# Patient Record
Sex: Female | Born: 1940
Health system: Southern US, Community
[De-identification: ages and names within clinical notes are randomized; demographics above are authoritative.]

## PROBLEM LIST (undated history)

## (undated) DIAGNOSIS — E119 Type 2 diabetes mellitus without complications: Secondary | ICD-10-CM

## (undated) DIAGNOSIS — K219 Gastro-esophageal reflux disease without esophagitis: Secondary | ICD-10-CM

## (undated) DIAGNOSIS — I10 Essential (primary) hypertension: Secondary | ICD-10-CM

## (undated) HISTORY — PX: ABDOMINAL HYSTERECTOMY: SHX81

## (undated) HISTORY — PX: OTHER SURGICAL HISTORY: SHX169

## (undated) HISTORY — PX: CHOLECYSTECTOMY: SHX55

## (undated) HISTORY — DX: Type 2 diabetes mellitus without complications: E11.9

## (undated) HISTORY — DX: Gastro-esophageal reflux disease without esophagitis: K21.9

---

## 2005-01-28 ENCOUNTER — Ambulatory Visit: Payer: Self-pay | Admitting: Orthopedic Surgery

## 2005-07-14 ENCOUNTER — Ambulatory Visit: Payer: Self-pay | Admitting: Orthopedic Surgery

## 2007-10-30 ENCOUNTER — Ambulatory Visit: Payer: Self-pay | Admitting: Orthopedic Surgery

## 2007-10-30 DIAGNOSIS — M25519 Pain in unspecified shoulder: Secondary | ICD-10-CM

## 2008-01-01 ENCOUNTER — Ambulatory Visit: Payer: Self-pay | Admitting: Orthopedic Surgery

## 2012-07-12 ENCOUNTER — Ambulatory Visit (INDEPENDENT_AMBULATORY_CARE_PROVIDER_SITE_OTHER): Payer: Medicare Other | Admitting: Orthopedic Surgery

## 2012-07-12 ENCOUNTER — Encounter: Payer: Self-pay | Admitting: Orthopedic Surgery

## 2012-07-12 VITALS — BP 110/58 | Ht 65.0 in | Wt 174.2 lb

## 2012-07-12 DIAGNOSIS — Z8739 Personal history of other diseases of the musculoskeletal system and connective tissue: Secondary | ICD-10-CM

## 2012-07-12 DIAGNOSIS — M67919 Unspecified disorder of synovium and tendon, unspecified shoulder: Secondary | ICD-10-CM

## 2012-07-12 DIAGNOSIS — M7580 Other shoulder lesions, unspecified shoulder: Secondary | ICD-10-CM

## 2012-07-12 MED ORDER — HYDROCODONE-ACETAMINOPHEN 5-325 MG PO TABS: 1.0000 | ORAL_TABLET | ORAL | Status: DC | PRN

## 2012-07-12 NOTE — Patient Instructions (Addendum)
You have received a steroid shot. 15% of patients experience increased pain at the injection site with in the next 24 hours. This is best treated with ice and tylenol extra strength 2 tabs every 8 hours. If you are still having pain please call the office.   Start exercise in 1 week

## 2012-07-12 NOTE — Progress Notes (Signed)
  Subjective:    Kathryn [name] is a 72 y.o. female who presents with bilateral shoulder pain. The symptoms began several weeks ago. Aggravating factors: no known event. Pain is located diffusely throughout the shoulder. Discomfort is described as sharp/stabbing and throbbing. Symptoms are exacerbated by repetitive movements, overhead movements and lying on the shoulder. Evaluation to date: none. Therapy to date includes: nothing specific.  The following portions of the patient's history were reviewed and updated as appropriate: allergies, current medications, past family history, past medical history, past social history, past surgical history and problem list.  Review of Systems A comprehensive review of systems was negative except for: cough and heartburn    Objective:    BP 110/58  Ht 5\' 5"  (1.651 m)  Wt 174 lb 3.2 oz (79.017 kg)  BMI 28.99 kg/m2 Physical Exam(12) GENERAL: normal development   CDV: pulses are normal   Skin: normal  Lymph: nodes were not palpable/normal  Psychiatric: awake, alert and oriented  Neuro: normal sensation  RIGHT shoulder, a full passive and active range of motion with painful forward elevation. Rotator cuff intact. Shoulder stable. Skin intact.  LEFT shoulder decreased internal rotation, decreased external rotation, forward elevation, active only 100, painful impingement sign bilaterally, LEFT shoulder, stable cuff intact on the LEFT as well. Skin intact.  Normal ambulation without balance disturbance. No pathologic reflexes  Assessment:    Bilateral rotator cuff tendinitis    Plan:    Natural history and expected course discussed. Questions answered. Gentle ROM exercises. Shoulder injection. See procedure note.  Subacromial Shoulder Injection Procedure Note  Shoulder Injection Procedure Note   Pre-operative Diagnosis: right  RC Syndrome  Post-operative Diagnosis: same  Indications: pain   Anesthesia: ethyl chloride   Procedure  Details   Verbal consent was obtained for the procedure. The shoulder was prepped withalcohol and the skin was anesthetized. A 20 gauge needle was advanced into the subacromial space through posterior approach without difficulty  The space was then injected with 3 ml 1% lidocaine and 1 ml of depomedrol. The injection site was cleansed with isopropyl alcohol and a dressing was applied.  Complications:  None; patient tolerated the procedure well.  Subacromial Shoulder Injection Procedure Note  Pre-operative Diagnosis: left RC Syndrome  Post-operative Diagnosis: same  Indications: pain   Anesthesia: ethyl chloride   Procedure Details   Verbal consent was obtained for the procedure. The shoulder was prepped withalcohol and the skin was anesthetized. A 20 gauge needle was advanced into the subacromial space through posterior approach without difficulty  The space was then injected with 3 ml 1% lidocaine and 1 ml of depomedrol. The injection site was cleansed with isopropyl alcohol and a dressing was applied.  Complications:  None; patient tolerated the procedure well.

## 2014-12-04 DIAGNOSIS — H11823 Conjunctivochalasis, bilateral: Secondary | ICD-10-CM | POA: Insufficient documentation

## 2014-12-04 DIAGNOSIS — H2513 Age-related nuclear cataract, bilateral: Secondary | ICD-10-CM | POA: Insufficient documentation

## 2015-04-25 DIAGNOSIS — E8809 Other disorders of plasma-protein metabolism, not elsewhere classified: Secondary | ICD-10-CM | POA: Insufficient documentation

## 2015-04-27 DIAGNOSIS — Z808 Family history of malignant neoplasm of other organs or systems: Secondary | ICD-10-CM | POA: Insufficient documentation

## 2015-04-27 DIAGNOSIS — Z800 Family history of malignant neoplasm of digestive organs: Secondary | ICD-10-CM | POA: Insufficient documentation

## 2015-06-26 ENCOUNTER — Ambulatory Visit (INDEPENDENT_AMBULATORY_CARE_PROVIDER_SITE_OTHER): Payer: Medicare Other | Admitting: Orthopedic Surgery

## 2015-06-26 ENCOUNTER — Ambulatory Visit: Payer: Medicare Other

## 2015-06-26 ENCOUNTER — Ambulatory Visit (INDEPENDENT_AMBULATORY_CARE_PROVIDER_SITE_OTHER): Payer: Medicare Other

## 2015-06-26 VITALS — BP 135/87 | Ht 65.0 in | Wt 129.0 lb

## 2015-06-26 DIAGNOSIS — M25512 Pain in left shoulder: Secondary | ICD-10-CM

## 2015-06-26 DIAGNOSIS — M25511 Pain in right shoulder: Secondary | ICD-10-CM | POA: Diagnosis not present

## 2015-06-26 DIAGNOSIS — M7502 Adhesive capsulitis of left shoulder: Secondary | ICD-10-CM | POA: Diagnosis not present

## 2015-06-26 DIAGNOSIS — M7501 Adhesive capsulitis of right shoulder: Secondary | ICD-10-CM | POA: Diagnosis not present

## 2015-06-26 MED ORDER — TRAMADOL HCL 50 MG PO TABS: 50.0000 mg | ORAL_TABLET | Freq: Four times a day (QID) | ORAL | Status: DC | PRN

## 2015-06-26 NOTE — Patient Instructions (Addendum)
Call [facility] to schedule therapy visits  Joint Injection Care After Refer to this sheet in the next few days. These instructions provide you with information on caring for yourself after you have had a joint injection. Your caregiver also may give you more specific instructions. Your treatment has been planned according to current medical practices, but problems sometimes occur. Call your caregiver if you have any problems or questions after your procedure. After any type of joint injection, it is not uncommon to experience:  Soreness, swelling, or bruising around the injection site.  Mild numbness, tingling, or weakness around the injection site caused by the numbing medicine used before or with the injection. It also is possible to experience the following effects associated with the specific agent after injection:  Iodine-based contrast agents:  Allergic reaction (itching, hives, widespread redness, and swelling beyond the injection site).  Corticosteroids (These effects are rare.):  Allergic reaction.  Increased blood sugar levels (If you have diabetes and you notice that your blood sugar levels have increased, notify your caregiver).  Increased blood pressure levels.  Mood swings.  Hyaluronic acid in the use of viscosupplementation.  Temporary heat or redness.  Temporary rash and itching.  Increased fluid accumulation in the injected joint. These effects all should resolve within a day after your procedure.  HOME CARE INSTRUCTIONS  Limit yourself to light activity the day of your procedure. Avoid lifting heavy objects, bending, stooping, or twisting.  Take prescription or over-the-counter pain medication as directed by your caregiver.  You may apply ice to your injection site to reduce pain and swelling the day of your procedure. Ice may be applied 03-04 times:  Put ice in a plastic bag.  Place a towel between your skin and the bag.  Leave the ice on for no  longer than 15-20 minutes each time. SEEK IMMEDIATE MEDICAL CARE IF:   Pain and swelling get worse rather than better or extend beyond the injection site.  Numbness does not go away.  Blood or fluid continues to leak from the injection site.  You have chest pain.  You have swelling of your face or tongue.  You have trouble breathing or you become dizzy.  You develop a fever, chills, or severe tenderness at the injection site that last longer than 1 day. MAKE SURE YOU:  Understand these instructions.  Watch your condition.  Get help right away if you are not doing well or if you get worse. Document Released: 02/25/2011 Document Revised: 09/06/2011 Document Reviewed: 02/25/2011 [facility] Patient Information 2015 [place], [place]. This information is not intended to replace advice given to you by your health care provider. Make sure you discuss any questions you have with your health care provider.

## 2015-06-26 NOTE — Progress Notes (Signed)
Chief Complaint  Patient presents with  . Shoulder Pain    bilateral shoulder pain    74 years old female bilateral shoulder pain I saw her back in January we injected one of her shoulder she presents back with stiffness pains loss of motion no trauma with increased pain with overhead activity  Review of systems negative neuro symptoms negative fever negative chills negative other musculoskeletal complaints  BP 135/87 mmHg  Ht 5\' 5"  (1.651 m)  Wt 129 lb (58.514 kg)  BMI 21.47 kg/m2 She is awake alert and oriented 3 today her mood and affect are normal she is well-groomed. She walks and normally. Bilateral shoulders are examined  She has limited flexion of 110 active and passive she has limited external rotation 30 active and passive both shoulders remain stable-no weakness in the rotator cuff she does have crepitance and tenderness around the left shoulder as well as tenderness around the right shoulder  Both arms and the skin is normal with good pulses and normal sensation  X-rays of both shoulders show no fracture dislocation or bony abnormality other than some spurring around the acromion and perhaps a mild before meals joint arthritis  Impression adhesive capsulitis with possible impingement syndrome  Injectables shoulders and the therapy follow-up 7 weeks  Procedure note the subacromial injection shoulder left   Verbal consent was obtained to inject the  Left   Shoulder  Timeout was completed to confirm the injection site is a subacromial space of the  left  shoulder  Medication used Depo-Medrol 40 mg and lidocaine 1% 3 cc  Anesthesia was provided by ethyl chloride  The injection was performed in the left  posterior subacromial space. After pinning the skin with alcohol and anesthetized the skin with ethyl chloride the subacromial space was injected using a 20-gauge needle. There were no complications  Sterile dressing was applied.   Procedure note the subacromial  injection shoulder RIGHT  Verbal consent was obtained to inject the  RIGHT   Shoulder  Timeout was completed to confirm the injection site is a subacromial space of the  RIGHT  shoulder   Medication used Depo-Medrol 40 mg and lidocaine 1% 3 cc  Anesthesia was provided by ethyl chloride  The injection was performed in the RIGHT  posterior subacromial space. After pinning the skin with alcohol and anesthetized the skin with ethyl chloride the subacromial space was injected using a 20-gauge needle. There were no complications  Sterile dressing was applied.

## 2015-08-14 ENCOUNTER — Ambulatory Visit: Payer: Medicare Other | Admitting: Orthopedic Surgery

## 2015-08-18 ENCOUNTER — Ambulatory Visit (INDEPENDENT_AMBULATORY_CARE_PROVIDER_SITE_OTHER): Payer: Medicare Other | Admitting: Orthopedic Surgery

## 2015-08-18 ENCOUNTER — Encounter: Payer: Self-pay | Admitting: Orthopedic Surgery

## 2015-08-18 VITALS — BP 152/78 | Ht 65.0 in | Wt 133.0 lb

## 2015-08-18 DIAGNOSIS — M7501 Adhesive capsulitis of right shoulder: Secondary | ICD-10-CM

## 2015-08-18 DIAGNOSIS — M25511 Pain in right shoulder: Secondary | ICD-10-CM

## 2015-08-18 DIAGNOSIS — M25512 Pain in left shoulder: Secondary | ICD-10-CM | POA: Diagnosis not present

## 2015-08-18 DIAGNOSIS — M75101 Unspecified rotator cuff tear or rupture of right shoulder, not specified as traumatic: Secondary | ICD-10-CM | POA: Diagnosis not present

## 2015-08-18 MED ORDER — DIAZEPAM 10 MG PO TABS: ORAL_TABLET | ORAL | Status: DC

## 2015-08-18 NOTE — Patient Instructions (Signed)
We will schedule MRI for you and call you with appointment 

## 2015-08-18 NOTE — Addendum Note (Signed)
Addended by: Diamantina Monks on: 08/18/2015 02:44 PM   Modules accepted: Orders

## 2015-08-18 NOTE — Progress Notes (Signed)
Patient ID: Kathryn [name], female   DOB: 26-Mar-1941, 75 y.o.   MRN: [id number]  Chief Complaint  Patient presents with  . Follow-up    follow up bilateral shoulders s/p therapy    BP 152/78 mmHg  Ht  (1.651 m)  Wt 133 lb (60.328 kg)  BMI 22.13 kg/m2  Bilateral shoulder pain with impingement and possible adhesive capsulitis status post physical therapy both shoulders. Left shoulder responded very well right shoulder still stiff and painful.  Review of Systems  Constitutional: Negative for fever and chills.  Neurological: Negative for tingling.    Reexamination of the left shoulder shows 120 of elevation still has only 35 external rotation but on the right were limited to 0 external rotation and she has to assist the right arm to get it above her head    ASSESSMENT AND PLAN   Probable rotator cuff tear recommend MRI right shoulder with some Valium prior to MRI to control her mild claustrophobia

## 2015-08-19 ENCOUNTER — Telehealth: Payer: Self-pay | Admitting: *Deleted

## 2015-08-19 NOTE — Telephone Encounter (Signed)
SPOKE WITH [employer]- NO AUTH REQUIRED PER DEBRA O. CASE # [id number]  MRI 08/29/15  5:45P @ [facility]  FOLLOW UP IN OFFICE 09/05/15 8:50A  PATIENT AWARE OF DATES

## 2015-08-29 ENCOUNTER — Ambulatory Visit (HOSPITAL_COMMUNITY)
Admission: RE | Admit: 2015-08-29 | Discharge: 2015-08-29 | Disposition: A | Discharge status: Home / Self Care | Payer: Medicare Other | Source: Ambulatory Visit | Attending: Orthopedic Surgery | Admitting: Orthopedic Surgery

## 2015-08-29 DIAGNOSIS — M62511 Muscle wasting and atrophy, not elsewhere classified, right shoulder: Secondary | ICD-10-CM | POA: Insufficient documentation

## 2015-08-29 DIAGNOSIS — M75101 Unspecified rotator cuff tear or rupture of right shoulder, not specified as traumatic: Secondary | ICD-10-CM | POA: Insufficient documentation

## 2015-09-05 ENCOUNTER — Ambulatory Visit (INDEPENDENT_AMBULATORY_CARE_PROVIDER_SITE_OTHER): Payer: Medicare Other | Admitting: Orthopedic Surgery

## 2015-09-05 VITALS — BP 139/81

## 2015-09-05 DIAGNOSIS — M75101 Unspecified rotator cuff tear or rupture of right shoulder, not specified as traumatic: Secondary | ICD-10-CM | POA: Diagnosis not present

## 2015-09-05 MED ORDER — TRAMADOL-ACETAMINOPHEN 37.5-325 MG PO TABS: 1.0000 | ORAL_TABLET | ORAL | Status: DC | PRN

## 2015-09-05 NOTE — Progress Notes (Signed)
Status post MRI right shoulder  MRI has been reviewed and is report reviewed together  Patient has weakness and pain is due to paralysis of the right shoulder but has shoulder flexion 120  Review of systems no neurologic symptoms or neck pain  Currently in therapy at their a sports with some improvement on Ultram  BP 139/81 mmHg 5 foot 5 inches 133 pounds  Awake alert and oriented 3. Mood affect normal. Gait station normal.  No swelling or tenderness around the shoulder flexion 120 stable shoulder strength weakness in the rotator cuff tendon super spinatus 4-5 skin intact neurovascular exam normal.  MRI shows a repairable rotator cuff tear  IMPRESSION: 1. Large full-thickness retracted rotator cuff tear. 2. Diffuse and fairly marked fatty atrophy of the supraspinatus and infraspinatus muscles. 3. Intact Lenoir head biceps tendon and grossly normal glenoid labrum. 4. Moderate to advanced AC joint degenerative changes with mild lateral downsloping and undersurface spurring change from the acromion. 5. Moderate glenohumeral joint degenerative changes. 6. Diffuse edema like signal changes in the shoulder musculature may represent some type of diffuse myositis such as polymyositis or medication induced myositis.     Electronically Signed   By: Rudie MeyerP.  Gallerani M.D.   On: 08/29/2015 18:12   Plan is for continued physical therapy 1 month  Come back for reevaluation  Switch from all tramadol Ultracet

## 2015-09-05 NOTE — Patient Instructions (Signed)
[facility]- [phone]

## 2015-10-13 ENCOUNTER — Ambulatory Visit (INDEPENDENT_AMBULATORY_CARE_PROVIDER_SITE_OTHER): Payer: Medicare Other | Admitting: Orthopedic Surgery

## 2015-10-13 VITALS — BP 162/77 | HR 84 | Ht 64.0 in | Wt 133.0 lb

## 2015-10-13 DIAGNOSIS — M75101 Unspecified rotator cuff tear or rupture of right shoulder, not specified as traumatic: Secondary | ICD-10-CM | POA: Diagnosis not present

## 2015-10-13 NOTE — Progress Notes (Signed)
Patient ID: Kathryn PlantsShirley M Wesche, female   DOB: 04-19-1941, 75 y.o.   MRN: [id number]  Chief Complaint  Patient presents with  . Follow-up    Rght shoulder s/p therapy    HPI-Status post MRI right shoulder  MRI has been reviewed and is report reviewed together  Patient has weakness and pain is due to paralysis of the right shoulder but has shoulder flexion 120  The patient is not having any significant pain still has weakness but can still elevate her arm to get her hand to the top of her head and she thinks that the therapy is making things a lot better  ROS Review of systems no neurologic symptoms or neck pain  BP 162/77 mmHg  Pulse 84  Ht 5\' 4"  (1.626 m)  Wt 133 lb (60.328 kg)  BMI 22.82 kg/m2  Physical Exam  Constitutional: She is oriented to person, place, and time. She appears well-developed and well-nourished. No distress.  Cardiovascular: Normal rate and intact distal pulses.   Neurological: She is alert and oriented to person, place, and time. She has normal reflexes. She exhibits normal muscle tone. Coordination normal.  Skin: Skin is warm and dry. No rash noted. She is not diaphoretic. No erythema. No pallor.  Psychiatric: She has a normal mood and affect. Her behavior is normal. Judgment and thought content normal.    Ortho Exam  Weakness of supraspinatus tendon in abduction as well as flexion but flexion actively is 120. No instability is noted.  ASSESSMENT AND PLAN   The patient does not request any other intervention she's happy with her shoulder the weight is at the moment and she will follow-up with us as needed after completing her physical therapy

## 2016-03-02 DIAGNOSIS — H903 Sensorineural hearing loss, bilateral: Secondary | ICD-10-CM | POA: Insufficient documentation

## 2016-12-08 ENCOUNTER — Encounter (INDEPENDENT_AMBULATORY_CARE_PROVIDER_SITE_OTHER): Payer: Self-pay | Admitting: Orthopaedic Surgery

## 2016-12-08 ENCOUNTER — Ambulatory Visit (INDEPENDENT_AMBULATORY_CARE_PROVIDER_SITE_OTHER): Payer: Medicare Other | Admitting: Orthopaedic Surgery

## 2016-12-08 VITALS — BP 150/96 | HR 86 | Ht 63.0 in | Wt 170.0 lb

## 2016-12-08 DIAGNOSIS — M65332 Trigger finger, left middle finger: Secondary | ICD-10-CM | POA: Diagnosis not present

## 2016-12-08 MED ORDER — METHYLPREDNISOLONE ACETATE 40 MG/ML IJ SUSP
20.0000 mg | INTRAMUSCULAR | Status: AC | PRN
  Administered 2016-12-08: 20 mg

## 2016-12-08 MED ORDER — LIDOCAINE HCL (PF) 1 % IJ SOLN
0.5000 mL | INTRAMUSCULAR | Status: AC | PRN
  Administered 2016-12-08: .5 mL

## 2016-12-08 NOTE — Progress Notes (Signed)
Office Visit Note   Patient: Kathryn [name]           Date of Birth: 1941-02-14           MRN: [id number] Visit Date: 12/08/2016              Requested by: Kathryn [name], Kathryn B., MD [address] [place], [place] [id number] PCP: Kathryn [name], Kathryn B., MD   Assessment & Plan: Visit Diagnoses:  1. Trigger finger, left middle finger     Plan: Discussed treatment options including cortisone injection and surgery. Kathryn [name] would like to try the injection first with splinting. Return to office next 4-6 weeks if no improvement or with recurrence  Follow-Up Instructions: Return if symptoms worsen or fail to improve.   Orders:  No orders of the defined types were placed in this encounter.  No orders of the defined types were placed in this encounter.     Procedures: Hand/UE Inj Date/Time: 12/08/2016 3:06 PM Performed by: Kathryn [name], Kathryn [name] Authorized by: Kathryn [name], Kathryn [name]   Consent Given by:  Patient Indications:  Pain Condition: trigger finger   Location:  Kathryn [name] finger Site:  L Kathryn [name] Needle Size:  27 G Approach:  Volar Ultrasound Guidance: No   Medications:  0.5 mL lidocaine (PF) 1 %; 20 mg methylPREDNISolone acetate 40 MG/ML     Clinical Data: No additional findings.   Subjective: No chief complaint on file. Kathryn [name] has been experiencing triggering of both right and left Godwin fingers over period of several months. She is presently not having much trouble on the right but continues to have an issue on the left. The finger will catch when she wakes up in the morning with a painful area on the palmar aspect of her hand near the metacarpal phalangeal joint. She denies numbness or tingling or injury or trauma  HPI  Review of Systems   Objective: Vital Signs: BP (!) 150/96   Pulse 86   Ht 5\' 3"  (1.6 m)   Wt 170 lb (77.1 kg)   BMI 30.11 kg/m   Physical Exam  Ortho Exam left hand without any swelling. Skin intact. Neurovascular exam intact. No carpal tunnel symptoms.  Kathryn [name] nodule on the palmar aspect of the hand over the metacarpal phalangeal joint of the Kathryn [name] finger. Patient is able to actively The finger consistent with trigger finger.  Specialty Comments:  No specialty comments available.  Imaging: No results found.   PMFS History: Patient Active Problem List   Diagnosis Date Noted  . H/O rotator cuff syndrome 07/12/2012  . Rotator cuff tendinitis 07/12/2012  . SHOULDER PAIN 10/30/2007   Past Medical History:  Diagnosis Date  . Acid reflux   . Diabetes (HCC)     Family History  Problem Relation Age of Onset  . Cancer Unknown   . Diabetes Unknown   . Arthritis Unknown     Past Surgical History:  Procedure Laterality Date  . CHOLECYSTECTOMY    . Histosalpingogram     Social History   Occupational History  . Not on file.   Social History Main Topics  . Smoking status: Never Smoker  . Smokeless tobacco: Never Used  . Alcohol use No  . Drug use: No  . Sexual activity: No     Kathryn BatmanPeter [name] Gracey Tolle, MD   Note - This record has been created using [employer] software.  Chart creation errors have been sought, but may not always  have been located. Such creation  errors do not reflect on  the standard of medical care.

## 2017-08-03 ENCOUNTER — Ambulatory Visit (INDEPENDENT_AMBULATORY_CARE_PROVIDER_SITE_OTHER): Payer: Medicare Other | Admitting: Orthopaedic Surgery

## 2017-08-03 ENCOUNTER — Encounter (INDEPENDENT_AMBULATORY_CARE_PROVIDER_SITE_OTHER): Payer: Self-pay | Admitting: Orthopaedic Surgery

## 2017-08-03 VITALS — BP 161/88 | HR 85 | Resp 16 | Ht 64.0 in | Wt 170.0 lb

## 2017-08-03 DIAGNOSIS — M79642 Pain in left hand: Secondary | ICD-10-CM

## 2017-08-03 MED ORDER — METHYLPREDNISOLONE ACETATE 40 MG/ML IJ SUSP
20.0000 mg | INTRAMUSCULAR | Status: AC | PRN
  Administered 2017-08-03: 20 mg

## 2017-08-03 MED ORDER — LIDOCAINE HCL 1 % IJ SOLN
0.5000 mL | INTRAMUSCULAR | Status: AC | PRN
  Administered 2017-08-03: .5 mL

## 2017-08-03 NOTE — Progress Notes (Signed)
Office Visit Note   Patient: Kathryn [name]           Date of Birth: [birth year]           MRN: [id number] Visit Date: 08/03/2017              Requested by: Louie Bostonapper, David B., MD [address] [place], [place] [id number] PCP: Louie Bostonapper, David B., MD   Assessment & Plan: Visit Diagnoses:  1. Pain of left hand     Plan: Recurrent triggering left Morado finger. We will reinject. Kathryn [name] is not interested in surgery  Follow-Up Instructions: Return if symptoms worsen or fail to improve.   Orders:  Orders Placed This Encounter  Procedures  . Hand/UE Inj: L Olivar A1   No orders of the defined types were placed in this encounter.     Procedures: Hand/UE Inj: L Cassaro A1 for trigger finger on 08/03/2017 10:05 AM Details: 27 G needle, volar approach Medications: 0.5 mL lidocaine 1 %; 20 mg methylPREDNISolone acetate 40 MG/ML      Clinical Data: No additional findings.   Subjective: Chief Complaint  Patient presents with  . Left Hand - Pain    Kathryn [name] has been experiencing triggering of L Dudziak finger X 1 month. Injection in 11/2016 In the am wakes with a painful area on the palmar aspect of her hand near the metacarpal phalangeal joint  Trigger finger injection same finger in July "made a big difference". Has had recurrent triggering  HPI  Review of Systems  Constitutional: Negative for chills, fatigue and fever.  Eyes: Negative for itching.  Respiratory: Negative for chest tightness and shortness of breath.   Cardiovascular: Negative for chest pain, palpitations and leg swelling.  Gastrointestinal: Negative for blood in stool, constipation and diarrhea.  Endocrine: Negative for polyuria.  Genitourinary: Negative for dysuria.  Musculoskeletal: Positive for joint swelling. Negative for back pain, neck pain and neck stiffness.  Allergic/Immunologic: Negative for immunocompromised state.  Neurological: Negative for dizziness and numbness.  Hematological: Does not bruise/bleed  easily.  Psychiatric/Behavioral: The patient is not nervous/anxious.      Objective: Vital Signs: BP (!) 161/88   Pulse 85   Resp 16   Ht 5\' 4"  (1.626 m)   Wt 170 lb (77.1 kg)   BMI 29.18 kg/m   Physical Exam  Ortho Exam awake alert and oriented 3. Comfortable sitting. Exam of the left Dack finger demonstrates a painful nodule on the palmar aspect near the metacarpal phalangeal joint. She could not actively trigger the finger. No swelling of the digit. Neurovascular exam intact. Full extension across the IP joint and MP joint.  Specialty Comments:  No specialty comments available.  Imaging: No results found.   PMFS History: Patient Active Problem List   Diagnosis Date Noted  . H/O rotator cuff syndrome 07/12/2012  . Rotator cuff tendinitis 07/12/2012  . SHOULDER PAIN 10/30/2007   Past Medical History:  Diagnosis Date  . Acid reflux   . Diabetes (HCC)     Family History  Problem Relation Age of Onset  . Cancer Unknown   . Diabetes Unknown   . Arthritis Unknown     Past Surgical History:  Procedure Laterality Date  . CHOLECYSTECTOMY    . Histosalpingogram     Social History   Occupational History  . Not on file  Tobacco Use  . Smoking status: Never Smoker  . Smokeless tobacco: Never Used  Substance and Sexual Activity  .  Alcohol use: No  . Drug use: No  . Sexual activity: No

## 2018-03-11 DIAGNOSIS — B370 Candidal stomatitis: Secondary | ICD-10-CM | POA: Diagnosis not present

## 2018-03-11 DIAGNOSIS — Z79899 Other long term (current) drug therapy: Secondary | ICD-10-CM | POA: Diagnosis not present

## 2018-03-11 DIAGNOSIS — N390 Urinary tract infection, site not specified: Secondary | ICD-10-CM | POA: Diagnosis not present

## 2018-03-11 DIAGNOSIS — N19 Unspecified kidney failure: Secondary | ICD-10-CM | POA: Diagnosis not present

## 2018-03-11 DIAGNOSIS — E878 Other disorders of electrolyte and fluid balance, not elsewhere classified: Secondary | ICD-10-CM | POA: Diagnosis not present

## 2018-03-11 DIAGNOSIS — K565 Intestinal adhesions [bands], unspecified as to partial versus complete obstruction: Secondary | ICD-10-CM | POA: Diagnosis not present

## 2018-03-11 DIAGNOSIS — Z7982 Long term (current) use of aspirin: Secondary | ICD-10-CM | POA: Diagnosis not present

## 2018-03-11 DIAGNOSIS — N179 Acute kidney failure, unspecified: Secondary | ICD-10-CM | POA: Diagnosis not present

## 2018-03-11 DIAGNOSIS — Z7984 Long term (current) use of oral hypoglycemic drugs: Secondary | ICD-10-CM | POA: Diagnosis not present

## 2018-03-11 DIAGNOSIS — E1129 Type 2 diabetes mellitus with other diabetic kidney complication: Secondary | ICD-10-CM | POA: Diagnosis not present

## 2018-03-11 DIAGNOSIS — E119 Type 2 diabetes mellitus without complications: Secondary | ICD-10-CM | POA: Diagnosis not present

## 2018-03-11 DIAGNOSIS — K56609 Unspecified intestinal obstruction, unspecified as to partial versus complete obstruction: Secondary | ICD-10-CM | POA: Diagnosis not present

## 2018-03-11 DIAGNOSIS — E039 Hypothyroidism, unspecified: Secondary | ICD-10-CM | POA: Diagnosis not present

## 2018-03-11 DIAGNOSIS — J208 Acute bronchitis due to other specified organisms: Secondary | ICD-10-CM | POA: Diagnosis not present

## 2018-03-11 DIAGNOSIS — J9811 Atelectasis: Secondary | ICD-10-CM | POA: Diagnosis not present

## 2018-03-11 DIAGNOSIS — E860 Dehydration: Secondary | ICD-10-CM | POA: Diagnosis not present

## 2018-03-11 DIAGNOSIS — E871 Hypo-osmolality and hyponatremia: Secondary | ICD-10-CM | POA: Diagnosis not present

## 2018-03-11 DIAGNOSIS — B961 Klebsiella pneumoniae [K. pneumoniae] as the cause of diseases classified elsewhere: Secondary | ICD-10-CM | POA: Diagnosis not present

## 2018-03-11 DIAGNOSIS — R109 Unspecified abdominal pain: Secondary | ICD-10-CM | POA: Diagnosis not present

## 2018-03-11 DIAGNOSIS — K5651 Intestinal adhesions [bands], with partial obstruction: Secondary | ICD-10-CM | POA: Diagnosis not present

## 2018-03-11 DIAGNOSIS — I10 Essential (primary) hypertension: Secondary | ICD-10-CM | POA: Diagnosis not present

## 2018-03-11 DIAGNOSIS — K219 Gastro-esophageal reflux disease without esophagitis: Secondary | ICD-10-CM | POA: Diagnosis not present

## 2018-03-11 DIAGNOSIS — Z4682 Encounter for fitting and adjustment of non-vascular catheter: Secondary | ICD-10-CM | POA: Diagnosis not present

## 2018-03-24 DIAGNOSIS — I10 Essential (primary) hypertension: Secondary | ICD-10-CM | POA: Diagnosis not present

## 2018-03-24 DIAGNOSIS — E119 Type 2 diabetes mellitus without complications: Secondary | ICD-10-CM | POA: Diagnosis not present

## 2018-03-24 DIAGNOSIS — B370 Candidal stomatitis: Secondary | ICD-10-CM | POA: Diagnosis not present

## 2018-03-24 DIAGNOSIS — K56609 Unspecified intestinal obstruction, unspecified as to partial versus complete obstruction: Secondary | ICD-10-CM | POA: Diagnosis not present

## 2018-03-24 DIAGNOSIS — J209 Acute bronchitis, unspecified: Secondary | ICD-10-CM | POA: Diagnosis not present

## 2018-03-24 DIAGNOSIS — Z7982 Long term (current) use of aspirin: Secondary | ICD-10-CM | POA: Diagnosis not present

## 2018-03-24 DIAGNOSIS — Z48815 Encounter for surgical aftercare following surgery on the digestive system: Secondary | ICD-10-CM | POA: Diagnosis not present

## 2018-03-27 DIAGNOSIS — K56609 Unspecified intestinal obstruction, unspecified as to partial versus complete obstruction: Secondary | ICD-10-CM | POA: Diagnosis not present

## 2018-03-27 DIAGNOSIS — I10 Essential (primary) hypertension: Secondary | ICD-10-CM | POA: Diagnosis not present

## 2018-03-27 DIAGNOSIS — Z48815 Encounter for surgical aftercare following surgery on the digestive system: Secondary | ICD-10-CM | POA: Diagnosis not present

## 2018-03-27 DIAGNOSIS — J209 Acute bronchitis, unspecified: Secondary | ICD-10-CM | POA: Diagnosis not present

## 2018-03-27 DIAGNOSIS — Z7982 Long term (current) use of aspirin: Secondary | ICD-10-CM | POA: Diagnosis not present

## 2018-03-27 DIAGNOSIS — B370 Candidal stomatitis: Secondary | ICD-10-CM | POA: Diagnosis not present

## 2018-03-27 DIAGNOSIS — E119 Type 2 diabetes mellitus without complications: Secondary | ICD-10-CM | POA: Diagnosis not present

## 2018-03-30 DIAGNOSIS — B370 Candidal stomatitis: Secondary | ICD-10-CM | POA: Diagnosis not present

## 2018-03-30 DIAGNOSIS — Z5181 Encounter for therapeutic drug level monitoring: Secondary | ICD-10-CM | POA: Diagnosis not present

## 2018-03-30 DIAGNOSIS — Z8719 Personal history of other diseases of the digestive system: Secondary | ICD-10-CM | POA: Diagnosis not present

## 2018-03-30 DIAGNOSIS — R110 Nausea: Secondary | ICD-10-CM | POA: Diagnosis not present

## 2018-03-30 DIAGNOSIS — Z09 Encounter for follow-up examination after completed treatment for conditions other than malignant neoplasm: Secondary | ICD-10-CM | POA: Diagnosis not present

## 2018-03-31 DIAGNOSIS — K56609 Unspecified intestinal obstruction, unspecified as to partial versus complete obstruction: Secondary | ICD-10-CM | POA: Diagnosis not present

## 2018-03-31 DIAGNOSIS — Z48815 Encounter for surgical aftercare following surgery on the digestive system: Secondary | ICD-10-CM | POA: Diagnosis not present

## 2018-03-31 DIAGNOSIS — J209 Acute bronchitis, unspecified: Secondary | ICD-10-CM | POA: Diagnosis not present

## 2018-03-31 DIAGNOSIS — I10 Essential (primary) hypertension: Secondary | ICD-10-CM | POA: Diagnosis not present

## 2018-03-31 DIAGNOSIS — B370 Candidal stomatitis: Secondary | ICD-10-CM | POA: Diagnosis not present

## 2018-03-31 DIAGNOSIS — E119 Type 2 diabetes mellitus without complications: Secondary | ICD-10-CM | POA: Diagnosis not present

## 2018-03-31 DIAGNOSIS — Z7982 Long term (current) use of aspirin: Secondary | ICD-10-CM | POA: Diagnosis not present

## 2018-04-03 DIAGNOSIS — Z7982 Long term (current) use of aspirin: Secondary | ICD-10-CM | POA: Diagnosis not present

## 2018-04-03 DIAGNOSIS — E119 Type 2 diabetes mellitus without complications: Secondary | ICD-10-CM | POA: Diagnosis not present

## 2018-04-03 DIAGNOSIS — J209 Acute bronchitis, unspecified: Secondary | ICD-10-CM | POA: Diagnosis not present

## 2018-04-03 DIAGNOSIS — I10 Essential (primary) hypertension: Secondary | ICD-10-CM | POA: Diagnosis not present

## 2018-04-03 DIAGNOSIS — K56609 Unspecified intestinal obstruction, unspecified as to partial versus complete obstruction: Secondary | ICD-10-CM | POA: Diagnosis not present

## 2018-04-03 DIAGNOSIS — B370 Candidal stomatitis: Secondary | ICD-10-CM | POA: Diagnosis not present

## 2018-04-03 DIAGNOSIS — Z48815 Encounter for surgical aftercare following surgery on the digestive system: Secondary | ICD-10-CM | POA: Diagnosis not present

## 2018-04-12 DIAGNOSIS — Z23 Encounter for immunization: Secondary | ICD-10-CM | POA: Diagnosis not present

## 2018-04-12 DIAGNOSIS — E119 Type 2 diabetes mellitus without complications: Secondary | ICD-10-CM | POA: Diagnosis not present

## 2018-04-12 DIAGNOSIS — I10 Essential (primary) hypertension: Secondary | ICD-10-CM | POA: Diagnosis not present

## 2018-04-13 DIAGNOSIS — I10 Essential (primary) hypertension: Secondary | ICD-10-CM | POA: Diagnosis not present

## 2018-04-13 DIAGNOSIS — Z48815 Encounter for surgical aftercare following surgery on the digestive system: Secondary | ICD-10-CM | POA: Diagnosis not present

## 2018-04-13 DIAGNOSIS — K56609 Unspecified intestinal obstruction, unspecified as to partial versus complete obstruction: Secondary | ICD-10-CM | POA: Diagnosis not present

## 2018-04-13 DIAGNOSIS — E119 Type 2 diabetes mellitus without complications: Secondary | ICD-10-CM | POA: Diagnosis not present

## 2018-04-13 DIAGNOSIS — B370 Candidal stomatitis: Secondary | ICD-10-CM | POA: Diagnosis not present

## 2018-04-13 DIAGNOSIS — J209 Acute bronchitis, unspecified: Secondary | ICD-10-CM | POA: Diagnosis not present

## 2018-04-13 DIAGNOSIS — Z7982 Long term (current) use of aspirin: Secondary | ICD-10-CM | POA: Diagnosis not present

## 2018-04-13 DIAGNOSIS — R5383 Other fatigue: Secondary | ICD-10-CM | POA: Diagnosis not present

## 2018-05-15 DIAGNOSIS — E119 Type 2 diabetes mellitus without complications: Secondary | ICD-10-CM | POA: Diagnosis not present

## 2018-05-16 DIAGNOSIS — I10 Essential (primary) hypertension: Secondary | ICD-10-CM | POA: Diagnosis not present

## 2018-05-16 DIAGNOSIS — N179 Acute kidney failure, unspecified: Secondary | ICD-10-CM | POA: Diagnosis not present

## 2018-05-16 DIAGNOSIS — Z8719 Personal history of other diseases of the digestive system: Secondary | ICD-10-CM | POA: Diagnosis not present

## 2018-07-31 DIAGNOSIS — L309 Dermatitis, unspecified: Secondary | ICD-10-CM | POA: Diagnosis not present

## 2018-07-31 DIAGNOSIS — E119 Type 2 diabetes mellitus without complications: Secondary | ICD-10-CM | POA: Diagnosis not present

## 2018-07-31 DIAGNOSIS — I10 Essential (primary) hypertension: Secondary | ICD-10-CM | POA: Diagnosis not present

## 2018-09-08 DIAGNOSIS — H40013 Open angle with borderline findings, low risk, bilateral: Secondary | ICD-10-CM | POA: Diagnosis not present

## 2020-08-18 ENCOUNTER — Other Ambulatory Visit: Payer: Self-pay

## 2020-08-18 ENCOUNTER — Ambulatory Visit: Payer: Medicare Other | Attending: Family Medicine | Admitting: Audiologist

## 2020-08-18 DIAGNOSIS — H90A21 Sensorineural hearing loss, unilateral, right ear, with restricted hearing on the contralateral side: Secondary | ICD-10-CM | POA: Diagnosis present

## 2020-08-18 DIAGNOSIS — H9313 Tinnitus, bilateral: Secondary | ICD-10-CM | POA: Insufficient documentation

## 2020-08-18 DIAGNOSIS — H90A32 Mixed conductive and sensorineural hearing loss, unilateral, left ear with restricted hearing on the contralateral side: Secondary | ICD-10-CM | POA: Diagnosis present
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
## 2020-10-23 ENCOUNTER — Other Ambulatory Visit: Payer: Self-pay

## 2020-10-23 ENCOUNTER — Inpatient Hospital Stay (HOSPITAL_COMMUNITY)
Admission: EM | Admit: 2020-10-23 | Discharge: 2020-10-25 | DRG: 690 | Disposition: A | Discharge status: Home / Self Care | Payer: Medicare Other | Attending: Internal Medicine | Admitting: Internal Medicine

## 2020-10-23 ENCOUNTER — Emergency Department (HOSPITAL_COMMUNITY): Payer: Medicare Other

## 2020-10-23 ENCOUNTER — Encounter (HOSPITAL_COMMUNITY): Payer: Self-pay | Admitting: *Deleted

## 2020-10-23 DIAGNOSIS — Z800 Family history of malignant neoplasm of digestive organs: Secondary | ICD-10-CM

## 2020-10-23 DIAGNOSIS — E538 Deficiency of other specified B group vitamins: Secondary | ICD-10-CM | POA: Diagnosis present

## 2020-10-23 DIAGNOSIS — E1159 Type 2 diabetes mellitus with other circulatory complications: Secondary | ICD-10-CM | POA: Diagnosis present

## 2020-10-23 DIAGNOSIS — R531 Weakness: Secondary | ICD-10-CM

## 2020-10-23 DIAGNOSIS — R933 Abnormal findings on diagnostic imaging of other parts of digestive tract: Secondary | ICD-10-CM | POA: Diagnosis not present

## 2020-10-23 DIAGNOSIS — E441 Mild protein-calorie malnutrition: Secondary | ICD-10-CM | POA: Diagnosis present

## 2020-10-23 DIAGNOSIS — E119 Type 2 diabetes mellitus without complications: Secondary | ICD-10-CM | POA: Diagnosis present

## 2020-10-23 DIAGNOSIS — I10 Essential (primary) hypertension: Secondary | ICD-10-CM | POA: Diagnosis present

## 2020-10-23 DIAGNOSIS — Z888 Allergy status to other drugs, medicaments and biological substances status: Secondary | ICD-10-CM

## 2020-10-23 DIAGNOSIS — R1310 Dysphagia, unspecified: Secondary | ICD-10-CM | POA: Diagnosis present

## 2020-10-23 DIAGNOSIS — R6881 Early satiety: Secondary | ICD-10-CM | POA: Diagnosis present

## 2020-10-23 DIAGNOSIS — I152 Hypertension secondary to endocrine disorders: Secondary | ICD-10-CM | POA: Diagnosis present

## 2020-10-23 DIAGNOSIS — M6282 Rhabdomyolysis: Secondary | ICD-10-CM

## 2020-10-23 DIAGNOSIS — R000 Tachycardia, unspecified: Secondary | ICD-10-CM

## 2020-10-23 DIAGNOSIS — K449 Diaphragmatic hernia without obstruction or gangrene: Secondary | ICD-10-CM | POA: Diagnosis present

## 2020-10-23 DIAGNOSIS — D638 Anemia in other chronic diseases classified elsewhere: Secondary | ICD-10-CM | POA: Diagnosis present

## 2020-10-23 DIAGNOSIS — R634 Abnormal weight loss: Secondary | ICD-10-CM

## 2020-10-23 DIAGNOSIS — I2510 Atherosclerotic heart disease of native coronary artery without angina pectoris: Secondary | ICD-10-CM | POA: Diagnosis present

## 2020-10-23 DIAGNOSIS — Z79899 Other long term (current) drug therapy: Secondary | ICD-10-CM

## 2020-10-23 DIAGNOSIS — Z20822 Contact with and (suspected) exposure to covid-19: Secondary | ICD-10-CM | POA: Diagnosis present

## 2020-10-23 DIAGNOSIS — N390 Urinary tract infection, site not specified: Secondary | ICD-10-CM | POA: Diagnosis not present

## 2020-10-23 DIAGNOSIS — R7989 Other specified abnormal findings of blood chemistry: Secondary | ICD-10-CM | POA: Diagnosis not present

## 2020-10-23 DIAGNOSIS — Z6823 Body mass index (BMI) 23.0-23.9, adult: Secondary | ICD-10-CM

## 2020-10-23 DIAGNOSIS — R5383 Other fatigue: Secondary | ICD-10-CM

## 2020-10-23 DIAGNOSIS — Z7982 Long term (current) use of aspirin: Secondary | ICD-10-CM

## 2020-10-23 DIAGNOSIS — K219 Gastro-esophageal reflux disease without esophagitis: Secondary | ICD-10-CM | POA: Diagnosis present

## 2020-10-23 DIAGNOSIS — K220 Achalasia of cardia: Secondary | ICD-10-CM | POA: Diagnosis present

## 2020-10-23 HISTORY — DX: Essential (primary) hypertension: I10

## 2020-10-23 LAB — COMPREHENSIVE METABOLIC PANEL
ALT: 37 U/L (ref 0–44)
AST: 76 U/L — ABNORMAL HIGH (ref 15–41)
Albumin: 3.1 g/dL — ABNORMAL LOW (ref 3.5–5.0)
Alkaline Phosphatase: 83 U/L (ref 38–126)
Anion gap: 8 (ref 5–15)
BUN: 13 mg/dL (ref 8–23)
CO2: 24 mmol/L (ref 22–32)
Calcium: 9.2 mg/dL (ref 8.9–10.3)
Chloride: 101 mmol/L (ref 98–111)
Creatinine, Ser: 0.8 mg/dL (ref 0.44–1.00)
GFR, Estimated: >60 mL/min (ref >60)
Glucose, Bld: 111 mg/dL — ABNORMAL HIGH (ref 70–99)
Potassium: 4.2 mmol/L (ref 3.5–5.1)
Sodium: 133 mmol/L — ABNORMAL LOW (ref 135–145)
Total Bilirubin: 0.5 mg/dL (ref 0.3–1.2)
Total Protein: 8.1 g/dL (ref 6.5–8.1)

## 2020-10-23 LAB — SEDIMENTATION RATE: Sed Rate: 44 mm/hr — ABNORMAL HIGH (ref 0–22)

## 2020-10-23 LAB — URINALYSIS, ROUTINE W REFLEX MICROSCOPIC
Bilirubin Urine: NEGATIVE
Glucose, UA: NEGATIVE mg/dL
Hgb urine dipstick: NEGATIVE
Ketones, ur: 5 mg/dL — AB
Nitrite: NEGATIVE
Protein, ur: 30 mg/dL — AB
Specific Gravity, Urine: 1.015 (ref 1.005–1.030)
WBC, UA: >50 WBC/hpf — ABNORMAL HIGH (ref 0–5)
pH: 7 (ref 5.0–8.0)

## 2020-10-23 LAB — CBC WITH DIFFERENTIAL/PLATELET
Abs Immature Granulocytes: 0.02 10*3/uL (ref 0.00–0.07)
Basophils Absolute: 0 10*3/uL (ref 0.0–0.1)
Basophils Relative: 0 %
Eosinophils Absolute: 0.1 10*3/uL (ref 0.0–0.5)
Eosinophils Relative: 1 %
HCT: 34.4 % — ABNORMAL LOW (ref 36.0–46.0)
Hemoglobin: 11.4 g/dL — ABNORMAL LOW (ref 12.0–15.0)
Immature Granulocytes: 0 %
Lymphocytes Relative: 12 %
Lymphs Abs: 1 10*3/uL (ref 0.7–4.0)
MCH: 31.8 pg (ref 26.0–34.0)
MCHC: 33.1 g/dL (ref 30.0–36.0)
MCV: 96.1 fL (ref 80.0–100.0)
Monocytes Absolute: 0.3 10*3/uL (ref 0.1–1.0)
Monocytes Relative: 4 %
Neutro Abs: 6.5 10*3/uL (ref 1.7–7.7)
Neutrophils Relative %: 83 %
Platelets: 278 10*3/uL (ref 150–400)
RBC: 3.58 MIL/uL — ABNORMAL LOW (ref 3.87–5.11)
RDW: 13.7 % (ref 11.5–15.5)
WBC: 7.9 10*3/uL (ref 4.0–10.5)
nRBC: 0 % (ref 0.0–0.2)

## 2020-10-23 LAB — LACTIC ACID, PLASMA
Lactic Acid, Venous: 1.4 mmol/L (ref 0.5–1.9)
Lactic Acid, Venous: 1.1 mmol/L (ref 0.5–1.9)

## 2020-10-23 LAB — VITAMIN B12: Vitamin B-12: 590 pg/mL (ref 180–914)

## 2020-10-23 LAB — TROPONIN I (HIGH SENSITIVITY)
Troponin I (High Sensitivity): 5 ng/L (ref <18)
Troponin I (High Sensitivity): 6 ng/L (ref <18)

## 2020-10-23 LAB — D-DIMER, QUANTITATIVE: D-Dimer, Quant: 2.6 ug/mL-FEU — ABNORMAL HIGH (ref 0.00–0.50)

## 2020-10-23 LAB — RESP PANEL BY RT-PCR (FLU A&B, COVID) ARPGX2
Influenza A by PCR: NEGATIVE
Influenza B by PCR: NEGATIVE
SARS Coronavirus 2 by RT PCR: NEGATIVE

## 2020-10-23 LAB — CK: Total CK: 1206 U/L — ABNORMAL HIGH (ref 38–234)

## 2020-10-23 LAB — C-REACTIVE PROTEIN: CRP: 0.7 mg/dL (ref <1.0)

## 2020-10-23 LAB — TSH: TSH: 0.885 u[IU]/mL (ref 0.350–4.500)

## 2020-10-23 MED ORDER — ONDANSETRON HCL 4 MG PO TABS: 4.0000 mg | ORAL_TABLET | Freq: Four times a day (QID) | ORAL | Status: DC | PRN

## 2020-10-23 MED ORDER — OXYCODONE HCL 5 MG PO TABS: 5.0000 mg | ORAL_TABLET | ORAL | Status: DC | PRN

## 2020-10-23 MED ORDER — ACETAMINOPHEN 650 MG RE SUPP: 650.0000 mg | Freq: Four times a day (QID) | RECTAL | Status: DC | PRN

## 2020-10-23 MED ORDER — SODIUM CHLORIDE 0.9 % IV SOLN
1.0000 g | INTRAVENOUS | Status: DC
  Administered 2020-10-23 – 2020-10-24 (×2): 1 g via INTRAVENOUS
  Filled 2020-10-23 (×2): qty 10

## 2020-10-23 MED ORDER — ONDANSETRON HCL 4 MG/2ML IJ SOLN
4.0000 mg | Freq: Four times a day (QID) | INTRAMUSCULAR | Status: DC | PRN
Start: 2020-10-23 — End: 2020-10-25

## 2020-10-23 MED ORDER — ASPIRIN EC 81 MG PO TBEC
81.0000 mg | DELAYED_RELEASE_TABLET | Freq: Every day | ORAL | Status: DC
  Administered 2020-10-25: 81 mg via ORAL
  Filled 2020-10-23 (×2): qty 1

## 2020-10-23 MED ORDER — SODIUM CHLORIDE 0.9 % IV BOLUS
500.0000 mL | Freq: Once | INTRAVENOUS | Status: AC
  Administered 2020-10-23: 500 mL via INTRAVENOUS

## 2020-10-23 MED ORDER — LOSARTAN POTASSIUM 50 MG PO TABS
50.0000 mg | ORAL_TABLET | Freq: Every day | ORAL | Status: DC
  Administered 2020-10-24 – 2020-10-25 (×2): 50 mg via ORAL
  Filled 2020-10-23 (×3): qty 1

## 2020-10-23 MED ORDER — ACETAMINOPHEN 325 MG PO TABS: 650.0000 mg | ORAL_TABLET | Freq: Four times a day (QID) | ORAL | Status: DC | PRN

## 2020-10-23 MED ORDER — METOPROLOL TARTRATE 5 MG/5ML IV SOLN
5.0000 mg | Freq: Once | INTRAVENOUS | Status: AC
  Administered 2020-10-23: 5 mg via INTRAVENOUS
  Filled 2020-10-23: qty 5

## 2020-10-23 MED ORDER — HEPARIN SODIUM (PORCINE) 5000 UNIT/ML IJ SOLN
5000.0000 [IU] | Freq: Three times a day (TID) | INTRAMUSCULAR | Status: DC
  Administered 2020-10-23 – 2020-10-24 (×2): 5000 [IU] via SUBCUTANEOUS
  Filled 2020-10-23 (×2): qty 1

## 2020-10-23 MED ORDER — LACTATED RINGERS IV SOLN: INTRAVENOUS | Status: DC

## 2020-10-23 MED ORDER — PANTOPRAZOLE SODIUM 40 MG IV SOLR
40.0000 mg | Freq: Two times a day (BID) | INTRAVENOUS | Status: DC
  Administered 2020-10-23 – 2020-10-24 (×2): 40 mg via INTRAVENOUS
  Filled 2020-10-23 (×2): qty 40

## 2020-10-23 MED ORDER — IOHEXOL 350 MG/ML SOLN
75.0000 mL | Freq: Once | INTRAVENOUS | Status: AC | PRN
  Administered 2020-10-23: 75 mL via INTRAVENOUS

## 2020-10-23 NOTE — H&P (Signed)
TRH H&P    Patient Demographics:    Kathryn [name], is a 80 y.o. female  MRN: [id number]  DOB - [birth year]  Admit Date - 10/23/2020  Referring MD/NP/PA: Benjamine Mola  Outpatient Primary MD for the patient is Leeanne Rio, MD  Patient coming from: Home  Chief complaint- Generalized weakness   HPI:    Kathryn [name]  is a 79 y.o. female, with history of acid reflux, hypertension, and more presents the ED with a chief complaint of generalized weakness.  Patient reports that weakness and fatigue started about 2 weeks ago.  It was gradual in onset and constant until 4 days ago and started getting progressively worse.  Initially she reports no pain, but then reports that she has symmetric shoulder and hip pain that is worse with position changes.  She reports she has never had arthritic pain but she does have a torn rotator cuff on the right.  She thought maybe she was also developing a rotator cuff injury on the left.  Patient denies any fevers, chills, sweats.  She denies any dysuria or hematuria, but admits to urinary frequency and urgency.  Patient has had no falls at home and denies being down for any significant amount of time.  Patient reports that she is having no trouble swallowing, but does not have any appetite.  She has not had an appetite for 2 to 3 months per her report, and she believes that it is getting worse.  All she had today was a piece of bacon and a glass of milk.  Family had told the ER provider that she had a history of esophageal stricture, but patient reports that she does not remember if she did or not.  As per patient's weakness, she reports she is still been able to attend to her ADLs, but has been overly concerned that she might have a fall when she is trying to ambulate through the home secondary to the weakness.  She had no near syncope events.  Patient reports that she does not smoke, does not  drink, does not use illicit drugs.  She is vaccinated for COVID.  Would like to be limited code, DNI.  In the ED Temp 98.6, heart rate 10 6-1 18, respiratory rate 16-24, blood pressure 154/76, satting at 98% UA is indicative of UTI Blood culture ordered Urine culture ordered at admission D-dimer elevated, CTA shows no PE, evidence of pulmonary hypertension, possible esophagitis versus stricture? Chest x-ray shows no acute findings 500 mL bolus of NaCl, LR started 150 mL/h No leukocytosis, hemoglobin 11.4, chemistry panel shows a very slight hyponatremia 133 otherwise unremarkable Albumin mildly decreased at 3.1 CPK 1206 Vitamin B12 590 Troponin flat at 5 and 6 EKG shows a heart rate of 108, sinus tach, QTC 455 Admission requested for further work-up of this generalized weakness and rhabdo    Review of systems:    In addition to the HPI above,  No Fever-chills, No Headache, No changes with Vision or hearing, No problems swallowing food or Liquids, No Chest pain, Cough  or Shortness of Breath, No Abdominal pain, No Nausea or Vomiting, No Blood in stool or Urine, No dysuria, No new skin rashes or bruises, Admits to symmetric shoulder pain No new asymmetric weakness, tingling, numbness in any extremity, No recent weight gain or loss, No polyuria, polydypsia or polyphagia, No significant Mental Stressors.  All other systems reviewed and are negative.    Past History of the following :    Past Medical History:  Diagnosis Date  . Acid reflux   . Diabetes (HCC)       Past Surgical History:  Procedure Laterality Date  . CHOLECYSTECTOMY    . Histosalpingogram        Social History:      Social History   Tobacco Use  . Smoking status: Never Smoker  . Smokeless tobacco: Never Used  Substance Use Topics  . Alcohol use: No       Family History :     Family History  Problem Relation Age of Onset  . Cancer Other   . Diabetes Other   . Arthritis Other        Home Medications:   Prior to Admission medications   Medication Sig Start Date End Date Taking? Authorizing Provider  aspirin 81 MG tablet Take 81 mg by mouth daily.   Yes [provider]  losartan (COZAAR) 50 MG tablet Take 50 mg by mouth daily. 09/26/20  Yes [provider]  pantoprazole (PROTONIX) 40 MG tablet Take 40 mg by mouth daily.   Yes [provider]  triamcinolone ointment (KENALOG) 0.1 % Apply topically. 09/26/20  Yes [provider]     Allergies:     Allergies  Allergen Reactions  . Cyclobenzaprine Hcl Swelling     Physical Exam:   Vitals  Blood pressure (!) 154/76, pulse (!) 106, temperature 98.8 F (37.1 C), temperature source Oral, resp. rate 16, height 5' 3" (1.6 m), weight 59.9 kg, SpO2 98 %.  1.  General: Patient lying supine in bed in no acute distress  2. Psychiatric: Mood and behavior normal for situation, alert oriented to self, place, contacts, pleasant and cooperative with exam  3. Neurologic: Face is symmetric, speech and language are normal, moves all 4 extremities voluntarily, equal strength in upper and lower extremities bilaterally, no acute deficit on limited exam  4. HEENMT:  Head is atraumatic, no cephalic, pupils are active to light, neck is supple, trachea is midline, mucous membranes are mildly dry  5. Respiratory : Lungs are clear to auscultation bilaterally without wheezes, rhonchi, rales, no increased work of breathing, no cyanosis  6. Cardiovascular : Heart rate is tachycardic, rhythm is regular, no murmurs rubs or gallops, no peripheral edema  7. Gastrointestinal:  Abdomen is soft, nondistended, nontender to palpation, no palpable masses, bowel sounds active  8. Skin:  Skin is warm dry and intact without acute lesion on limited exam  9.Musculoskeletal:  No acute deformity, mild tenderness in bilateral shoulders with palpation, no tenderness to the hips with palpation, no calf  tenderness, peripheral pulses palpated    Data Review:    CBC Recent Labs  Lab 10/23/20 1448  WBC 7.9  HGB 11.4*  HCT 34.4*  PLT 278  MCV 96.1  MCH 31.8  MCHC 33.1  RDW 13.7  LYMPHSABS 1.0  MONOABS 0.3  EOSABS 0.1  BASOSABS 0.0   ------------------------------------------------------------------------------------------------------------------  Results for orders placed or performed during the hospital encounter of 10/23/20 (from the past 48 hour(s))  Comprehensive metabolic panel       Status: Abnormal   Collection Time: 10/23/20  2:48 PM  Result Value Ref Range   Sodium 133 (L) 135 - 145 mmol/L   Potassium 4.2 3.5 - 5.1 mmol/L   Chloride 101 98 - 111 mmol/L   CO2 24 22 - 32 mmol/L   Glucose, Bld 111 (H) 70 - 99 mg/dL    Comment: Glucose reference range applies only to samples taken after fasting for at least 8 hours.   BUN 13 8 - 23 mg/dL   Creatinine, Ser 0.80 0.44 - 1.00 mg/dL   Calcium 9.2 8.9 - 10.3 mg/dL   Total Protein 8.1 6.5 - 8.1 g/dL   Albumin 3.1 (L) 3.5 - 5.0 g/dL   AST 76 (H) 15 - 41 U/L   ALT 37 0 - 44 U/L   Alkaline Phosphatase 83 38 - 126 U/L   Total Bilirubin 0.5 0.3 - 1.2 mg/dL   GFR, Estimated >60 >60 mL/min    Comment: (NOTE) Calculated using the CKD-EPI Creatinine Equation (2021)    Anion gap 8 5 - 15    Comment: Performed at [facility], [address], [place], [place] [address]  CBC with Differential     Status: Abnormal   Collection Time: 10/23/20  2:48 PM  Result Value Ref Range   WBC 7.9 4.0 - 10.5 K/uL   RBC 3.58 (L) 3.87 - 5.11 MIL/uL   Hemoglobin 11.4 (L) 12.0 - 15.0 g/dL   HCT 34.4 (L) 36.0 - 46.0 %   MCV 96.1 80.0 - 100.0 fL   MCH 31.8 26.0 - 34.0 pg   MCHC 33.1 30.0 - 36.0 g/dL   RDW 13.7 11.5 - 15.5 %   Platelets 278 150 - 400 K/uL   nRBC 0.0 0.0 - 0.2 %   Neutrophils Relative % 83 %   Neutro Abs 6.5 1.7 - 7.7 K/uL   Lymphocytes Relative 12 %   Lymphs Abs 1.0 0.7 - 4.0 K/uL   Monocytes Relative 4 %   Monocytes  Absolute 0.3 0.1 - 1.0 K/uL   Eosinophils Relative 1 %   Eosinophils Absolute 0.1 0.0 - 0.5 K/uL   Basophils Relative 0 %   Basophils Absolute 0.0 0.0 - 0.1 K/uL   Immature Granulocytes 0 %   Abs Immature Granulocytes 0.02 0.00 - 0.07 K/uL    Comment: Performed at [facility], [address], [place], [place] [address]  TSH     Status: None   Collection Time: 10/23/20  2:48 PM  Result Value Ref Range   TSH 0.885 0.350 - 4.500 uIU/mL    Comment: Performed by a 3rd Generation assay with a functional sensitivity of <=0.01 uIU/mL. Performed at [facility], [address] [place], [place] [address]   Troponin I (High Sensitivity)     Status: None   Collection Time: 10/23/20  2:48 PM  Result Value Ref Range   Troponin I (High Sensitivity) 5 <18 ng/L    Comment: (NOTE) Elevated high sensitivity troponin I (hsTnI) values and significant  changes across serial measurements may suggest ACS but many other  chronic and acute conditions are known to elevate hsTnI results.  Refer to the "Links" section for chest pain algorithms and additional  guidance. Performed at [facility], [address], [place], [place] [address]   Vitamin B12     Status: None   Collection Time: 10/23/20  2:48 PM  Result Value Ref Range   Vitamin B-12 590 180 - 914 pg/mL    Comment: (  NOTE) This assay is not validated for testing neonatal or myeloproliferative syndrome specimens for Vitamin B12 levels. Performed at [facility], [address] [place], [place] [address]   CK     Status: Abnormal   Collection Time: 10/23/20  2:48 PM  Result Value Ref Range   Total CK 1,206 (H) 38 - 234 U/L    Comment: Performed at [facility], [address] [place], [place] [address]  D-dimer, quantitative     Status: Abnormal   Collection Time: 10/23/20  2:48 PM  Result Value Ref Range   D-Dimer, Quant 2.60 (H) 0.00 - 0.50 ug/mL-FEU    Comment: (NOTE) At the manufacturer cut-off value of 0.5 g/mL FEU, this assay has  a negative predictive value of 95-100%.This assay is intended for use in conjunction with a clinical pretest probability (PTP) assessment model to exclude pulmonary embolism (PE) and deep venous thrombosis (DVT) in outpatients suspected of PE or DVT. Results should be correlated with clinical presentation. Performed at [facility], [address] [place], [place] [address]   Urinalysis, Routine w reflex microscopic Urine, Clean Catch     Status: Abnormal   Collection Time: 10/23/20  4:30 PM  Result Value Ref Range   Color, Urine YELLOW YELLOW   APPearance CLOUDY (A) CLEAR   Specific Gravity, Urine 1.015 1.005 - 1.030   pH 7.0 5.0 - 8.0   Glucose, UA NEGATIVE NEGATIVE mg/dL   Hgb urine dipstick NEGATIVE NEGATIVE   Bilirubin Urine NEGATIVE NEGATIVE   Ketones, ur 5 (A) NEGATIVE mg/dL   Protein, ur 30 (A) NEGATIVE mg/dL   Nitrite NEGATIVE NEGATIVE   Leukocytes,Ua LARGE (A) NEGATIVE   RBC / HPF 6-10 0 - 5 RBC/hpf   WBC, UA >50 (H) 0 - 5 WBC/hpf   Bacteria, UA FEW (A) NONE SEEN   Squamous Epithelial / LPF 6-10 0 - 5   WBC Clumps PRESENT    Mucus PRESENT    Non Squamous Epithelial 0-5 (A) NONE SEEN    Comment: Performed at [facility], [address] [place], [place] [address]  Troponin I (High Sensitivity)     Status: None   Collection Time: 10/23/20  5:34 PM  Result Value Ref Range   Troponin I (High Sensitivity) 6 <18 ng/L    Comment: (NOTE) Elevated high sensitivity troponin I (hsTnI) values and significant  changes across serial measurements may suggest ACS but many other  chronic and acute conditions are known to elevate hsTnI results.  Refer to the "Links" section for chest pain algorithms and additional  guidance. Performed at  Hospital, [address] , [place] [address]     Chemistries  Recent Labs  Lab 10/23/20 1448  NA 133*  K 4.2  CL 101  CO2 24  GLUCOSE 111*  BUN 13  CREATININE 0.80  CALCIUM 9.2  AST 76*  ALT 37  ALKPHOS 83  BILITOT 0.5    ------------------------------------------------------------------------------------------------------------------  ------------------------------------------------------------------------------------------------------------------ GFR: Estimated Creatinine Clearance: 45.6 mL/min (by C-G formula based on SCr of 0.8 mg/dL). Liver Function Tests: Recent Labs  Lab 10/23/20 1448  AST 76*  ALT 37  ALKPHOS 83  BILITOT 0.5  PROT 8.1  ALBUMIN 3.1*   No results for input(s): LIPASE, AMYLASE in the last 168 hours. No results for input(s): AMMONIA in the last 168 hours. Coagulation Profile: No results for input(s): INR, PROTIME in the last 168 hours. Cardiac Enzymes: Recent Labs  Lab 10/23/20 1448  CKTOTAL 1,206*   BNP (last 3   results) No results for input(s): PROBNP in the last 8760 hours. HbA1C: No results for input(s): HGBA1C in the last 72 hours. CBG: No results for input(s): GLUCAP in the last 168 hours. Lipid Profile: No results for input(s): CHOL, HDL, LDLCALC, TRIG, CHOLHDL, LDLDIRECT in the last 72 hours. Thyroid Function Tests: Recent Labs    10/23/20 1448  TSH 0.885   Anemia Panel: Recent Labs    10/23/20 1448  VITAMINB12 590    --------------------------------------------------------------------------------------------------------------- Urine analysis:    Component Value Date/Time   COLORURINE YELLOW 10/23/2020 1630   APPEARANCEUR CLOUDY (A) 10/23/2020 1630   LABSPEC 1.015 10/23/2020 1630   PHURINE 7.0 10/23/2020 1630   GLUCOSEU NEGATIVE 10/23/2020 1630   HGBUR NEGATIVE 10/23/2020 1630   BILIRUBINUR NEGATIVE 10/23/2020 1630   KETONESUR 5 (A) 10/23/2020 1630   PROTEINUR 30 (A) 10/23/2020 1630   NITRITE NEGATIVE 10/23/2020 1630   LEUKOCYTESUR LARGE (A) 10/23/2020 1630      Imaging Results:    DG Chest 2 View  Result Date: 10/23/2020 CLINICAL DATA:  Tachycardia. EXAM: CHEST - 2 VIEW COMPARISON:  None. FINDINGS: The heart size and mediastinal  contours are within normal limits. Both lungs are clear. The visualized skeletal structures are unremarkable. IMPRESSION: No active cardiopulmonary disease. Electronically Signed   By: Marijo Conception M.D.   On: 10/23/2020 16:48   CT Angio Chest PE W and/or Wo Contrast  Result Date: 10/23/2020 CLINICAL DATA:  PE suspected, low/intermediate prob, positive D-dimer Tachycardia, fatigue, positive D-dimer Patient reports weakness and lack of energy for 2 weeks. EXAM: CT ANGIOGRAPHY CHEST WITH CONTRAST TECHNIQUE: Multidetector CT imaging of the chest was performed using the standard protocol during bolus administration of intravenous contrast. Multiplanar CT image reconstructions and MIPs were obtained to evaluate the vascular anatomy. CONTRAST:  15m OMNIPAQUE IOHEXOL 350 MG/ML SOLN COMPARISON:  Radiograph earlier today. FINDINGS: Cardiovascular: There are no filling defects within the pulmonary arteries to suggest pulmonary embolus. Dilated main pulmonary artery at 3.4 cm. Mild aortic atherosclerosis without dissection or acute aortic findings. Mild aortic tortuosity without aneurysm. Normal heart size. Pericardial effusion. Occasional coronary artery calcifications. Mediastinum/Nodes: No enlarged mediastinal or hilar lymph nodes. The upper esophagus is patulous and contains bubbly debris/ingested contents. There is wall thickening of the midesophagus just distal to the esophageal dilatation. No [place] off a gel adenopathy. No thyroid nodule. Lungs/Pleura: Minor subsegmental atelectasis in the lower lobes. No confluent consolidation. No pleural fluid. No pulmonary edema. Trachea and central bronchi are patent. No nodule or pulmonary mass. Upper Abdomen: No acute or unexpected findings. Musculoskeletal: Diffuse osteopenia/osteoporosis. Endplate irregularity to L1-L2 is only partially included in the field of view, presumably degenerative. No focal bone lesion. Review of the MIP images confirms the above findings.  IMPRESSION: 1. No pulmonary embolus. 2. Dilated main pulmonary artery suggesting pulmonary arterial hypertension. 3. Patulous upper esophagus containing bubbly debris/ingested contents. Mild mid esophageal wall thickening just distal to the esophageal dilatation. This may represent esophagitis, however the possibility of stricture or mass is also considered. Recommend further evaluation with endoscopy. Aortic Atherosclerosis (ICD10-I70.0). Electronically Signed   By: MKeith RakeM.D.   On: 10/23/2020 18:35    My personal review of EKG: Rhythm sinus tachycardia, Rate 108/min, QTc455 ,no Acute ST changes   Assessment & Plan:    Active Problems:   UTI (urinary tract infection)   Elevated d-dimer   Tachycardia   Abnormal computed tomography of esophagus   Mild protein-calorie malnutrition (HCC)   Rhabdomyolysis   HTN (  hypertension)   1. UTI 1. UA indicative of UTI, patient reporting frequency and urgency 2. Urine culture pending 3. Blood culture pending 4. Rocephin started-continue 2. Generalized weakness 1. Possibly secondary to infection 2. Likely also related to #6 3. PT consult and treat 3. Elevated D-dimer and tachycardia 1. CTA shows no pulmonary embolus 4. Abnormal CT esophagus 1. Possible esophagitis 2. Family reported history of stricture 3. Consult GI 4. N.p.o. after midnight 5. Protonix twice daily 6. Continue to monitor 5. Mild protein calorie malnutrition 1. Albumin 3.1 2. Encourage nutrient dense food choices 3. Patient has had poor p.o. intake secondary to decreased appetite 4. May consider adding protein shake 6. Rhabdomyolysis 1. CPK 1206 2. Unclear etiology 3. Troponin 5 and 6 4. Renal function normal 5. Continue aggressive hydration 6. Patient does complain of symmetric shoulder pain, hip pain putting polymyositis on the differential.  ESR and CRP pending for further work-up-holding on prednisone until more data is available 7. TSH pending 8. Trend  in the a.m. 7. Hypertension 1. Continue losartan   DVT Prophylaxis-   Heparin - SCDs   AM Labs Ordered, also please review Full Orders  Family Communication: No family at bedside  Code Status:  Limited - DNI  Admission status: Observation Time spent in minutes :[place]  DO

## 2020-10-23 NOTE — ED Triage Notes (Signed)
C/o weakness and lack of energy for 2 weeks

## 2020-10-23 NOTE — ED Notes (Signed)
Pt ambulatory to and from the bathroom, family at bedside.

## 2020-10-23 NOTE — Progress Notes (Signed)
Pt to unit at this time via stretcher, pt ambulated with CGA to bathroom, voided at this time. Pt is Ax)x4. Pt states she has felt weak over the last couple of days and came to ED for evaluation. Pt VSS, BP elevated, MD Zierle-Ghosh made aware, metoprolol ordered. Pt has a pair of shoes, pants, and underwear at bedside. Admission completed by this Clinical research associate with report from [place], [place]. Tele monitor connected to patient at this time. Pt educated how to call for help , purewick placed on patient. Callbell within reach, bed alarm on.

## 2020-10-23 NOTE — ED Provider Notes (Signed)
[facility] EMERGENCY DEPARTMENT Provider Note   CSN: [id number] Arrival date & time: 10/23/20  1354     History Chief Complaint  Patient presents with  . Fatigue    Kathryn [name] is a 80 y.o. female with a past medical history of B12 deficiency, GERD, hypertension, DM 2, who presents today for evaluation of approximately 2 weeks of fatigue.  She states that she has had fatigue worse for 2 weeks.  She, according to PCP notes, has a B12 deficiency and was started on injections in November however still was noted to feel tired. Patient reports poor appetite and overall poor p.o. intake.  She states that when she gets up to walk around she feels weak.  No pain unless she is moving.  She denies any CP,chough, shob.  She denies urinary symptoms, no abdominal pain, no N/V.   On chart review of available records it appears that at least since 2016 she had been complaining of fatigue and it appears in 2020 she was diagnosed with chronic fatigue on review of medical records.  Additional history is provided by patient's sister-in-law, an ER RN, with patient's consent. She reports that at baseline patient is very energetic, cooks a lot and she has been unable to do these things over the past 2 to 3 weeks and that she feels like the symptoms are worsening.  She provides the example that patient had a cake recipe that she wanted to cook however patient told her that she was too weak to get out of bed to do this.  Patient does not take any statins or cholesterol medications. HPI     Past Medical History:  Diagnosis Date  . Acid reflux   . Diabetes [facility])     Patient Active Problem List   Diagnosis Date Noted  . H/O rotator cuff syndrome 07/12/2012  . Rotator cuff tendinitis 07/12/2012  . SHOULDER PAIN 10/30/2007    Past Surgical History:  Procedure Laterality Date  . CHOLECYSTECTOMY    . Histosalpingogram       OB History   No obstetric history on file.     Family History   Problem Relation Age of Onset  . Cancer Other   . Diabetes Other   . Arthritis Other     Social History   Tobacco Use  . Smoking status: Never Smoker  . Smokeless tobacco: Never Used  Vaping Use  . Vaping Use: Never used  Substance Use Topics  . Alcohol use: No  . Drug use: No    Home Medications Prior to Admission medications   Medication Sig Start Date End Date Taking? Authorizing Provider  aspirin 81 MG tablet Take 81 mg by mouth daily.   Yes [provider]  losartan (COZAAR) 50 MG tablet Take 50 mg by mouth daily. 09/26/20  Yes [provider]  pantoprazole (PROTONIX) 40 MG tablet Take 40 mg by mouth daily.   Yes [provider]  triamcinolone ointment (KENALOG) 0.1 % Apply topically. 09/26/20  Yes [provider]    Allergies    Cyclobenzaprine hcl  Review of Systems   Review of Systems  Constitutional: Positive for activity change, appetite change and fatigue. Negative for fever and unexpected weight change.  HENT: Negative for congestion.   Respiratory: Negative for chest tightness and shortness of breath.   Cardiovascular: Negative for chest pain.  Gastrointestinal: Negative for abdominal pain, diarrhea, nausea and vomiting.  Genitourinary: Negative for dysuria, frequency and urgency.  Musculoskeletal: Positive  for myalgias. Negative for back pain and neck pain.  Skin: Negative for color change, rash and wound.  Neurological: Positive for weakness (Diffuse, global). Negative for light-headedness and headaches.  Psychiatric/Behavioral: Negative for confusion.    Physical Exam Updated Vital Signs BP (!) 169/78 (BP Location: Right Arm)   Pulse (!) 108   Temp 98.7 F (37.1 C) (Oral)   Resp (!) 21   Ht 5\' 3"  (1.6 m)   Wt 59.9 kg   SpO2 100%   BMI 23.38 kg/m   Physical Exam Vitals and nursing note reviewed.  Constitutional:      Comments: Patient is awake and alert.    HENT:     Head: Normocephalic and atraumatic.   Eyes:     General: No scleral icterus.       Right eye: No discharge.        Left eye: No discharge.     Conjunctiva/sclera: Conjunctivae normal.  Cardiovascular:     Rate and Rhythm: Regular rhythm. Tachycardia present.     Pulses: Normal pulses.     Heart sounds: Normal heart sounds.  Pulmonary:     Effort: Pulmonary effort is normal. No respiratory distress.     Breath sounds: No stridor.  Abdominal:     General: Bowel sounds are normal. There is no distension.     Palpations: Abdomen is soft.     Tenderness: There is no abdominal tenderness. There is no guarding.  Musculoskeletal:        General: No deformity.     Cervical back: Normal range of motion and neck supple.     Right lower leg: No edema.     Left lower leg: No edema.  Skin:    General: Skin is warm and dry.  Neurological:     Mental Status: She is alert.     Motor: No abnormal muscle tone.     Comments: Patient is awake and alert, answers questions appropriately.  Speech is not slurred.  Able to hold all 4 extremities off the bed against gravity.  Facial movements are grossly symmetric.  Psychiatric:        Behavior: Behavior normal.     ED Results / Procedures / Treatments   Labs (all labs ordered are listed, but only abnormal results are displayed) Labs Reviewed  COMPREHENSIVE METABOLIC PANEL - Abnormal; Notable for the following components:      Result Value   Sodium 133 (*)    Glucose, Bld 111 (*)    Albumin 3.1 (*)    AST 76 (*)    All other components within normal limits  CBC WITH DIFFERENTIAL/PLATELET - Abnormal; Notable for the following components:   RBC 3.58 (*)    Hemoglobin 11.4 (*)    HCT 34.4 (*)    All other components within normal limits  URINALYSIS, ROUTINE W REFLEX MICROSCOPIC - Abnormal; Notable for the following components:   APPearance CLOUDY (*)    Ketones, ur 5 (*)    Protein, ur 30 (*)    Leukocytes,Ua LARGE (*)    WBC, UA >50 (*)    Bacteria, UA FEW (*)    Non Squamous  Epithelial 0-5 (*)    All other components within normal limits  CK - Abnormal; Notable for the following components:   Total CK 1,206 (*)    All other components within normal limits  D-DIMER, QUANTITATIVE - Abnormal; Notable for the following components:   D-Dimer, Quant 2.60 (*)    All  other components within normal limits  URINE CULTURE  RESP PANEL BY RT-PCR (FLU A&B, COVID) ARPGX2  CULTURE, BLOOD (ROUTINE X 2)  CULTURE, BLOOD (ROUTINE X 2)  TSH  VITAMIN B12  LACTIC ACID, PLASMA  LACTIC ACID, PLASMA  TROPONIN I (HIGH SENSITIVITY)  TROPONIN I (HIGH SENSITIVITY)    EKG None  Radiology DG Chest 2 View  Result Date: 10/23/2020 CLINICAL DATA:  Tachycardia. EXAM: CHEST - 2 VIEW COMPARISON:  None. FINDINGS: The heart size and mediastinal contours are within normal limits. Both lungs are clear. The visualized skeletal structures are unremarkable. IMPRESSION: No active cardiopulmonary disease. Electronically Signed   By: Lupita RaiderJames  Green Jr M.D.   On: 10/23/2020 16:48   CT Angio Chest PE W and/or Wo Contrast  Result Date: 10/23/2020 CLINICAL DATA:  PE suspected, low/intermediate prob, positive D-dimer Tachycardia, fatigue, positive D-dimer Patient reports weakness and lack of energy for 2 weeks. EXAM: CT ANGIOGRAPHY CHEST WITH CONTRAST TECHNIQUE: Multidetector CT imaging of the chest was performed using the standard protocol during bolus administration of intravenous contrast. Multiplanar CT image reconstructions and MIPs were obtained to evaluate the vascular anatomy. CONTRAST:  75mL OMNIPAQUE IOHEXOL 350 MG/ML SOLN COMPARISON:  Radiograph earlier today. FINDINGS: Cardiovascular: There are no filling defects within the pulmonary arteries to suggest pulmonary embolus. Dilated main pulmonary artery at 3.4 cm. Mild aortic atherosclerosis without dissection or acute aortic findings. Mild aortic tortuosity without aneurysm. Normal heart size. Pericardial effusion. Occasional coronary artery  calcifications. Mediastinum/Nodes: No enlarged mediastinal or hilar lymph nodes. The upper esophagus is patulous and contains bubbly debris/ingested contents. There is wall thickening of the midesophagus just distal to the esophageal dilatation. No Paris off a gel adenopathy. No thyroid nodule. Lungs/Pleura: Minor subsegmental atelectasis in the lower lobes. No confluent consolidation. No pleural fluid. No pulmonary edema. Trachea and central bronchi are patent. No nodule or pulmonary mass. Upper Abdomen: No acute or unexpected findings. Musculoskeletal: Diffuse osteopenia/osteoporosis. Endplate irregularity to L1-L2 is only partially included in the field of view, presumably degenerative. No focal bone lesion. Review of the MIP images confirms the above findings. IMPRESSION: 1. No pulmonary embolus. 2. Dilated main pulmonary artery suggesting pulmonary arterial hypertension. 3. Patulous upper esophagus containing bubbly debris/ingested contents. Mild mid esophageal wall thickening just distal to the esophageal dilatation. This may represent esophagitis, however the possibility of stricture or mass is also considered. Recommend further evaluation with endoscopy. Aortic Atherosclerosis (ICD10-I70.0). Electronically Signed   By: Narda RutherfordMelanie  Sanford M.D.   On: 10/23/2020 18:35    Procedures Procedures   Medications Ordered in ED Medications  lactated ringers infusion (has no administration in time range)  cefTRIAXone (ROCEPHIN) 1 g in sodium chloride 0.9 % 100 mL IVPB (has no administration in time range)  sodium chloride 0.9 % bolus 500 mL (0 mLs Intravenous Stopped 10/23/20 1842)  iohexol (OMNIPAQUE) 350 MG/ML injection 75 mL (75 mLs Intravenous Contrast Given 10/23/20 1801)    ED Course  I have reviewed the triage vital signs and the nursing notes.  Pertinent labs & imaging results that were available during my care of the patient were reviewed by me and considered in my medical decision making (see  chart for details).    MDM Rules/Calculators/A&P                         Patient is a 80 year old woman who presents today for evaluation of 2 to 3 weeks of worsening fatigue, global weakness and poor appetite. On  exam she is noted to be tachycardic.  Her heart rate will jump up into the 120s and she is also tachypneic.  This does appear to meet SIRS criteria however there is no clear source of infection. Labs are obtained and reviewed, white count is not elevated, she is afebrile.  Mild anemia with a hemoglobin of 11.4.  CMP shows AST is slightly elevated at 76 however otherwise CMP is mostly unremarkable.  Her CK is elevated at 1200, TSH and B12 are both normal.  Troponin x2 is negative. Given her persistent tachycardia D-dimer was ordered which was elevated at 2.60.  CTA PE study was obtained without evidence of PE, does show abnormalities in her esophagus. Patient's urine then returned concerning for a possible UTI with over 50 white cells, few bacteria.  Given patient's overall worsening we will treat with IV antibiotics.  At this point with a infectious source code sepsis orders are placed including IV antibiotics, IV fluids and plan for admit. While in my care patient did not require 30/kg fluid bolus as she was not hypotensive and lactic acid was not over 4.  Blood and urine cultures are ordered  Patient will require admission.   Note: Portions of this report may have been transcribed using voice recognition software. Every effort was made to ensure accuracy; however, inadvertent computerized transcription errors may be present  Final Clinical Impression(s) / ED Diagnoses Final diagnoses:  Abnormal CT scan, esophagus  Fatigue, unspecified type  Weakness  Lower urinary tract infectious disease  Tachycardia    Rx / DC Orders ED Discharge Orders    None       Norman Clay 10/23/20 Vickki Muff, MD 10/25/20 1435

## 2020-10-24 ENCOUNTER — Encounter (HOSPITAL_COMMUNITY): Payer: Self-pay | Admitting: Family Medicine

## 2020-10-24 ENCOUNTER — Encounter (HOSPITAL_COMMUNITY)
Admission: EM | Disposition: A | Discharge status: Home / Self Care | Payer: Self-pay | Source: Home / Self Care | Attending: Internal Medicine

## 2020-10-24 DIAGNOSIS — K219 Gastro-esophageal reflux disease without esophagitis: Secondary | ICD-10-CM | POA: Diagnosis present

## 2020-10-24 DIAGNOSIS — N390 Urinary tract infection, site not specified: Principal | ICD-10-CM

## 2020-10-24 DIAGNOSIS — Z7982 Long term (current) use of aspirin: Secondary | ICD-10-CM | POA: Diagnosis not present

## 2020-10-24 DIAGNOSIS — R7989 Other specified abnormal findings of blood chemistry: Secondary | ICD-10-CM

## 2020-10-24 DIAGNOSIS — Z79899 Other long term (current) drug therapy: Secondary | ICD-10-CM | POA: Diagnosis not present

## 2020-10-24 DIAGNOSIS — Z6823 Body mass index (BMI) 23.0-23.9, adult: Secondary | ICD-10-CM | POA: Diagnosis not present

## 2020-10-24 DIAGNOSIS — I2510 Atherosclerotic heart disease of native coronary artery without angina pectoris: Secondary | ICD-10-CM | POA: Diagnosis present

## 2020-10-24 DIAGNOSIS — E538 Deficiency of other specified B group vitamins: Secondary | ICD-10-CM | POA: Diagnosis present

## 2020-10-24 DIAGNOSIS — R1310 Dysphagia, unspecified: Secondary | ICD-10-CM

## 2020-10-24 DIAGNOSIS — E119 Type 2 diabetes mellitus without complications: Secondary | ICD-10-CM | POA: Diagnosis present

## 2020-10-24 DIAGNOSIS — M6282 Rhabdomyolysis: Secondary | ICD-10-CM | POA: Diagnosis present

## 2020-10-24 DIAGNOSIS — Z888 Allergy status to other drugs, medicaments and biological substances status: Secondary | ICD-10-CM | POA: Diagnosis not present

## 2020-10-24 DIAGNOSIS — R5383 Other fatigue: Secondary | ICD-10-CM

## 2020-10-24 DIAGNOSIS — Z20822 Contact with and (suspected) exposure to covid-19: Secondary | ICD-10-CM | POA: Diagnosis present

## 2020-10-24 DIAGNOSIS — K449 Diaphragmatic hernia without obstruction or gangrene: Secondary | ICD-10-CM | POA: Diagnosis present

## 2020-10-24 DIAGNOSIS — R634 Abnormal weight loss: Secondary | ICD-10-CM

## 2020-10-24 DIAGNOSIS — I10 Essential (primary) hypertension: Secondary | ICD-10-CM | POA: Diagnosis present

## 2020-10-24 DIAGNOSIS — R933 Abnormal findings on diagnostic imaging of other parts of digestive tract: Secondary | ICD-10-CM | POA: Diagnosis not present

## 2020-10-24 DIAGNOSIS — D638 Anemia in other chronic diseases classified elsewhere: Secondary | ICD-10-CM | POA: Diagnosis present

## 2020-10-24 DIAGNOSIS — K220 Achalasia of cardia: Secondary | ICD-10-CM | POA: Diagnosis present

## 2020-10-24 DIAGNOSIS — R6881 Early satiety: Secondary | ICD-10-CM

## 2020-10-24 DIAGNOSIS — E441 Mild protein-calorie malnutrition: Secondary | ICD-10-CM | POA: Diagnosis present

## 2020-10-24 DIAGNOSIS — Z800 Family history of malignant neoplasm of digestive organs: Secondary | ICD-10-CM | POA: Diagnosis not present

## 2020-10-24 HISTORY — PX: ESOPHAGEAL DILATION: SHX303

## 2020-10-24 HISTORY — PX: ESOPHAGOGASTRODUODENOSCOPY: SHX5428

## 2020-10-24 LAB — CBC WITH DIFFERENTIAL/PLATELET
Abs Immature Granulocytes: 0.02 10*3/uL (ref 0.00–0.07)
Basophils Absolute: 0 10*3/uL (ref 0.0–0.1)
Basophils Relative: 0 %
Eosinophils Absolute: 0.1 10*3/uL (ref 0.0–0.5)
Eosinophils Relative: 2 %
HCT: 32.2 % — ABNORMAL LOW (ref 36.0–46.0)
Hemoglobin: 10.5 g/dL — ABNORMAL LOW (ref 12.0–15.0)
Immature Granulocytes: 0 %
Lymphocytes Relative: 14 %
Lymphs Abs: 0.9 10*3/uL (ref 0.7–4.0)
MCH: 31.2 pg (ref 26.0–34.0)
MCHC: 32.6 g/dL (ref 30.0–36.0)
MCV: 95.5 fL (ref 80.0–100.0)
Monocytes Absolute: 0.3 10*3/uL (ref 0.1–1.0)
Monocytes Relative: 5 %
Neutro Abs: 5.3 10*3/uL (ref 1.7–7.7)
Neutrophils Relative %: 79 %
Platelets: 254 10*3/uL (ref 150–400)
RBC: 3.37 MIL/uL — ABNORMAL LOW (ref 3.87–5.11)
RDW: 13.4 % (ref 11.5–15.5)
WBC: 6.7 10*3/uL (ref 4.0–10.5)
nRBC: 0 % (ref 0.0–0.2)

## 2020-10-24 LAB — COMPREHENSIVE METABOLIC PANEL
ALT: 29 U/L (ref 0–44)
AST: 60 U/L — ABNORMAL HIGH (ref 15–41)
Albumin: 2.5 g/dL — ABNORMAL LOW (ref 3.5–5.0)
Alkaline Phosphatase: 69 U/L (ref 38–126)
Anion gap: 7 (ref 5–15)
BUN: 9 mg/dL (ref 8–23)
CO2: 23 mmol/L (ref 22–32)
Calcium: 8.7 mg/dL — ABNORMAL LOW (ref 8.9–10.3)
Chloride: 103 mmol/L (ref 98–111)
Creatinine, Ser: 0.6 mg/dL (ref 0.44–1.00)
GFR, Estimated: >60 mL/min (ref >60)
Glucose, Bld: 76 mg/dL (ref 70–99)
Potassium: 3.9 mmol/L (ref 3.5–5.1)
Sodium: 133 mmol/L — ABNORMAL LOW (ref 135–145)
Total Bilirubin: 0.8 mg/dL (ref 0.3–1.2)
Total Protein: 6.7 g/dL (ref 6.5–8.1)

## 2020-10-24 LAB — IRON AND TIBC
Iron: 44 ug/dL (ref 28–170)
Saturation Ratios: 30 % (ref 10.4–31.8)
TIBC: 149 ug/dL — ABNORMAL LOW (ref 250–450)
UIBC: 105 ug/dL

## 2020-10-24 LAB — SURGICAL PCR SCREEN
MRSA, PCR: NEGATIVE
Staphylococcus aureus: NEGATIVE

## 2020-10-24 LAB — FERRITIN: Ferritin: 752 ng/mL — ABNORMAL HIGH (ref 11–307)

## 2020-10-24 LAB — TSH: TSH: 0.962 u[IU]/mL (ref 0.350–4.500)

## 2020-10-24 LAB — CK: Total CK: 1042 U/L — ABNORMAL HIGH (ref 38–234)

## 2020-10-24 LAB — GLUCOSE, CAPILLARY
Glucose-Capillary: 61 mg/dL — ABNORMAL LOW (ref 70–99)
Glucose-Capillary: 81 mg/dL (ref 70–99)

## 2020-10-24 LAB — MAGNESIUM: Magnesium: 1.8 mg/dL (ref 1.7–2.4)

## 2020-10-24 SURGERY — EGD (ESOPHAGOGASTRODUODENOSCOPY)
Anesthesia: Moderate Sedation

## 2020-10-24 MED ORDER — PANTOPRAZOLE SODIUM 40 MG PO TBEC
40.0000 mg | DELAYED_RELEASE_TABLET | Freq: Every day | ORAL | Status: DC
  Administered 2020-10-24 – 2020-10-25 (×2): 40 mg via ORAL
  Filled 2020-10-24 (×2): qty 1

## 2020-10-24 MED ORDER — ONDANSETRON HCL 4 MG/2ML IJ SOLN
INTRAMUSCULAR | Status: AC
  Filled 2020-10-24: qty 2

## 2020-10-24 MED ORDER — STERILE WATER FOR IRRIGATION IR SOLN
Status: DC | PRN
  Administered 2020-10-24: 1.5 mL

## 2020-10-24 MED ORDER — MIDAZOLAM HCL 5 MG/5ML IJ SOLN
INTRAMUSCULAR | Status: AC
  Filled 2020-10-24: qty 10

## 2020-10-24 MED ORDER — PREDNISONE 5 MG PO TABS
15.0000 mg | ORAL_TABLET | Freq: Every day | ORAL | Status: DC
  Administered 2020-10-24 – 2020-10-25 (×2): 15 mg via ORAL
  Filled 2020-10-24 (×2): qty 1

## 2020-10-24 MED ORDER — DEXTROSE 50 % IV SOLN
INTRAVENOUS | Status: AC
  Filled 2020-10-24: qty 50

## 2020-10-24 MED ORDER — METOPROLOL TARTRATE 25 MG PO TABS
25.0000 mg | ORAL_TABLET | Freq: Two times a day (BID) | ORAL | Status: DC
  Administered 2020-10-24 – 2020-10-25 (×3): 25 mg via ORAL
  Filled 2020-10-24 (×3): qty 1

## 2020-10-24 MED ORDER — MEPERIDINE HCL 100 MG/ML IJ SOLN
INTRAMUSCULAR | Status: DC | PRN
  Administered 2020-10-24: 25 mg via INTRAVENOUS
  Administered 2020-10-24: 15 mg via INTRAVENOUS
  Administered 2020-10-24: 10 mg via INTRAVENOUS

## 2020-10-24 MED ORDER — LIDOCAINE VISCOUS HCL 2 % MT SOLN
OROMUCOSAL | Status: DC | PRN
  Administered 2020-10-24: 5 mL via OROMUCOSAL

## 2020-10-24 MED ORDER — DEXTROSE 50 % IV SOLN
12.5000 g | Freq: Once | INTRAVENOUS | Status: AC
  Administered 2020-10-24: 12.5 g via INTRAVENOUS

## 2020-10-24 MED ORDER — MIDAZOLAM HCL 5 MG/5ML IJ SOLN
INTRAMUSCULAR | Status: DC | PRN
  Administered 2020-10-24 (×4): 1 mg via INTRAVENOUS

## 2020-10-24 MED ORDER — LIDOCAINE VISCOUS HCL 2 % MT SOLN
OROMUCOSAL | Status: AC
  Filled 2020-10-24: qty 15

## 2020-10-24 MED ORDER — MEPERIDINE HCL 50 MG/ML IJ SOLN
INTRAMUSCULAR | Status: AC
  Filled 2020-10-24: qty 1

## 2020-10-24 MED ORDER — SODIUM CHLORIDE 0.9 % IV SOLN
INTRAVENOUS | Status: DC
  Administered 2020-10-24: 1000 mL via INTRAVENOUS

## 2020-10-24 NOTE — Op Note (Signed)
[facility] Patient Name: Kathryn [name] Procedure Date: 10/24/2020 1:27 PM MRN: [id number] Date of Birth: [birth year] Attending MD: Gennette Pac , MD CSN: [id number] Age: 80 Admit Type: Inpatient Procedure:                Upper GI endoscopy Indications:              Dysphagia, Abnormal CT of the GI tract Providers:                Gennette Pac, MD, Angelica Ran, Burke Keels, Technician Referring MD:              Medicines:                Midazolam 4 mg IV, Meperidine 50 mg IV Complications:            No immediate complications. Estimated Blood Loss:     Estimated blood loss was minimal. Procedure:                Pre-Anesthesia Assessment:                           - Prior to the procedure, a History and Physical                            was performed, and patient medications and                            allergies were reviewed. The patient's tolerance of                            previous anesthesia was also reviewed. The risks                            and benefits of the procedure and the sedation                            options and risks were discussed with the patient.                            All questions were answered, and informed consent                            was obtained. Prior Anticoagulants: The patient has                            taken no previous anticoagulant or antiplatelet                            agents. ASA Grade Assessment: III - A patient with                            severe systemic disease. After reviewing the risks  and benefits, the patient was deemed in                            satisfactory condition to undergo the procedure.                           After obtaining informed consent, the endoscope was                            passed under direct vision. Throughout the                            procedure, the patient's blood pressure, pulse, and                             oxygen saturations were monitored continuously. The                            [id number]) was introduced through the mouth,                            and advanced to the second part of duodenum. The                            upper GI endoscopy was accomplished without                            difficulty. The patient tolerated the procedure                            well. Scope In: 2:02:29 PM Scope Out: 2:12:46 PM Total Procedure Duration: 0 hours 10 minutes 17 seconds  Findings:      The examined esophagus was normal.      A small hiatal hernia was present. Otherwise otherwise, normal-appearing       gastric mucosa. Patent pylorus. Patent pylorus.      The duodenal bulb and second portion of the duodenum were normal. The       scope was withdrawn. Dilation was performed with a Maloney dilator with       mild resistance at 56 Fr. The dilation site was examined following       endoscope reinsertion and showed no change. Estimated blood loss was       minimal. Impression:               - Normal esophagus. Dilated.                           - Small hiatal hernia.                           - The examination was otherwise normal.                           - Normal duodenal bulb and second portion of the  duodenum.                           - No specimens collected.                           -History of achalasia diagnosed by manometry                            previously. Certainly, today the esophagus did not                            appear typical of someone with advanced achalasia. Moderate Sedation:      Moderate (conscious) sedation was administered by the endoscopy nurse       and supervised by the endoscopist. The following parameters were       monitored: oxygen saturation, heart rate, blood pressure, respiratory       rate, EKG, adequacy of pulmonary ventilation, and response to care.       Total physician intraservice time was  25 minutes. Recommendation:           - Return patient to hospital ward for observation.                           -Dysphagia 2 diet. Transition PPI to Protonix 40 mg                            orally once daily. Patient urged to use her upper                            and lower dentures when eating. Patient urged to                            follow-up with her primary gastroenterologist. I                            have discussed my findings and recommendations with                            Kathryn [name], daughter, at [phone] Procedure Code(s):        --- Professional ---                           [phone], Esophagogastroduodenoscopy, flexible,                            transoral; diagnostic, including collection of                            specimen(s) by brushing or washing, when performed                            (separate procedure)                           43450, Dilation of esophagus, by unguided sound or  bougie, single or multiple passes                           99153, Moderate sedation; each additional 15                            minutes intraservice time                           G0500, Moderate sedation services provided by the                            same physician or other qualified health care                            professional performing a gastrointestinal                            endoscopic service that sedation supports,                            requiring the presence of an independent trained                            observer to assist in the monitoring of the                            patient's level of consciousness and physiological                            status; initial 15 minutes of intra-service time;                            patient age 62 years or older (additional time may                            be reported with 11572, as appropriate) Diagnosis Code(s):        --- Professional ---                            K44.9, Diaphragmatic hernia without obstruction or                            gangrene                           R13.10, Dysphagia, unspecified                           R93.3, Abnormal findings on diagnostic imaging of                            other parts of digestive tract CPT copyright 2019 [employer]. All rights reserved. The codes documented in this report are preliminary and upon coder review may  be revised to meet current compliance requirements. Molly Maduro  Sonnie Alamo, MD Gennette Pac, MD 10/24/2020 2:32:30 PM This report has been signed electronically. Number of Addenda: 0

## 2020-10-24 NOTE — Evaluation (Signed)
Physical Therapy Evaluation Patient Details Name: Kathryn [name] MRN: [id number] DOB: 1940-08-30 Today's Date: 10/24/2020   History of Present Illness  Kathryn [name]  is a 80 y.o. female, with history of acid reflux, hypertension, and more presents the ED with a chief complaint of generalized weakness.  Patient reports that weakness and fatigue started about 2 weeks ago.  It was gradual in onset and constant until 4 days ago and started getting progressively worse.  Initially she reports no pain, but then reports that she has symmetric shoulder and hip pain that is worse with position changes.  She reports she has never had arthritic pain but she does have a torn rotator cuff on the right.  She thought maybe she was also developing a rotator cuff injury on the left.  Patient denies any fevers, chills, sweats.  She denies any dysuria or hematuria, but admits to urinary frequency and urgency.  Patient has had no falls at home and denies being down for any significant amount of time.  Patient reports that she is having no trouble swallowing, but does not have any appetite.  She has not had an appetite for 2 to 3 months per her report, and she believes that it is getting worse.  All she had today was a piece of bacon and a glass of milk.  Family had told the ER provider that she had a history of esophageal stricture, but patient reports that she does not remember if she did or not.  As per patient's weakness, she reports she is still been able to attend to her ADLs, but has been overly concerned that she might have a fall when she is trying to ambulate through the home secondary to the weakness.  She had no near syncope events.    Clinical Impression  Patient limited for functional mobility as stated below secondary to BLE weakness, fatigue and impaired balance. Patient requires minimal assist to pull to seated EOB. She demonstrates good sitting tolerance and sitting balance and is able to transfer to standing  without AD. She is slightly unsteady upon initial standing and ambulates with unsteadiness but no loss of balance without AD. She is limited by fatigue. Patient ends session seated in chair at bedside. Discussed following up with  PT for improving LE strength, balance, and endurance and patient was eager to begin OPPT to improve deficits. Patient will benefit from continued physical therapy in hospital and recommended venue below to increase strength, balance, endurance for safe ADLs and gait.     Follow Up Recommendations Outpatient PT    Equipment Recommendations  None recommended by PT    Recommendations for Other Services       Precautions / Restrictions Precautions Precautions: Fall Restrictions Weight Bearing Restrictions: No      Mobility  Bed Mobility Overal bed mobility: Needs Assistance Bed Mobility: Supine to Sit     Supine to sit: Min guard;Min assist     General bed mobility comments: assist to pull to seated EOB    Transfers Overall transfer level: Needs assistance Equipment used: None Transfers: Sit to/from Stand Sit to Stand: Min guard         General transfer comment: transfer to standing without AD, slightly unsteady upon initial standing  Ambulation/Gait Ambulation/Gait assistance: Min guard;Supervision Gait Distance (Feet): 120 Feet Assistive device: None Gait Pattern/deviations: Step-through pattern;Decreased step length - right;Decreased step length - left;Decreased stride length Gait velocity: decreased   General Gait Details: slightly unsteady cadence without AD,  no loss of balance  Stairs            Wheelchair Mobility    Modified Rankin (Stroke Patients Only)       Balance Overall balance assessment: Needs assistance Sitting-balance support: Feet supported Sitting balance-Leahy Scale: Normal Sitting balance - Comments: seated EOB   Standing balance support: No upper extremity supported Standing balance-Leahy Scale:  Fair Standing balance comment: fair/good without AD                             Pertinent Vitals/Pain Pain Assessment: No/denies pain    Home Living Family/patient expects to be discharged to:: Private residence Living Arrangements: Children Available Help at Discharge: Family;Available 24 hours/day Type of Home: House Home Access: Level entry     Home Layout: One level Home Equipment: Emergency planning/management officer - 4 wheels      Prior Function Level of Independence: Independent         Comments: Patietn states household ambulation without AD, independent with basic ADL     Hand Dominance        Extremity/Trunk Assessment   Upper Extremity Assessment Upper Extremity Assessment: Overall WFL for tasks assessed    Lower Extremity Assessment Lower Extremity Assessment: Generalized weakness    Cervical / Trunk Assessment Cervical / Trunk Assessment: Normal  Communication   Communication: No difficulties  Cognition Arousal/Alertness: Awake/alert Behavior During Therapy: WFL for tasks assessed/performed Overall Cognitive Status: Within Functional Limits for tasks assessed                                        General Comments      Exercises     Assessment/Plan    PT Assessment Patient needs continued PT services  PT Problem List Decreased strength;Decreased mobility;Decreased activity tolerance;Decreased balance;Decreased knowledge of use of DME       PT Treatment Interventions DME instruction;Therapeutic exercise;Gait training;Balance training;Stair training;Neuromuscular re-education;Functional mobility training;Therapeutic activities;Patient/family education    PT Goals (Current goals can be found in the Care Plan section)  Acute Rehab PT Goals Patient Stated Goal: Get stronger PT Goal Formulation: With patient Time For Goal Achievement: 11/07/20 Potential to Achieve Goals: Good    Frequency Min 3X/week   Barriers to  discharge        Co-evaluation               AM-PAC PT "6 Clicks" Mobility  Outcome Measure Help needed turning from your back to your side while in a flat bed without using bedrails?: None Help needed moving from lying on your back to sitting on the side of a flat bed without using bedrails?: A Little Help needed moving to and from a bed to a chair (including a wheelchair)?: A Little Help needed standing up from a chair using your arms (e.g., wheelchair or bedside chair)?: A Little Help needed to walk in hospital room?: A Little Help needed climbing 3-5 steps with a railing? : A Lot 6 Click Score: 18    End of Session Equipment Utilized During Treatment: Gait belt Activity Tolerance: Patient tolerated treatment well;Patient limited by fatigue Patient left: in chair;with call bell/phone within reach Nurse Communication: Mobility status PT Visit Diagnosis: Unsteadiness on feet (R26.81);Other abnormalities of gait and mobility (R26.89);Muscle weakness (generalized) (M62.81)    Time: 6387-5643 PT Time Calculation (min) (ACUTE ONLY): 26 min  Charges:   PT Evaluation $PT Eval Moderate Complexity: 1 Mod PT Treatments $Therapeutic Activity: 8-22 mins        8:44 AM, 10/24/20 Wyman Songster PT, DPT Physical Therapist at [facility]

## 2020-10-24 NOTE — TOC Progression Note (Signed)
Transition of Care [facility]) - Progression Note    Patient Details  Name: Kathryn [name] MRN: [id number] Date of Birth: [birth year]  Transition of Care [facility]) CM/SW Contact  Karn Cassis, [place] Phone Number: 10/24/2020, 10:42 AM  Clinical Narrative:  PT recommending outpatient PT. LCSW discussed with pt who requests [facility]. Referral faxed to [facility]. No other needs reported at this time.           Expected Discharge Plan and Services                                                 Social Determinants of Health (SDOH) Interventions    Readmission Risk Interventions No flowsheet data found.

## 2020-10-24 NOTE — Plan of Care (Signed)
  Problem: Acute Rehab PT Goals(only PT should resolve) Goal: Patient Will Transfer Sit To/From Stand Outcome: Progressing Flowsheets (Taken 10/24/2020 0845) Patient will transfer sit to/from stand: with modified independence Goal: Pt Will Transfer Bed To Chair/Chair To Bed Outcome: Progressing Flowsheets (Taken 10/24/2020 0845) Pt will Transfer Bed to Chair/Chair to Bed: with modified independence Goal: Pt Will Ambulate Outcome: Progressing Flowsheets (Taken 10/24/2020 0845) Pt will Ambulate:  > 125 feet  with modified independence Goal: Pt/caregiver will Perform Home Exercise Program Outcome: Progressing Flowsheets (Taken 10/24/2020 0845) Pt/caregiver will Perform Home Exercise Program:  For increased strengthening  For improved balance  Independently  8:46 AM, 10/24/20 Wyman Songster PT, DPT Physical Therapist at [facility]

## 2020-10-24 NOTE — Consult Note (Signed)
@LOGO @   Referring Provider: [facility]  Primary Care Physician:  , MD Primary Gastroenterologist:  Dr. Suzan Slick  Date of Admission: 10/23/2020 Date of Consultation: 10/24/2020  Reason for Consultation: Abnormal esophagus on CT  HPI:  Kathryn [name] is a 80 y.o. year old female with history of acid reflux, HTN, achalasia who presented to the ED with chief complaint of generalized weakness x2 weeks.  Also reporting symmetric shoulder and hip pain, with torn rotator cuff on the right.  ED course: Tachycardic with heart rate 106-118, respiratory rate 16-24, BP 154/76, satting 98%, temp 98.6. No leukocytosis, hemoglobin 11.4, slight hyponatremia at 133, AST elevated at 76, albumin mildly decreased at 3.1. TSH within normal limits. CK 1206 Sed rate elevated at 44. CRP within normal limits. Lactic acid within normal limits. Vitamin B12 590 Troponin flat at 5 and 6 EKG shows a heart rate of 108, sinus tach, QTC 455 UA is indicative of UTI.   Blood cultures ordered. D-dimer was elevated. CTA with no evidence of PE, evidence of pulmonary hypertension and possible esophagitis versus esophageal stricture or mass. Chest x-ray with no acute findings.  She was admitted with generalized weakness and rhabdomyolysis.  She is started on IV fluids, Rocephin for UTI.  GI consulted due to esophageal abnormality on CT.  Today: Came to the ED due to 2 weeks of weakness, fatigue, and soreness/stiffness in her joints.    Intermittent dysphagia x1 week. Occurred with chicken. No trouble with soft foods. No trouble with liquids. No food regurgitation. Feels she gets full quickly. Hasn't had an appetite for about 3-4 months. Has acid reflux that is well controlled on Protonix 40 mg daily. No abdominal pain. No nausea or vomiting. Bowels move well with OTC colace daily (200 mg). No blood in the stool. Had 1 dark stool last week after she took 94. States the stool wasn't completely  black. States years ago, she had low hemoglobin. States her iron was low. Took iron for a while, but not on iron currently. No pepto bismol.  EGD years ago. Can't remember the findings.  Colonoscopy many years ago. [facility]. No history of polyps.   Denies any medication management or intervention for achalasia.   Takes BC powders for headache. Less than weekly. Uses tylenol for pain as needed.  No alcohol.   Manometry in September 2014 revealing elevated lower esophageal sphincter pressure at 75.9 and elevated lower esophageal sphincter relaxation pressure at 25.4.  No esophageal peristalsis.  Past Medical History:  Diagnosis Date  . Acid reflux   . Diabetes (HCC)    controlled with diet.   October 2014 HTN (hypertension)     Past Surgical History:  Procedure Laterality Date  . ABDOMINAL HYSTERECTOMY    . CHOLECYSTECTOMY    . Histosalpingogram      Prior to Admission medications   Medication Sig Start Date End Date Taking? Authorizing Provider  aspirin 81 MG tablet Take 81 mg by mouth daily.   Yes [provider]  losartan (COZAAR) 50 MG tablet Take 50 mg by mouth daily. 09/26/20  Yes [provider]  pantoprazole (PROTONIX) 40 MG tablet Take 40 mg by mouth daily.   Yes [provider]  triamcinolone ointment (KENALOG) 0.1 % Apply topically. 09/26/20  Yes [provider]    Current Facility-Administered Medications  Medication Dose Route Frequency Provider Last Rate Last Admin  . acetaminophen (TYLENOL) tablet 650 mg  650 mg Oral Q6H PRN Zierle-Ghosh, Asia B, DO  Or  . acetaminophen (TYLENOL) suppository 650 mg  650 mg Rectal Q6H PRN Zierle-Ghosh, Asia B, DO      . aspirin EC tablet 81 mg  81 mg Oral Daily Zierle-Ghosh, Asia B, DO      . cefTRIAXone (ROCEPHIN) 1 g in sodium chloride 0.9 % 100 mL IVPB  1 g Intravenous Q24H Zierle-Ghosh, Asia B, DO   Stopped at 10/23/20 2011  . heparin injection 5,000 Units  5,000 Units Subcutaneous Q8H  Zierle-Ghosh, Asia B, DO   5,000 Units at 10/24/20 0449  . lactated ringers infusion   Intravenous Continuous Zierle-Ghosh, Asia B, DO 150 mL/hr at 10/24/20 0416 New Bag at 10/24/20 0416  . losartan (COZAAR) tablet 50 mg  50 mg Oral Daily Zierle-Ghosh, Asia B, DO   50 mg at 10/24/20 0913  . ondansetron (ZOFRAN) tablet 4 mg  4 mg Oral Q6H PRN Zierle-Ghosh, Asia B, DO       Or  . ondansetron (ZOFRAN) injection 4 mg  4 mg Intravenous Q6H PRN Zierle-Ghosh, Asia B, DO      . oxyCODONE (Oxy IR/ROXICODONE) immediate release tablet 5 mg  5 mg Oral Q4H PRN Zierle-Ghosh, Asia B, DO      . pantoprazole (PROTONIX) injection 40 mg  40 mg Intravenous Q12H Zierle-Ghosh, Asia B, DO   40 mg at 10/24/20 0912    Allergies as of 10/23/2020 - Review Complete 10/23/2020  Allergen Reaction Noted  . Cyclobenzaprine hcl Swelling     Family History  Problem Relation Age of Onset  . Cancer Other   . Diabetes Other   . Arthritis Other   . Colon cancer Father        diagnosed in his late 35s.  . Esophageal cancer Neg Hx   . Stomach cancer Neg Hx     Social History   Socioeconomic History  . Marital status: Widowed    Spouse name: Not on file  . Number of children: Not on file  . Years of education: Not on file  . Highest education level: Not on file  Occupational History  . Not on file  Tobacco Use  . Smoking status: Never Smoker  . Smokeless tobacco: Never Used  Vaping Use  . Vaping Use: Never used  Substance and Sexual Activity  . Alcohol use: No  . Drug use: No  . Sexual activity: Never  Other Topics Concern  . Not on file  Social History Narrative  . Not on file   Social Determinants of Health   Financial Resource Strain: Not on file  Food Insecurity: Not on file  Transportation Needs: Not on file  Physical Activity: Not on file  Stress: Not on file  Social Connections: Not on file  Intimate Partner Violence: Not on file    Review of Systems: Gen: Denies fever, chills, cold or  flulike symptoms, presyncope, syncope. CV: Denies chest pain or palpitations. Resp: Denies shortness of breath or cough. GI: See HPI MS: See HPI Psych: Denies depression or anxiety Heme: See HPI  Physical Exam: Vital signs in last 24 hours: Temp:  [97.8 F (36.6 C)-99.1 F (37.3 C)] 98 F (36.7 C) (04/29 0624) Pulse Rate:  [83-118] 92 (04/29 0624) Resp:  [15-24] 15 (04/29 0624) BP: (147-188)/(69-98) 155/77 (04/29 0624) SpO2:  [98 %-100 %] 99 % (04/29 0624) Weight:  [59.9 kg] 59.9 kg (04/28 1422) Last BM Date: 10/22/20 General:   Alert,  Well-developed, well-nourished, pleasant and cooperative in NAD Head:  Normocephalic and atraumatic.  Eyes:  Sclera clear, no icterus.   Conjunctiva pink. Ears:  Normal auditory acuity. Lungs:  Clear throughout to auscultation.   No wheezes, crackles, or rhonchi. No acute distress. Heart:  Regular rate and rhythm; no murmurs, clicks, rubs,  or gallops. Abdomen:  Soft, nontender and nondistended. No masses, hepatosplenomegaly or hernias noted. Normal bowel sounds, without guarding, and without rebound.   Rectal:  Deferred  Msk:  Symmetrical without gross deformities. Normal posture. Extremities:  Without edema. Neurologic:  Alert and  oriented x4;  grossly normal neurologically. Skin:  Intact without significant lesions or rashes. Psych: Normal mood and affect.  Intake/Output from previous day: 04/28 0701 - 04/29 0700 In: 1700.6 [I.V.:1101.3; IV Piggyback:599.4] Out: 0  Intake/Output this shift: No intake/output data recorded.  Lab Results: Recent Labs    10/23/20 1448 10/24/20 0441  WBC 7.9 6.7  HGB 11.4* 10.5*  HCT 34.4* 32.2*  PLT 278 254   BMET Recent Labs    10/23/20 1448 10/24/20 0441  NA 133* 133*  K 4.2 3.9  CL 101 103  CO2 24 23  GLUCOSE 111* 76  BUN 13 9  CREATININE 0.80 0.60  CALCIUM 9.2 8.7*   LFT Recent Labs    10/23/20 1448 10/24/20 0441  PROT 8.1 6.7  ALBUMIN 3.1* 2.5*  AST 76* 60*  ALT 37 29   ALKPHOS 83 69  BILITOT 0.5 0.8   Studies/Results: DG Chest 2 View  Result Date: 10/23/2020 CLINICAL DATA:  Tachycardia. EXAM: CHEST - 2 VIEW COMPARISON:  None. FINDINGS: The heart size and mediastinal contours are within normal limits. Both lungs are clear. The visualized skeletal structures are unremarkable. IMPRESSION: No active cardiopulmonary disease. Electronically Signed   By: Lupita RaiderJames  Green Jr M.D.   On: 10/23/2020 16:48   CT Angio Chest PE W and/or Wo Contrast  Result Date: 10/23/2020 CLINICAL DATA:  PE suspected, low/intermediate prob, positive D-dimer Tachycardia, fatigue, positive D-dimer Patient reports weakness and lack of energy for 2 weeks. EXAM: CT ANGIOGRAPHY CHEST WITH CONTRAST TECHNIQUE: Multidetector CT imaging of the chest was performed using the standard protocol during bolus administration of intravenous contrast. Multiplanar CT image reconstructions and MIPs were obtained to evaluate the vascular anatomy. CONTRAST:  75mL OMNIPAQUE IOHEXOL 350 MG/ML SOLN COMPARISON:  Radiograph earlier today. FINDINGS: Cardiovascular: There are no filling defects within the pulmonary arteries to suggest pulmonary embolus. Dilated main pulmonary artery at 3.4 cm. Mild aortic atherosclerosis without dissection or acute aortic findings. Mild aortic tortuosity without aneurysm. Normal heart size. Pericardial effusion. Occasional coronary artery calcifications. Mediastinum/Nodes: No enlarged mediastinal or hilar lymph nodes. The upper esophagus is patulous and contains bubbly debris/ingested contents. There is wall thickening of the midesophagus just distal to the esophageal dilatation. No Paris off a gel adenopathy. No thyroid nodule. Lungs/Pleura: Minor subsegmental atelectasis in the lower lobes. No confluent consolidation. No pleural fluid. No pulmonary edema. Trachea and central bronchi are patent. No nodule or pulmonary mass. Upper Abdomen: No acute or unexpected findings. Musculoskeletal: Diffuse  osteopenia/osteoporosis. Endplate irregularity to L1-L2 is only partially included in the field of view, presumably degenerative. No focal bone lesion. Review of the MIP images confirms the above findings. IMPRESSION: 1. No pulmonary embolus. 2. Dilated main pulmonary artery suggesting pulmonary arterial hypertension. 3. Patulous upper esophagus containing bubbly debris/ingested contents. Mild mid esophageal wall thickening just distal to the esophageal dilatation. This may represent esophagitis, however the possibility of stricture or mass is also considered. Recommend further evaluation with endoscopy. Aortic Atherosclerosis (ICD10-I70.0). Electronically  Signed   By: Narda Rutherford M.D.   On: 10/23/2020 18:35    Impression: 80 year old female with history of acid reflux, HTN, achalasia, diabetes controlled with diet who presented to the ED with chief complaint of generalized weakness x2 weeks and was found to have elevated CK of 1206, elevated sed rate 44, elevated D-dimer, slight anemia with hemoglobin 11.4.  CTA with no evidence of PE.  She did have some suggestion of pulmonary artery hypertension and patulous upper esophagus containing bubbly debris/ingested contents, mild mid esophageal wall thickening just distal to the esophageal dilation which could represent esophagitis versus stricture versus mass.  Abnormal esophagus on CT: Patient reports intermittent solid food dysphagia x1 week.  No trouble with soft foods or liquids.  Also with decreased appetite and early satiety for the last 3 to 4 months with associated weight loss. Chronic history of GERD that is well controlled on Protonix 40 mg daily.  Denies nausea, vomiting, or abdominal pain.  Admits to 1 dark, but not black stool last week.  Occasional BC powder for headache, less than once weekly. Notably, she does have history of achalasia with manometry in 2014 revealing elevated lower esophageal sphincter pressure at 75.9 and elevated lower  esophageal sphincter relaxation pressure at 25.4.  No esophageal peristalsis.  Patient denies having any sort of surgery, Botox therapy, or taking any medications for achalasia.  She will need EGD for further evaluation of esophageal abnormalities noted on CT and dysphagia.  This will also help to evaluate her early satiety. We will plan for this today. She had a few sips of water this morning, otherwise, no PO intake.   Anemia: Hemoglobin 11.4 on admission with normocytic indices.  Last hemoglobin for comparison was 12.8 in 2019.  Hemoglobin Down to 10.5 today, likely secondary to hemodilution.  No overt GI bleeding at this time.  She does report 1 dark, but not black stool last week after taking Geritol.  Occasional BC powder, less than once weekly.  No prior EGD.  Last colonoscopy years ago.  Denies history of polyps. We are pursuing EGD to further evaluate abnormal esophagus on CT and dysphagia as per above. Will also check iron studies. Continue to monitor hemoglobin and for overt GI bleeding.   Unintentional weight loss: Documented 14 pound weight loss over the last 15 months.  7 pound weight loss over the last 5 months.  GI symptoms include early satiety for several months and intermittent dysphagia x1 month.  CT angio chest with abnormal esophagus as described above for which we will be pursuing EGD for further evaluation.  Ultimately, she may need additional evaluation of her weight loss including CT abdomen and pelvis and possibly a colonoscopy.  She reports last colonoscopy was years ago in [place].  Denies history of polyps.   Plan: 1.  EGD +/- with Dr. Jena Gauss today. The risks, benefits, and alternatives have been discussed with the patient in detail. The patient states understanding and desires to proceed.  * Patient did receive heparin at 4:49 this morning. Heparin has been discontinued in preparation for EGD this afternoon.   2.  Check ferritin, iron, TIBC.  3.  Monitor H&H.  4.   Monitor for overt GI bleeding.  5.  Continue PPI daily  6.  Pending EGD findings, she may warrant further evaluation of her unintentional weight loss and early satiety.     LOS: 0 days    10/24/2020, 9:53 AM   Ermalinda Memos, PA-C [facility]

## 2020-10-24 NOTE — Progress Notes (Signed)
PROGRESS NOTE    Kathryn [name]  UKG:[id number] DOB: Nov 28, 1940 DOA: 10/23/2020 PCP: Leeanne Rio, MD    Brief Narrative:  80 year old female with a history of hypertension, who is brought to the hospital with generalized weakness.  She reports 2-week history of fatigue, discomfort and weakness in her shoulders and hips bilaterally.  She has had decreased appetite, fatigue, weight loss.  CT scan showed abnormal esophagus.  Urine culture showed possible UTI.  She was admitted for GI work-up, antibiotics for possible UTI.  She was also noted to have elevated CK.   Assessment & Plan:   Active Problems:   UTI (urinary tract infection)   Elevated d-dimer   Tachycardia   Abnormal computed tomography of esophagus   Mild protein-calorie malnutrition (HCC)   Rhabdomyolysis   HTN (hypertension)   Loss of weight   Dysphagia   Early satiety   Possible UTI -Urine culture pending -Continue on Rocephin  Proximal muscle weakness -Describes pain in shoulders bilaterally and hips bilaterally -Symptoms have been present for about 2 weeks -ESR elevated at 44 -Creatine kinase also elevated at 1206 -She also has nonspecific symptoms of fatigue, anorexia, weight loss -Clinically, her symptoms may be related to polymyalgia rheumatica versus polymyositis -ANA panel and rheumatoid factor has been sent -We will give a trial of low-dose prednisone -Denies any history of headache, jaw pain, changes in vision  Mild rhabdomyolysis -Continue IV fluids -TSH normal  Hypertension -Continue losartan  Abnormal CT esophagus -Seen by GI and underwent EGD which did not appear to have any significant findings -Continue on PPI  Anemia, normocytic -Iron studies have been ordered   DVT prophylaxis: SCDs Start: 10/23/20 2127  Code Status: Partial code, no intubation Family Communication: Discussed with patient Disposition Plan: Status is: Observation  The patient will require care spanning > 2  midnights and should be moved to inpatient because: Ongoing diagnostic testing needed not appropriate for outpatient work up  Dispo: The patient is from: Home              Anticipated d/c is to: Home              Patient currently is not medically stable to d/c.   Difficult to place patient No         Consultants:   GI  Procedures:  EGD:  - Normal esophagus. Dilated.                           - Small hiatal hernia.                           - The examination was otherwise normal.                           - Normal duodenal bulb and second portion of the                            duodenum.                           - No specimens collected.                           -History of achalasia diagnosed by manometry  previously. Certainly, today the esophagus did not                             appear typical of someone with advanced achalasia.  Antimicrobials:   Ceftriaxone 4/28 >   Subjective: Complains of continued pain/soreness in her shoulders bilaterally as well as her hips bilaterally.  Also feels weakness, proximally in these joints  Objective: Vitals:   10/24/20 1405 10/24/20 1410 10/24/20 1413 10/24/20 1512  BP: (!) 220/112 (!) 201/85 (!) 198/70 (!) 156/83  Pulse: (!) 123 (!) 112 (!) 106 96  Resp: (!) 21 (!) 26 (!) 22 18  Temp:    98.2 F (36.8 C)  TempSrc:    Oral  SpO2: 100% 99% 100% 100%  Weight:      Height:        Intake/Output Summary (Last 24 hours) at 10/24/2020 1804 Last data filed at 10/24/2020 0313 Gross per 24 hour  Intake 1700.64 ml  Output 0 ml  Net 1700.64 ml   Filed Weights   10/23/20 1422 10/24/20 1249  Weight: 59.9 kg 56.7 kg    Examination:  General exam: Appears calm and comfortable  Respiratory system: Clear to auscultation. Respiratory effort normal. Cardiovascular system: S1 & S2 heard, RRR. No JVD, murmurs, rubs, gallops or clicks. No pedal edema. Gastrointestinal system: Abdomen is nondistended,  soft and nontender. No organomegaly or masses felt. Normal bowel sounds heard. Central nervous system: Alert and oriented. No focal neurological deficits. No tenderness to palpation over temples b/l Extremities: Symmetric 5 x 5 power. Skin: No rashes, lesions or ulcers Psychiatry: Judgement and insight appear normal. Mood & affect appropriate.     Data Reviewed: I have personally reviewed following labs and imaging studies  CBC: Recent Labs  Lab 10/23/20 1448 10/24/20 0441  WBC 7.9 6.7  NEUTROABS 6.5 5.3  HGB 11.4* 10.5*  HCT 34.4* 32.2*  MCV 96.1 95.5  PLT 278 740   Basic Metabolic Panel: Recent Labs  Lab 10/23/20 1448 10/24/20 0441  NA 133* 133*  K 4.2 3.9  CL 101 103  CO2 24 23  GLUCOSE 111* 76  BUN 13 9  CREATININE 0.80 0.60  CALCIUM 9.2 8.7*  MG  --  1.8   GFR: Estimated Creatinine Clearance: 45.6 mL/min (by C-G formula based on SCr of 0.6 mg/dL). Liver Function Tests: Recent Labs  Lab 10/23/20 1448 10/24/20 0441  AST 76* 60*  ALT 37 29  ALKPHOS 83 69  BILITOT 0.5 0.8  PROT 8.1 6.7  ALBUMIN 3.1* 2.5*   No results for input(s): LIPASE, AMYLASE in the last 168 hours. No results for input(s): AMMONIA in the last 168 hours. Coagulation Profile: No results for input(s): INR, PROTIME in the last 168 hours. Cardiac Enzymes: Recent Labs  Lab 10/23/20 1448 10/24/20 0441  CKTOTAL 1,206* 1,042*   BNP (last 3 results) No results for input(s): PROBNP in the last 8760 hours. HbA1C: No results for input(s): HGBA1C in the last 72 hours. CBG: Recent Labs  Lab 10/24/20 1309 10/24/20 1341  GLUCAP 61* 81   Lipid Profile: No results for input(s): CHOL, HDL, LDLCALC, TRIG, CHOLHDL, LDLDIRECT in the last 72 hours. Thyroid Function Tests: Recent Labs    10/24/20 0441  TSH 0.962   Anemia Panel: Recent Labs    10/23/20 1448 10/24/20 0441  VITAMINB12 590  --   FERRITIN  --  752*  TIBC  --  149*  IRON  --  44   Sepsis Labs: Recent Labs  Lab  10/23/20 1915 10/23/20 2102  LATICACIDVEN 1.4 1.1    Recent Results (from the past 240 hour(s))  Blood culture (routine x 2)     Status: None (Preliminary result)   Collection Time: 10/23/20  7:15 PM   Specimen: Right Antecubital; Blood  Result Value Ref Range Status   Specimen Description RIGHT ANTECUBITAL  Final   Special Requests   Final    BOTTLES DRAWN AEROBIC AND ANAEROBIC Blood Culture adequate volume   Culture   Final    NO GROWTH < 12 HOURS Performed at [facility], [address] [place], [place] [address]    Report Status PENDING  Incomplete  Blood culture (routine x 2)     Status: None (Preliminary result)   Collection Time: 10/23/20  7:28 PM   Specimen: BLOOD RIGHT FOREARM  Result Value Ref Range Status   Specimen Description BLOOD RIGHT FOREARM  Final   Special Requests   Final    BOTTLES DRAWN AEROBIC AND ANAEROBIC Blood Culture adequate volume   Culture   Final    NO GROWTH < 12 HOURS Performed at [facility], [address] [place], [place] [address]    Report Status PENDING  Incomplete  Resp Panel by RT-PCR (Flu A&B, Covid) Nasopharyngeal Swab     Status: None   Collection Time: 10/23/20  7:41 PM   Specimen: Nasopharyngeal Swab; Nasopharyngeal(NP) swabs in vial transport medium  Result Value Ref Range Status   SARS Coronavirus 2 by RT PCR NEGATIVE NEGATIVE Final    Comment: (NOTE) SARS-CoV-2 target nucleic acids are NOT DETECTED.  The SARS-CoV-2 RNA is generally detectable in upper respiratory specimens during the acute phase of infection. The lowest concentration of SARS-CoV-2 viral copies this assay can detect is 138 copies/mL. A negative result does not preclude SARS-Cov-2 infection and should not be used as the sole basis for treatment or other patient management decisions. A negative result may occur with  improper specimen collection/handling, submission of specimen other than nasopharyngeal swab, presence of viral mutation(s) within the areas  targeted by this assay, and inadequate number of viral copies(<138 copies/mL). A negative result must be combined with clinical observations, patient history, and epidemiological information. The expected result is Negative.  Fact Sheet for Patients:  EntrepreneurPulse.com.au  Fact Sheet for Healthcare Providers:  IncredibleEmployment.be  This test is no t yet approved or cleared by the [place] FDA and  has been authorized for detection and/or diagnosis of SARS-CoV-2 by FDA under an Emergency Use Authorization (EUA). This EUA will remain  in effect (meaning this test can be used) for the duration of the COVID-19 declaration under Section 564(b)(1) of the Act, 21 U.S.C.section 360bbb-3(b)(1), unless the authorization is terminated  or revoked sooner.       Influenza A by PCR NEGATIVE NEGATIVE Final   Influenza B by PCR NEGATIVE NEGATIVE Final    Comment: (NOTE) The Xpert Xpress SARS-CoV-2/FLU/RSV plus assay is intended as an aid in the diagnosis of influenza from Nasopharyngeal swab specimens and should not be used as a sole basis for treatment. Nasal washings and aspirates are unacceptable for Xpert Xpress SARS-CoV-2/FLU/RSV testing.  Fact Sheet for Patients: EntrepreneurPulse.com.au  Fact Sheet for Healthcare Providers: IncredibleEmployment.be  This test is not yet approved or cleared by the [place] FDA and has been authorized for detection and/or diagnosis of SARS-CoV-2 by FDA under an Emergency Use Authorization (EUA). This EUA will remain in effect (meaning this  test can be used) for the duration of the COVID-19 declaration under Section 564(b)(1) of the Act, 21 U.S.C. section 360bbb-3(b)(1), unless the authorization is terminated or revoked.  Performed at [facility], [address], [place], [place] [address]   Surgical pcr screen     Status: None   Collection Time: 10/24/20 11:38 AM    Specimen: Nasal Mucosa; Nasal Swab  Result Value Ref Range Status   MRSA, PCR NEGATIVE NEGATIVE Final   Staphylococcus aureus NEGATIVE NEGATIVE Final    Comment: (NOTE) The Xpert SA Assay (FDA approved for NASAL specimens in patients 46 years of age and older), is one component of a comprehensive surveillance program. It is not intended to diagnose infection nor to guide or monitor treatment. Performed at [facility], [address] [place], [place] [address]          Radiology Studies: DG Chest 2 View  Result Date: 10/23/2020 CLINICAL DATA:  Tachycardia. EXAM: CHEST - 2 VIEW COMPARISON:  None. FINDINGS: The heart size and mediastinal contours are within normal limits. Both lungs are clear. The visualized skeletal structures are unremarkable. IMPRESSION: No active cardiopulmonary disease. Electronically Signed   By: Marijo Conception M.D.   On: 10/23/2020 16:48   CT Angio Chest PE W and/or Wo Contrast  Result Date: 10/23/2020 CLINICAL DATA:  PE suspected, low/intermediate prob, positive D-dimer Tachycardia, fatigue, positive D-dimer Patient reports weakness and lack of energy for 2 weeks. EXAM: CT ANGIOGRAPHY CHEST WITH CONTRAST TECHNIQUE: Multidetector CT imaging of the chest was performed using the standard protocol during bolus administration of intravenous contrast. Multiplanar CT image reconstructions and MIPs were obtained to evaluate the vascular anatomy. CONTRAST:  73m OMNIPAQUE IOHEXOL 350 MG/ML SOLN COMPARISON:  Radiograph earlier today. FINDINGS: Cardiovascular: There are no filling defects within the pulmonary arteries to suggest pulmonary embolus. Dilated main pulmonary artery at 3.4 cm. Mild aortic atherosclerosis without dissection or acute aortic findings. Mild aortic tortuosity without aneurysm. Normal heart size. Pericardial effusion. Occasional coronary artery calcifications. Mediastinum/Nodes: No enlarged mediastinal or hilar lymph nodes. The upper esophagus is patulous  and contains bubbly debris/ingested contents. There is wall thickening of the midesophagus just distal to the esophageal dilatation. No [place] off a gel adenopathy. No thyroid nodule. Lungs/Pleura: Minor subsegmental atelectasis in the lower lobes. No confluent consolidation. No pleural fluid. No pulmonary edema. Trachea and central bronchi are patent. No nodule or pulmonary mass. Upper Abdomen: No acute or unexpected findings. Musculoskeletal: Diffuse osteopenia/osteoporosis. Endplate irregularity to L1-L2 is only partially included in the field of view, presumably degenerative. No focal bone lesion. Review of the MIP images confirms the above findings. IMPRESSION: 1. No pulmonary embolus. 2. Dilated main pulmonary artery suggesting pulmonary arterial hypertension. 3. Patulous upper esophagus containing bubbly debris/ingested contents. Mild mid esophageal wall thickening just distal to the esophageal dilatation. This may represent esophagitis, however the possibility of stricture or mass is also considered. Recommend further evaluation with endoscopy. Aortic Atherosclerosis (ICD10-I70.0). Electronically Signed   By: MKeith RakeM.D.   On: 10/23/2020 18:35        Scheduled Meds: . aspirin EC  81 mg Oral Daily  . dextrose      . lidocaine      . losartan  50 mg Oral Daily  . meperidine      . metoprolol tartrate  25 mg Oral BID  . midazolam      . ondansetron      . pantoprazole  40 mg Oral Daily  Continuous Infusions: . cefTRIAXone (ROCEPHIN)  IV Stopped (10/23/20 2011)     LOS: 0 days    Time spent: 67mns    JKathie Dike MD [facility]   If 7PM-7AM, please contact night-coverage [email or web address]  10/24/2020, 6:04 PM

## 2020-10-25 DIAGNOSIS — I10 Essential (primary) hypertension: Secondary | ICD-10-CM

## 2020-10-25 LAB — ENA+DNA/DS+ANTICH+CENTRO+JO...
Anti JO-1: <0.2 AI (ref 0.0–0.9)
Centromere Ab Screen: <0.2 AI (ref 0.0–0.9)
Chromatin Ab SerPl-aCnc: 0.2 AI (ref 0.0–0.9)
ENA SM Ab Ser-aCnc: <0.2 AI (ref 0.0–0.9)
Ribonucleic Protein: 0.2 AI (ref 0.0–0.9)
SSA (Ro) (ENA) Antibody, IgG: >8 AI — ABNORMAL HIGH (ref 0.0–0.9)
SSB (La) (ENA) Antibody, IgG: <0.2 AI (ref 0.0–0.9)
Scleroderma (Scl-70) (ENA) Antibody, IgG: <0.2 AI (ref 0.0–0.9)
ds DNA Ab: 1 IU/mL (ref 0–9)

## 2020-10-25 LAB — COMPREHENSIVE METABOLIC PANEL
ALT: 32 U/L (ref 0–44)
AST: 63 U/L — ABNORMAL HIGH (ref 15–41)
Albumin: 2.6 g/dL — ABNORMAL LOW (ref 3.5–5.0)
Alkaline Phosphatase: 67 U/L (ref 38–126)
Anion gap: 6 (ref 5–15)
BUN: 8 mg/dL (ref 8–23)
CO2: 23 mmol/L (ref 22–32)
Calcium: 8.7 mg/dL — ABNORMAL LOW (ref 8.9–10.3)
Chloride: 104 mmol/L (ref 98–111)
Creatinine, Ser: 0.63 mg/dL (ref 0.44–1.00)
GFR, Estimated: >60 mL/min (ref >60)
Glucose, Bld: 118 mg/dL — ABNORMAL HIGH (ref 70–99)
Potassium: 4.3 mmol/L (ref 3.5–5.1)
Sodium: 133 mmol/L — ABNORMAL LOW (ref 135–145)
Total Bilirubin: 0.5 mg/dL (ref 0.3–1.2)
Total Protein: 7.1 g/dL (ref 6.5–8.1)

## 2020-10-25 LAB — CBC
HCT: 31.9 % — ABNORMAL LOW (ref 36.0–46.0)
Hemoglobin: 10.3 g/dL — ABNORMAL LOW (ref 12.0–15.0)
MCH: 30.9 pg (ref 26.0–34.0)
MCHC: 32.3 g/dL (ref 30.0–36.0)
MCV: 95.8 fL (ref 80.0–100.0)
Platelets: 255 10*3/uL (ref 150–400)
RBC: 3.33 MIL/uL — ABNORMAL LOW (ref 3.87–5.11)
RDW: 13.4 % (ref 11.5–15.5)
WBC: 7.7 10*3/uL (ref 4.0–10.5)
nRBC: 0 % (ref 0.0–0.2)

## 2020-10-25 LAB — ANA W/REFLEX IF POSITIVE: Anti Nuclear Antibody (ANA): POSITIVE — AB

## 2020-10-25 LAB — RHEUMATOID FACTOR: Rheumatoid fact SerPl-aCnc: <10 IU/mL (ref <14.0)

## 2020-10-25 LAB — URINE CULTURE

## 2020-10-25 MED ORDER — METOPROLOL TARTRATE 25 MG PO TABS: 25.0000 mg | ORAL_TABLET | Freq: Two times a day (BID) | ORAL | 0 refills | Status: DC

## 2020-10-25 MED ORDER — PREDNISONE 5 MG PO TABS: 15.0000 mg | ORAL_TABLET | Freq: Every day | ORAL | 0 refills | Status: DC

## 2020-10-25 NOTE — Progress Notes (Deleted)
Physician Discharge Summary  Kathryn [name] VEH:[id number] DOB: 15-Feb-1941 DOA: 10/23/2020  PCP: Leeanne Rio, MD  Admit date: 10/23/2020 Discharge date: 10/25/2020  Admitted From: Home Disposition: Home  Recommendations for Outpatient Follow-up:  1. Follow up with PCP in 1-2 weeks 2. Please obtain BMP/CBC in one week 3. Referral has been made to rheumatology for positive anti-Ro ab  Home Health: Outpatient PT Equipment/Devices:  Discharge Condition: Improved CODE STATUS: Full code Diet recommendation: Heart healthy  Brief/Interim Summary: 80 year old female with a history of hypertension, who is brought to the hospital with generalized weakness.  She reports 2-week history of fatigue, discomfort and weakness in her shoulders and hips bilaterally.  She has had decreased appetite, fatigue, weight loss.  CT scan showed abnormal esophagus.  Urine culture showed possible UTI.  She was admitted for GI work-up, antibiotics for possible UTI.  She was also noted to have elevated CK.  Discharge Diagnoses:  Active Problems:   UTI (urinary tract infection)   Elevated d-dimer   Tachycardia   Abnormal computed tomography of esophagus   Mild protein-calorie malnutrition (HCC)   Rhabdomyolysis   HTN (hypertension)   Loss of weight   Dysphagia   Early satiety  Possible UTI -Urine culture did not show any specific growth -Antibiotics discontinued  Proximal muscle weakness/pain -Describes pain in shoulders bilaterally and hips bilaterally -Symptoms have been present for about 2 weeks -ESR elevated at 44 -Creatine kinase also elevated at 1206 -She also has nonspecific symptoms of fatigue, anorexia, weight loss -Clinically, her symptoms may be related to polymyalgia rheumatica  -Patient was started on low-dose prednisone with improvement of her joint pains, appetite -We will continue this for 2 weeks -Denies any history of headache, jaw pain, changes in vision -ANA panel was sent  which was found to be positive -Further reflex antibody panel showed that she was positive for anti-Ro antibody -Unclear significance of this finding -Referral has been made to rheumatology for further follow-up  Mild rhabdomyolysis -Treated with IV fluids -TSH normal  Hypertension -Continue losartan -Started on metoprolol  Abnormal CT esophagus -Seen by GI and underwent EGD which did not appear to have any significant findings -She underwent esophageal dilatation which has improved her swallowing -Continue on PPI  Anemia, normocytic -Iron studies indicated anemia of chronic disease -Hemoglobin has been stable  Discharge Instructions  Discharge Instructions    Ambulatory referral to Physical Therapy   Complete by: As directed    Out patient PT /[place]   Ambulatory referral to Rheumatology   Complete by: As directed    Suspected polymyalgia rheumatica   Diet - low sodium heart healthy   Complete by: As directed    Increase activity slowly   Complete by: As directed      Allergies as of 10/25/2020      Reactions   Cyclobenzaprine Hcl Swelling      Medication List    TAKE these medications   aspirin 81 MG tablet Take 81 mg by mouth daily.   losartan 50 MG tablet Commonly known as: COZAAR Take 50 mg by mouth daily.   metoprolol tartrate 25 MG tablet Commonly known as: LOPRESSOR Take 1 tablet (25 mg total) by mouth 2 (two) times daily.   pantoprazole 40 MG tablet Commonly known as: PROTONIX Take 40 mg by mouth daily.   predniSONE 5 MG tablet Commonly known as: DELTASONE Take 3 tablets (15 mg total) by mouth daily with breakfast. Start taking on: Oct 26, 2020   triamcinolone  ointment 0.1 % Commonly known as: KENALOG Apply topically.       Follow-up Information    Schedule an appointment as soon as possible for a visit  with Your GI doctor.        Go to  [facility]   Why: As needed, If symptoms worsen Contact information: [address] [phone]       Leeanne Rio, MD. Schedule an appointment as soon as possible for a visit in 2 week(s).   Specialty: Family Medicine Contact information: [place] [phone]        [facility]. Schedule an appointment as soon as possible for a visit.   Why: Order sent for outpatient rehab.  Contact information: [phone] [address], [place], [place] [address]             Allergies  Allergen Reactions  . Cyclobenzaprine Hcl Swelling    Consultations:  GI   Procedures/Studies: DG Chest 2 View  Result Date: 10/23/2020 CLINICAL DATA:  Tachycardia. EXAM: CHEST - 2 VIEW COMPARISON:  None. FINDINGS: The heart size and mediastinal contours are within normal limits. Both lungs are clear. The visualized skeletal structures are unremarkable. IMPRESSION: No active cardiopulmonary disease. Electronically Signed   By: Marijo Conception M.D.   On: 10/23/2020 16:48   CT Angio Chest PE W and/or Wo Contrast  Result Date: 10/23/2020 CLINICAL DATA:  PE suspected, low/intermediate prob, positive D-dimer Tachycardia, fatigue, positive D-dimer Patient reports weakness and lack of energy for 2 weeks. EXAM: CT ANGIOGRAPHY CHEST WITH CONTRAST TECHNIQUE: Multidetector CT imaging of the chest was performed using the standard protocol during bolus administration of intravenous contrast. Multiplanar CT image reconstructions and MIPs were obtained to evaluate the vascular anatomy. CONTRAST:  40m OMNIPAQUE IOHEXOL 350 MG/ML SOLN COMPARISON:  Radiograph earlier today. FINDINGS: Cardiovascular: There are no filling defects within the pulmonary arteries to suggest pulmonary embolus. Dilated main pulmonary artery at 3.4 cm. Mild aortic atherosclerosis without dissection or acute aortic findings. Mild aortic tortuosity without aneurysm. Normal heart size. Pericardial effusion. Occasional coronary artery  calcifications. Mediastinum/Nodes: No enlarged mediastinal or hilar lymph nodes. The upper esophagus is patulous and contains bubbly debris/ingested contents. There is wall thickening of the midesophagus just distal to the esophageal dilatation. No [place] off a gel adenopathy. No thyroid nodule. Lungs/Pleura: Minor subsegmental atelectasis in the lower lobes. No confluent consolidation. No pleural fluid. No pulmonary edema. Trachea and central bronchi are patent. No nodule or pulmonary mass. Upper Abdomen: No acute or unexpected findings. Musculoskeletal: Diffuse osteopenia/osteoporosis. Endplate irregularity to L1-L2 is only partially included in the field of view, presumably degenerative. No focal bone lesion. Review of the MIP images confirms the above findings. IMPRESSION: 1. No pulmonary embolus. 2. Dilated main pulmonary artery suggesting pulmonary arterial hypertension. 3. Patulous upper esophagus containing bubbly debris/ingested contents. Mild mid esophageal wall thickening just distal to the esophageal dilatation. This may represent esophagitis, however the possibility of stricture or mass is also considered. Recommend further evaluation with endoscopy. Aortic Atherosclerosis (ICD10-I70.0). Electronically Signed   By: MKeith RakeM.D.   On: 10/23/2020 18:35       Subjective:  Feeling better.  Joint pains have improved.  Appetite is improved.  Discharge Exam: Vitals:   10/24/20 1832 10/24/20 2116 10/25/20 0452 10/25/20 1447  BP: (!) 150/76 124/71 (!) 142/72 (!) 151/76  Pulse: 98 77 81 78  Resp:  _0 Temp: 98 F (36.7 C) 98.2 F (36.8 C) 98.5 F (36.9 C) 98 F (36.7 C)  TempSrc:   Oral Oral  SpO2: 99% 96% 100% 100%  Weight:      Height:        General: Pt is alert, awake, not in acute distress Cardiovascular: RRR, S1/S2 +, no rubs, no gallops Respiratory: CTA bilaterally, no wheezing, no rhonchi Abdominal: Soft, NT, ND, bowel sounds + Extremities: no edema, no  cyanosis    The results of significant diagnostics from this hospitalization (including imaging, microbiology, ancillary and laboratory) are listed below for reference.     Microbiology: Recent Results (from the past 240 hour(s))  Urine culture     Status: Abnormal   Collection Time: 10/23/20  6:09 PM   Specimen: Urine, Clean Catch  Result Value Ref Range Status   Specimen Description   Final    URINE, CLEAN CATCH Performed at [facility], [address] [place], [place] [address]    Special Requests   Final    NONE Performed at [facility], [address], [place], [place] [address]    Culture MULTIPLE SPECIES PRESENT, SUGGEST RECOLLECTION (A)  Final   Report Status 10/25/2020 FINAL  Final  Blood culture (routine x 2)     Status: None (Preliminary result)   Collection Time: 10/23/20  7:15 PM   Specimen: Right Antecubital; Blood  Result Value Ref Range Status   Specimen Description RIGHT ANTECUBITAL  Final   Special Requests   Final    BOTTLES DRAWN AEROBIC AND ANAEROBIC Blood Culture adequate volume   Culture   Final    NO GROWTH 2 DAYS Performed at [facility], [address] [place], [place] [address]    Report Status PENDING  Incomplete  Blood culture (routine x 2)     Status: None (Preliminary result)   Collection Time: 10/23/20  7:28 PM   Specimen: BLOOD RIGHT FOREARM  Result Value Ref Range Status   Specimen Description BLOOD RIGHT FOREARM  Final   Special Requests   Final    BOTTLES DRAWN AEROBIC AND ANAEROBIC Blood Culture adequate volume   Culture   Final    NO GROWTH 2 DAYS Performed at [facility], [address], [place], [place] [address]    Report Status PENDING  Incomplete  Resp Panel by RT-PCR (Flu A&B, Covid) Nasopharyngeal Swab     Status: None   Collection Time: 10/23/20  7:41 PM   Specimen: Nasopharyngeal Swab; Nasopharyngeal(NP) swabs in vial transport medium  Result Value Ref Range Status   SARS Coronavirus 2 by RT PCR NEGATIVE NEGATIVE  Final    Comment: (NOTE) SARS-CoV-2 target nucleic acids are NOT DETECTED.  The SARS-CoV-2 RNA is generally detectable in upper respiratory specimens during the acute phase of infection. The lowest concentration of SARS-CoV-2 viral copies this assay can detect is 138 copies/mL. A negative result does not preclude SARS-Cov-2 infection and should not be used as the sole basis for treatment or other patient management decisions. A negative result may occur with  improper specimen collection/handling, submission of specimen other than nasopharyngeal swab, presence of viral mutation(s) within the areas targeted by this assay, and inadequate number of viral copies(<138 copies/mL). A negative result must be combined with clinical observations, patient history, and epidemiological information. The expected result is Negative.  Fact Sheet for Patients:  EntrepreneurPulse.com.au  Fact Sheet for Healthcare Providers:  IncredibleEmployment.be  This test is no t yet approved or  cleared by the [place] and  has been authorized for detection and/or diagnosis of SARS-CoV-2 by FDA under an Emergency Use Authorization (EUA). This EUA will remain  in effect (meaning this test can be used) for the duration of the COVID-19 declaration under Section 564(b)(1) of the Act, 21 U.S.C.section 360bbb-3(b)(1), unless the authorization is terminated  or revoked sooner.       Influenza A by PCR NEGATIVE NEGATIVE Final   Influenza B by PCR NEGATIVE NEGATIVE Final    Comment: (NOTE) The Xpert Xpress SARS-CoV-2/FLU/RSV plus assay is intended as an aid in the diagnosis of influenza from Nasopharyngeal swab specimens and should not be used as a sole basis for treatment. Nasal washings and aspirates are unacceptable for Xpert Xpress SARS-CoV-2/FLU/RSV testing.  Fact Sheet for Patients: EntrepreneurPulse.com.au  Fact Sheet for Healthcare  Providers: IncredibleEmployment.be  This test is not yet approved or cleared by the [place] FDA and has been authorized for detection and/or diagnosis of SARS-CoV-2 by FDA under an Emergency Use Authorization (EUA). This EUA will remain in effect (meaning this test can be used) for the duration of the COVID-19 declaration under Section 564(b)(1) of the Act, 21 U.S.C. section 360bbb-3(b)(1), unless the authorization is terminated or revoked.  Performed at [facility], [address] [place], [place] [address]   Culture, Urine     Status: Abnormal   Collection Time: 10/23/20 10:00 PM   Specimen: Urine, Clean Catch  Result Value Ref Range Status   Specimen Description   Final    URINE, CLEAN CATCH Performed at [facility], [address] [place], [place] [address]    Special Requests   Final    NONE Performed at [facility], [address], [place], [place] [address]    Culture MULTIPLE SPECIES PRESENT, SUGGEST RECOLLECTION (A)  Final   Report Status 10/25/2020 FINAL  Final  Surgical pcr screen     Status: None   Collection Time: 10/24/20 11:38 AM   Specimen: Nasal Mucosa; Nasal Swab  Result Value Ref Range Status   MRSA, PCR NEGATIVE NEGATIVE Final   Staphylococcus aureus NEGATIVE NEGATIVE Final    Comment: (NOTE) The Xpert SA Assay (FDA approved for NASAL specimens in patients 28 years of age and older), is one component of a comprehensive surveillance program. It is not intended to diagnose infection nor to guide or monitor treatment. Performed at [facility], [address], [place], [place] [address]      Labs: BNP (last 3 results) No results for input(s): BNP in the last 8760 hours. Basic Metabolic Panel: Recent Labs  Lab 10/23/20 1448 10/24/20 0441 10/25/20 0427  NA 133* 133* 133*  K 4.2 3.9 4.3  CL 101 103 104  CO2 _0 GLUCOSE 111* 76 118*  BUN _1 CREATININE 0.80 0.60 0.63  CALCIUM 9.2 8.7* 8.7*  MG  --  1.8  --     Liver Function Tests: Recent Labs  Lab 10/23/20 1448 10/24/20 0441 10/25/20 0427  AST 76* 60* 63*  ALT 37 29 32  ALKPHOS 83 69 67  BILITOT 0.5 0.8 0.5  PROT 8.1 6.7 7.1  ALBUMIN 3.1* 2.5* 2.6*   No results for input(s): LIPASE, AMYLASE in the last 168 hours. No results for input(s): AMMONIA in the last 168 hours. CBC: Recent Labs  Lab 10/23/20 1448 10/24/20 0441 10/25/20 0427  WBC 7.9 6.7 7.7  NEUTROABS 6.5 5.3  --   HGB 11.4* 10.5* 10.3*  HCT 34.4* 32.2* 31.9*  MCV 96.1 95.5 95.8  PLT 278 254 255   Cardiac Enzymes: Recent Labs  Lab 10/23/20 1448 10/24/20 0441  CKTOTAL 1,206* 1,042*   BNP: Invalid input(s): POCBNP CBG: Recent Labs  Lab 10/24/20 1309 10/24/20 1341  GLUCAP 61* 81   D-Dimer Recent Labs    10/23/20 1448  DDIMER 2.60*   Hgb A1c No results for input(s): HGBA1C in the last 72 hours. Lipid Profile No results for input(s): CHOL, HDL, LDLCALC, TRIG, CHOLHDL, LDLDIRECT in the last 72 hours. Thyroid function studies Recent Labs    10/24/20 0441  TSH 0.962   Anemia work up Recent Labs    10/23/20 1448 10/24/20 0441  VITAMINB12 590  --   FERRITIN  --  752*  TIBC  --  149*  IRON  --  44   Urinalysis    Component Value Date/Time   COLORURINE YELLOW 10/23/2020 1630   APPEARANCEUR CLOUDY (A) 10/23/2020 1630   LABSPEC 1.015 10/23/2020 1630   PHURINE 7.0 10/23/2020 1630   GLUCOSEU NEGATIVE 10/23/2020 1630   HGBUR NEGATIVE 10/23/2020 1630   BILIRUBINUR NEGATIVE 10/23/2020 1630   KETONESUR 5 (A) 10/23/2020 1630   PROTEINUR 30 (A) 10/23/2020 1630   NITRITE NEGATIVE 10/23/2020 1630   LEUKOCYTESUR LARGE (A) 10/23/2020 1630   Sepsis Labs Invalid input(s): PROCALCITONIN,  WBC,  LACTICIDVEN Microbiology Recent Results (from the past 240 hour(s))  Urine culture     Status: Abnormal   Collection Time: 10/23/20  6:09 PM   Specimen: Urine, Clean Catch  Result Value Ref Range Status   Specimen Description   Final    URINE, CLEAN  CATCH Performed at [facility], [address], [place], [place] [address]    Special Requests   Final    NONE Performed at [facility], [address] [place], [place] [address]    [place], SUGGEST RECOLLECTION (A)  Final   Report Status 10/25/2020 FINAL  Final  Blood culture (routine x 2)     Status: None (Preliminary result)   Collection Time: 10/23/20  7:15 PM   Specimen: Right Antecubital; Blood  Result Value Ref Range Status   Specimen Description RIGHT ANTECUBITAL  Final   Special Requests   Final    BOTTLES DRAWN AEROBIC AND ANAEROBIC Blood Culture adequate volume   Culture   Final    NO GROWTH 2 DAYS Performed at [facility], [address], [place], [place] [address]    Report Status PENDING  Incomplete  Blood culture (routine x 2)     Status: None (Preliminary result)   Collection Time: 10/23/20  7:28 PM   Specimen: BLOOD RIGHT FOREARM  Result Value Ref Range Status   Specimen Description BLOOD RIGHT FOREARM  Final   Special Requests   Final    BOTTLES DRAWN AEROBIC AND ANAEROBIC Blood Culture adequate volume   Culture   Final    NO GROWTH 2 DAYS Performed at [facility], [address], [place], [place] [address]    Report Status PENDING  Incomplete  Resp Panel by RT-PCR (Flu A&B, Covid) Nasopharyngeal Swab     Status: None   Collection Time: 10/23/20  7:41 PM   Specimen: Nasopharyngeal Swab; Nasopharyngeal(NP) swabs in vial transport medium  Result Value Ref Range Status   SARS Coronavirus 2 by RT PCR NEGATIVE NEGATIVE Final    Comment: (NOTE) SARS-CoV-2 target nucleic acids are NOT DETECTED.  The SARS-CoV-2 RNA is generally detectable in upper respiratory  specimens during the acute phase of infection. The lowest concentration of SARS-CoV-2 viral copies this assay can detect is 138 copies/mL. A negative result does not preclude SARS-Cov-2 infection and should not be used as the sole basis for treatment or other patient management  decisions. A negative result may occur with  improper specimen collection/handling, submission of specimen other than nasopharyngeal swab, presence of viral mutation(s) within the areas targeted by this assay, and inadequate number of viral copies(<138 copies/mL). A negative result must be combined with clinical observations, patient history, and epidemiological information. The expected result is Negative.  Fact Sheet for Patients:  EntrepreneurPulse.com.au  Fact Sheet for Healthcare Providers:  IncredibleEmployment.be  This test is no t yet approved or cleared by the [place] FDA and  has been authorized for detection and/or diagnosis of SARS-CoV-2 by FDA under an Emergency Use Authorization (EUA). This EUA will remain  in effect (meaning this test can be used) for the duration of the COVID-19 declaration under Section 564(b)(1) of the Act, 21 U.S.C.section 360bbb-3(b)(1), unless the authorization is terminated  or revoked sooner.       Influenza A by PCR NEGATIVE NEGATIVE Final   Influenza B by PCR NEGATIVE NEGATIVE Final    Comment: (NOTE) The Xpert Xpress SARS-CoV-2/FLU/RSV plus assay is intended as an aid in the diagnosis of influenza from Nasopharyngeal swab specimens and should not be used as a sole basis for treatment. Nasal washings and aspirates are unacceptable for Xpert Xpress SARS-CoV-2/FLU/RSV testing.  Fact Sheet for Patients: EntrepreneurPulse.com.au  Fact Sheet for Healthcare Providers: IncredibleEmployment.be  This test is not yet approved or cleared by the [place] FDA and has been authorized for detection and/or diagnosis of SARS-CoV-2 by FDA under an Emergency Use Authorization (EUA). This EUA will remain in effect (meaning this test can be used) for the duration of the COVID-19 declaration under Section 564(b)(1) of the Act, 21 U.S.C. section 360bbb-3(b)(1), unless the  authorization is terminated or revoked.  Performed at [facility], [address], [place], [place] [address]   Culture, Urine     Status: Abnormal   Collection Time: 10/23/20 10:00 PM   Specimen: Urine, Clean Catch  Result Value Ref Range Status   Specimen Description   Final    URINE, CLEAN CATCH Performed at [facility], [address] [place], [place] [address]    Special Requests   Final    NONE Performed at [facility], [address] [place], [place] [address]    Culture MULTIPLE SPECIES PRESENT, SUGGEST RECOLLECTION (A)  Final   Report Status 10/25/2020 FINAL  Final  Surgical pcr screen     Status: None   Collection Time: 10/24/20 11:38 AM   Specimen: Nasal Mucosa; Nasal Swab  Result Value Ref Range Status   MRSA, PCR NEGATIVE NEGATIVE Final   Staphylococcus aureus NEGATIVE NEGATIVE Final    Comment: (NOTE) The Xpert SA Assay (FDA approved for NASAL specimens in patients 68 years of age and older), is one component of a comprehensive surveillance program. It is not intended to diagnose infection nor to guide or monitor treatment. Performed at [facility], [address], [place], [place] [address]      Time coordinating discharge: 34mns  SIGNED:   JKathie Dike MD  [facility] 10/25/2020, 7:48 PM   If 7PM-7AM, please contact night-coverage [email or web address]

## 2020-10-25 NOTE — Discharge Summary (Signed)
Physician Discharge Summary  Kathryn [name] VEH:[id number] DOB: 15-Feb-1941 DOA: 10/23/2020  PCP: Leeanne Rio, MD  Admit date: 10/23/2020 Discharge date: 10/25/2020  Admitted From: Home Disposition: Home  Recommendations for Outpatient Follow-up:  1. Follow up with PCP in 1-2 weeks 2. Please obtain BMP/CBC in one week 3. Referral has been made to rheumatology for positive anti-Ro ab  Home Health: Outpatient PT Equipment/Devices:  Discharge Condition: Improved CODE STATUS: Full code Diet recommendation: Heart healthy  Brief/Interim Summary: 80 year old female with a history of hypertension, who is brought to the hospital with generalized weakness.  She reports 2-week history of fatigue, discomfort and weakness in her shoulders and hips bilaterally.  She has had decreased appetite, fatigue, weight loss.  CT scan showed abnormal esophagus.  Urine culture showed possible UTI.  She was admitted for GI work-up, antibiotics for possible UTI.  She was also noted to have elevated CK.  Discharge Diagnoses:  Active Problems:   UTI (urinary tract infection)   Elevated d-dimer   Tachycardia   Abnormal computed tomography of esophagus   Mild protein-calorie malnutrition (HCC)   Rhabdomyolysis   HTN (hypertension)   Loss of weight   Dysphagia   Early satiety  Possible UTI -Urine culture did not show any specific growth -Antibiotics discontinued  Proximal muscle weakness/pain -Describes pain in shoulders bilaterally and hips bilaterally -Symptoms have been present for about 2 weeks -ESR elevated at 44 -Creatine kinase also elevated at 1206 -She also has nonspecific symptoms of fatigue, anorexia, weight loss -Clinically, her symptoms may be related to polymyalgia rheumatica  -Patient was started on low-dose prednisone with improvement of her joint pains, appetite -We will continue this for 2 weeks -Denies any history of headache, jaw pain, changes in vision -ANA panel was sent  which was found to be positive -Further reflex antibody panel showed that she was positive for anti-Ro antibody -Unclear significance of this finding -Referral has been made to rheumatology for further follow-up  Mild rhabdomyolysis -Treated with IV fluids -TSH normal  Hypertension -Continue losartan -Started on metoprolol  Abnormal CT esophagus -Seen by GI and underwent EGD which did not appear to have any significant findings -She underwent esophageal dilatation which has improved her swallowing -Continue on PPI  Anemia, normocytic -Iron studies indicated anemia of chronic disease -Hemoglobin has been stable  Discharge Instructions  Discharge Instructions    Ambulatory referral to Physical Therapy   Complete by: As directed    Out patient PT /[place]   Ambulatory referral to Rheumatology   Complete by: As directed    Suspected polymyalgia rheumatica   Diet - low sodium heart healthy   Complete by: As directed    Increase activity slowly   Complete by: As directed      Allergies as of 10/25/2020      Reactions   Cyclobenzaprine Hcl Swelling      Medication List    TAKE these medications   aspirin 81 MG tablet Take 81 mg by mouth daily.   losartan 50 MG tablet Commonly known as: COZAAR Take 50 mg by mouth daily.   metoprolol tartrate 25 MG tablet Commonly known as: LOPRESSOR Take 1 tablet (25 mg total) by mouth 2 (two) times daily.   pantoprazole 40 MG tablet Commonly known as: PROTONIX Take 40 mg by mouth daily.   predniSONE 5 MG tablet Commonly known as: DELTASONE Take 3 tablets (15 mg total) by mouth daily with breakfast. Start taking on: Oct 26, 2020   triamcinolone  ointment 0.1 % Commonly known as: KENALOG Apply topically.       Follow-up Information    Schedule an appointment as soon as possible for a visit  with Your GI doctor.        Go to  [facility].   Why: As needed, If symptoms worsen Contact information: [address] [phone]       Leeanne Rio, MD. Schedule an appointment as soon as possible for a visit in 2 week(s).   Specialty: Family Medicine Contact information: [place] [address] [phone]        [facility]. Schedule an appointment as soon as possible for a visit.   Why: Order sent for outpatient rehab.  Contact information: [phone] [address], [place], [place] [address]             Allergies  Allergen Reactions  . Cyclobenzaprine Hcl Swelling    Consultations:  GI   Procedures/Studies: DG Chest 2 View  Result Date: 10/23/2020 CLINICAL DATA:  Tachycardia. EXAM: CHEST - 2 VIEW COMPARISON:  None. FINDINGS: The heart size and mediastinal contours are within normal limits. Both lungs are clear. The visualized skeletal structures are unremarkable. IMPRESSION: No active cardiopulmonary disease. Electronically Signed   By: Marijo Conception M.D.   On: 10/23/2020 16:48   CT Angio Chest PE W and/or Wo Contrast  Result Date: 10/23/2020 CLINICAL DATA:  PE suspected, low/intermediate prob, positive D-dimer Tachycardia, fatigue, positive D-dimer Patient reports weakness and lack of energy for 2 weeks. EXAM: CT ANGIOGRAPHY CHEST WITH CONTRAST TECHNIQUE: Multidetector CT imaging of the chest was performed using the standard protocol during bolus administration of intravenous contrast. Multiplanar CT image reconstructions and MIPs were obtained to evaluate the vascular anatomy. CONTRAST:  69mL OMNIPAQUE IOHEXOL 350 MG/ML SOLN COMPARISON:  Radiograph earlier today. FINDINGS: Cardiovascular: There are no filling defects within the pulmonary arteries to suggest pulmonary embolus. Dilated main pulmonary artery at 3.4 cm. Mild aortic atherosclerosis without dissection or acute aortic findings. Mild aortic tortuosity without aneurysm. Normal heart size. Pericardial effusion. Occasional coronary artery  calcifications. Mediastinum/Nodes: No enlarged mediastinal or hilar lymph nodes. The upper esophagus is patulous and contains bubbly debris/ingested contents. There is wall thickening of the midesophagus just distal to the esophageal dilatation. No [place] off a gel adenopathy. No thyroid nodule. Lungs/Pleura: Minor subsegmental atelectasis in the lower lobes. No confluent consolidation. No pleural fluid. No pulmonary edema. Trachea and central bronchi are patent. No nodule or pulmonary mass. Upper Abdomen: No acute or unexpected findings. Musculoskeletal: Diffuse osteopenia/osteoporosis. Endplate irregularity to L1-L2 is only partially included in the field of view, presumably degenerative. No focal bone lesion. Review of the MIP images confirms the above findings. IMPRESSION: 1. No pulmonary embolus. 2. Dilated main pulmonary artery suggesting pulmonary arterial hypertension. 3. Patulous upper esophagus containing bubbly debris/ingested contents. Mild mid esophageal wall thickening just distal to the esophageal dilatation. This may represent esophagitis, however the possibility of stricture or mass is also considered. Recommend further evaluation with endoscopy. Aortic Atherosclerosis (ICD10-I70.0). Electronically Signed   By: Keith Rake M.D.   On: 10/23/2020 18:35      Subjective:  Feeling better.  Joint pains have improved.  Appetite is improved.  Discharge Exam: Vitals:   10/24/20 1832 10/24/20 2116 10/25/20 0452 10/25/20 1447  BP: (!) 150/76 124/71 (!) 142/72 (!) 151/76  Pulse: 98 77 81 78  Resp:  _0 Temp: 98 F (36.7 C) 98.2 F (36.8 C) 98.5 F (36.9 C) 98 F (36.7 C)  TempSrc:   Oral Oral  SpO2: 99% 96% 100% 100%  Weight:      Height:        General: Pt is alert, awake, not in acute distress Cardiovascular: RRR, S1/S2 +, no rubs, no gallops Respiratory: CTA bilaterally, no wheezing, no rhonchi Abdominal: Soft, NT, ND, bowel sounds + Extremities: no edema, no  cyanosis    The results of significant diagnostics from this hospitalization (including imaging, microbiology, ancillary and laboratory) are listed below for reference.     Microbiology: Recent Results (from the past 240 hour(s))  Urine culture     Status: Abnormal   Collection Time: 10/23/20  6:09 PM   Specimen: Urine, Clean Catch  Result Value Ref Range Status   Specimen Description   Final    URINE, CLEAN CATCH Performed at [facility], [address] [place], [place] [address]    Special Requests   Final    NONE Performed at [facility], [address], [place], [place] [address]    Culture MULTIPLE SPECIES PRESENT, SUGGEST RECOLLECTION (A)  Final   Report Status 10/25/2020 FINAL  Final  Blood culture (routine x 2)     Status: None (Preliminary result)   Collection Time: 10/23/20  7:15 PM   Specimen: Right Antecubital; Blood  Result Value Ref Range Status   Specimen Description RIGHT ANTECUBITAL  Final   Special Requests   Final    BOTTLES DRAWN AEROBIC AND ANAEROBIC Blood Culture adequate volume   Culture   Final    NO GROWTH 2 DAYS Performed at [facility], [address] [place], [place] [address]    Report Status PENDING  Incomplete  Blood culture (routine x 2)     Status: None (Preliminary result)   Collection Time: 10/23/20  7:28 PM   Specimen: BLOOD RIGHT FOREARM  Result Value Ref Range Status   Specimen Description BLOOD RIGHT FOREARM  Final   Special Requests   Final    BOTTLES DRAWN AEROBIC AND ANAEROBIC Blood Culture adequate volume   Culture   Final    NO GROWTH 2 DAYS Performed at [facility], [address], [place], [place] [address]    Report Status PENDING  Incomplete  Resp Panel by RT-PCR (Flu A&B, Covid) Nasopharyngeal Swab     Status: None   Collection Time: 10/23/20  7:41 PM   Specimen: Nasopharyngeal Swab; Nasopharyngeal(NP) swabs in vial transport medium  Result Value Ref Range Status   SARS Coronavirus 2 by RT PCR NEGATIVE NEGATIVE  Final    Comment: (NOTE) SARS-CoV-2 target nucleic acids are NOT DETECTED.  The SARS-CoV-2 RNA is generally detectable in upper respiratory specimens during the acute phase of infection. The lowest concentration of SARS-CoV-2 viral copies this assay can detect is 138 copies/mL. A negative result does not preclude SARS-Cov-2 infection and should not be used as the sole basis for treatment or other patient management decisions. A negative result may occur with  improper specimen collection/handling, submission of specimen other than nasopharyngeal swab, presence of viral mutation(s) within the areas targeted by this assay, and inadequate number of viral copies(<138 copies/mL). A negative result must be combined with clinical observations, patient history, and epidemiological information. The expected result is Negative.  Fact Sheet for Patients:  EntrepreneurPulse.com.au  Fact Sheet for Healthcare Providers:  IncredibleEmployment.be  This test is no t yet approved or  cleared by the [place] and  has been authorized for detection and/or diagnosis of SARS-CoV-2 by FDA under an Emergency Use Authorization (EUA). This EUA will remain  in effect (meaning this test can be used) for the duration of the COVID-19 declaration under Section 564(b)(1) of the Act, 21 U.S.C.section 360bbb-3(b)(1), unless the authorization is terminated  or revoked sooner.       Influenza A by PCR NEGATIVE NEGATIVE Final   Influenza B by PCR NEGATIVE NEGATIVE Final    Comment: (NOTE) The Xpert Xpress SARS-CoV-2/FLU/RSV plus assay is intended as an aid in the diagnosis of influenza from Nasopharyngeal swab specimens and should not be used as a sole basis for treatment. Nasal washings and aspirates are unacceptable for Xpert Xpress SARS-CoV-2/FLU/RSV testing.  Fact Sheet for Patients: EntrepreneurPulse.com.au  Fact Sheet for Healthcare  Providers: IncredibleEmployment.be  This test is not yet approved or cleared by the [place] FDA and has been authorized for detection and/or diagnosis of SARS-CoV-2 by FDA under an Emergency Use Authorization (EUA). This EUA will remain in effect (meaning this test can be used) for the duration of the COVID-19 declaration under Section 564(b)(1) of the Act, 21 U.S.C. section 360bbb-3(b)(1), unless the authorization is terminated or revoked.  Performed at [facility], [address] [place], [place] [address]   Culture, Urine     Status: Abnormal   Collection Time: 10/23/20 10:00 PM   Specimen: Urine, Clean Catch  Result Value Ref Range Status   Specimen Description   Final    URINE, CLEAN CATCH Performed at [facility], [address] [place], [place] [address]    Special Requests   Final    NONE Performed at [facility], [address], [place], [place] [address]    Culture MULTIPLE SPECIES PRESENT, SUGGEST RECOLLECTION (A)  Final   Report Status 10/25/2020 FINAL  Final  Surgical pcr screen     Status: None   Collection Time: 10/24/20 11:38 AM   Specimen: Nasal Mucosa; Nasal Swab  Result Value Ref Range Status   MRSA, PCR NEGATIVE NEGATIVE Final   Staphylococcus aureus NEGATIVE NEGATIVE Final    Comment: (NOTE) The Xpert SA Assay (FDA approved for NASAL specimens in patients 28 years of age and older), is one component of a comprehensive surveillance program. It is not intended to diagnose infection nor to guide or monitor treatment. Performed at [facility], [address], [place], [place] [address]      Labs: BNP (last 3 results) No results for input(s): BNP in the last 8760 hours. Basic Metabolic Panel: Recent Labs  Lab 10/23/20 1448 10/24/20 0441 10/25/20 0427  NA 133* 133* 133*  K 4.2 3.9 4.3  CL 101 103 104  CO2 _0 GLUCOSE 111* 76 118*  BUN _1 CREATININE 0.80 0.60 0.63  CALCIUM 9.2 8.7* 8.7*  MG  --  1.8  --     Liver Function Tests: Recent Labs  Lab 10/23/20 1448 10/24/20 0441 10/25/20 0427  AST 76* 60* 63*  ALT 37 29 32  ALKPHOS 83 69 67  BILITOT 0.5 0.8 0.5  PROT 8.1 6.7 7.1  ALBUMIN 3.1* 2.5* 2.6*   No results for input(s): LIPASE, AMYLASE in the last 168 hours. No results for input(s): AMMONIA in the last 168 hours. CBC: Recent Labs  Lab 10/23/20 1448 10/24/20 0441 10/25/20 0427  WBC 7.9 6.7 7.7  NEUTROABS 6.5 5.3  --   HGB 11.4* 10.5* 10.3*  HCT 34.4* 32.2* 31.9*  MCV 96.1 95.5 95.8  PLT 278 254 255   Cardiac Enzymes: Recent Labs  Lab 10/23/20 1448 10/24/20 0441  CKTOTAL 1,206* 1,042*   BNP: Invalid input(s): POCBNP CBG: Recent Labs  Lab 10/24/20 1309 10/24/20 1341  GLUCAP 61* 81   D-Dimer Recent Labs    10/23/20 1448  DDIMER 2.60*   Hgb A1c No results for input(s): HGBA1C in the last 72 hours. Lipid Profile No results for input(s): CHOL, HDL, LDLCALC, TRIG, CHOLHDL, LDLDIRECT in the last 72 hours. Thyroid function studies Recent Labs    10/24/20 0441  TSH 0.962   Anemia work up Recent Labs    10/23/20 1448 10/24/20 0441  VITAMINB12 590  --   FERRITIN  --  752*  TIBC  --  149*  IRON  --  44   Urinalysis    Component Value Date/Time   COLORURINE YELLOW 10/23/2020 1630   APPEARANCEUR CLOUDY (A) 10/23/2020 1630   LABSPEC 1.015 10/23/2020 1630   PHURINE 7.0 10/23/2020 1630   GLUCOSEU NEGATIVE 10/23/2020 1630   HGBUR NEGATIVE 10/23/2020 1630   BILIRUBINUR NEGATIVE 10/23/2020 1630   KETONESUR 5 (A) 10/23/2020 1630   PROTEINUR 30 (A) 10/23/2020 1630   NITRITE NEGATIVE 10/23/2020 1630   LEUKOCYTESUR LARGE (A) 10/23/2020 1630   Sepsis Labs Invalid input(s): PROCALCITONIN,  WBC,  LACTICIDVEN Microbiology Recent Results (from the past 240 hour(s))  Urine culture     Status: Abnormal   Collection Time: 10/23/20  6:09 PM   Specimen: Urine, Clean Catch  Result Value Ref Range Status   Specimen Description   Final    URINE, CLEAN  CATCH Performed at [facility], [address], [place], [place] [address]    Special Requests   Final    NONE Performed at [facility], [address] [place], [place] [address]    [place], SUGGEST RECOLLECTION (A)  Final   Report Status 10/25/2020 FINAL  Final  Blood culture (routine x 2)     Status: None (Preliminary result)   Collection Time: 10/23/20  7:15 PM   Specimen: Right Antecubital; Blood  Result Value Ref Range Status   Specimen Description RIGHT ANTECUBITAL  Final   Special Requests   Final    BOTTLES DRAWN AEROBIC AND ANAEROBIC Blood Culture adequate volume   Culture   Final    NO GROWTH 2 DAYS Performed at [facility], [address], [place], [place] [address]    Report Status PENDING  Incomplete  Blood culture (routine x 2)     Status: None (Preliminary result)   Collection Time: 10/23/20  7:28 PM   Specimen: BLOOD RIGHT FOREARM  Result Value Ref Range Status   Specimen Description BLOOD RIGHT FOREARM  Final   Special Requests   Final    BOTTLES DRAWN AEROBIC AND ANAEROBIC Blood Culture adequate volume   Culture   Final    NO GROWTH 2 DAYS Performed at [facility], [address], [place], [place] [address]    Report Status PENDING  Incomplete  Resp Panel by RT-PCR (Flu A&B, Covid) Nasopharyngeal Swab     Status: None   Collection Time: 10/23/20  7:41 PM   Specimen: Nasopharyngeal Swab; Nasopharyngeal(NP) swabs in vial transport medium  Result Value Ref Range Status   SARS Coronavirus 2 by RT PCR NEGATIVE NEGATIVE Final    Comment: (NOTE) SARS-CoV-2 target nucleic acids are NOT DETECTED.  The SARS-CoV-2 RNA is generally detectable in upper respiratory  specimens during the acute phase of infection. The lowest concentration of SARS-CoV-2 viral copies this assay can detect is 138 copies/mL. A negative result does not preclude SARS-Cov-2 infection and should not be used as the sole basis for treatment or other patient management  decisions. A negative result may occur with  improper specimen collection/handling, submission of specimen other than nasopharyngeal swab, presence of viral mutation(s) within the areas targeted by this assay, and inadequate number of viral copies(<138 copies/mL). A negative result must be combined with clinical observations, patient history, and epidemiological information. The expected result is Negative.  Fact Sheet for Patients:  EntrepreneurPulse.com.au  Fact Sheet for Healthcare Providers:  IncredibleEmployment.be  This test is no t yet approved or cleared by the [place] FDA and  has been authorized for detection and/or diagnosis of SARS-CoV-2 by FDA under an Emergency Use Authorization (EUA). This EUA will remain  in effect (meaning this test can be used) for the duration of the COVID-19 declaration under Section 564(b)(1) of the Act, 21 U.S.C.section 360bbb-3(b)(1), unless the authorization is terminated  or revoked sooner.       Influenza A by PCR NEGATIVE NEGATIVE Final   Influenza B by PCR NEGATIVE NEGATIVE Final    Comment: (NOTE) The Xpert Xpress SARS-CoV-2/FLU/RSV plus assay is intended as an aid in the diagnosis of influenza from Nasopharyngeal swab specimens and should not be used as a sole basis for treatment. Nasal washings and aspirates are unacceptable for Xpert Xpress SARS-CoV-2/FLU/RSV testing.  Fact Sheet for Patients: EntrepreneurPulse.com.au  Fact Sheet for Healthcare Providers: IncredibleEmployment.be  This test is not yet approved or cleared by the [place] FDA and has been authorized for detection and/or diagnosis of SARS-CoV-2 by FDA under an Emergency Use Authorization (EUA). This EUA will remain in effect (meaning this test can be used) for the duration of the COVID-19 declaration under Section 564(b)(1) of the Act, 21 U.S.C. section 360bbb-3(b)(1), unless the  authorization is terminated or revoked.  Performed at [facility], [address] [place], [place] [address]   Culture, Urine     Status: Abnormal   Collection Time: 10/23/20 10:00 PM   Specimen: Urine, Clean Catch  Result Value Ref Range Status   Specimen Description   Final    URINE, CLEAN CATCH Performed at [facility], [address] [place] [address], [place], [place] [address]    Special Requests   Final    NONE Performed at [facility], [address], [place], [place] [address]    Culture MULTIPLE SPECIES PRESENT, SUGGEST RECOLLECTION (A)  Final   Report Status 10/25/2020 FINAL  Final  Surgical pcr screen     Status: None   Collection Time: 10/24/20 11:38 AM   Specimen: Nasal Mucosa; Nasal Swab  Result Value Ref Range Status   MRSA, PCR NEGATIVE NEGATIVE Final   Staphylococcus aureus NEGATIVE NEGATIVE Final    Comment: (NOTE) The Xpert SA Assay (FDA approved for NASAL specimens in patients 64 years of age and older), is one component of a comprehensive surveillance program. It is not intended to diagnose infection nor to guide or monitor treatment. Performed at [facility], [address] [place], [place] [address]      Time coordinating discharge: 52mins  SIGNED:   [name], MD  [facility] 10/25/2020, 7:55 PM   If 7PM-7AM, please contact night-coverage [email or web address]

## 2020-10-25 NOTE — Progress Notes (Signed)
Patient reports swallowing well since having her esophagus dilated yesterday. No abdominal pain.  Vital signs in last 24 hours: Temp:  [98 F (36.7 C)-98.6 F (37 C)] 98.5 F (36.9 C) (04/30 0452) Pulse Rate:  [77-123] 81 (04/30 0452) Resp:  [15-26] 19 (04/30 0452) BP: (124-220)/(70-115) 142/72 (04/30 0452) SpO2:  [96 %-100 %] 100 % (04/30 0452) Weight:  [56.7 kg] 56.7 kg (04/29 1249) Last BM Date: 10/24/20 General:   Alert,   pleasant and cooperative in NAD; accompanied by her daughter. Abdomen:  Soft, nontender and nondistended.  Normal bowel sounds, without guarding, and without rebound.  No mass or organomegaly  Intake/Output from previous day: No intake/output data recorded. Intake/Output this shift: No intake/output data recorded.  Lab Results: Recent Labs    10/23/20 1448 10/24/20 0441 10/25/20 0427  WBC 7.9 6.7 7.7  HGB 11.4* 10.5* 10.3*  HCT 34.4* 32.2* 31.9*  PLT 278 254 255   BMET Recent Labs    10/23/20 1448 10/24/20 0441 10/25/20 0427  NA 133* 133* 133*  K 4.2 3.9 4.3  CL 101 103 104  CO2 24 23 23   GLUCOSE 111* 76 118*  BUN 13 9 8   CREATININE 0.80 0.60 0.63  CALCIUM 9.2 8.7* 8.7*   LFT Recent Labs    10/25/20 0427  PROT 7.1  ALBUMIN 2.6*  AST 63*  ALT 32  ALKPHOS 67  BILITOT 0.5   PT/INR No results for input(s): LABPROT, INR in the last 72 hours. Hepatitis Panel No results for input(s): HEPBSAG, HCVAB, HEPAIGM, HEPBIGM in the last 72 hours. C-Diff No results for input(s): CDIFFTOX in the last 72 hours.  Studies/Results: DG Chest 2 View  Result Date: 10/23/2020 CLINICAL DATA:  Tachycardia. EXAM: CHEST - 2 VIEW COMPARISON:  None. FINDINGS: The heart size and mediastinal contours are within normal limits. Both lungs are clear. The visualized skeletal structures are unremarkable. IMPRESSION: No active cardiopulmonary disease. Electronically Signed   By: 10/27/20 M.D.   On: 10/23/2020 16:48   CT Angio Chest PE W and/or Wo  Contrast  Result Date: 10/23/2020 CLINICAL DATA:  PE suspected, low/intermediate prob, positive D-dimer Tachycardia, fatigue, positive D-dimer Patient reports weakness and lack of energy for 2 weeks. EXAM: CT ANGIOGRAPHY CHEST WITH CONTRAST TECHNIQUE: Multidetector CT imaging of the chest was performed using the standard protocol during bolus administration of intravenous contrast. Multiplanar CT image reconstructions and MIPs were obtained to evaluate the vascular anatomy. CONTRAST:  37mL OMNIPAQUE IOHEXOL 350 MG/ML SOLN COMPARISON:  Radiograph earlier today. FINDINGS: Cardiovascular: There are no filling defects within the pulmonary arteries to suggest pulmonary embolus. Dilated main pulmonary artery at 3.4 cm. Mild aortic atherosclerosis without dissection or acute aortic findings. Mild aortic tortuosity without aneurysm. Normal heart size. Pericardial effusion. Occasional coronary artery calcifications. Mediastinum/Nodes: No enlarged mediastinal or hilar lymph nodes. The upper esophagus is patulous and contains bubbly debris/ingested contents. There is wall thickening of the midesophagus just distal to the esophageal dilatation. No Paris off a gel adenopathy. No thyroid nodule. Lungs/Pleura: Minor subsegmental atelectasis in the lower lobes. No confluent consolidation. No pleural fluid. No pulmonary edema. Trachea and central bronchi are patent. No nodule or pulmonary mass. Upper Abdomen: No acute or unexpected findings. Musculoskeletal: Diffuse osteopenia/osteoporosis. Endplate irregularity to L1-L2 is only partially included in the field of view, presumably degenerative. No focal bone lesion. Review of the MIP images confirms the above findings. IMPRESSION: 1. No pulmonary embolus. 2. Dilated main pulmonary artery suggesting pulmonary arterial hypertension. 3. Patulous  upper esophagus containing bubbly debris/ingested contents. Mild mid esophageal wall thickening just distal to the esophageal dilatation.  This may represent esophagitis, however the possibility of stricture or mass is also considered. Recommend further evaluation with endoscopy. Aortic Atherosclerosis (ICD10-I70.0). Electronically Signed   By: Narda Rutherford M.D.   On: 10/23/2020 18:35    Impression: Pleasant 80 year old lady with a reported history of achalasia diagnosed by esophageal manometry elsewhere several years ago admitted to the hospital with weakness, UTI and rhabdomyolysis. Abnormal esophagus implied on CT .  EGD demonstrated a relatively normal-appearing esophagus.  Large bore esophageal dilation performed empirically.  She seems to have already derived improvement following dilation.  Recommendations: Continue swallowing precautions.  Continue Protonix 40 mg daily.  Outpatient GI follow-up with either our group locally or the gastroenterologist she saw in [place] previously.  Hopefully, from a GI standpoint, she can be discharged soon.

## 2020-10-25 NOTE — Discharge Instructions (Signed)
Polymyalgia Rheumatica Polymyalgia rheumatica (PMR) is an inflammatory disorder that causes the muscles and joints to ache and become stiff. Sometimes, PMR leads to a more dangerous condition that can cause vision loss (temporal arteritis or giant cell arteritis). What are the causes? The exact cause of PMR is not known. What increases the risk? You are more likely to develop this condition if you are:  Female.  82 years of age or older.  Caucasian. What are the signs or symptoms? Pain and stiffness are the main symptoms of PMR. Symptoms may:  Be worse after inactivity and in the morning.  Affect your: ? Hips, buttocks, and thighs. ? Neck, arms, and shoulders. This can make it hard to raise your arms above your head. ? Hands and wrists. Other symptoms include:  Fever.  Tiredness.  Weakness.  Depression.  Decreased appetite. This may lead to weight loss. Symptoms may start slowly or suddenly. How is this diagnosed? This condition is diagnosed with your medical history and a physical exam. You may need to see a health care provider who specializes in diseases of the joints, muscles, and bones (rheumatologist). You may also have tests, including:  Blood tests.  X-rays.  Ultrasound.   How is this treated? PMR usually goes away without treatment, but it may take years. Your health care provider may recommend low-dose steroids and other medicines to help manage your symptoms of pain and stiffness. Regular exercise and rest will also help your symptoms. Follow these instructions at home:  Take over-the-counter and prescription medicines only as told by your health care provider.  Make sure to get enough rest and sleep.  Eat a healthy and nutritious diet.  Try to exercise most days of the week. Ask your health care provider what type of exercise is best for you.  Keep all follow-up visits as told by your health care provider. This is important.   Contact a health care  provider if:  Your symptoms do not improve with medicine.  You have side effects from steroids. These may include: ? Weight gain. ? Swelling. ? Insomnia. ? Mood changes. ? Bruising. ? High blood sugar readings, if you have diabetes. ? Higher than normal blood pressure readings, if you monitor your blood pressure. Get help right away if:  You develop symptoms of temporal arteritis, such as: ? A change in vision. ? Severe headache. ? Scalp pain. ? Jaw pain. Summary  Polymyalgia rheumatica is an inflammatory disorder that causes aching and stiffness in your muscles and joints.  The exact cause of this condition is not known.  This condition usually goes away without treatment. Your health care provider may give you low-dose steroids to help manage your pain and stiffness.  Rest and regular exercise will help the symptoms. This information is not intended to replace advice given to you by your health care provider. Make sure you discuss any questions you have with your health care provider. Document Revised: 04/20/2018 Document Reviewed: 04/20/2018 Elsevier Patient Education  2021 [employer].  

## 2020-10-27 ENCOUNTER — Encounter (HOSPITAL_COMMUNITY): Payer: Self-pay | Admitting: Internal Medicine

## 2020-10-28 LAB — CULTURE, BLOOD (ROUTINE X 2)
Culture: NO GROWTH
Culture: NO GROWTH
Special Requests: ADEQUATE
Special Requests: ADEQUATE

## 2020-10-30 ENCOUNTER — Encounter: Payer: Self-pay | Admitting: Orthopedic Surgery

## 2020-10-30 ENCOUNTER — Other Ambulatory Visit: Payer: Self-pay

## 2020-10-30 ENCOUNTER — Ambulatory Visit: Payer: Medicare Other

## 2020-10-30 ENCOUNTER — Ambulatory Visit: Payer: Medicare Other | Admitting: Orthopedic Surgery

## 2020-10-30 VITALS — BP 185/100 | HR 90 | Ht 63.0 in | Wt 125.4 lb

## 2020-10-30 DIAGNOSIS — M25511 Pain in right shoulder: Secondary | ICD-10-CM | POA: Diagnosis not present

## 2020-10-30 DIAGNOSIS — G8929 Other chronic pain: Secondary | ICD-10-CM

## 2020-10-30 NOTE — Patient Instructions (Signed)

## 2020-10-30 NOTE — Progress Notes (Addendum)
Kathryn [name]//Kathryn [name]  Summary assessment and plan:   80 year old female with a chronic rotator cuff tear complains of acute right shoulder pain  Injected right shoulder   Procedure note the subacromial injection shoulder RIGHT    Verbal consent was obtained to inject the  RIGHT   Shoulder  Timeout was completed to confirm the injection site is a subacromial space of the  RIGHT  shoulder   Medication used celestone mg and lidocaine 1% 3 cc  Anesthesia was provided by ethyl chloride  The injection was performed in the RIGHT  posterior subacromial space. After pinning the skin with alcohol and anesthetized the skin with ethyl chloride the subacromial space was injected using a 20-gauge needle. There were no complications  Sterile dressing was applied.    Follow-up as needed  Chief Complaint  Kathryn [name] presents with  . Shoulder Pain    Kathryn [name] reports right shoulder down to back.     80 year old female history of right rotator cuff tear, chronic with rotator cuff arthropathy  2 weeks ago she started having pain and increased she started to lose motion in the shoulder denies any trauma   Review of Systems  Constitutional: Negative for fever.  Skin: Negative for rash.  Neurological: Negative for tingling.     Past Medical History:  Diagnosis Date  . Acid reflux   . Diabetes (HCC)    controlled with diet.   Marland Kitchen HTN (hypertension)     Past Surgical History:  Procedure Laterality Date  . ABDOMINAL HYSTERECTOMY    . CHOLECYSTECTOMY    . ESOPHAGEAL DILATION N/A 10/24/2020   Procedure: ESOPHAGEAL DILATION;  Surgeon: Corbin Ade, MD;  Location: [facility];  Service: Endoscopy;  Laterality: N/A;  . ESOPHAGOGASTRODUODENOSCOPY N/A 10/24/2020   Procedure: ESOPHAGOGASTRODUODENOSCOPY (EGD);  Surgeon: Corbin Ade, MD;  Location: [facility];  Service: Endoscopy;  Laterality: N/A;  . Histosalpingogram      Family History  [name] Relation Age of Onset  .  Cancer Other   . Diabetes Other   . Arthritis Other   . Colon cancer Father        diagnosed in his late 12s.  . Esophageal cancer Neg Hx   . Stomach cancer Neg Hx    Social History   Tobacco Use  . Smoking status: Never Smoker  . Smokeless tobacco: Never Used  Vaping Use  . Vaping Use: Never used  Substance Use Topics  . Alcohol use: No  . Drug use: No    Allergies  Allergen Reactions  . Cyclobenzaprine Hcl Swelling    Current Meds  Medication Sig  . aspirin 81 MG tablet Take 81 mg by mouth daily.  Marland Kitchen losartan (COZAAR) 50 MG tablet Take 50 mg by mouth daily.  . metoprolol tartrate (LOPRESSOR) 25 MG tablet Take 1 tablet (25 mg total) by mouth 2 (two) times daily.  . pantoprazole (PROTONIX) 40 MG tablet Take 40 mg by mouth daily.  . predniSONE (DELTASONE) 5 MG tablet Take 3 tablets (15 mg total) by mouth daily with breakfast.  . triamcinolone ointment (KENALOG) 0.1 % Apply topically.    BP (!) 185/100   Pulse 90   Ht 5\' 3"  (1.6 m)   Wt 125 lb 6.4 oz (56.9 kg)   BMI 22.21 kg/m   Physical Exam  General appearance: Well-developed well-nourished no gross deformities  Cardiovascular normal pulse and perfusion normal color without edema  Neurologically o sensation loss or deficits or pathologic reflexes  Psychological:  Awake alert and oriented x3 mood and affect normal  Skin no lacerations or ulcerations no nodularity no palpable masses, no erythema or nodularity  Musculoskeletal:   Right and left shoulder both only demonstrate 80 degrees abduction and 90 degrees of flexion passively I can raise the right arm up to 140 degrees in the scapular plane with terminal pain noted crepitance noted weakness in the rotator cuff     MEDICAL DECISION MAKING  A.  Encounter Diagnoses  Name Primary?  . Acute pain of right shoulder Yes  . Chronic right shoulder pain     B. DATA ANALYSED:   IMAGING: Interpretation of images: Internal images show proximal migration of  the humerus no real severe arthritis mild joint space narrowing in the glenohumeral joint  Orders: None  Outside records reviewed: No   C. MANAGEMENT   Nonoperative  No orders of the defined types were placed in this encounter.     Fuller Canada, MD  10/30/2020 4:18 PM

## 2020-12-02 ENCOUNTER — Ambulatory Visit: Payer: Medicare Other | Admitting: Internal Medicine

## 2020-12-04 ENCOUNTER — Encounter (HOSPITAL_COMMUNITY): Payer: Self-pay | Admitting: Emergency Medicine

## 2020-12-04 ENCOUNTER — Emergency Department (HOSPITAL_COMMUNITY): Payer: Medicare Other

## 2020-12-04 ENCOUNTER — Emergency Department (HOSPITAL_COMMUNITY)
Admission: EM | Admit: 2020-12-04 | Discharge: 2020-12-04 | Disposition: A | Discharge status: Home / Self Care | Payer: Medicare Other | Attending: Emergency Medicine | Admitting: Emergency Medicine

## 2020-12-04 ENCOUNTER — Other Ambulatory Visit: Payer: Self-pay

## 2020-12-04 DIAGNOSIS — R630 Anorexia: Secondary | ICD-10-CM | POA: Diagnosis not present

## 2020-12-04 DIAGNOSIS — I10 Essential (primary) hypertension: Secondary | ICD-10-CM | POA: Insufficient documentation

## 2020-12-04 DIAGNOSIS — R531 Weakness: Secondary | ICD-10-CM | POA: Insufficient documentation

## 2020-12-04 DIAGNOSIS — R109 Unspecified abdominal pain: Secondary | ICD-10-CM | POA: Insufficient documentation

## 2020-12-04 DIAGNOSIS — Z79899 Other long term (current) drug therapy: Secondary | ICD-10-CM | POA: Insufficient documentation

## 2020-12-04 DIAGNOSIS — Z7982 Long term (current) use of aspirin: Secondary | ICD-10-CM | POA: Insufficient documentation

## 2020-12-04 LAB — CBC WITH DIFFERENTIAL/PLATELET
Abs Immature Granulocytes: 0.03 10*3/uL (ref 0.00–0.07)
Basophils Absolute: 0 10*3/uL (ref 0.0–0.1)
Basophils Relative: 0 %
Eosinophils Absolute: 0 10*3/uL (ref 0.0–0.5)
Eosinophils Relative: 0 %
HCT: 38.3 % (ref 36.0–46.0)
Hemoglobin: 12.5 g/dL (ref 12.0–15.0)
Immature Granulocytes: 0 %
Lymphocytes Relative: 9 %
Lymphs Abs: 0.7 10*3/uL (ref 0.7–4.0)
MCH: 31.8 pg (ref 26.0–34.0)
MCHC: 32.6 g/dL (ref 30.0–36.0)
MCV: 97.5 fL (ref 80.0–100.0)
Monocytes Absolute: 0.5 10*3/uL (ref 0.1–1.0)
Monocytes Relative: 5 %
Neutro Abs: 7.2 10*3/uL (ref 1.7–7.7)
Neutrophils Relative %: 86 %
Platelets: 282 10*3/uL (ref 150–400)
RBC: 3.93 MIL/uL (ref 3.87–5.11)
RDW: 14.5 % (ref 11.5–15.5)
WBC: 8.4 10*3/uL (ref 4.0–10.5)
nRBC: 0 % (ref 0.0–0.2)

## 2020-12-04 LAB — URINALYSIS, ROUTINE W REFLEX MICROSCOPIC
Bacteria, UA: NONE SEEN
Bilirubin Urine: NEGATIVE
Glucose, UA: NEGATIVE mg/dL
Hgb urine dipstick: NEGATIVE
Ketones, ur: 20 mg/dL — AB
Leukocytes,Ua: NEGATIVE
Nitrite: NEGATIVE
Protein, ur: 30 mg/dL — AB
Specific Gravity, Urine: 1.023 (ref 1.005–1.030)
pH: 6 (ref 5.0–8.0)

## 2020-12-04 LAB — COMPREHENSIVE METABOLIC PANEL
ALT: 27 U/L (ref 0–44)
AST: 67 U/L — ABNORMAL HIGH (ref 15–41)
Albumin: 3 g/dL — ABNORMAL LOW (ref 3.5–5.0)
Alkaline Phosphatase: 69 U/L (ref 38–126)
Anion gap: 8 (ref 5–15)
BUN: 15 mg/dL (ref 8–23)
CO2: 23 mmol/L (ref 22–32)
Calcium: 9.1 mg/dL (ref 8.9–10.3)
Chloride: 99 mmol/L (ref 98–111)
Creatinine, Ser: 0.76 mg/dL (ref 0.44–1.00)
GFR, Estimated: >60 mL/min (ref >60)
Glucose, Bld: 128 mg/dL — ABNORMAL HIGH (ref 70–99)
Potassium: 4.5 mmol/L (ref 3.5–5.1)
Sodium: 130 mmol/L — ABNORMAL LOW (ref 135–145)
Total Bilirubin: 0.6 mg/dL (ref 0.3–1.2)
Total Protein: 7.3 g/dL (ref 6.5–8.1)

## 2020-12-04 MED ORDER — SODIUM CHLORIDE 0.9 % IV BOLUS
1000.0000 mL | Freq: Once | INTRAVENOUS | Status: AC
  Administered 2020-12-04: 1000 mL via INTRAVENOUS

## 2020-12-04 MED ORDER — IOHEXOL 9 MG/ML PO SOLN
ORAL | Status: AC
  Filled 2020-12-04: qty 500

## 2020-12-04 NOTE — ED Notes (Signed)
Awaiting ride home to discharge

## 2020-12-04 NOTE — ED Provider Notes (Signed)
[facility] [place] EMERGENCY DEPARTMENT Provider Note   CSN: [id number] Arrival date & time: 12/04/20  9242     History Chief Complaint  Patient presents with   Weakness    Kathryn [name] is a 80 y.o. female.  Family reports pt has increased weakness, decreased appetite and decreased activity  The history is provided by the patient. No language interpreter was used.  Weakness Severity:  Moderate Onset quality:  Gradual Duration:  2 weeks Timing:  Constant Progression:  Worsening Chronicity:  New Context: not recent infection and not urinary tract infection   Relieved by:  Nothing Worsened by:  Nothing Ineffective treatments:  None tried Associated symptoms: no abdominal pain, no chest pain, no cough, no dysuria, no fever, no seizures, no shortness of breath and no vomiting   Risk factors: no anemia       Past Medical History:  Diagnosis Date   Acid reflux    Diabetes (HCC)    controlled with diet.    HTN (hypertension)     Patient Active Problem List   Diagnosis Date Noted   Loss of weight    Dysphagia    Early satiety    UTI (urinary tract infection) 10/23/2020   Elevated d-dimer 10/23/2020   Tachycardia 10/23/2020   Abnormal computed tomography of esophagus 10/23/2020   Mild protein-calorie malnutrition (HCC) 10/23/2020   Rhabdomyolysis 10/23/2020   HTN (hypertension) 10/23/2020   H/O rotator cuff syndrome 07/12/2012   Rotator cuff tendinitis 07/12/2012   SHOULDER PAIN 10/30/2007    Past Surgical History:  Procedure Laterality Date   ABDOMINAL HYSTERECTOMY     CHOLECYSTECTOMY     ESOPHAGEAL DILATION N/A 10/24/2020   Procedure: ESOPHAGEAL DILATION;  Surgeon: Corbin Ade, MD;  Location: [facility];  Service: Endoscopy;  Laterality: N/A;   ESOPHAGOGASTRODUODENOSCOPY N/A 10/24/2020   Procedure: ESOPHAGOGASTRODUODENOSCOPY (EGD);  Surgeon: Corbin Ade, MD;  Location: [facility];  Service: Endoscopy;  Laterality: N/A;   Histosalpingogram       OB  History   No obstetric history on file.     Family History  Problem Relation Age of Onset   Cancer Other    Diabetes Other    Arthritis Other    Colon cancer Father        diagnosed in his late 43s.   Esophageal cancer Neg Hx    Stomach cancer Neg Hx     Social History   Tobacco Use   Smoking status: Never   Smokeless tobacco: Never  Vaping Use   Vaping Use: Never used  Substance Use Topics   Alcohol use: No   Drug use: No    Home Medications Prior to Admission medications   Medication Sig Start Date End Date Taking? Authorizing Provider  aspirin 81 MG tablet Take 81 mg by mouth daily.    [provider]  losartan (COZAAR) 50 MG tablet Take 50 mg by mouth daily. 09/26/20   [provider]  metoprolol tartrate (LOPRESSOR) 25 MG tablet Take 1 tablet (25 mg total) by mouth 2 (two) times daily. 10/25/20   Erick Blinks, MD  pantoprazole (PROTONIX) 40 MG tablet Take 40 mg by mouth daily.    [provider]  predniSONE (DELTASONE) 5 MG tablet Take 3 tablets (15 mg total) by mouth daily with breakfast. 10/26/20   Erick Blinks, MD  triamcinolone ointment (KENALOG) 0.1 % Apply topically. 09/26/20   [provider]    Allergies    Cyclobenzaprine hcl  Review of Systems   Review of Systems  Constitutional:  Negative for chills and fever.  HENT:  Negative for ear pain and sore throat.   Eyes:  Negative for pain and visual disturbance.  Respiratory:  Negative for cough and shortness of breath.   Cardiovascular:  Negative for chest pain.  Gastrointestinal:  Negative for abdominal pain and vomiting.  Genitourinary:  Negative for dysuria and hematuria.  Skin:  Negative for color change and rash.  Neurological:  Positive for weakness. Negative for seizures and syncope.  All other systems reviewed and are negative.  Physical Exam Updated Vital Signs BP (!) 110/58   Pulse 94   Temp 98.2 F (36.8 C) (Oral)   Resp 15   Ht 5\' 3"  (1.6 m)   Wt  56.7 kg   SpO2 99%   BMI 22.14 kg/m   Physical Exam Vitals and nursing note reviewed.  Constitutional:      Appearance: She is well-developed.  HENT:     Head: Normocephalic.     Mouth/Throat:     Mouth: Mucous membranes are moist.  Eyes:     Pupils: Pupils are equal, round, and reactive to light.  Cardiovascular:     Rate and Rhythm: Normal rate and regular rhythm.  Pulmonary:     Effort: Pulmonary effort is normal.  Abdominal:     General: There is no distension.  Musculoskeletal:        General: Normal range of motion.     Cervical back: Normal range of motion.  Skin:    General: Skin is warm.  Neurological:     General: No focal deficit present.     Mental Status: She is alert and oriented to person, place, and time.  Psychiatric:        Mood and Affect: Mood normal.    ED Results / Procedures / Treatments   Labs (all labs ordered are listed, but only abnormal results are displayed) Labs Reviewed  COMPREHENSIVE METABOLIC PANEL - Abnormal; Notable for the following components:      Result Value   Sodium 130 (*)    Glucose, Bld 128 (*)    Albumin 3.0 (*)    AST 67 (*)    All other components within normal limits  URINALYSIS, ROUTINE W REFLEX MICROSCOPIC - Abnormal; Notable for the following components:   APPearance HAZY (*)    Ketones, ur 20 (*)    Protein, ur 30 (*)    All other components within normal limits  CBC WITH DIFFERENTIAL/PLATELET    EKG None  Radiology CT ABDOMEN PELVIS WO CONTRAST  Result Date: 12/04/2020 CLINICAL DATA:  Acute abdominal pain, nonlocalized.  Nausea. EXAM: CT ABDOMEN AND PELVIS WITHOUT CONTRAST TECHNIQUE: Multidetector CT imaging of the abdomen and pelvis was performed following the standard protocol without IV contrast. COMPARISON:  None available FINDINGS: Lower chest: Enteric contrast is seen within the nondilated lower esophagus. Coronary calcification. Hepatobiliary: No focal liver abnormality.Cholecystectomy. Unremarkable  biliary tree. Pancreas: Unremarkable. Spleen: Unremarkable. Adrenals/Urinary Tract: Negative adrenals. No hydronephrosis or stone. Unremarkable bladder. Stomach/Bowel:  No obstruction. No appendicitis. Vascular/Lymphatic: No acute vascular abnormality. Atheromatous calcifications. No mass or adenopathy. Reproductive:Ovoid fluid-filled structure in the expected location of the uterus with small calcified densities peripherally, suspect endometrial cavity thickening from fluid or soft tissue, surrounded by atrophic myometrium, although there is chart history of hysterectomy. Other: No ascites or pneumoperitoneum. Musculoskeletal: No acute abnormalities. Heterogeneous bones diffusely which is likely from osteopenia. Diffuse spinal degeneration with scoliosis and  L4-5 anterolisthesis. Marked endplate degeneration at L1-2. IMPRESSION: 1. Negative for bowel obstruction or visible inflammation. 2. Elongated centrally centrally low-density structure in the pelvis, first thought to be atrophic uterus with abnormal endometrial thickening, but there is history of hysterectomy and this may reflect a fluid collection instead. No associated inflammatory changes to correlate with the history. Please correlate with operative history. Electronically Signed   By: Marnee Spring M.D.   On: 12/04/2020 12:15   DG Chest Port 1 View  Result Date: 12/04/2020 CLINICAL DATA:  Cough EXAM: PORTABLE CHEST 1 VIEW COMPARISON:  10/23/2020 FINDINGS: The heart size and mediastinal contours are within normal limits. No focal airspace disease. No pleural effusion or pneumothorax. No acute osseous abnormality. IMPRESSION: No evidence of acute cardiopulmonary disease. Electronically Signed   By: Caprice Renshaw   On: 12/04/2020 10:07    Procedures Procedures   Medications Ordered in ED Medications  iohexol (OMNIPAQUE) 9 MG/ML oral solution (has no administration in time range)  sodium chloride 0.9 % bolus 1,000 mL (0 mLs Intravenous Stopped  12/04/20 1141)    ED Course  I have reviewed the triage vital signs and the nursing notes.  Pertinent labs & imaging results that were available during my care of the patient were reviewed by me and considered in my medical decision making (see chart for details).    MDM Rules/Calculators/A&P MDM:  face to face done.  Pt should benefit from Pt and aide in home Pt encouraged to increase calorie intake Pt advised to follow up with her primary care for evaluation   Final Clinical Impression(s) / ED Diagnoses Final diagnoses:  Weakness    Rx / DC Orders ED Discharge Orders     None     An After Visit Summary was printed and given to the patient.    Elson Areas, PA-C 12/04/20 1759    Rozelle Logan, DO 12/06/20 [phone]

## 2020-12-04 NOTE — Discharge Instructions (Addendum)
Increase calorie intake 

## 2020-12-04 NOTE — ED Triage Notes (Signed)
Pt c/o weakness for the last month and has not been eating as much as normal.

## 2020-12-25 ENCOUNTER — Encounter (HOSPITAL_COMMUNITY): Payer: Self-pay | Admitting: *Deleted

## 2020-12-25 ENCOUNTER — Inpatient Hospital Stay (HOSPITAL_COMMUNITY)
Admission: EM | Admit: 2020-12-25 | Discharge: 2020-12-28 | DRG: 391 | Disposition: A | Discharge status: Home / Self Care | Payer: Medicare Other | Attending: Internal Medicine | Admitting: Internal Medicine

## 2020-12-25 ENCOUNTER — Emergency Department (HOSPITAL_COMMUNITY): Payer: Medicare Other

## 2020-12-25 DIAGNOSIS — R768 Other specified abnormal immunological findings in serum: Secondary | ICD-10-CM | POA: Diagnosis present

## 2020-12-25 DIAGNOSIS — E860 Dehydration: Secondary | ICD-10-CM | POA: Diagnosis present

## 2020-12-25 DIAGNOSIS — R1319 Other dysphagia: Secondary | ICD-10-CM | POA: Diagnosis not present

## 2020-12-25 DIAGNOSIS — Z7982 Long term (current) use of aspirin: Secondary | ICD-10-CM | POA: Diagnosis not present

## 2020-12-25 DIAGNOSIS — U071 COVID-19: Secondary | ICD-10-CM | POA: Diagnosis present

## 2020-12-25 DIAGNOSIS — K529 Noninfective gastroenteritis and colitis, unspecified: Secondary | ICD-10-CM | POA: Diagnosis present

## 2020-12-25 DIAGNOSIS — Z833 Family history of diabetes mellitus: Secondary | ICD-10-CM | POA: Diagnosis not present

## 2020-12-25 DIAGNOSIS — E119 Type 2 diabetes mellitus without complications: Secondary | ICD-10-CM | POA: Diagnosis present

## 2020-12-25 DIAGNOSIS — R1314 Dysphagia, pharyngoesophageal phase: Principal | ICD-10-CM | POA: Diagnosis present

## 2020-12-25 DIAGNOSIS — Z888 Allergy status to other drugs, medicaments and biological substances status: Secondary | ICD-10-CM

## 2020-12-25 DIAGNOSIS — K220 Achalasia of cardia: Secondary | ICD-10-CM | POA: Diagnosis present

## 2020-12-25 DIAGNOSIS — E1159 Type 2 diabetes mellitus with other circulatory complications: Secondary | ICD-10-CM | POA: Diagnosis present

## 2020-12-25 DIAGNOSIS — Z79899 Other long term (current) drug therapy: Secondary | ICD-10-CM

## 2020-12-25 DIAGNOSIS — K449 Diaphragmatic hernia without obstruction or gangrene: Secondary | ICD-10-CM | POA: Diagnosis present

## 2020-12-25 DIAGNOSIS — R1310 Dysphagia, unspecified: Secondary | ICD-10-CM | POA: Diagnosis not present

## 2020-12-25 DIAGNOSIS — I10 Essential (primary) hypertension: Secondary | ICD-10-CM | POA: Diagnosis present

## 2020-12-25 DIAGNOSIS — K219 Gastro-esophageal reflux disease without esophagitis: Secondary | ICD-10-CM | POA: Diagnosis present

## 2020-12-25 DIAGNOSIS — E876 Hypokalemia: Secondary | ICD-10-CM | POA: Diagnosis present

## 2020-12-25 DIAGNOSIS — I152 Hypertension secondary to endocrine disorders: Secondary | ICD-10-CM | POA: Diagnosis present

## 2020-12-25 LAB — CBC WITH DIFFERENTIAL/PLATELET
Abs Immature Granulocytes: 0.05 10*3/uL (ref 0.00–0.07)
Basophils Absolute: 0 10*3/uL (ref 0.0–0.1)
Basophils Relative: 0 %
Eosinophils Absolute: 0 10*3/uL (ref 0.0–0.5)
Eosinophils Relative: 0 %
HCT: 35.9 % — ABNORMAL LOW (ref 36.0–46.0)
Hemoglobin: 11.7 g/dL — ABNORMAL LOW (ref 12.0–15.0)
Immature Granulocytes: 1 %
Lymphocytes Relative: 6 %
Lymphs Abs: 0.6 10*3/uL — ABNORMAL LOW (ref 0.7–4.0)
MCH: 31.9 pg (ref 26.0–34.0)
MCHC: 32.6 g/dL (ref 30.0–36.0)
MCV: 97.8 fL (ref 80.0–100.0)
Monocytes Absolute: 0.5 10*3/uL (ref 0.1–1.0)
Monocytes Relative: 5 %
Neutro Abs: 9.3 10*3/uL — ABNORMAL HIGH (ref 1.7–7.7)
Neutrophils Relative %: 88 %
Platelets: 181 10*3/uL (ref 150–400)
RBC: 3.67 MIL/uL — ABNORMAL LOW (ref 3.87–5.11)
RDW: 14.4 % (ref 11.5–15.5)
WBC: 10.5 10*3/uL (ref 4.0–10.5)
nRBC: 0 % (ref 0.0–0.2)

## 2020-12-25 LAB — COMPREHENSIVE METABOLIC PANEL
ALT: 18 U/L (ref 0–44)
AST: 40 U/L (ref 15–41)
Albumin: 3.2 g/dL — ABNORMAL LOW (ref 3.5–5.0)
Alkaline Phosphatase: 62 U/L (ref 38–126)
Anion gap: 11 (ref 5–15)
BUN: 15 mg/dL (ref 8–23)
CO2: 26 mmol/L (ref 22–32)
Calcium: 9 mg/dL (ref 8.9–10.3)
Chloride: 97 mmol/L — ABNORMAL LOW (ref 98–111)
Creatinine, Ser: 0.58 mg/dL (ref 0.44–1.00)
GFR, Estimated: >60 mL/min (ref >60)
Glucose, Bld: 103 mg/dL — ABNORMAL HIGH (ref 70–99)
Potassium: 3.6 mmol/L (ref 3.5–5.1)
Sodium: 134 mmol/L — ABNORMAL LOW (ref 135–145)
Total Bilirubin: 1.1 mg/dL (ref 0.3–1.2)
Total Protein: 7.4 g/dL (ref 6.5–8.1)

## 2020-12-25 LAB — CBG MONITORING, ED: Glucose-Capillary: 133 mg/dL — ABNORMAL HIGH (ref 70–99)

## 2020-12-25 LAB — LIPASE, BLOOD: Lipase: 27 U/L (ref 11–51)

## 2020-12-25 LAB — RESP PANEL BY RT-PCR (FLU A&B, COVID) ARPGX2
Influenza A by PCR: NEGATIVE
Influenza B by PCR: NEGATIVE
SARS Coronavirus 2 by RT PCR: POSITIVE — AB

## 2020-12-25 LAB — LACTATE DEHYDROGENASE: LDH: 228 U/L — ABNORMAL HIGH (ref 98–192)

## 2020-12-25 MED ORDER — NIRMATRELVIR/RITONAVIR (PAXLOVID) TABLET (RENAL DOSING)
2.0000 | ORAL_TABLET | Freq: Two times a day (BID) | ORAL | Status: DC
  Administered 2020-12-26 – 2020-12-28 (×4): 2 via ORAL
  Filled 2020-12-25: qty 20

## 2020-12-25 MED ORDER — KCL IN DEXTROSE-NACL 20-5-0.9 MEQ/L-%-% IV SOLN
INTRAVENOUS | Status: AC
  Filled 2020-12-25 (×2): qty 1000

## 2020-12-25 MED ORDER — GLUCAGON HCL RDNA (DIAGNOSTIC) 1 MG IJ SOLR
1.0000 mg | Freq: Once | INTRAMUSCULAR | Status: AC
  Administered 2020-12-25: 1 mg via INTRAVENOUS
  Filled 2020-12-25: qty 1

## 2020-12-25 MED ORDER — SODIUM CHLORIDE 0.9 % IV BOLUS
1000.0000 mL | Freq: Once | INTRAVENOUS | Status: AC
  Administered 2020-12-25: 1000 mL via INTRAVENOUS

## 2020-12-25 MED ORDER — SODIUM CHLORIDE 0.9 % IV SOLN
2.0000 g | INTRAVENOUS | Status: DC
  Administered 2020-12-25 – 2020-12-26 (×2): 2 g via INTRAVENOUS
  Filled 2020-12-25 (×2): qty 20

## 2020-12-25 MED ORDER — IOHEXOL 300 MG/ML  SOLN
100.0000 mL | Freq: Once | INTRAMUSCULAR | Status: AC | PRN
  Administered 2020-12-25: 100 mL via INTRAVENOUS

## 2020-12-25 MED ORDER — PANTOPRAZOLE SODIUM 40 MG IV SOLR
40.0000 mg | Freq: Once | INTRAVENOUS | Status: AC
  Administered 2020-12-25: 40 mg via INTRAVENOUS
  Filled 2020-12-25: qty 40

## 2020-12-25 MED ORDER — MORPHINE SULFATE (PF) 2 MG/ML IV SOLN
2.0000 mg | INTRAVENOUS | Status: DC | PRN
Start: 2020-12-25 — End: 2020-12-28

## 2020-12-25 MED ORDER — METRONIDAZOLE 500 MG/100ML IV SOLN
500.0000 mg | Freq: Three times a day (TID) | INTRAVENOUS | Status: DC
  Administered 2020-12-25 – 2020-12-27 (×5): 500 mg via INTRAVENOUS
  Filled 2020-12-25 (×5): qty 100

## 2020-12-25 NOTE — ED Notes (Signed)
Date and time results received: 12/25/20 2211  Test: COVID Critical Value: positive  Name of Provider Notified: Mariea Clonts, MD  Orders Received? Or Actions Taken?: acknowledged

## 2020-12-25 NOTE — ED Triage Notes (Signed)
Weakness and unable to eat

## 2020-12-25 NOTE — ED Notes (Signed)
AC called for IV fluids.

## 2020-12-25 NOTE — H&P (Signed)
History and Physical    Kathryn [name] JKD:[id number] DOB: 01/18/1941 DOA: 12/25/2020  PCP: Leeanne Rio, MD   Patient coming from: Home  I have personally briefly reviewed patient's old medical records in [place]  Chief Complaint: difficulty swallowing, abdominal pain  HPI: Kathryn [name] is a 80 y.o. female with medical history significant for diabetes mellitus, hypertension. Patient presented to ED with complaints of difficulty swallowing ongoing for the past month.  She has having difficulty with solids and liquids.  No difficulty breathing, she reports occasional cough. Abdominal pain of 1 week duration-mid upper abdomen.  She reports vomiting, but she thinks this is from her difficulty swallowing.  No loose stools.  No fevers no chills.  Recent hospitalization 4/28-4/30-patient presented with proximal muscle weakness/pain, concern for polymyalgia rheumatica, ANA panel was positive, and positive anti-Ro antibody.  ESR was elevated at 44 and CK was 1206.  Patient was to follow-up with rheumatology as outpatient.  Improvement in patient's symptoms with steroids.  ED Course: Temperature 98.8.  Heart rate 90s to 123.  Blood pressure systolic 099I to 338S.  WBC 10.5.  Lipase 27.  Abdominal CT suggest infectious or inflammatory colitis.  IV Protonix given.  1 G glucagon given. EDP talked to Dr. Gala Romney, patient to be seen in the morning.  Review of Systems: As per HPI all other systems reviewed and negative.  Past Medical History:  Diagnosis Date   Acid reflux    Diabetes ([place])    controlled with diet.    HTN (hypertension)     Past Surgical History:  Procedure Laterality Date   ABDOMINAL HYSTERECTOMY     CHOLECYSTECTOMY     ESOPHAGEAL DILATION N/A 10/24/2020   Procedure: ESOPHAGEAL DILATION;  Surgeon: Daneil Dolin, MD;  [facility];  Service: Endoscopy;  Laterality: N/A;   ESOPHAGOGASTRODUODENOSCOPY N/A 10/24/2020   Procedure: ESOPHAGOGASTRODUODENOSCOPY  (EGD);  Surgeon: Daneil Dolin, MD;  Location: [facility];  Service: Endoscopy;  Laterality: N/A;   Histosalpingogram       reports that she has never smoked. She has never used smokeless tobacco. She reports that she does not drink alcohol and does not use drugs.  Allergies  Allergen Reactions   Cyclobenzaprine Hcl Swelling    Family History  Problem Relation Age of Onset   Cancer Other    Diabetes Other    Arthritis Other    Colon cancer Father        diagnosed in his late 21s.   Esophageal cancer Neg Hx    Stomach cancer Neg Hx     Prior to Admission medications   Medication Sig Start Date End Date Taking? Authorizing Provider  aspirin 81 MG tablet Take 81 mg by mouth daily.   Yes [provider]  losartan (COZAAR) 50 MG tablet Take 50 mg by mouth daily. 09/26/20  Yes [provider]  pantoprazole (PROTONIX) 40 MG tablet Take 40 mg by mouth daily.   Yes [provider]  triamcinolone ointment (KENALOG) 0.1 % Apply topically. 09/26/20  Yes [provider]  metoprolol tartrate (LOPRESSOR) 25 MG tablet Take 1 tablet (25 mg total) by mouth 2 (two) times daily. Patient not taking: No sig reported 10/25/20   [name], MD  predniSONE (DELTASONE) 5 MG tablet Take 3 tablets (15 mg total) by mouth daily with breakfast. Patient not taking: No sig reported 10/26/20   [name], MD    Physical Exam: Vitals:   12/25/20 2100  12/25/20 2115 12/25/20 2130 12/25/20 2200  BP: 135/67  134/68 134/69  Pulse: 95 93 96 92  Resp: (!) _0 Temp:      TempSrc:      SpO2: 99% 99% 99% 98%    Constitutional: NAD, calm, comfortable Vitals:   12/25/20 2100 12/25/20 2115 12/25/20 2130 12/25/20 2200  BP: 135/67  134/68 134/69  Pulse: 95 93 96 92  Resp: (!) _1 Temp:      TempSrc:      SpO2: 99% 99% 99% 98%   Eyes: PERRL, lids and conjunctivae normal ENMT: Mucous membranes are moist. .  White coating on tongue, but patient reports  this goes away with brushing Neck: normal, supple, no masses, no thyromegaly Respiratory: clear to auscultation bilaterally, no wheezing, no crackles. Normal respiratory effort. No accessory muscle use.  Cardiovascular: Regular rate and rhythm, no murmurs / rubs / gallops. No extremity edema. 2+ pedal pulses.  Abdomen: no tenderness, no masses palpated. No hepatosplenomegaly. Bowel sounds positive.  Musculoskeletal: no clubbing / cyanosis. No joint deformity upper and lower extremities. Good ROM, no contractures. Normal muscle tone.  Skin: no rashes, lesions, ulcers. No induration Neurologic: No apparent cranial nerve abnormality, speech clear and fluent.  Right lower extremity weakness -barely able to raise extremity against gravity, since recent hospitalization 2 months ago, unchanged. 5/5 strenght all other extremities Psychiatric: Normal judgment and insight. Alert and oriented x 3. Normal mood.   Labs on Admission: I have personally reviewed following labs and imaging studies  CBC: Recent Labs  Lab 12/25/20 1521  WBC 10.5  NEUTROABS 9.3*  HGB 11.7*  HCT 35.9*  MCV 97.8  PLT 588   Basic Metabolic Panel: Recent Labs  Lab 12/25/20 1521  NA 134*  K 3.6  CL 97*  CO2 26  GLUCOSE 103*  BUN 15  CREATININE 0.58  CALCIUM 9.0   Liver Function Tests: Recent Labs  Lab 12/25/20 1521  AST 40  ALT 18  ALKPHOS 62  BILITOT 1.1  PROT 7.4  ALBUMIN 3.2*   Recent Labs  Lab 12/25/20 1521  LIPASE 27   CBG: Recent Labs  Lab 12/25/20 1358  GLUCAP 133*    Radiological Exams on Admission: CT ABDOMEN PELVIS W CONTRAST  Result Date: 12/25/2020 CLINICAL DATA:  Weakness, possible infection. EXAM: CT ABDOMEN AND PELVIS WITH CONTRAST TECHNIQUE: Multidetector CT imaging of the abdomen and pelvis was performed using the standard protocol following bolus administration of intravenous contrast. CONTRAST:  140m OMNIPAQUE IOHEXOL 300 MG/ML  SOLN COMPARISON:  December 11, 2020. FINDINGS: Lower  chest: No acute abnormality. Hepatobiliary: No focal liver abnormality is seen. Status post cholecystectomy. No biliary dilatation. Pancreas: Unremarkable. No pancreatic ductal dilatation or surrounding inflammatory changes. Spleen: Normal in size without focal abnormality. Adrenals/Urinary Tract: Adrenal glands appear normal. Left renal cyst is noted. No hydronephrosis or renal obstruction is noted. Urinary bladder is unremarkable. Stomach/Bowel: The stomach and appendix appear normal. There is no evidence of bowel obstruction. Mild diffuse wall thickening is seen involving the cecum concerning for infectious or inflammatory colitis. Vascular/Lymphatic: Aortic atherosclerosis. No enlarged abdominal or pelvic lymph nodes. Reproductive: Status post hysterectomy. No adnexal masses. Other: No abdominal wall hernia or abnormality. No abdominopelvic ascites. Musculoskeletal: No acute or significant osseous findings. IMPRESSION: Mild diffuse wall thickening is seen involving the cecum concerning for infectious or inflammatory colitis. Aortic Atherosclerosis (ICD10-I70.0). Electronically Signed   By: JMarijo ConceptionM.D.   On: 12/25/2020 17:38  DG Chest Port 1 View  Result Date: 12/25/2020 CLINICAL DATA:  Shortness of breath and cough. EXAM: PORTABLE CHEST 1 VIEW COMPARISON:  December 04, 2020 FINDINGS: The lungs are hyperinflated. There is no evidence of acute infiltrate, pleural effusion or pneumothorax. The heart size and mediastinal contours are within normal limits. There is marked severity calcification of the aortic arch. Multilevel degenerative changes seen throughout the thoracic spine. IMPRESSION: No active cardiopulmonary disease. Electronically Signed   By: Virgina Norfolk M.D.   On: 12/25/2020 16:25    EKG:  None.   Assessment/Plan Principal Problem:   Colitis Active Problems:   HTN (hypertension)   Dysphagia   COVID-19 virus infection   Colitis- Abd pain, vomiting . CT suggest infectious or  inflammatory colitis.  R/o for sepsis.  -  1 L bolus given, cont D5 N/s 75cc/hr x 1 day -IV ceftriaxone and metronidazole - N.p.o. -IV morphine as needed -IV Protonix 40 daily  Dysphagia-to solids and liquids.  Recent endoscopy 09/2020 - for same showed normal esophagus that was dilated.  Patient reports her symptoms this time are worse than prior.  White coating on tongue, but patient reports this goes away with brushing. ? ? Functional dysphagia considering unremarkable recent EGD, versus rheumatologic disorder considering recent hospitalization symptoms/ positive ANA and anti-Ro- ?sjogrens or other autoimm disorders. - N.p.o. -IV hydration - EDP talked to Dr. Gala Romney, patient will be seen in the morning.  Incidental COVID-positive- GI symptoms have been ongoing for about a week.  She reports a cough, but thought to be related to her dysphagia.  No dyspnea.  Chest x-ray clear.  She is on room air. Vaccinated for COVID X 3. -Pharmacy consult if patient would be candidate for Paxlovid.  Controlled Diabetes mellitus -random glucose 103. Diet Controlled. -Daily CBGs -Dextrose containing fluids while n.p.o.  HTN- Stable - Hold losartan while n.p.o.  Recent hospitalization for proximal muscle weakness-improved with course of steroids. -Patient was to follow-up with rheumatology.  DVT prophylaxis: SCDS Code Status: Full code Family Communication: Nohe at bedside Disposition Plan: ~  2 days Consults called: GI Admission status: Inpt, tele I certify that at the point of admission it is my clinical judgment that the patient will require inpatient hospital care spanning beyond 2 midnights from the point of admission due to high intensity of service, high risk for further deterioration and high frequency of surveillance required.    Bethena Roys MD [facility]  12/25/2020, 10:58 PM

## 2020-12-25 NOTE — ED Provider Notes (Signed)
[facility] EMERGENCY DEPARTMENT Provider Note   CSN: [id number] Arrival date & time: 12/25/20  1349     History Chief Complaint  Patient presents with   Weakness    Kathryn [name] is a 80 y.o. female.  Patient complains of weakness.  Patient has not been eating or drinking for couple weeks.  She says its difficult for her to swallow.  She has had her esophagus dilated once before.  She is able to swallow liquids but not solids.  Patient also complains of abdominal pain  The history is provided by the patient. No language interpreter was used.  Weakness Severity:  Moderate Onset quality:  Sudden Duration: 3 days. Timing:  Constant Progression:  Worsening Chronicity:  Recurrent Context: not alcohol use   Relieved by:  Nothing Worsened by:  Nothing Associated symptoms: no abdominal pain, no chest pain, no cough, no diarrhea, no frequency, no headaches and no seizures       Past Medical History:  Diagnosis Date   Acid reflux    Diabetes ([facility])    controlled with diet.    HTN (hypertension)     Patient Active Problem List   Diagnosis Date Noted   Colitis 12/25/2020   Loss of weight    Dysphagia    Early satiety    UTI (urinary tract infection) 10/23/2020   Elevated d-dimer 10/23/2020   Tachycardia 10/23/2020   Abnormal computed tomography of esophagus 10/23/2020   Mild protein-calorie malnutrition ([facility]) 10/23/2020   Rhabdomyolysis 10/23/2020   HTN (hypertension) 10/23/2020   H/O rotator cuff syndrome 07/12/2012   Rotator cuff tendinitis 07/12/2012   SHOULDER PAIN 10/30/2007    Past Surgical History:  Procedure Laterality Date   ABDOMINAL HYSTERECTOMY     CHOLECYSTECTOMY     ESOPHAGEAL DILATION N/A 10/24/2020   Procedure: ESOPHAGEAL DILATION;  Surgeon: Corbin Ade, MD;  [facility];  Service: Endoscopy;  Laterality: N/A;   ESOPHAGOGASTRODUODENOSCOPY N/A 10/24/2020   Procedure: ESOPHAGOGASTRODUODENOSCOPY (EGD);  Surgeon: Corbin Ade, MD;   [facility];  Service: Endoscopy;  Laterality: N/A;   Histosalpingogram       OB History   No obstetric history on file.     Family History  Problem Relation Age of Onset   Cancer Other    Diabetes Other    Arthritis Other    Colon cancer Father        diagnosed in his late 45s.   Esophageal cancer Neg Hx    Stomach cancer Neg Hx     Social History   Tobacco Use   Smoking status: Never   Smokeless tobacco: Never  Vaping Use   Vaping Use: Never used  Substance Use Topics   Alcohol use: No   Drug use: No    Home Medications Prior to Admission medications   Medication Sig Start Date End Date Taking? Authorizing Provider  aspirin 81 MG tablet Take 81 mg by mouth daily.   Yes [provider]  losartan (COZAAR) 50 MG tablet Take 50 mg by mouth daily. 09/26/20  Yes [provider]  pantoprazole (PROTONIX) 40 MG tablet Take 40 mg by mouth daily.   Yes [provider]  triamcinolone ointment (KENALOG) 0.1 % Apply topically. 09/26/20  Yes [provider]  metoprolol tartrate (LOPRESSOR) 25 MG tablet Take 1 tablet (25 mg total) by mouth 2 (two) times daily. Patient not taking: No sig reported 10/25/20   Erick Blinks, MD  predniSONE (DELTASONE) 5 MG  tablet Take 3 tablets (15 mg total) by mouth daily with breakfast. Patient not taking: No sig reported 10/26/20   Erick Blinks, MD    Allergies    Cyclobenzaprine hcl  Review of Systems   Review of Systems  Constitutional:  Negative for appetite change and fatigue.  HENT:  Negative for congestion, ear discharge and sinus pressure.   Eyes:  Negative for discharge.  Respiratory:  Negative for cough.   Cardiovascular:  Negative for chest pain.  Gastrointestinal:  Negative for abdominal pain and diarrhea.       Difficulty swallowing  Genitourinary:  Negative for frequency and hematuria.  Musculoskeletal:  Negative for back pain.  Skin:  Negative for rash.  Neurological:  Positive  for weakness. Negative for seizures and headaches.  Psychiatric/Behavioral:  Negative for hallucinations.    Physical Exam Updated Vital Signs BP 130/75   Pulse (!) 123   Temp 98.8 F (37.1 C) (Oral)   Resp 18   SpO2 99%   Physical Exam Vitals and nursing note reviewed.  Constitutional:      Appearance: She is well-developed.  HENT:     Head: Normocephalic.     Mouth/Throat:     Comments: Dry mucous membrane Eyes:     General: No scleral icterus.    Conjunctiva/sclera: Conjunctivae normal.  Neck:     Thyroid: No thyromegaly.  Cardiovascular:     Rate and Rhythm: Normal rate and regular rhythm.     Heart sounds: No murmur heard.   No friction rub. No gallop.  Pulmonary:     Breath sounds: No stridor. No wheezing or rales.  Chest:     Chest wall: No tenderness.  Abdominal:     General: There is no distension.     Tenderness: There is no abdominal tenderness. There is no rebound.  Musculoskeletal:        General: Normal range of motion.     Cervical back: Neck supple.  Lymphadenopathy:     Cervical: No cervical adenopathy.  Skin:    Findings: No erythema or rash.  Neurological:     Mental Status: She is alert and oriented to person, place, and time.     Motor: No abnormal muscle tone.     Coordination: Coordination normal.  Psychiatric:        Behavior: Behavior normal.    ED Results / Procedures / Treatments   Labs (all labs ordered are listed, but only abnormal results are displayed) Labs Reviewed  CBC WITH DIFFERENTIAL/PLATELET - Abnormal; Notable for the following components:      Result Value   RBC 3.67 (*)    Hemoglobin 11.7 (*)    HCT 35.9 (*)    Neutro Abs 9.3 (*)    Lymphs Abs 0.6 (*)    All other components within normal limits  COMPREHENSIVE METABOLIC PANEL - Abnormal; Notable for the following components:   Sodium 134 (*)    Chloride 97 (*)    Glucose, Bld 103 (*)    Albumin 3.2 (*)    All other components within normal limits  CBG  MONITORING, ED - Abnormal; Notable for the following components:   Glucose-Capillary 133 (*)    All other components within normal limits  LIPASE, BLOOD    EKG None  Radiology CT ABDOMEN PELVIS W CONTRAST  Result Date: 12/25/2020 CLINICAL DATA:  Weakness, possible infection. EXAM: CT ABDOMEN AND PELVIS WITH CONTRAST TECHNIQUE: Multidetector CT imaging of the abdomen and pelvis was performed using the  standard protocol following bolus administration of intravenous contrast. CONTRAST:  OMNIPAQUE IOHEXOL 300 MG/ML  SOLN COMPARISON:  December 11, 2020. FINDINGS: Lower chest: No acute abnormality. Hepatobiliary: No focal liver abnormality is seen. Status post cholecystectomy. No biliary dilatation. Pancreas: Unremarkable. No pancreatic ductal dilatation or surrounding inflammatory changes. Spleen: Normal in size without focal abnormality. Adrenals/Urinary Tract: Adrenal glands appear normal. Left renal cyst is noted. No hydronephrosis or renal obstruction is noted. Urinary bladder is unremarkable. Stomach/Bowel: The stomach and appendix appear normal. There is no evidence of bowel obstruction. Mild diffuse wall thickening is seen involving the cecum concerning for infectious or inflammatory colitis. Vascular/Lymphatic: Aortic atherosclerosis. No enlarged abdominal or pelvic lymph nodes. Reproductive: Status post hysterectomy. No adnexal masses. Other: No abdominal wall hernia or abnormality. No abdominopelvic ascites. Musculoskeletal: No acute or significant osseous findings. IMPRESSION: Mild diffuse wall thickening is seen involving the cecum concerning for infectious or inflammatory colitis. Aortic Atherosclerosis (ICD10-I70.0). Electronically Signed   By: Lupita Raider M.D.   On: 12/25/2020 17:38   DG Chest Port 1 View  Result Date: 12/25/2020 CLINICAL DATA:  Shortness of breath and cough. EXAM: PORTABLE CHEST 1 VIEW COMPARISON:  December 04, 2020 FINDINGS: The lungs are hyperinflated. There is no  evidence of acute infiltrate, pleural effusion or pneumothorax. The heart size and mediastinal contours are within normal limits. There is marked severity calcification of the aortic arch. Multilevel degenerative changes seen throughout the thoracic spine. IMPRESSION: No active cardiopulmonary disease. Electronically Signed   By: Aram Candela M.D.   On: 12/25/2020 16:25    Procedures Procedures   Medications Ordered in ED Medications  sodium chloride 0.9 % bolus 1,000 mL (0 mLs Intravenous Stopped 12/25/20 1823)  pantoprazole (PROTONIX) injection 40 mg (40 mg Intravenous Given 12/25/20 1544)  iohexol (OMNIPAQUE) 300 MG/ML solution 100 mL (100 mLs Intravenous Contrast Given 12/25/20 1650)  glucagon (human recombinant) (GLUCAGEN) injection 1 mg (1 mg Intravenous Given 12/25/20 1821)    ED Course  I have reviewed the triage vital signs and the nursing notes.  Pertinent labs & imaging results that were available during my care of the patient were reviewed by me and considered in my medical decision making (see chart for details). CT scan shows colitis.  Patient most likely has a partial obstruction of her esophagus.  I spoke with Dr. Jena Gauss and he states that we should admit the patient to the hospitalist tonight and GI will consult tomorrow possibly endoscopy.  Continue hydrating her for the dehydration   MDM Rules/Calculators/A&P                          Dehydration abdominal pain and possible obstruction of esophagus.  Patient will be admitted to medicine with GI consult tomorrow Final Clinical Impression(s) / ED Diagnoses Final diagnoses:  Dehydration    Rx / DC Orders ED Discharge Orders     None        Bethann Berkshire, MD 12/26/20 1114

## 2020-12-26 ENCOUNTER — Other Ambulatory Visit: Payer: Self-pay

## 2020-12-26 DIAGNOSIS — R1319 Other dysphagia: Secondary | ICD-10-CM

## 2020-12-26 DIAGNOSIS — R1310 Dysphagia, unspecified: Secondary | ICD-10-CM

## 2020-12-26 DIAGNOSIS — K529 Noninfective gastroenteritis and colitis, unspecified: Secondary | ICD-10-CM

## 2020-12-26 DIAGNOSIS — U071 COVID-19: Secondary | ICD-10-CM

## 2020-12-26 DIAGNOSIS — I10 Essential (primary) hypertension: Secondary | ICD-10-CM

## 2020-12-26 LAB — MAGNESIUM: Magnesium: 1.8 mg/dL (ref 1.7–2.4)

## 2020-12-26 LAB — COMPREHENSIVE METABOLIC PANEL
ALT: 14 U/L (ref 0–44)
AST: 31 U/L (ref 15–41)
Albumin: 2.7 g/dL — ABNORMAL LOW (ref 3.5–5.0)
Alkaline Phosphatase: 49 U/L (ref 38–126)
Anion gap: 8 (ref 5–15)
BUN: 10 mg/dL (ref 8–23)
CO2: 24 mmol/L (ref 22–32)
Calcium: 8.2 mg/dL — ABNORMAL LOW (ref 8.9–10.3)
Chloride: 102 mmol/L (ref 98–111)
Creatinine, Ser: 0.54 mg/dL (ref 0.44–1.00)
GFR, Estimated: >60 mL/min (ref >60)
Glucose, Bld: 109 mg/dL — ABNORMAL HIGH (ref 70–99)
Potassium: 3.2 mmol/L — ABNORMAL LOW (ref 3.5–5.1)
Sodium: 134 mmol/L — ABNORMAL LOW (ref 135–145)
Total Bilirubin: 0.6 mg/dL (ref 0.3–1.2)
Total Protein: 6.6 g/dL (ref 6.5–8.1)

## 2020-12-26 LAB — BASIC METABOLIC PANEL
Anion gap: 8 (ref 5–15)
BUN: 9 mg/dL (ref 8–23)
CO2: 23 mmol/L (ref 22–32)
Calcium: 8.3 mg/dL — ABNORMAL LOW (ref 8.9–10.3)
Chloride: 103 mmol/L (ref 98–111)
Creatinine, Ser: 0.47 mg/dL (ref 0.44–1.00)
GFR, Estimated: >60 mL/min (ref >60)
Glucose, Bld: 127 mg/dL — ABNORMAL HIGH (ref 70–99)
Potassium: 3.5 mmol/L (ref 3.5–5.1)
Sodium: 134 mmol/L — ABNORMAL LOW (ref 135–145)

## 2020-12-26 LAB — CBC
HCT: 33.7 % — ABNORMAL LOW (ref 36.0–46.0)
Hemoglobin: 10.9 g/dL — ABNORMAL LOW (ref 12.0–15.0)
MCH: 31.9 pg (ref 26.0–34.0)
MCHC: 32.3 g/dL (ref 30.0–36.0)
MCV: 98.5 fL (ref 80.0–100.0)
Platelets: 167 10*3/uL (ref 150–400)
RBC: 3.42 MIL/uL — ABNORMAL LOW (ref 3.87–5.11)
RDW: 14.6 % (ref 11.5–15.5)
WBC: 7.8 10*3/uL (ref 4.0–10.5)
nRBC: 0 % (ref 0.0–0.2)

## 2020-12-26 LAB — CBC WITH DIFFERENTIAL/PLATELET
Abs Immature Granulocytes: 0.03 10*3/uL (ref 0.00–0.07)
Basophils Absolute: 0 10*3/uL (ref 0.0–0.1)
Basophils Relative: 0 %
Eosinophils Absolute: 0.1 10*3/uL (ref 0.0–0.5)
Eosinophils Relative: 1 %
HCT: 32.2 % — ABNORMAL LOW (ref 36.0–46.0)
Hemoglobin: 10.3 g/dL — ABNORMAL LOW (ref 12.0–15.0)
Immature Granulocytes: 0 %
Lymphocytes Relative: 8 %
Lymphs Abs: 0.7 10*3/uL (ref 0.7–4.0)
MCH: 31.5 pg (ref 26.0–34.0)
MCHC: 32 g/dL (ref 30.0–36.0)
MCV: 98.5 fL (ref 80.0–100.0)
Monocytes Absolute: 0.5 10*3/uL (ref 0.1–1.0)
Monocytes Relative: 6 %
Neutro Abs: 7.3 10*3/uL (ref 1.7–7.7)
Neutrophils Relative %: 85 %
Platelets: 164 10*3/uL (ref 150–400)
RBC: 3.27 MIL/uL — ABNORMAL LOW (ref 3.87–5.11)
RDW: 14.4 % (ref 11.5–15.5)
WBC: 8.5 10*3/uL (ref 4.0–10.5)
nRBC: 0 % (ref 0.0–0.2)

## 2020-12-26 LAB — C-REACTIVE PROTEIN
CRP: 9.7 mg/dL — ABNORMAL HIGH (ref <1.0)
CRP: 5.4 mg/dL — ABNORMAL HIGH (ref <1.0)

## 2020-12-26 LAB — PHOSPHORUS: Phosphorus: 2.3 mg/dL — ABNORMAL LOW (ref 2.5–4.6)

## 2020-12-26 LAB — HEMOGLOBIN A1C
Hgb A1c MFr Bld: 5.8 % — ABNORMAL HIGH (ref 4.8–5.6)
Mean Plasma Glucose: 119.76 mg/dL

## 2020-12-26 LAB — D-DIMER, QUANTITATIVE
D-Dimer, Quant: 1.55 ug/mL-FEU — ABNORMAL HIGH (ref 0.00–0.50)
D-Dimer, Quant: 1.14 ug/mL-FEU — ABNORMAL HIGH (ref 0.00–0.50)

## 2020-12-26 LAB — FIBRINOGEN: Fibrinogen: 508 mg/dL — ABNORMAL HIGH (ref 210–475)

## 2020-12-26 LAB — FERRITIN
Ferritin: 1505 ng/mL — ABNORMAL HIGH (ref 11–307)
Ferritin: 1420 ng/mL — ABNORMAL HIGH (ref 11–307)

## 2020-12-26 LAB — GLUCOSE, CAPILLARY: Glucose-Capillary: 132 mg/dL — ABNORMAL HIGH (ref 70–99)

## 2020-12-26 MED ORDER — ONDANSETRON HCL 4 MG PO TABS: 4.0000 mg | ORAL_TABLET | Freq: Four times a day (QID) | ORAL | Status: DC | PRN

## 2020-12-26 MED ORDER — ACETAMINOPHEN 325 MG PO TABS
650.0000 mg | ORAL_TABLET | Freq: Four times a day (QID) | ORAL | Status: DC | PRN
  Administered 2020-12-26 – 2020-12-27 (×3): 650 mg via ORAL
  Filled 2020-12-26 (×3): qty 2

## 2020-12-26 MED ORDER — ENOXAPARIN SODIUM 40 MG/0.4ML IJ SOSY
40.0000 mg | PREFILLED_SYRINGE | INTRAMUSCULAR | Status: DC
  Administered 2020-12-26 – 2020-12-27 (×2): 40 mg via SUBCUTANEOUS
  Filled 2020-12-26 (×3): qty 0.4

## 2020-12-26 MED ORDER — ONDANSETRON HCL 4 MG/2ML IJ SOLN: 4.0000 mg | Freq: Four times a day (QID) | INTRAMUSCULAR | Status: DC | PRN

## 2020-12-26 MED ORDER — METOPROLOL TARTRATE 25 MG PO TABS
25.0000 mg | ORAL_TABLET | Freq: Two times a day (BID) | ORAL | Status: DC
  Administered 2020-12-26 – 2020-12-28 (×5): 25 mg via ORAL
  Filled 2020-12-26 (×5): qty 1

## 2020-12-26 MED ORDER — BOOST / RESOURCE BREEZE PO LIQD CUSTOM
1.0000 | Freq: Three times a day (TID) | ORAL | Status: DC
  Administered 2020-12-26 – 2020-12-28 (×6): 1 via ORAL

## 2020-12-26 MED ORDER — ACETAMINOPHEN 650 MG RE SUPP: 650.0000 mg | Freq: Four times a day (QID) | RECTAL | Status: DC | PRN

## 2020-12-26 MED ORDER — PANTOPRAZOLE SODIUM 40 MG IV SOLR
40.0000 mg | Freq: Two times a day (BID) | INTRAVENOUS | Status: DC
  Administered 2020-12-26 – 2020-12-28 (×5): 40 mg via INTRAVENOUS
  Filled 2020-12-26 (×5): qty 40

## 2020-12-26 MED ORDER — POTASSIUM CHLORIDE 10 MEQ/100ML IV SOLN
10.0000 meq | INTRAVENOUS | Status: AC
  Administered 2020-12-26 (×3): 10 meq via INTRAVENOUS
  Filled 2020-12-26 (×3): qty 100

## 2020-12-26 MED ORDER — PANTOPRAZOLE SODIUM 40 MG IV SOLR: 40.0000 mg | Freq: Once | INTRAVENOUS | Status: DC

## 2020-12-26 NOTE — Plan of Care (Signed)
  Problem: Acute Rehab PT Goals(only PT should resolve) Goal: Pt Will Go Supine/Side To Sit Outcome: Progressing Flowsheets (Taken 12/26/2020 1158) Pt will go Supine/Side to Sit:  with supervision  with min guard assist Goal: Patient Will Transfer Sit To/From Stand Outcome: Progressing Flowsheets (Taken 12/26/2020 1158) Patient will transfer sit to/from stand: with supervision Goal: Pt Will Transfer Bed To Chair/Chair To Bed Outcome: Progressing Flowsheets (Taken 12/26/2020 1158) Pt will Transfer Bed to Chair/Chair to Bed: with supervision Goal: Pt Will Ambulate Outcome: Progressing Flowsheets (Taken 12/26/2020 1158) Pt will Ambulate:  50 feet  with supervision  with rolling walker  with least restrictive assistive device   12:00 PM, 12/26/20 Ocie Bob, MPT Physical Therapist with [facility] 336 [phone] office [phone] mobile phone

## 2020-12-26 NOTE — Progress Notes (Signed)
PROGRESS NOTE  Kathryn [name] UKG:[id number] DOB: 04/11/1941 DOA: 12/25/2020 PCP: Suzan Slick, MD  Brief History:  80 year old female with a history of hypertension, diabetes mellitus, GERD, positive ANA and SSA autoantibodies presenting with 3-month history of generalized weakness and decreased oral intake.  Patient states that she has been having upper abdominal pain for a week with associated dysphagia.  She has solid and liquid dysphagia, but it appears that it is worse with solid foods.  She states that about 15 minutes after eating or drinking that she has regurgitation type symptoms and ends up spitting up saliva and liquids.  Due to the swallowing issues, she has had decreased oral intake and worsening generalized weakness.  She denies any fevers, chills, headache, neck pain, chest pain, shortness breath, hemoptysis, hematemesis, diarrhea, dysuria, hematuria.  She denies any new medications.  She is able to swallow her pills and denies any pill dysphagia.  Notably, the patient had an admission to the hospital from 10/23/2020 to 10/25/2020.  At that time, the patient had proximal muscle weakness with elevated CPK up to 1206.  She was treated with prednisone with improvement.  She has since followed up with her primary care provider and the patient has been referred to see rheumatology with whom she has an appointment on January 05, 2021.   In the emergency department, the patient was afebrile hemodynamically stable with oxygen saturation 99% room air.  She was initially tachycardic up to 120s.  BMP showed a sodium 134, potassium 3.2, serum creatinine 0.58.  LFTs were unremarkable.  Lipase 27.  WBC 10.5, hemoglobin 11.7, platelets 181,000.  She was found to be incidentally positive for COVID.  The patient was started on IV fluids and Paxlovid.  She has been vaccinated x3 for COVID-19.  Chest x-ray was negative for infiltrates.  CT abdomen pelvis showed diffuse wall thickening of the cecum  without any other acute findings.  Assessment/Plan: Dysphagia/Cecitis -Unclear if this may be related to some underlying autoimmune disorder given the patient's positive ANA and anti-SSA -10/24/2020 EGD showed normal esophagus which was dilated.  Small hiatus hernia. -GI consult -Clear liquid diet for now -Start IV fluids -Continue ceftriaxone and metronidazole -Start Protonix  COVID-19 infection -Incidental positive -Stable on room air -Personally reviewed chest x-ray--no infiltrates or edema -Continue Paxlovid -CRP 9.7>> 5.4 -D-dimer 1.55>> 1.14 -Ferritin 1505>> 1420  Essential hypertension -Holding losartan -Monitor clinically -Continue metoprolol tartrate  Diabetes mellitus type 2 -Patient is not any agent as outpt -check A1C  Positive ANA and SSA -Patient has outpatient rheumatology appointment on 01/05/2021  Hypokalemia -replete -check mag        Status is: Inpatient  Remains inpatient appropriate because:IV treatments appropriate due to intensity of illness or inability to take PO  Dispo: The patient is from: Home              Anticipated d/c is to: Home              Patient currently is not medically stable to d/c.   Difficult to place patient No        Family Communication:   no Family at bedside  Consultants:  GI  Code Status:  FULL   DVT Prophylaxis:  [place] Lovenox   Procedures: As Listed in Progress Note Above  Antibiotics: Ceftriaxone 6/30>> Metronidazole 6/30>>      Subjective: Patient denies fevers, chills, headache, chest pain, dyspnea,vomiting, diarrhea, abdominal pain, dysuria, hematuria,  hematochezia, and melena.   Objective: Vitals:   12/26/20 0430 12/26/20 0500 12/26/20 0530 12/26/20 0730  BP: 135/74 (!) 149/63 (!) 151/69 (!) 145/72  Pulse: 89 92 94 95  Resp: 18 15 17 17   Temp:      TempSrc:      SpO2: 97% 99% 98% 100%  Weight:        Intake/Output Summary (Last 24 hours) at 12/26/2020 02/26/2021 Last data filed at  12/25/2020 2322 Gross per 24 hour  Intake 1100 ml  Output --  Net 1100 ml   Weight change:  Exam:  General:  Pt is alert, follows commands appropriately, not in acute distress HEENT: No icterus, No thrush, No neck mass, [place]/AT Cardiovascular: RRR, S1/S2, no rubs, no gallops Respiratory: bibasilar crackles. No wheeze Abdomen: Soft/+BS, non tender, non distended, no guarding Extremities: No edema, No lymphangitis, No petechiae, No rashes, no synovitis   Data Reviewed: I have personally reviewed following labs and imaging studies Basic Metabolic Panel: Recent Labs  Lab 12/25/20 1521 12/26/20 0336  NA 134* 134*  K 3.6 3.2*  CL 97* 102  CO2 26 24  GLUCOSE 103* 109*  BUN 15 10  CREATININE 0.58 0.54  CALCIUM 9.0 8.2*  MG  --  1.8  PHOS  --  2.3*   Liver Function Tests: Recent Labs  Lab 12/25/20 1521 12/26/20 0336  AST 40 31  ALT 18 14  ALKPHOS 62 49  BILITOT 1.1 0.6  PROT 7.4 6.6  ALBUMIN 3.2* 2.7*   Recent Labs  Lab 12/25/20 1521  LIPASE 27   No results for input(s): AMMONIA in the last 168 hours. Coagulation Profile: No results for input(s): INR, PROTIME in the last 168 hours. CBC: Recent Labs  Lab 12/25/20 1521 12/26/20 0336  WBC 10.5 8.5  NEUTROABS 9.3* 7.3  HGB 11.7* 10.3*  HCT 35.9* 32.2*  MCV 97.8 98.5  PLT 181 164   Cardiac Enzymes: No results for input(s): CKTOTAL, CKMB, CKMBINDEX, TROPONINI in the last 168 hours. BNP: Invalid input(s): POCBNP CBG: Recent Labs  Lab 12/25/20 1358  GLUCAP 133*   HbA1C: No results for input(s): HGBA1C in the last 72 hours. Urine analysis:    Component Value Date/Time   COLORURINE YELLOW 12/04/2020 0927   APPEARANCEUR HAZY (A) 12/04/2020 0927   LABSPEC 1.023 12/04/2020 0927   PHURINE 6.0 12/04/2020 0927   GLUCOSEU NEGATIVE 12/04/2020 0927   HGBUR NEGATIVE 12/04/2020 0927   BILIRUBINUR NEGATIVE 12/04/2020 0927   KETONESUR 20 (A) 12/04/2020 0927   PROTEINUR 30 (A) 12/04/2020 0927   NITRITE NEGATIVE  12/04/2020 0927   LEUKOCYTESUR NEGATIVE 12/04/2020 0927   Sepsis Labs: @LABRCNTIP (procalcitonin:4,lacticidven:4) ) Recent Results (from the past 240 hour(s))  Resp Panel by RT-PCR (Flu A&B, Covid) Nasopharyngeal Swab     Status: Abnormal   Collection Time: 12/25/20  8:05 PM   Specimen: Nasopharyngeal Swab; Nasopharyngeal(NP) swabs in vial transport medium  Result Value Ref Range Status   SARS Coronavirus 2 by RT PCR POSITIVE (A) NEGATIVE Final    Comment: RESULT CALLED TO, READ BACK BY AND VERIFIED WITH: T WALKER,RN@2211  12/25/20 MKELLY (NOTE) SARS-CoV-2 target nucleic acids are DETECTED.  The SARS-CoV-2 RNA is generally detectable in upper respiratory specimens during the acute phase of infection. Positive results are indicative of the presence of the identified virus, but do not rule out bacterial infection or co-infection with other pathogens not detected by the test. Clinical correlation with patient history and other diagnostic information is necessary to determine patient infection  status. The expected result is Negative.  Fact Sheet for Patients: BloggerCourse.comhttps://www.fda.gov/media/152166/download  Fact Sheet for Healthcare Providers: SeriousBroker.ithttps://www.fda.gov/media/152162/download  This test is not yet approved or cleared by the [place] FDA and  has been authorized for detection and/or diagnosis of SARS-CoV-2 by FDA under an Emergency Use Authorization (EUA).  This EUA will remain in effect (meaning this test can be  used) for the duration of  the COVID-19 declaration under Section 564(b)(1) of the Act, 21 U.S.C. section 360bbb-3(b)(1), unless the authorization is terminated or revoked sooner.     Influenza A by PCR NEGATIVE NEGATIVE Final   Influenza B by PCR NEGATIVE NEGATIVE Final    Comment: (NOTE) The Xpert Xpress SARS-CoV-2/FLU/RSV plus assay is intended as an aid in the diagnosis of influenza from Nasopharyngeal swab specimens and should not be used as a sole basis  for treatment. Nasal washings and aspirates are unacceptable for Xpert Xpress SARS-CoV-2/FLU/RSV testing.  Fact Sheet for Patients: BloggerCourse.comhttps://www.fda.gov/media/152166/download  Fact Sheet for Healthcare Providers: SeriousBroker.ithttps://www.fda.gov/media/152162/download  This test is not yet approved or cleared by the [place] FDA and has been authorized for detection and/or diagnosis of SARS-CoV-2 by FDA under an Emergency Use Authorization (EUA). This EUA will remain in effect (meaning this test can be used) for the duration of the COVID-19 declaration under Section 564(b)(1) of the Act, 21 U.S.C. section 360bbb-3(b)(1), unless the authorization is terminated or revoked.  Performed at [facility], [address] [place], [place] [address]      Scheduled Meds:  nirmatrelvir/ritonavir EUA (renal dosing)  2 tablet Oral BID   Continuous Infusions:  cefTRIAXone (ROCEPHIN)  IV Stopped (12/25/20 2322)   dextrose 5 % and 0.9 % NaCl with KCl 20 mEq/L 75 mL/hr at 12/26/20 0035   metronidazole Stopped (12/26/20 0800)    Procedures/Studies: CT ABDOMEN PELVIS WO CONTRAST  Result Date: 12/04/2020 CLINICAL DATA:  Acute abdominal pain, nonlocalized.  Nausea. EXAM: CT ABDOMEN AND PELVIS WITHOUT CONTRAST TECHNIQUE: Multidetector CT imaging of the abdomen and pelvis was performed following the standard protocol without IV contrast. COMPARISON:  None available FINDINGS: Lower chest: Enteric contrast is seen within the nondilated lower esophagus. Coronary calcification. Hepatobiliary: No focal liver abnormality.Cholecystectomy. Unremarkable biliary tree. Pancreas: Unremarkable. Spleen: Unremarkable. Adrenals/Urinary Tract: Negative adrenals. No hydronephrosis or stone. Unremarkable bladder. Stomach/Bowel:  No obstruction. No appendicitis. Vascular/Lymphatic: No acute vascular abnormality. Atheromatous calcifications. No mass or adenopathy. Reproductive:Ovoid fluid-filled structure in the expected location of the  uterus with small calcified densities peripherally, suspect endometrial cavity thickening from fluid or soft tissue, surrounded by atrophic myometrium, although there is chart history of hysterectomy. Other: No ascites or pneumoperitoneum. Musculoskeletal: No acute abnormalities. Heterogeneous bones diffusely which is likely from osteopenia. Diffuse spinal degeneration with scoliosis and L4-5 anterolisthesis. Marked endplate degeneration at L1-2. IMPRESSION: 1. Negative for bowel obstruction or visible inflammation. 2. Elongated centrally centrally low-density structure in the pelvis, first thought to be atrophic uterus with abnormal endometrial thickening, but there is history of hysterectomy and this may reflect a fluid collection instead. No associated inflammatory changes to correlate with the history. Please correlate with operative history. Electronically Signed   By: Marnee SpringJonathon  Watts M.D.   On: 12/04/2020 12:15   CT ABDOMEN PELVIS W CONTRAST  Result Date: 12/25/2020 CLINICAL DATA:  Weakness, possible infection. EXAM: CT ABDOMEN AND PELVIS WITH CONTRAST TECHNIQUE: Multidetector CT imaging of the abdomen and pelvis was performed using the standard protocol following bolus administration of intravenous contrast. CONTRAST:  100mL OMNIPAQUE IOHEXOL 300 MG/ML  SOLN  COMPARISON:  December 11, 2020. FINDINGS: Lower chest: No acute abnormality. Hepatobiliary: No focal liver abnormality is seen. Status post cholecystectomy. No biliary dilatation. Pancreas: Unremarkable. No pancreatic ductal dilatation or surrounding inflammatory changes. Spleen: Normal in size without focal abnormality. Adrenals/Urinary Tract: Adrenal glands appear normal. Left renal cyst is noted. No hydronephrosis or renal obstruction is noted. Urinary bladder is unremarkable. Stomach/Bowel: The stomach and appendix appear normal. There is no evidence of bowel obstruction. Mild diffuse wall thickening is seen involving the cecum concerning for  infectious or inflammatory colitis. Vascular/Lymphatic: Aortic atherosclerosis. No enlarged abdominal or pelvic lymph nodes. Reproductive: Status post hysterectomy. No adnexal masses. Other: No abdominal wall hernia or abnormality. No abdominopelvic ascites. Musculoskeletal: No acute or significant osseous findings. IMPRESSION: Mild diffuse wall thickening is seen involving the cecum concerning for infectious or inflammatory colitis. Aortic Atherosclerosis (ICD10-I70.0). Electronically Signed   By: Lupita Raider M.D.   On: 12/25/2020 17:38   DG Chest Port 1 View  Result Date: 12/25/2020 CLINICAL DATA:  Shortness of breath and cough. EXAM: PORTABLE CHEST 1 VIEW COMPARISON:  December 04, 2020 FINDINGS: The lungs are hyperinflated. There is no evidence of acute infiltrate, pleural effusion or pneumothorax. The heart size and mediastinal contours are within normal limits. There is marked severity calcification of the aortic arch. Multilevel degenerative changes seen throughout the thoracic spine. IMPRESSION: No active cardiopulmonary disease. Electronically Signed   By: Aram Candela M.D.   On: 12/25/2020 16:25   DG Chest Port 1 View  Result Date: 12/04/2020 CLINICAL DATA:  Cough EXAM: PORTABLE CHEST 1 VIEW COMPARISON:  10/23/2020 FINDINGS: The heart size and mediastinal contours are within normal limits. No focal airspace disease. No pleural effusion or pneumothorax. No acute osseous abnormality. IMPRESSION: No evidence of acute cardiopulmonary disease. Electronically Signed   By: Caprice Renshaw   On: 12/04/2020 10:07    Catarina Hartshorn, DO  [facility]  If 7PM-7AM, please contact night-coverage [email or web address] Password [email or web address] 12/26/2020, 8:12 AM   LOS: 1 day

## 2020-12-26 NOTE — Evaluation (Signed)
Physical Therapy Evaluation Patient Details Name: ELLESE JULIUS MRN: [id number] DOB: 1940/11/25 Today's Date: 12/26/2020   History of Present Illness  MAGGY WYBLE is a 80 y.o. female with medical history significant for diabetes mellitus, hypertension.  Patient presented to ED with complaints of difficulty swallowing ongoing for the past month.  She has having difficulty with solids and liquids.  No difficulty breathing, she reports occasional cough.  Abdominal pain of 1 week duration-mid upper abdomen.  She reports vomiting, but she thinks this is from her difficulty swallowing.  No loose stools.  No fevers no chills.   Clinical Impression  Patient functioning near baseline for functional mobility and gait demonstrating good return for ambulating in room with frequent leaning on nearby objects for support without loss of balance and limited mostly due to fatigue, on room air during gait training with SpO2 at 99%.  Patient tolerated sitting up in chair after therapy with daughter present in room.  Patient will benefit from continued physical therapy in hospital and recommended venue below to increase strength, balance, endurance for safe ADLs and gait.       Follow Up Recommendations Home health PT;Supervision for mobility/OOB;Supervision - Intermittent    Equipment Recommendations  None recommended by PT    Recommendations for Other Services       Precautions / Restrictions Precautions Precautions: Fall Restrictions Weight Bearing Restrictions: No      Mobility  Bed Mobility Overal bed mobility: Needs Assistance Bed Mobility: Rolling;Sidelying to Sit Rolling: Min guard Sidelying to sit: Min assist;Mod assist       General bed mobility comments: increased time, labored movement    Transfers Overall transfer level: Needs assistance Equipment used: 1 person hand held assist;None Transfers: Sit to/from [employer] Sit to Stand: Min guard Stand pivot  transfers: Min guard       General transfer comment: slow labored movement, able to support body weight without use of AD  Ambulation/Gait Ambulation/Gait assistance: Min guard Gait Distance (Feet): 35 Feet Assistive device: None;1 person hand held assist Gait Pattern/deviations: Decreased step length - right;Decreased step length - left;Decreased stride length Gait velocity: decreased   General Gait Details: slow labored cadence with frequent leaning on nearby objects without loss of balance, limited due to fatigue, on room air with SpO2 at 99%  Stairs            Wheelchair Mobility    Modified Rankin (Stroke Patients Only)       Balance Overall balance assessment: Needs assistance Sitting-balance support: Feet supported;No upper extremity supported Sitting balance-Leahy Scale: Good Sitting balance - Comments: seated at EOB   Standing balance support: During functional activity;No upper extremity supported Standing balance-Leahy Scale: Poor Standing balance comment: fair/good static, fair/poor dynamic having to lean on nearby objects for support                             Pertinent Vitals/Pain Pain Assessment: No/denies pain    Home Living Family/patient expects to be discharged to:: Private residence Living Arrangements: Children Available Help at Discharge: Family;Available 24 hours/day Type of Home: House Home Access: Level entry     Home Layout: One level Home Equipment: Walker - 2 wheels;Shower seat      Prior Function Level of Independence: Independent with assistive device(s)         Comments: household ambulator using RW PRN     Hand Dominance  Extremity/Trunk Assessment   Upper Extremity Assessment Upper Extremity Assessment: Generalized weakness    Lower Extremity Assessment Lower Extremity Assessment: Generalized weakness    Cervical / Trunk Assessment Cervical / Trunk Assessment: Normal  Communication    Communication: No difficulties  Cognition Arousal/Alertness: Awake/alert Behavior During Therapy: WFL for tasks assessed/performed Overall Cognitive Status: Within Functional Limits for tasks assessed                                        General Comments      Exercises     Assessment/Plan    PT Assessment Patient needs continued PT services  PT Problem List Decreased strength;Decreased activity tolerance;Decreased balance;Decreased mobility       PT Treatment Interventions DME instruction;Gait training;Stair training;Functional mobility training;Therapeutic activities;Therapeutic exercise;Patient/family education;Balance training    PT Goals (Current goals can be found in the Care Plan section)  Acute Rehab PT Goals Patient Stated Goal: return home with family to assist PT Goal Formulation: With patient/family Time For Goal Achievement: 01/02/21 Potential to Achieve Goals: Good    Frequency Min 3X/week   Barriers to discharge        Co-evaluation               AM-PAC PT "6 Clicks" Mobility  Outcome Measure Help needed turning from your back to your side while in a flat bed without using bedrails?: A Little Help needed moving from lying on your back to sitting on the side of a flat bed without using bedrails?: A Lot Help needed moving to and from a bed to a chair (including a wheelchair)?: A Little Help needed standing up from a chair using your arms (e.g., wheelchair or bedside chair)?: A Little Help needed to walk in hospital room?: A Little Help needed climbing 3-5 steps with a railing? : A Lot 6 Click Score: 16    End of Session   Activity Tolerance: Patient tolerated treatment well;Patient limited by fatigue Patient left: in chair;with call bell/phone within reach;with chair alarm set;with family/visitor present Nurse Communication: Mobility status PT Visit Diagnosis: Unsteadiness on feet (R26.81);Other abnormalities of gait and mobility  (R26.89);Muscle weakness (generalized) (M62.81)    Time: 6378-5885 PT Time Calculation (min) (ACUTE ONLY): 30 min   Charges:   PT Evaluation $PT Eval Moderate Complexity: 1 Mod PT Treatments $Therapeutic Activity: 23-37 mins        11:56 AM, 12/26/20 Ocie Bob, MPT Physical Therapist with [facility] 336 [phone] office [phone] mobile phone

## 2020-12-26 NOTE — ED Notes (Signed)
Rounded on pt this morning. Pt lying in bed in no distress. Brief wet, new dry one applied. Pt able to help RN turn from side to side. Plan of care discussed with patient. Report will be called soon and pt will go up to unit 300. Stretcher is lowered and locked with call bell in reach. Will continue to monitor.

## 2020-12-26 NOTE — Progress Notes (Signed)
Updated daughter Winnifred via phone

## 2020-12-26 NOTE — Care Management Important Message (Signed)
Important Message  Patient Details  Name: Kathryn [name] MRN: [id number] Date of Birth: [birth year]   Medicare Important Message Given:  Yes - Important Message mailed due to current National Emergency     Corey Harold 12/26/2020, 1:37 PM

## 2020-12-26 NOTE — Progress Notes (Signed)
Initial Nutrition Assessment  DOCUMENTATION CODES:      INTERVENTION:  Recommend discontinue Boost Breeze po TID.   Patient prefers Ensure Plus BID  NUTRITION DIAGNOSIS:   Inadequate oral intake related to dysphagia, poor appetite as evidenced by energy intake < 75% for > or equal to 1 month per diet recall.   GOAL:  Patient will meet greater than or equal to 90% of their needs  MONITOR:  PO intake, Supplement acceptance, Labs, Weight trends  REASON FOR ASSESSMENT:   Malnutrition Screening Tool    ASSESSMENT: Patient is an 80 yo female with hx of DM2, HTN. Complaints of swallow problem and eating difficulty. COVID+.   GI consulted. CT abdomen- mild diffuse wall thickening.  Patient meal intake is poor. Patient reports no desire to eat the past month. Says she was doing well in April, feeling good and active around her house then had a decline.  We discussed food preferences. Encouraged protein each meal and high calorie ONS.   Weight currently 56.7 kg and according to hospital records was 56.7 kg on 10/24/20. Expect patient has acute weight loss and malnutrition but unable to capture based on available data.   Medications:  Protonix  Labs: BMP Latest Ref Rng & Units 12/26/2020 12/26/2020 12/25/2020  Glucose 70 - 99 mg/dL 010(X) 323(F) 573(U)  BUN 8 - 23 mg/dL 9 10 15   Creatinine 0.44 - 1.00 mg/dL 2.02 5.42  Sodium 135 - 145 mmol/L 134(L) 134(L) 134(L)  Potassium 3.5 - 5.1 mmol/L 3.5 3.2(L) 3.6  Chloride 98 - 111 mmol/L 103 102 97(L)  CO2 22 - 32 mmol/L 23 24 26   Calcium 8.9 - 10.3 mg/dL 8.3(L) 8.2(L) 9.0      NUTRITION - FOCUSED PHYSICAL EXAM: Nutrition-Focused physical exam completed. Findings are mild buccal fat depletion, moderate clavicle muscle depletion, and no edema.     Diet Order:   Diet Order             DIET DYS 3 Room service appropriate? Yes; Fluid consistency: Thin  Diet effective now                   EDUCATION NEEDS:  Education needs  have been addressed  Skin:  Skin Assessment: Reviewed RN Assessment  Last BM:  6/29  Height:   Ht Readings from Last 1 Encounters:  12/04/20 5\' 3"  (1.6 m)    Weight:   Wt Readings from Last 1 Encounters:  12/25/20 56.7 kg    Ideal Body Weight:   52 kg  BMI:  Body mass index is 22.14 kg/m.  Estimated Nutritional Needs:   Kcal:  1500-1600  Protein:  73-80 gr  Fluid:  > 1500 ml daily   02/03/21 MS,RD,CSG,LDN Contact: 

## 2020-12-26 NOTE — ED Notes (Signed)
Pt's daughter called for an update.  

## 2020-12-26 NOTE — Consult Note (Signed)
Referring Provider: Onnie Boer, MD Primary Care Physician:  Suzan Slick, MD Primary Gastroenterologist:    Reason for Consultation: Colitis, dysphagia  HPI: Kathryn [name] is a 80 y.o. female with history of GERD, hypertension, achalasia (abnormal manometry 2014) presenting to the emergency department with complaints of weakness and inability to eat.    Patient reported being unable to eat or drink very well for approximately 1 month.  She is able to swallow some liquids and soft foods, has most difficulty with solids.  States that after meals, foamy liquid creeps back up her esophagus and she has to spit it out.  She has had some vomiting but believes this is related to her swallowing issues.  Diagnosed with achalasia based on abnormal manometry in 2014.  She denies having Botox or surgical intervention.  She was seen for dysphagia in our facility back in April and completed endoscopy and noted to have a small hiatal hernia but otherwise esophagus appeared fairly normal.  Did not have the appearance of someone with advanced achalasia.  Esophagus dilated.  Patient states dilation did not help and her symptoms have not been progressive.  Denies any refractory heartburn.  Denies any coughing or shortness of breath.  No fever or chills.  No known ill contacts.  Has some issues with constipation, worse since she is unable to eat very much.  No melena or rectal bleeding.  In the emergency department her sodium was 134, creatinine 0.58, albumin 3.2 otherwise LFTs normal, hemoglobin 11.7, hematocrit 35.9, platelets 181,000.  SARS coronavirus 2 was positive.  CT abdomen pelvis with contrast performed for weakness and possible infection.  She had mild diffuse wall thickening seen involving the cecum but otherwise unremarkable.  Chest x-ray no acute cardiopulmonary disease.  CT abdomen pelvis without contrast June 9 was normal bowel.  Recent hospitalization April 2022 when patient presented  with proximal muscle weakness/pain, concern for polymyalgia rheumatica.  ANA panel was positive with positive SSA (Ro) antibody.  Noted improvement on steroids.  Patient has follow-up with rheumatology.   Prior to Admission medications   Medication Sig Start Date End Date Taking? Authorizing Provider  aspirin 81 MG tablet Take 81 mg by mouth daily.   Yes [provider]  losartan (COZAAR) 50 MG tablet Take 50 mg by mouth daily. 09/26/20  Yes [provider]  pantoprazole (PROTONIX) 40 MG tablet Take 40 mg by mouth daily.   Yes [provider]  triamcinolone ointment (KENALOG) 0.1 % Apply topically. 09/26/20  Yes [provider]                    Current Facility-Administered Medications  Medication Dose Route Frequency Provider Last Rate Last Admin   cefTRIAXone (ROCEPHIN) 2 g in sodium chloride 0.9 % 100 mL IVPB  2 g Intravenous Q24H Emokpae, Ejiroghene E, MD   Stopped at 12/25/20 2322   dextrose 5 % and 0.9 % NaCl with KCl 20 mEq/L infusion   Intravenous Continuous Emokpae, Ejiroghene E, MD 75 mL/hr at 12/26/20 0035 New Bag at 12/26/20 0035   enoxaparin (LOVENOX) injection 40 mg  40 mg Subcutaneous Q24H Tat, David, MD       metoprolol tartrate (LOPRESSOR) tablet 25 mg  25 mg Oral BID Tat, David, MD       metroNIDAZOLE (FLAGYL) IVPB 500 mg  500 mg Intravenous Q8H Emokpae, Ejiroghene E, MD   Stopped at 12/26/20 0800   morphine 2 MG/ML injection 2 mg  2  mg Intravenous Q4H PRN Emokpae, Ejiroghene E, MD       nirmatrelvir/ritonavir EUA (renal dosing) (PAXLOVID) TABS 2 tablet  2 tablet Oral BID Adefeso, Oladapo, DO       pantoprazole (PROTONIX) injection 40 mg  40 mg Intravenous Q12H Tat, Onalee Hua, MD       potassium chloride 10 mEq in 100 mL IVPB  10 mEq Intravenous Q1 Hr x 3 Tat, David, MD        Allergies as of 12/25/2020 - Review Complete 12/25/2020  Allergen Reaction Noted   Cyclobenzaprine hcl Swelling     Past Medical History:  Diagnosis Date   Acid  reflux    Diabetes (HCC)    controlled with diet.    HTN (hypertension)     Past Surgical History:  Procedure Laterality Date   ABDOMINAL HYSTERECTOMY     CHOLECYSTECTOMY     ESOPHAGEAL DILATION N/A 10/24/2020   Procedure: ESOPHAGEAL DILATION;  Surgeon: Corbin Ade, MD;  Location: [facility];  Service: Endoscopy;  Laterality: N/A;   ESOPHAGOGASTRODUODENOSCOPY N/A 10/24/2020   Procedure: ESOPHAGOGASTRODUODENOSCOPY (EGD);  Surgeon: Corbin Ade, MD;  Location: [facility];  Service: Endoscopy;  Laterality: N/A;   Histosalpingogram      Family History  Problem Relation Age of Onset   Cancer Other    Diabetes Other    Arthritis Other    Colon cancer Father        diagnosed in his late 22s.   Esophageal cancer Neg Hx    Stomach cancer Neg Hx     Social History   Socioeconomic History   Marital status: Widowed    Spouse name: Not on file   Number of children: Not on file   Years of education: Not on file   Highest education level: Not on file  Occupational History   Not on file  Tobacco Use   Smoking status: Never   Smokeless tobacco: Never  Vaping Use   Vaping Use: Never used  Substance and Sexual Activity   Alcohol use: No   Drug use: No   Sexual activity: Never  Other Topics Concern   Not on file  Social History Narrative   Not on file   Social Determinants of Health   Financial Resource Strain: Not on file  Food Insecurity: Not on file  Transportation Needs: Not on file  Physical Activity: Not on file  Stress: Not on file  Social Connections: Not on file  Intimate Partner Violence: Not on file     ROS:  General: Negative for anorexia, weight loss, fever, chills, fatigue, +weakness. Eyes: Negative for vision changes.  ENT: Negative for hoarseness, +difficulty swallowing , nasal congestion. CV: Negative for chest pain, angina, palpitations, dyspnea on exertion, peripheral edema.  Respiratory: Negative for dyspnea at rest, dyspnea on exertion,  cough, sputum, wheezing.  GI: See history of present illness. GU:  Negative for dysuria, hematuria, urinary incontinence, urinary frequency, nocturnal urination.  MS: Negative for joint pain, low back pain.  Derm: Negative for rash or itching.  Neuro: Negative for weakness, abnormal sensation, seizure, frequent headaches, memory loss, confusion.  Psych: Negative for anxiety, depression, suicidal ideation, hallucinations.  Endo: Negative for unusual weight change.  Heme: Negative for bruising or bleeding. Allergy: Negative for rash or hives.       Physical Examination: Vital signs in last 24 hours: Temp:  [98.8 F (37.1 C)] 98.8 F (37.1 C) (06/30 1355) Pulse Rate:  [89-123] 95 (07/01 0730) Resp:  [  15-25] 17 (07/01 0730) BP: (126-151)/(60-80) 145/72 (07/01 0730) SpO2:  [97 %-100 %] 100 % (07/01 0730) Weight:  [56.7 kg] 56.7 kg (06/30 2305)    General: Elderly frail female in no acute distress.  Cooperative, answers questions appropriately.    Head: Normocephalic, atraumatic.   Eyes: Conjunctiva pink, no icterus. Mouth: Oropharyngeal mucosa moist and pink , no lesions erythema or exudate. Neck: Supple without thyromegaly, masses, or lymphadenopathy.  Lungs: Clear to auscultation bilaterally.  Heart: Regular rate and rhythm, no murmurs rubs or gallops.  Abdomen: Bowel sounds are normal, nontender, nondistended, no hepatosplenomegaly or masses, no abdominal bruits or    hernia , no rebound or guarding.   Rectal: Not performed Extremities: No lower extremity edema, clubbing, deformity.  Neuro: Alert and oriented x 4 , grossly normal neurologically.  Skin: Warm and dry, no rash or jaundice.   Psych: Alert and cooperative, normal mood and affect.        Intake/Output from previous day: 06/30 0701 - 07/01 0700 In: 1100 [IV Piggyback:1100] Out: -  Intake/Output this shift: No intake/output data recorded.  Lab Results: CBC Recent Labs    12/25/20 1521 12/26/20 0336  WBC 10.5  8.5  HGB 11.7* 10.3*  HCT 35.9* 32.2*  MCV 97.8 98.5  PLT 181 164   BMET Recent Labs    12/25/20 1521 12/26/20 0336  NA 134* 134*  K 3.6 3.2*  CL 97* 102  CO2 26 24  GLUCOSE 103* 109*  BUN 15 10  CREATININE 0.58 0.54  CALCIUM 9.0 8.2*   LFT Recent Labs    12/25/20 1521 12/26/20 0336  BILITOT 1.1 0.6  ALKPHOS 62 49  AST 40 31  ALT 18 14  PROT 7.4 6.6  ALBUMIN 3.2* 2.7*    Lipase Recent Labs    12/25/20 1521  LIPASE 27    PT/INR No results for input(s): LABPROT, INR in the last 72 hours.    Imaging Studies: CT ABDOMEN PELVIS WO CONTRAST  Result Date: 12/04/2020 CLINICAL DATA:  Acute abdominal pain, nonlocalized.  Nausea. EXAM: CT ABDOMEN AND PELVIS WITHOUT CONTRAST TECHNIQUE: Multidetector CT imaging of the abdomen and pelvis was performed following the standard protocol without IV contrast. COMPARISON:  None available FINDINGS: Lower chest: Enteric contrast is seen within the nondilated lower esophagus. Coronary calcification. Hepatobiliary: No focal liver abnormality.Cholecystectomy. Unremarkable biliary tree. Pancreas: Unremarkable. Spleen: Unremarkable. Adrenals/Urinary Tract: Negative adrenals. No hydronephrosis or stone. Unremarkable bladder. Stomach/Bowel:  No obstruction. No appendicitis. Vascular/Lymphatic: No acute vascular abnormality. Atheromatous calcifications. No mass or adenopathy. Reproductive:Ovoid fluid-filled structure in the expected location of the uterus with small calcified densities peripherally, suspect endometrial cavity thickening from fluid or soft tissue, surrounded by atrophic myometrium, although there is chart history of hysterectomy. Other: No ascites or pneumoperitoneum. Musculoskeletal: No acute abnormalities. Heterogeneous bones diffusely which is likely from osteopenia. Diffuse spinal degeneration with scoliosis and L4-5 anterolisthesis. Marked endplate degeneration at L1-2. IMPRESSION: 1. Negative for bowel obstruction or visible  inflammation. 2. Elongated centrally centrally low-density structure in the pelvis, first thought to be atrophic uterus with abnormal endometrial thickening, but there is history of hysterectomy and this may reflect a fluid collection instead. No associated inflammatory changes to correlate with the history. Please correlate with operative history. Electronically Signed   By: Marnee SpringJonathon  Watts M.D.   On: 12/04/2020 12:15   CT ABDOMEN PELVIS W CONTRAST  Result Date: 12/25/2020 CLINICAL DATA:  Weakness, possible infection. EXAM: CT ABDOMEN AND PELVIS WITH CONTRAST TECHNIQUE: Multidetector CT imaging  of the abdomen and pelvis was performed using the standard protocol following bolus administration of intravenous contrast. CONTRAST:  OMNIPAQUE IOHEXOL 300 MG/ML  SOLN COMPARISON:  December 11, 2020. FINDINGS: Lower chest: No acute abnormality. Hepatobiliary: No focal liver abnormality is seen. Status post cholecystectomy. No biliary dilatation. Pancreas: Unremarkable. No pancreatic ductal dilatation or surrounding inflammatory changes. Spleen: Normal in size without focal abnormality. Adrenals/Urinary Tract: Adrenal glands appear normal. Left renal cyst is noted. No hydronephrosis or renal obstruction is noted. Urinary bladder is unremarkable. Stomach/Bowel: The stomach and appendix appear normal. There is no evidence of bowel obstruction. Mild diffuse wall thickening is seen involving the cecum concerning for infectious or inflammatory colitis. Vascular/Lymphatic: Aortic atherosclerosis. No enlarged abdominal or pelvic lymph nodes. Reproductive: Status post hysterectomy. No adnexal masses. Other: No abdominal wall hernia or abnormality. No abdominopelvic ascites. Musculoskeletal: No acute or significant osseous findings. IMPRESSION: Mild diffuse wall thickening is seen involving the cecum concerning for infectious or inflammatory colitis. Aortic Atherosclerosis (ICD10-I70.0). Electronically Signed   By: Lupita Raider M.D.   On: 12/25/2020 17:38   DG Chest Port 1 View  Result Date: 12/25/2020 CLINICAL DATA:  Shortness of breath and cough. EXAM: PORTABLE CHEST 1 VIEW COMPARISON:  December 04, 2020 FINDINGS: The lungs are hyperinflated. There is no evidence of acute infiltrate, pleural effusion or pneumothorax. The heart size and mediastinal contours are within normal limits. There is marked severity calcification of the aortic arch. Multilevel degenerative changes seen throughout the thoracic spine. IMPRESSION: No active cardiopulmonary disease. Electronically Signed   By: Aram Candela M.D.   On: 12/25/2020 16:25   DG Chest Port 1 View  Result Date: 12/04/2020 CLINICAL DATA:  Cough EXAM: PORTABLE CHEST 1 VIEW COMPARISON:  10/23/2020 FINDINGS: The heart size and mediastinal contours are within normal limits. No focal airspace disease. No pleural effusion or pneumothorax. No acute osseous abnormality. IMPRESSION: No evidence of acute cardiopulmonary disease. Electronically Signed   By: Caprice Renshaw   On: 12/04/2020 10:07  [4 week]   Impression: Pleasant 80 year old female presenting with diminished oral intake due to dysphagia, weakness.  Personal history of abnormal manometry in 2014, diagnosed with achalasia.  Patient denies undergoing surgical management or Botox injections.  EGD in April with fairly unremarkable esophagus.  Esophagus was dilated given her history of dysphagia and did not seem to make any improvement in her symptoms.  She complains of progressive symptoms over the past several months.  Secretions were clear way back up and she has to spit them out.  Does have some vomiting after meals.  Doubt esophageal stricture, likely related to motility issues.  Could consider barium esophagram in the near future, nonurgent.  If esophagus appear more dilated on imaging with more typical appearance of achalasia, could consider EGD with Botox injection.  Would not rule out possibility of needing repeat esophageal  manometry.  Recent positive ANA and positive SSA (Ro) antibody.  Rheumatology consultation pending for next month.  Sjogren's syndrome could potentially be linked to this particular antibody.  Diffuse wall thickening seen at the cecum on CT yesterday.  Noncontrast CT earlier last month was unremarkable.  Patient appears to be asymptomatic.  Denies abdominal pain or diarrhea.    SARS coronavirus 2 positive.  Management per attending.  Plan: Antibiotic therapy not needed for wall thickening involving the cecum.  Would monitor clinically. Recommend following up with rheumatology as planned. Dysphagia 3 diet if tolerated. Consider barium pill esophagram nonurgently as an outpatient.  If esophagus appeared imaging or typical appearance of achalasia, she may benefit from EGD with Botox injection.  Cannot rule out the possibility of repeat esophageal manometry in the near future.  We would like to thank you for the opportunity to participate in the care of KENZY CAMPOVERDE.  Leanna Battles. Dixon Boos [facility] [phone] 7/1/202212:04 PM   LOS: 1 day

## 2020-12-26 NOTE — Plan of Care (Signed)
  Problem: Education: Goal: Knowledge of General Education information will improve Description: Including pain rating scale, medication(s)/side effects and non-pharmacologic comfort measures Outcome: Progressing   Problem: Health Behavior/Discharge Planning: Goal: Ability to manage health-related needs will improve Outcome: Progressing   Problem: Clinical Measurements: Goal: Will remain free from infection Outcome: Progressing   Problem: Education: Goal: Knowledge of risk factors and measures for prevention of condition will improve Outcome: Progressing   Problem: Coping: Goal: Psychosocial and spiritual needs will be supported Outcome: Progressing

## 2020-12-27 LAB — CBC WITH DIFFERENTIAL/PLATELET
Abs Immature Granulocytes: 0.02 10*3/uL (ref 0.00–0.07)
Basophils Absolute: 0 10*3/uL (ref 0.0–0.1)
Basophils Relative: 0 %
Eosinophils Absolute: 0.1 10*3/uL (ref 0.0–0.5)
Eosinophils Relative: 1 %
HCT: 34.3 % — ABNORMAL LOW (ref 36.0–46.0)
Hemoglobin: 11.1 g/dL — ABNORMAL LOW (ref 12.0–15.0)
Immature Granulocytes: 0 %
Lymphocytes Relative: 9 %
Lymphs Abs: 0.6 10*3/uL — ABNORMAL LOW (ref 0.7–4.0)
MCH: 31.6 pg (ref 26.0–34.0)
MCHC: 32.4 g/dL (ref 30.0–36.0)
MCV: 97.7 fL (ref 80.0–100.0)
Monocytes Absolute: 0.3 10*3/uL (ref 0.1–1.0)
Monocytes Relative: 5 %
Neutro Abs: 5.7 10*3/uL (ref 1.7–7.7)
Neutrophils Relative %: 85 %
Platelets: 164 10*3/uL (ref 150–400)
RBC: 3.51 MIL/uL — ABNORMAL LOW (ref 3.87–5.11)
RDW: 14.4 % (ref 11.5–15.5)
WBC: 6.8 10*3/uL (ref 4.0–10.5)
nRBC: 0 % (ref 0.0–0.2)

## 2020-12-27 LAB — D-DIMER, QUANTITATIVE: D-Dimer, Quant: 1.21 ug/mL-FEU — ABNORMAL HIGH (ref 0.00–0.50)

## 2020-12-27 LAB — MAGNESIUM: Magnesium: 1.8 mg/dL (ref 1.7–2.4)

## 2020-12-27 LAB — COMPREHENSIVE METABOLIC PANEL
ALT: 15 U/L (ref 0–44)
AST: 33 U/L (ref 15–41)
Albumin: 2.6 g/dL — ABNORMAL LOW (ref 3.5–5.0)
Alkaline Phosphatase: 52 U/L (ref 38–126)
Anion gap: 6 (ref 5–15)
BUN: 6 mg/dL — ABNORMAL LOW (ref 8–23)
CO2: 24 mmol/L (ref 22–32)
Calcium: 8.3 mg/dL — ABNORMAL LOW (ref 8.9–10.3)
Chloride: 103 mmol/L (ref 98–111)
Creatinine, Ser: 0.47 mg/dL (ref 0.44–1.00)
GFR, Estimated: >60 mL/min (ref >60)
Glucose, Bld: 125 mg/dL — ABNORMAL HIGH (ref 70–99)
Potassium: 3.8 mmol/L (ref 3.5–5.1)
Sodium: 133 mmol/L — ABNORMAL LOW (ref 135–145)
Total Bilirubin: 0.4 mg/dL (ref 0.3–1.2)
Total Protein: 6.6 g/dL (ref 6.5–8.1)

## 2020-12-27 LAB — GLUCOSE, CAPILLARY: Glucose-Capillary: 121 mg/dL — ABNORMAL HIGH (ref 70–99)

## 2020-12-27 LAB — C-REACTIVE PROTEIN: CRP: 1.7 mg/dL — ABNORMAL HIGH (ref <1.0)

## 2020-12-27 LAB — PHOSPHORUS: Phosphorus: 1.8 mg/dL — ABNORMAL LOW (ref 2.5–4.6)

## 2020-12-27 LAB — FERRITIN: Ferritin: 1340 ng/mL — ABNORMAL HIGH (ref 11–307)

## 2020-12-27 MED ORDER — K PHOS MONO-SOD PHOS DI & MONO 155-852-130 MG PO TABS
500.0000 mg | ORAL_TABLET | Freq: Two times a day (BID) | ORAL | Status: DC
  Administered 2020-12-27 – 2020-12-28 (×3): 500 mg via ORAL
  Filled 2020-12-27 (×3): qty 2

## 2020-12-27 MED ORDER — MAGNESIUM SULFATE 2 GM/50ML IV SOLN
2.0000 g | Freq: Once | INTRAVENOUS | Status: AC
  Administered 2020-12-27: 2 g via INTRAVENOUS
  Filled 2020-12-27: qty 50

## 2020-12-27 MED ORDER — POTASSIUM CHLORIDE IN NACL 20-0.9 MEQ/L-% IV SOLN: INTRAVENOUS | Status: DC

## 2020-12-27 NOTE — Progress Notes (Signed)
PROGRESS NOTE  Kathryn [name]:[id number] DOB: [birth year] DOA: 12/25/2020 PCP: Suzan Slick, MD  Brief History:  80 year old female with a history of hypertension, diabetes mellitus, GERD, positive ANA and SSA autoantibodies presenting with 60-month history of generalized weakness and decreased oral intake.  Patient states that she has been having upper abdominal pain for a week with associated dysphagia.  She has solid and liquid dysphagia, but it appears that it is worse with solid foods.  She states that about 15 minutes after eating or drinking that she has regurgitation type symptoms and ends up spitting up saliva and liquids.  Due to the swallowing issues, she has had decreased oral intake and worsening generalized weakness.  She denies any fevers, chills, headache, neck pain, chest pain, shortness breath, hemoptysis, hematemesis, diarrhea, dysuria, hematuria.  She denies any new medications.  She is able to swallow her pills and denies any pill dysphagia.  Notably, the patient had an admission to the hospital from 10/23/2020 to 10/25/2020.  At that time, the patient had proximal muscle weakness with elevated CPK up to 1206.  She was treated with prednisone with improvement.  She has since followed up with her primary care provider and the patient has been referred to see rheumatology with whom she has an appointment on January 05, 2021.   In the emergency department, the patient was afebrile hemodynamically stable with oxygen saturation 99% room air.  She was initially tachycardic up to 120s.  BMP showed a sodium 134, potassium 3.2, serum creatinine 0.58.  LFTs were unremarkable.  Lipase 27.  WBC 10.5, hemoglobin 11.7, platelets 181,000.  She was found to be incidentally positive for COVID.  The patient was started on IV fluids and Paxlovid.  She has been vaccinated x3 for COVID-19.  Chest x-ray was negative for infiltrates.  CT abdomen pelvis showed diffuse wall thickening of the cecum  without any other acute findings.   Assessment/Plan: Dysphagia/Cecitis -Unclear if this may be related to some underlying autoimmune disorder given the patient's positive ANA and anti-SSA -patient with established hx of Achalasia -10/24/2020 EGD showed normal esophagus which was dilated.  Small hiatus hernia. -GI consult appreciated>> barium pill esophagram nonurgently as an outpatient.  If esophagus appeared imaging or typical appearance of achalasia, she may benefit from EGD with Botox injection. -Clear liquid diet for now>>dys 3 diet -no emesis noted on dys 3 diet -Continue IV fluids -disContinue ceftriaxone and metronidazole -continue Protonix -add boost/ensure; eat small chopped meals   COVID-19 infection -Incidental positive -Stable on room air -Personally reviewed chest x-ray--no infiltrates or edema -Continue Paxlovid -CRP 9.7>> 5.4>>1.7 -D-dimer 1.55>> 1.14>>1.21 -Ferritin 1505>> 1420>>1340   Essential hypertension -Holding losartan -Monitor clinically -Continue metoprolol tartrate   Diabetes mellitus type 2 -Patient is not any agent as outpt -check A1C   Positive ANA and SSA -Patient has outpatient rheumatology appointment on 01/05/2021   Hypokalemia -replete -check mag--1.8               Status is: Inpatient   Remains inpatient appropriate because:IV treatments appropriate due to intensity of illness or inability to take PO   Dispo: The patient is from: Home              Anticipated d/c is to: Home              Patient currently is not medically stable to d/c.  Difficult to place patient No               Family Communication:   no Family at bedside   Consultants:  GI   Code Status:  FULL   DVT Prophylaxis:  [place] Lovenox     Procedures: As Listed in Progress Note Above   Antibiotics: Ceftriaxone 6/30>>7/2 Metronidazole 6/30>>7/2     Subjective: Patient denies fevers, chills, headache, chest pain, dyspnea, nausea,  vomiting, diarrhea, abdominal pain.  She has hypersalivation but no emessis   Objective: Vitals:   12/26/20 1755 12/26/20 2121 12/27/20 0516 12/27/20 1257  BP: (!) 153/82 137/73 (!) 152/79 (!) 162/85  Pulse: 82 85 80 83  Resp: 20 18 (!) 22 19  Temp:  98.3 F (36.8 C) 98 F (36.7 C) 98.1 F (36.7 C)  TempSrc:  Oral Oral Oral  SpO2: 100% 100% 100% 100%  Weight:        Intake/Output Summary (Last 24 hours) at 12/27/2020 1400 Last data filed at 12/26/2020 2223 Gross per 24 hour  Intake 951.28 ml  Output 350 ml  Net 601.28 ml   Weight change:  Exam:  General:  Pt is alert, follows commands appropriately, not in acute distress HEENT: No icterus, No thrush, No neck mass, [place]/AT Cardiovascular: RRR, S1/S2, no rubs, no gallops Respiratory: bibasilar crackles. No wheeze Abdomen: Soft/+BS, non tender, non distended, no guarding Extremities: No edema, No lymphangitis, No petechiae, No rashes, no synovitis   Data Reviewed: I have personally reviewed following labs and imaging studies Basic Metabolic Panel: Recent Labs  Lab 12/25/20 1521 12/26/20 0336 12/26/20 1032 12/27/20 0704  NA 134* 134* 134* 133*  K 3.6 3.2* 3.5 3.8  CL 97* 102 103 103  CO2 26 24 23 24   GLUCOSE 103* 109* 127* 125*  BUN 15 10 9  6*  CREATININE 0.58 0.54 0.47 0.47  CALCIUM 9.0 8.2* 8.3* 8.3*  MG  --  1.8  --  1.8  PHOS  --  2.3*  --  1.8*   Liver Function Tests: Recent Labs  Lab 12/25/20 1521 12/26/20 0336 12/27/20 0704  AST 40 31 33  ALT 18 14 15   ALKPHOS 62 49 52  BILITOT 1.1 0.6 0.4  PROT 7.4 6.6 6.6  ALBUMIN 3.2* 2.7* 2.6*   Recent Labs  Lab 12/25/20 1521  LIPASE 27   No results for input(s): AMMONIA in the last 168 hours. Coagulation Profile: No results for input(s): INR, PROTIME in the last 168 hours. CBC: Recent Labs  Lab 12/25/20 1521 12/26/20 0336 12/26/20 1032 12/27/20 0704  WBC 10.5 8.5 7.8 6.8  NEUTROABS 9.3* 7.3  --  5.7  HGB 11.7* 10.3* 10.9* 11.1*  HCT 35.9* 32.2*  33.7* 34.3*  MCV 97.8 98.5 98.5 97.7  PLT 181 164 167 164   Cardiac Enzymes: No results for input(s): CKTOTAL, CKMB, CKMBINDEX, TROPONINI in the last 168 hours. BNP: Invalid input(s): POCBNP CBG: Recent Labs  Lab 12/25/20 1358 12/26/20 2119 12/27/20 0513  GLUCAP 133* 132* 121*   HbA1C: Recent Labs    12/26/20 0336  HGBA1C 5.8*   Urine analysis:    Component Value Date/Time   COLORURINE YELLOW 12/04/2020 0927   APPEARANCEUR HAZY (A) 12/04/2020 0927   LABSPEC 1.023 12/04/2020 0927   PHURINE 6.0 12/04/2020 0927   GLUCOSEU NEGATIVE 12/04/2020 0927   HGBUR NEGATIVE 12/04/2020 0927   BILIRUBINUR NEGATIVE 12/04/2020 0927   KETONESUR 20 (A) 12/04/2020 0927   PROTEINUR 30 (A) 12/04/2020 0927   NITRITE NEGATIVE  12/04/2020 0927   LEUKOCYTESUR NEGATIVE 12/04/2020 0927   Sepsis Labs: @LABRCNTIP (procalcitonin:4,lacticidven:4) ) Recent Results (from the past 240 hour(s))  Resp Panel by RT-PCR (Flu A&B, Covid) Nasopharyngeal Swab     Status: Abnormal   Collection Time: 12/25/20  8:05 PM   Specimen: Nasopharyngeal Swab; Nasopharyngeal(NP) swabs in vial transport medium  Result Value Ref Range Status   SARS Coronavirus 2 by RT PCR POSITIVE (A) NEGATIVE Final    Comment: RESULT CALLED TO, READ BACK BY AND VERIFIED WITH: T WALKER,RN@2211  12/25/20 MKELLY (NOTE) SARS-CoV-2 target nucleic acids are DETECTED.  The SARS-CoV-2 RNA is generally detectable in upper respiratory specimens during the acute phase of infection. Positive results are indicative of the presence of the identified virus, but do not rule out bacterial infection or co-infection with other pathogens not detected by the test. Clinical correlation with patient history and other diagnostic information is necessary to determine patient infection status. The expected result is Negative.  Fact Sheet for Patients: BloggerCourse.comhttps://www.fda.gov/media/152166/download  Fact Sheet for Healthcare  Providers: SeriousBroker.ithttps://www.fda.gov/media/152162/download  This test is not yet approved or cleared by the [place] FDA and  has been authorized for detection and/or diagnosis of SARS-CoV-2 by FDA under an Emergency Use Authorization (EUA).  This EUA will remain in effect (meaning this test can be  used) for the duration of  the COVID-19 declaration under Section 564(b)(1) of the Act, 21 U.S.C. section 360bbb-3(b)(1), unless the authorization is terminated or revoked sooner.     Influenza A by PCR NEGATIVE NEGATIVE Final   Influenza B by PCR NEGATIVE NEGATIVE Final    Comment: (NOTE) The Xpert Xpress SARS-CoV-2/FLU/RSV plus assay is intended as an aid in the diagnosis of influenza from Nasopharyngeal swab specimens and should not be used as a sole basis for treatment. Nasal washings and aspirates are unacceptable for Xpert Xpress SARS-CoV-2/FLU/RSV testing.  Fact Sheet for Patients: BloggerCourse.comhttps://www.fda.gov/media/152166/download  Fact Sheet for Healthcare Providers: SeriousBroker.ithttps://www.fda.gov/media/152162/download  This test is not yet approved or cleared by the [place] FDA and has been authorized for detection and/or diagnosis of SARS-CoV-2 by FDA under an Emergency Use Authorization (EUA). This EUA will remain in effect (meaning this test can be used) for the duration of the COVID-19 declaration under Section 564(b)(1) of the Act, 21 U.S.C. section 360bbb-3(b)(1), unless the authorization is terminated or revoked.  Performed at [facility], [address] [place], [place] [address]      Scheduled Meds:  enoxaparin (LOVENOX) injection  40 mg Subcutaneous Q24H   feeding supplement  1 Container Oral TID BM   metoprolol tartrate  25 mg Oral BID   nirmatrelvir/ritonavir EUA (renal dosing)  2 tablet Oral BID   pantoprazole (PROTONIX) IV  40 mg Intravenous Q12H   Continuous Infusions:  Procedures/Studies: CT ABDOMEN PELVIS WO CONTRAST  Result Date: 12/04/2020 CLINICAL DATA:   Acute abdominal pain, nonlocalized.  Nausea. EXAM: CT ABDOMEN AND PELVIS WITHOUT CONTRAST TECHNIQUE: Multidetector CT imaging of the abdomen and pelvis was performed following the standard protocol without IV contrast. COMPARISON:  None available FINDINGS: Lower chest: Enteric contrast is seen within the nondilated lower esophagus. Coronary calcification. Hepatobiliary: No focal liver abnormality.Cholecystectomy. Unremarkable biliary tree. Pancreas: Unremarkable. Spleen: Unremarkable. Adrenals/Urinary Tract: Negative adrenals. No hydronephrosis or stone. Unremarkable bladder. Stomach/Bowel:  No obstruction. No appendicitis. Vascular/Lymphatic: No acute vascular abnormality. Atheromatous calcifications. No mass or adenopathy. Reproductive:Ovoid fluid-filled structure in the expected location of the uterus with small calcified densities peripherally, suspect endometrial cavity thickening from fluid or soft tissue, surrounded by  atrophic myometrium, although there is chart history of hysterectomy. Other: No ascites or pneumoperitoneum. Musculoskeletal: No acute abnormalities. Heterogeneous bones diffusely which is likely from osteopenia. Diffuse spinal degeneration with scoliosis and L4-5 anterolisthesis. Marked endplate degeneration at L1-2. IMPRESSION: 1. Negative for bowel obstruction or visible inflammation. 2. Elongated centrally centrally low-density structure in the pelvis, first thought to be atrophic uterus with abnormal endometrial thickening, but there is history of hysterectomy and this may reflect a fluid collection instead. No associated inflammatory changes to correlate with the history. Please correlate with operative history. Electronically Signed   By: Marnee Spring M.D.   On: 12/04/2020 12:15   CT ABDOMEN PELVIS W CONTRAST  Result Date: 12/25/2020 CLINICAL DATA:  Weakness, possible infection. EXAM: CT ABDOMEN AND PELVIS WITH CONTRAST TECHNIQUE: Multidetector CT imaging of the abdomen and pelvis  was performed using the standard protocol following bolus administration of intravenous contrast. CONTRAST:  OMNIPAQUE IOHEXOL 300 MG/ML  SOLN COMPARISON:  December 11, 2020. FINDINGS: Lower chest: No acute abnormality. Hepatobiliary: No focal liver abnormality is seen. Status post cholecystectomy. No biliary dilatation. Pancreas: Unremarkable. No pancreatic ductal dilatation or surrounding inflammatory changes. Spleen: Normal in size without focal abnormality. Adrenals/Urinary Tract: Adrenal glands appear normal. Left renal cyst is noted. No hydronephrosis or renal obstruction is noted. Urinary bladder is unremarkable. Stomach/Bowel: The stomach and appendix appear normal. There is no evidence of bowel obstruction. Mild diffuse wall thickening is seen involving the cecum concerning for infectious or inflammatory colitis. Vascular/Lymphatic: Aortic atherosclerosis. No enlarged abdominal or pelvic lymph nodes. Reproductive: Status post hysterectomy. No adnexal masses. Other: No abdominal wall hernia or abnormality. No abdominopelvic ascites. Musculoskeletal: No acute or significant osseous findings. IMPRESSION: Mild diffuse wall thickening is seen involving the cecum concerning for infectious or inflammatory colitis. Aortic Atherosclerosis (ICD10-I70.0). Electronically Signed   By: Lupita Raider M.D.   On: 12/25/2020 17:38   DG Chest Port 1 View  Result Date: 12/25/2020 CLINICAL DATA:  Shortness of breath and cough. EXAM: PORTABLE CHEST 1 VIEW COMPARISON:  December 04, 2020 FINDINGS: The lungs are hyperinflated. There is no evidence of acute infiltrate, pleural effusion or pneumothorax. The heart size and mediastinal contours are within normal limits. There is marked severity calcification of the aortic arch. Multilevel degenerative changes seen throughout the thoracic spine. IMPRESSION: No active cardiopulmonary disease. Electronically Signed   By: Aram Candela M.D.   On: 12/25/2020 16:25   DG Chest Port 1  View  Result Date: 12/04/2020 CLINICAL DATA:  Cough EXAM: PORTABLE CHEST 1 VIEW COMPARISON:  10/23/2020 FINDINGS: The heart size and mediastinal contours are within normal limits. No focal airspace disease. No pleural effusion or pneumothorax. No acute osseous abnormality. IMPRESSION: No evidence of acute cardiopulmonary disease. Electronically Signed   By: Caprice Renshaw   On: 12/04/2020 10:07    Catarina Hartshorn, DO  [facility]  If 7PM-7AM, please contact night-coverage [email or web address] Password [email or web address] 12/27/2020, 2:00 PM   LOS: 2 days

## 2020-12-28 ENCOUNTER — Telehealth: Payer: Self-pay | Admitting: Internal Medicine

## 2020-12-28 LAB — MAGNESIUM: Magnesium: 1.9 mg/dL (ref 1.7–2.4)

## 2020-12-28 LAB — COMPREHENSIVE METABOLIC PANEL
ALT: 14 U/L (ref 0–44)
AST: 33 U/L (ref 15–41)
Albumin: 2.7 g/dL — ABNORMAL LOW (ref 3.5–5.0)
Alkaline Phosphatase: 49 U/L (ref 38–126)
Anion gap: 6 (ref 5–15)
BUN: 5 mg/dL — ABNORMAL LOW (ref 8–23)
CO2: 22 mmol/L (ref 22–32)
Calcium: 8 mg/dL — ABNORMAL LOW (ref 8.9–10.3)
Chloride: 103 mmol/L (ref 98–111)
Creatinine, Ser: 0.43 mg/dL — ABNORMAL LOW (ref 0.44–1.00)
GFR, Estimated: >60 mL/min (ref >60)
Glucose, Bld: 90 mg/dL (ref 70–99)
Potassium: 3.6 mmol/L (ref 3.5–5.1)
Sodium: 131 mmol/L — ABNORMAL LOW (ref 135–145)
Total Bilirubin: 0.6 mg/dL (ref 0.3–1.2)
Total Protein: 6.7 g/dL (ref 6.5–8.1)

## 2020-12-28 LAB — CBC WITH DIFFERENTIAL/PLATELET
Abs Immature Granulocytes: 0.02 10*3/uL (ref 0.00–0.07)
Basophils Absolute: 0 10*3/uL (ref 0.0–0.1)
Basophils Relative: 0 %
Eosinophils Absolute: 0.1 10*3/uL (ref 0.0–0.5)
Eosinophils Relative: 1 %
HCT: 34.6 % — ABNORMAL LOW (ref 36.0–46.0)
Hemoglobin: 11.3 g/dL — ABNORMAL LOW (ref 12.0–15.0)
Immature Granulocytes: 0 %
Lymphocytes Relative: 12 %
Lymphs Abs: 0.8 10*3/uL (ref 0.7–4.0)
MCH: 31.4 pg (ref 26.0–34.0)
MCHC: 32.7 g/dL (ref 30.0–36.0)
MCV: 96.1 fL (ref 80.0–100.0)
Monocytes Absolute: 0.4 10*3/uL (ref 0.1–1.0)
Monocytes Relative: 6 %
Neutro Abs: 5.4 10*3/uL (ref 1.7–7.7)
Neutrophils Relative %: 81 %
Platelets: 171 10*3/uL (ref 150–400)
RBC: 3.6 MIL/uL — ABNORMAL LOW (ref 3.87–5.11)
RDW: 14.3 % (ref 11.5–15.5)
WBC: 6.7 10*3/uL (ref 4.0–10.5)
nRBC: 0 % (ref 0.0–0.2)

## 2020-12-28 LAB — PHOSPHORUS: Phosphorus: 2.4 mg/dL — ABNORMAL LOW (ref 2.5–4.6)

## 2020-12-28 LAB — FERRITIN: Ferritin: 1766 ng/mL — ABNORMAL HIGH (ref 11–307)

## 2020-12-28 LAB — GLUCOSE, CAPILLARY: Glucose-Capillary: 82 mg/dL (ref 70–99)

## 2020-12-28 LAB — C-REACTIVE PROTEIN: CRP: 1.1 mg/dL — ABNORMAL HIGH (ref <1.0)

## 2020-12-28 LAB — D-DIMER, QUANTITATIVE: D-Dimer, Quant: 1.31 ug/mL-FEU — ABNORMAL HIGH (ref 0.00–0.50)

## 2020-12-28 MED ORDER — NIRMATRELVIR/RITONAVIR (PAXLOVID) TABLET (RENAL DOSING): 2.0000 | ORAL_TABLET | Freq: Two times a day (BID) | ORAL | 0 refills | Status: AC

## 2020-12-28 NOTE — Telephone Encounter (Signed)
Please arrange outpatient follow-up with our clinic with either Dr. Jena Gauss or one of the apps for hospital follow-up visit/dysphagia.  Thank you

## 2020-12-28 NOTE — Progress Notes (Signed)
Subjective: Patient states she is better.  Tolerating liquid diet without issues.  No abdominal pain.  States she ate some of her dinner last night and held it down.  No other complaints.  Objective: Vital signs in last 24 hours: Temp:  [97.8 F (36.6 C)-98.4 F (36.9 C)] 98.4 F (36.9 C) (07/03 0453) Pulse Rate:  [71-83] 75 (07/03 0453) Resp:  [17-19] 17 (07/03 0453) BP: (155-166)/(68-85) 166/75 (07/03 0453) SpO2:  [100 %] 100 % (07/03 0453) Last BM Date: 12/24/20 General:   Alert and oriented, pleasant Head:  Normocephalic and atraumatic. Eyes:  No icterus, sclera clear. Conjuctiva pink.  Mouth:  Without lesions, mucosa pink and moist.  Neck:  Supple, without thyromegaly or masses. .  Abdomen:  Bowel sounds present, soft, non-tender, non-distended. No HSM or hernias noted. No rebound or guarding. No masses appreciated  Msk:  Symmetrical without gross deformities. Normal posture. Pulses:  Normal pulses noted. Extremities:  Without clubbing or edema. Neurologic:  Alert and  oriented x4;  grossly normal neurologically. Skin:  Warm and dry, intact without significant lesions.  Cervical Nodes:  No significant cervical adenopathy. Psych:  Alert and cooperative. Normal mood and affect.  Intake/Output from previous day: 07/02 0701 - 07/03 0700 In: 240.9 [I.V.:240.9] Out: 950 [Urine:750] Intake/Output this shift: Total I/O In: 240 [P.O.:240] Out: 425 [Urine:425]  Lab Results: Recent Labs    12/26/20 1032 12/27/20 0704 12/28/20 0438  WBC 7.8 6.8 6.7  HGB 10.9* 11.1* 11.3*  HCT 33.7* 34.3* 34.6*  PLT 167 164 171   BMET Recent Labs    12/26/20 1032 12/27/20 0704 12/28/20 0438  NA 134* 133* 131*  K 3.5 3.8 3.6  CL 103 103 103  CO2 23 24 22   GLUCOSE 127* 125* 90  BUN 9 6* 5*  CREATININE 0.47 0.47 0.43*  CALCIUM 8.3* 8.3* 8.0*   LFT Recent Labs    12/26/20 0336 12/27/20 0704 12/28/20 0438  PROT 6.6 6.6 6.7  ALBUMIN 2.7* 2.6* 2.7*  AST 31 33 33  ALT 14 15 14    ALKPHOS 49 52 49  BILITOT 0.6 0.4 0.6   PT/INR No results for input(s): LABPROT, INR in the last 72 hours. Hepatitis Panel No results for input(s): HEPBSAG, HCVAB, HEPAIGM, HEPBIGM in the last 72 hours.   Studies/Results: No results found.  Assessment: *Esophageal dysphagia *Achalasia *Poor p.o. intake *Nausea vomiting  Plan: Patient appears clinically improved today.  Tolerating liquid diet.  Does states she has been eating some food last evening without issues of regurgitation, nausea or vomiting.  She did note that she vomited up milk this morning though she states this was due to it being warm and the nausea issue versus obstructive problem.  Being discharged home today.  We will arrange outpatient GI follow-up.  She may benefit from esophagram testing.  She may benefit from repeat EGD with possible Botox injection as well.  Continue on twice daily PPI.  02/28/21. , D.O. Gastroenterology and Hepatology [facility]   LOS: 3 days    12/28/2020, 10:57 AM

## 2020-12-28 NOTE — Discharge Summary (Signed)
Physician Discharge Summary  Kathryn [name] ZOX:[id number] DOB: 1940/10/29 DOA: 12/25/2020  PCP: Suzan Slick, MD  Admit date: 12/25/2020 Discharge date: 12/28/2020  Admitted From: Home Disposition:  Home   Recommendations for Outpatient Follow-up:  Follow up with PCP in 1-2 weeks Please obtain BMP/CBC in one week   Home Health:YES Equipment/Devices:HHPT Discharge Condition: Stable CODE STATUS:FULL Diet recommendation: Dysphagia 3   Brief/Interim Summary: 80 year old female with a history of hypertension, diabetes mellitus, GERD, positive ANA and SSA autoantibodies presenting with 26-month history of generalized weakness and decreased oral intake.  Patient states that she has been having upper abdominal pain for a week with associated dysphagia.  She has solid and liquid dysphagia, but it appears that it is worse with solid foods.  She states that about 15 minutes after eating or drinking that she has regurgitation type symptoms and ends up spitting up saliva and liquids.  Due to the swallowing issues, she has had decreased oral intake and worsening generalized weakness.  She denies any fevers, chills, headache, neck pain, chest pain, shortness breath, hemoptysis, hematemesis, diarrhea, dysuria, hematuria.  She denies any new medications.  She is able to swallow her pills and denies any pill dysphagia.  Notably, the patient had an admission to the hospital from 10/23/2020 to 10/25/2020.  At that time, the patient had proximal muscle weakness with elevated CPK up to 1206.  She was treated with prednisone with improvement.  She has since followed up with her primary care provider and the patient has been referred to see rheumatology with whom she has an appointment on January 05, 2021.   In the emergency department, the patient was afebrile hemodynamically stable with oxygen saturation 99% room air.  She was initially tachycardic up to 120s.  BMP showed a sodium 134, potassium 3.2, serum  creatinine 0.58.  LFTs were unremarkable.  Lipase 27.  WBC 10.5, hemoglobin 11.7, platelets 181,000.  She was found to be incidentally positive for COVID.  The patient was started on IV fluids and Paxlovid.  She has been vaccinated x3 for COVID-19.  Chest x-ray was negative for infiltrates.  CT abdomen pelvis showed diffuse wall thickening of the cecum without any other acute findings.  GI was consulted to assist.  She was initially started on IV abx for her colitis, but this was discontinued after discussion with GI.  She remained afebrile and hemodynamically stable.  GI felt she did not need further endoscopy this admission, but she would need to follow up to arrange for  barium pill esophagram nonurgently as an outpatient and ultimately need EGD for Botox should Achalasia be confirmed.  She was advanced to a dys 3 diet with dietary supplements (Boost or Ensure TID) which she tolerated.    Discharge Diagnoses:  Dysphagia/Cecitis -Unclear if this may be related to some underlying autoimmune disorder given the patient's positive ANA and anti-SSA -patient with established hx of Achalasia -10/24/2020 EGD showed normal esophagus which was dilated.  Small hiatus hernia. -GI consult appreciated>> barium pill esophagram nonurgently as an outpatient.  If esophagus appeared imaging or typical appearance of achalasia, she may benefit from EGD with Botox injection. -Clear liquid diet for now>>dys 3 diet -no emesis noted on dys 3 diet -Continue IV fluids -disContinue ceftriaxone and metronidazole after discussion with GI -continue Protonix -add boost/ensure; eat small chopped meals   COVID-19 infection -Incidental positive -Stable on room air -Personally reviewed chest x-ray--no infiltrates or edema -Continue Paxlovid--had 3 days during hospitalization -CRP 9.7>> 5.4>>1.7>>1.1 -D-dimer  1.55>> 1.14>>1.21>>1.31 -Ferritin 1505>> 1420>>1340>>1766   Essential hypertension -Holding losartan>>restart after  d/c -Monitor clinically -Continue metoprolol tartrate   Diabetes mellitus type 2 -Patient is not any agent as outpt -check A1C--5.8 on 12/26/20   Positive ANA and SSA -Patient has outpatient rheumatology appointment on 01/05/2021   Hypokalemia -repleted -check mag--1.8     Discharge Instructions   Allergies as of 12/28/2020       Reactions   Cyclobenzaprine Hcl Swelling        Medication List     STOP taking these medications    predniSONE 5 MG tablet Commonly known as: DELTASONE       TAKE these medications    aspirin 81 MG tablet Take 81 mg by mouth daily.   losartan 50 MG tablet Commonly known as: COZAAR Take 50 mg by mouth daily.   metoprolol tartrate 25 MG tablet Commonly known as: LOPRESSOR Take 1 tablet (25 mg total) by mouth 2 (two) times daily.   nirmatrelvir/ritonavir EUA (renal dosing) Tabs Commonly known as: PAXLOVID Take 2 tablets by mouth 2 (two) times daily for 5 days. Patient GFR is >60 Take nirmatrelvir (150 mg) one tablet twice daily for 5 days and ritonavir (100 mg) one tablet twice daily for 5 days.   pantoprazole 40 MG tablet Commonly known as: PROTONIX Take 40 mg by mouth daily.   triamcinolone ointment 0.1 % Commonly known as: KENALOG Apply topically.        Allergies  Allergen Reactions   Cyclobenzaprine Hcl Swelling    Consultations: GI   Procedures/Studies: CT ABDOMEN PELVIS WO CONTRAST  Result Date: 12/04/2020 CLINICAL DATA:  Acute abdominal pain, nonlocalized.  Nausea. EXAM: CT ABDOMEN AND PELVIS WITHOUT CONTRAST TECHNIQUE: Multidetector CT imaging of the abdomen and pelvis was performed following the standard protocol without IV contrast. COMPARISON:  None available FINDINGS: Lower chest: Enteric contrast is seen within the nondilated lower esophagus. Coronary calcification. Hepatobiliary: No focal liver abnormality.Cholecystectomy. Unremarkable biliary tree. Pancreas: Unremarkable. Spleen: Unremarkable.  Adrenals/Urinary Tract: Negative adrenals. No hydronephrosis or stone. Unremarkable bladder. Stomach/Bowel:  No obstruction. No appendicitis. Vascular/Lymphatic: No acute vascular abnormality. Atheromatous calcifications. No mass or adenopathy. Reproductive:Ovoid fluid-filled structure in the expected location of the uterus with small calcified densities peripherally, suspect endometrial cavity thickening from fluid or soft tissue, surrounded by atrophic myometrium, although there is chart history of hysterectomy. Other: No ascites or pneumoperitoneum. Musculoskeletal: No acute abnormalities. Heterogeneous bones diffusely which is likely from osteopenia. Diffuse spinal degeneration with scoliosis and L4-5 anterolisthesis. Marked endplate degeneration at L1-2. IMPRESSION: 1. Negative for bowel obstruction or visible inflammation. 2. Elongated centrally centrally low-density structure in the pelvis, first thought to be atrophic uterus with abnormal endometrial thickening, but there is history of hysterectomy and this may reflect a fluid collection instead. No associated inflammatory changes to correlate with the history. Please correlate with operative history. Electronically Signed   By: Marnee Spring M.D.   On: 12/04/2020 12:15   CT ABDOMEN PELVIS W CONTRAST  Result Date: 12/25/2020 CLINICAL DATA:  Weakness, possible infection. EXAM: CT ABDOMEN AND PELVIS WITH CONTRAST TECHNIQUE: Multidetector CT imaging of the abdomen and pelvis was performed using the standard protocol following bolus administration of intravenous contrast. CONTRAST:  OMNIPAQUE IOHEXOL 300 MG/ML  SOLN COMPARISON:  December 11, 2020. FINDINGS: Lower chest: No acute abnormality. Hepatobiliary: No focal liver abnormality is seen. Status post cholecystectomy. No biliary dilatation. Pancreas: Unremarkable. No pancreatic ductal dilatation or surrounding inflammatory changes. Spleen: Normal in size without focal abnormality.  Adrenals/Urinary  Tract: Adrenal glands appear normal. Left renal cyst is noted. No hydronephrosis or renal obstruction is noted. Urinary bladder is unremarkable. Stomach/Bowel: The stomach and appendix appear normal. There is no evidence of bowel obstruction. Mild diffuse wall thickening is seen involving the cecum concerning for infectious or inflammatory colitis. Vascular/Lymphatic: Aortic atherosclerosis. No enlarged abdominal or pelvic lymph nodes. Reproductive: Status post hysterectomy. No adnexal masses. Other: No abdominal wall hernia or abnormality. No abdominopelvic ascites. Musculoskeletal: No acute or significant osseous findings. IMPRESSION: Mild diffuse wall thickening is seen involving the cecum concerning for infectious or inflammatory colitis. Aortic Atherosclerosis (ICD10-I70.0). Electronically Signed   By: Lupita RaiderJames  Green Jr M.D.   On: 12/25/2020 17:38   DG Chest Port 1 View  Result Date: 12/25/2020 CLINICAL DATA:  Shortness of breath and cough. EXAM: PORTABLE CHEST 1 VIEW COMPARISON:  December 04, 2020 FINDINGS: The lungs are hyperinflated. There is no evidence of acute infiltrate, pleural effusion or pneumothorax. The heart size and mediastinal contours are within normal limits. There is marked severity calcification of the aortic arch. Multilevel degenerative changes seen throughout the thoracic spine. IMPRESSION: No active cardiopulmonary disease. Electronically Signed   By: Aram Candelahaddeus  Houston M.D.   On: 12/25/2020 16:25   DG Chest Port 1 View  Result Date: 12/04/2020 CLINICAL DATA:  Cough EXAM: PORTABLE CHEST 1 VIEW COMPARISON:  10/23/2020 FINDINGS: The heart size and mediastinal contours are within normal limits. No focal airspace disease. No pleural effusion or pneumothorax. No acute osseous abnormality. IMPRESSION: No evidence of acute cardiopulmonary disease. Electronically Signed   By: Caprice RenshawJacob  Kahn   On: 12/04/2020 10:07        Discharge Exam: Vitals:   12/27/20 1954 12/28/20 0453  BP: (!) 155/68  (!) 166/75  Pulse: 71 75  Resp: 18 17  Temp: 97.8 F (36.6 C) 98.4 F (36.9 C)  SpO2: 100% 100%   Vitals:   12/27/20 0516 12/27/20 1257 12/27/20 1954 12/28/20 0453  BP: (!) 152/79 (!) 162/85 (!) 155/68 (!) 166/75  Pulse: 80 83 71 75  Resp: (!) 22 19 18 17   Temp: 98 F (36.7 C) 98.1 F (36.7 C) 97.8 F (36.6 C) 98.4 F (36.9 C)  TempSrc: Oral Oral Oral Oral  SpO2: 100% 100% 100% 100%  Weight:        General: Pt is alert, awake, not in acute distress Cardiovascular: RRR, S1/S2 +, no rubs, no gallops Respiratory: fine bibasilar crackles. No wheeze Abdominal: Soft, NT, ND, bowel sounds + Extremities: no edema, no cyanosis   The results of significant diagnostics from this hospitalization (including imaging, microbiology, ancillary and laboratory) are listed below for reference.    Significant Diagnostic Studies: CT ABDOMEN PELVIS WO CONTRAST  Result Date: 12/04/2020 CLINICAL DATA:  Acute abdominal pain, nonlocalized.  Nausea. EXAM: CT ABDOMEN AND PELVIS WITHOUT CONTRAST TECHNIQUE: Multidetector CT imaging of the abdomen and pelvis was performed following the standard protocol without IV contrast. COMPARISON:  None available FINDINGS: Lower chest: Enteric contrast is seen within the nondilated lower esophagus. Coronary calcification. Hepatobiliary: No focal liver abnormality.Cholecystectomy. Unremarkable biliary tree. Pancreas: Unremarkable. Spleen: Unremarkable. Adrenals/Urinary Tract: Negative adrenals. No hydronephrosis or stone. Unremarkable bladder. Stomach/Bowel:  No obstruction. No appendicitis. Vascular/Lymphatic: No acute vascular abnormality. Atheromatous calcifications. No mass or adenopathy. Reproductive:Ovoid fluid-filled structure in the expected location of the uterus with small calcified densities peripherally, suspect endometrial cavity thickening from fluid or soft tissue, surrounded by atrophic myometrium, although there is chart history of hysterectomy. Other: No  ascites or  pneumoperitoneum. Musculoskeletal: No acute abnormalities. Heterogeneous bones diffusely which is likely from osteopenia. Diffuse spinal degeneration with scoliosis and L4-5 anterolisthesis. Marked endplate degeneration at L1-2. IMPRESSION: 1. Negative for bowel obstruction or visible inflammation. 2. Elongated centrally centrally low-density structure in the pelvis, first thought to be atrophic uterus with abnormal endometrial thickening, but there is history of hysterectomy and this may reflect a fluid collection instead. No associated inflammatory changes to correlate with the history. Please correlate with operative history. Electronically Signed   By: Marnee Spring M.D.   On: 12/04/2020 12:15   CT ABDOMEN PELVIS W CONTRAST  Result Date: 12/25/2020 CLINICAL DATA:  Weakness, possible infection. EXAM: CT ABDOMEN AND PELVIS WITH CONTRAST TECHNIQUE: Multidetector CT imaging of the abdomen and pelvis was performed using the standard protocol following bolus administration of intravenous contrast. CONTRAST:  OMNIPAQUE IOHEXOL 300 MG/ML  SOLN COMPARISON:  December 11, 2020. FINDINGS: Lower chest: No acute abnormality. Hepatobiliary: No focal liver abnormality is seen. Status post cholecystectomy. No biliary dilatation. Pancreas: Unremarkable. No pancreatic ductal dilatation or surrounding inflammatory changes. Spleen: Normal in size without focal abnormality. Adrenals/Urinary Tract: Adrenal glands appear normal. Left renal cyst is noted. No hydronephrosis or renal obstruction is noted. Urinary bladder is unremarkable. Stomach/Bowel: The stomach and appendix appear normal. There is no evidence of bowel obstruction. Mild diffuse wall thickening is seen involving the cecum concerning for infectious or inflammatory colitis. Vascular/Lymphatic: Aortic atherosclerosis. No enlarged abdominal or pelvic lymph nodes. Reproductive: Status post hysterectomy. No adnexal masses. Other: No abdominal wall hernia or  abnormality. No abdominopelvic ascites. Musculoskeletal: No acute or significant osseous findings. IMPRESSION: Mild diffuse wall thickening is seen involving the cecum concerning for infectious or inflammatory colitis. Aortic Atherosclerosis (ICD10-I70.0). Electronically Signed   By: Lupita Raider M.D.   On: 12/25/2020 17:38   DG Chest Port 1 View  Result Date: 12/25/2020 CLINICAL DATA:  Shortness of breath and cough. EXAM: PORTABLE CHEST 1 VIEW COMPARISON:  December 04, 2020 FINDINGS: The lungs are hyperinflated. There is no evidence of acute infiltrate, pleural effusion or pneumothorax. The heart size and mediastinal contours are within normal limits. There is marked severity calcification of the aortic arch. Multilevel degenerative changes seen throughout the thoracic spine. IMPRESSION: No active cardiopulmonary disease. Electronically Signed   By: Aram Candela M.D.   On: 12/25/2020 16:25   DG Chest Port 1 View  Result Date: 12/04/2020 CLINICAL DATA:  Cough EXAM: PORTABLE CHEST 1 VIEW COMPARISON:  10/23/2020 FINDINGS: The heart size and mediastinal contours are within normal limits. No focal airspace disease. No pleural effusion or pneumothorax. No acute osseous abnormality. IMPRESSION: No evidence of acute cardiopulmonary disease. Electronically Signed   By: Caprice Renshaw   On: 12/04/2020 10:07    Microbiology: Recent Results (from the past 240 hour(s))  Resp Panel by RT-PCR (Flu A&B, Covid) Nasopharyngeal Swab     Status: Abnormal   Collection Time: 12/25/20  8:05 PM   Specimen: Nasopharyngeal Swab; Nasopharyngeal(NP) swabs in vial transport medium  Result Value Ref Range Status   SARS Coronavirus 2 by RT PCR POSITIVE (A) NEGATIVE Final    Comment: RESULT CALLED TO, READ BACK BY AND VERIFIED WITH: T WALKER,RN@[id number]  12/25/20 MKELLY (NOTE) SARS-CoV-2 target nucleic acids are DETECTED.  The SARS-CoV-2 RNA is generally detectable in upper respiratory specimens during the acute phase of  infection. Positive results are indicative of the presence of the identified virus, but do not rule out bacterial infection or co-infection with other pathogens not detected  by the test. Clinical correlation with patient history and other diagnostic information is necessary to determine patient infection status. The expected result is Negative.  Fact Sheet for Patients: BloggerCourse.com  Fact Sheet for Healthcare Providers: SeriousBroker.it  This test is not yet approved or cleared by the [place] FDA and  has been authorized for detection and/or diagnosis of SARS-CoV-2 by FDA under an Emergency Use Authorization (EUA).  This EUA will remain in effect (meaning this test can be  used) for the duration of  the COVID-19 declaration under Section 564(b)(1) of the Act, 21 U.S.C. section 360bbb-3(b)(1), unless the authorization is terminated or revoked sooner.     Influenza A by PCR NEGATIVE NEGATIVE Final   Influenza B by PCR NEGATIVE NEGATIVE Final    Comment: (NOTE) The Xpert Xpress SARS-CoV-2/FLU/RSV plus assay is intended as an aid in the diagnosis of influenza from Nasopharyngeal swab specimens and should not be used as a sole basis for treatment. Nasal washings and aspirates are unacceptable for Xpert Xpress SARS-CoV-2/FLU/RSV testing.  Fact Sheet for Patients: BloggerCourse.com  Fact Sheet for Healthcare Providers: SeriousBroker.it  This test is not yet approved or cleared by the [place] FDA and has been authorized for detection and/or diagnosis of SARS-CoV-2 by FDA under an Emergency Use Authorization (EUA). This EUA will remain in effect (meaning this test can be used) for the duration of the COVID-19 declaration under Section 564(b)(1) of the Act, 21 U.S.C. section 360bbb-3(b)(1), unless the authorization is terminated or revoked.  Performed at [facility], [address] [place], [place] [address]      Labs: Basic Metabolic Panel: Recent Labs  Lab 12/25/20 1521 12/26/20 0336 12/26/20 1032 12/27/20 0704 12/28/20 0438  NA 134* 134* 134* 133* 131*  K 3.6 3.2* 3.5 3.8 3.6  CL 97* 102 103 103 103  CO2 26 24 23 24 22   GLUCOSE 103* 109* 127* 125* 90  BUN 15 10 9  6* 5*  CREATININE 0.58 0.54 0.47 0.47 0.43*  CALCIUM 9.0 8.2* 8.3* 8.3* 8.0*  MG  --  1.8  --  1.8 1.9  PHOS  --  2.3*  --  1.8* 2.4*   Liver Function Tests: Recent Labs  Lab 12/25/20 1521 12/26/20 0336 12/27/20 0704 12/28/20 0438  AST 40 31 33 33  ALT 18 14 15 14   ALKPHOS 62 49 52 49  BILITOT 1.1 0.6 0.4 0.6  PROT 7.4 6.6 6.6 6.7  ALBUMIN 3.2* 2.7* 2.6* 2.7*   Recent Labs  Lab 12/25/20 1521  LIPASE 27   No results for input(s): AMMONIA in the last 168 hours. CBC: Recent Labs  Lab 12/25/20 1521 12/26/20 0336 12/26/20 1032 12/27/20 0704 12/28/20 0438  WBC 10.5 8.5 7.8 6.8 6.7  NEUTROABS 9.3* 7.3  --  5.7 5.4  HGB 11.7* 10.3* 10.9* 11.1* 11.3*  HCT 35.9* 32.2* 33.7* 34.3* 34.6*  MCV 97.8 98.5 98.5 97.7 96.1  PLT 181 164 167 164 171   Cardiac Enzymes: No results for input(s): CKTOTAL, CKMB, CKMBINDEX, TROPONINI in the last 168 hours. BNP: Invalid input(s): POCBNP CBG: Recent Labs  Lab 12/25/20 1358 12/26/20 2119 12/27/20 0513 12/28/20 0452  GLUCAP 133* 132* 121* 82    Time coordinating discharge:  36 minutes  Signed:  2120, DO [facility] Pager: [phone] 12/28/2020, 10:24 AM

## 2020-12-30 ENCOUNTER — Encounter: Payer: Self-pay | Admitting: Internal Medicine

## 2020-12-30 NOTE — Telephone Encounter (Signed)
OV made °

## 2021-01-12 ENCOUNTER — Telehealth: Payer: Self-pay | Admitting: Internal Medicine

## 2021-01-12 NOTE — Telephone Encounter (Signed)
Tried to call pt.  Had to leave a voice mail. 

## 2021-01-12 NOTE — Telephone Encounter (Signed)
I received a MyChart message stating it was an emergency that patient was seen sooner than 11/4 at 9am with Ermalinda Memos, PA. Pt is on a cancellation wait list. Nothing is available any sooner. Please advise and call [phone]

## 2021-01-13 ENCOUNTER — Encounter (HOSPITAL_COMMUNITY): Payer: Self-pay | Admitting: Emergency Medicine

## 2021-01-13 ENCOUNTER — Emergency Department (HOSPITAL_COMMUNITY): Payer: Medicare Other

## 2021-01-13 ENCOUNTER — Inpatient Hospital Stay (HOSPITAL_COMMUNITY)
Admission: EM | Admit: 2021-01-13 | Discharge: 2021-02-06 | DRG: 329 | Disposition: A | Discharge status: Skilled Nursing Facility | Payer: Medicare Other | Attending: Family Medicine | Admitting: Family Medicine

## 2021-01-13 ENCOUNTER — Other Ambulatory Visit: Payer: Self-pay

## 2021-01-13 DIAGNOSIS — Z7189 Other specified counseling: Secondary | ICD-10-CM | POA: Diagnosis not present

## 2021-01-13 DIAGNOSIS — R112 Nausea with vomiting, unspecified: Secondary | ICD-10-CM | POA: Diagnosis not present

## 2021-01-13 DIAGNOSIS — R9431 Abnormal electrocardiogram [ECG] [EKG]: Secondary | ICD-10-CM

## 2021-01-13 DIAGNOSIS — N390 Urinary tract infection, site not specified: Secondary | ICD-10-CM | POA: Diagnosis present

## 2021-01-13 DIAGNOSIS — Z431 Encounter for attention to gastrostomy: Secondary | ICD-10-CM

## 2021-01-13 DIAGNOSIS — K5900 Constipation, unspecified: Secondary | ICD-10-CM

## 2021-01-13 DIAGNOSIS — K449 Diaphragmatic hernia without obstruction or gangrene: Secondary | ICD-10-CM | POA: Diagnosis present

## 2021-01-13 DIAGNOSIS — D72829 Elevated white blood cell count, unspecified: Secondary | ICD-10-CM | POA: Diagnosis not present

## 2021-01-13 DIAGNOSIS — R1915 Other abnormal bowel sounds: Secondary | ICD-10-CM | POA: Diagnosis not present

## 2021-01-13 DIAGNOSIS — R1310 Dysphagia, unspecified: Secondary | ICD-10-CM

## 2021-01-13 DIAGNOSIS — E46 Unspecified protein-calorie malnutrition: Secondary | ICD-10-CM | POA: Diagnosis not present

## 2021-01-13 DIAGNOSIS — K219 Gastro-esophageal reflux disease without esophagitis: Secondary | ICD-10-CM | POA: Diagnosis present

## 2021-01-13 DIAGNOSIS — E860 Dehydration: Secondary | ICD-10-CM | POA: Diagnosis present

## 2021-01-13 DIAGNOSIS — M1990 Unspecified osteoarthritis, unspecified site: Secondary | ICD-10-CM | POA: Diagnosis present

## 2021-01-13 DIAGNOSIS — R1013 Epigastric pain: Secondary | ICD-10-CM

## 2021-01-13 DIAGNOSIS — U071 COVID-19: Secondary | ICD-10-CM | POA: Diagnosis present

## 2021-01-13 DIAGNOSIS — K220 Achalasia of cardia: Principal | ICD-10-CM | POA: Diagnosis present

## 2021-01-13 DIAGNOSIS — R54 Age-related physical debility: Secondary | ICD-10-CM | POA: Diagnosis present

## 2021-01-13 DIAGNOSIS — K567 Ileus, unspecified: Secondary | ICD-10-CM | POA: Diagnosis not present

## 2021-01-13 DIAGNOSIS — Z515 Encounter for palliative care: Secondary | ICD-10-CM | POA: Diagnosis not present

## 2021-01-13 DIAGNOSIS — Z8616 Personal history of COVID-19: Secondary | ICD-10-CM | POA: Diagnosis not present

## 2021-01-13 DIAGNOSIS — E440 Moderate protein-calorie malnutrition: Secondary | ICD-10-CM | POA: Diagnosis present

## 2021-01-13 DIAGNOSIS — L89322 Pressure ulcer of left buttock, stage 2: Secondary | ICD-10-CM | POA: Diagnosis not present

## 2021-01-13 DIAGNOSIS — T85528A Displacement of other gastrointestinal prosthetic devices, implants and grafts, initial encounter: Secondary | ICD-10-CM

## 2021-01-13 DIAGNOSIS — I10 Essential (primary) hypertension: Secondary | ICD-10-CM | POA: Diagnosis present

## 2021-01-13 DIAGNOSIS — M3500 Sicca syndrome, unspecified: Secondary | ICD-10-CM | POA: Diagnosis present

## 2021-01-13 DIAGNOSIS — K3184 Gastroparesis: Secondary | ICD-10-CM | POA: Diagnosis not present

## 2021-01-13 DIAGNOSIS — R0602 Shortness of breath: Secondary | ICD-10-CM

## 2021-01-13 DIAGNOSIS — Z681 Body mass index (BMI) 19 or less, adult: Secondary | ICD-10-CM | POA: Diagnosis not present

## 2021-01-13 DIAGNOSIS — R109 Unspecified abdominal pain: Secondary | ICD-10-CM

## 2021-01-13 DIAGNOSIS — E871 Hypo-osmolality and hyponatremia: Secondary | ICD-10-CM | POA: Diagnosis not present

## 2021-01-13 DIAGNOSIS — Z79899 Other long term (current) drug therapy: Secondary | ICD-10-CM

## 2021-01-13 DIAGNOSIS — R64 Cachexia: Secondary | ICD-10-CM | POA: Diagnosis not present

## 2021-01-13 DIAGNOSIS — K660 Peritoneal adhesions (postprocedural) (postinfection): Secondary | ICD-10-CM | POA: Diagnosis present

## 2021-01-13 DIAGNOSIS — E876 Hypokalemia: Secondary | ICD-10-CM | POA: Diagnosis not present

## 2021-01-13 DIAGNOSIS — K229 Disease of esophagus, unspecified: Secondary | ICD-10-CM | POA: Diagnosis not present

## 2021-01-13 DIAGNOSIS — E1143 Type 2 diabetes mellitus with diabetic autonomic (poly)neuropathy: Secondary | ICD-10-CM | POA: Diagnosis not present

## 2021-01-13 DIAGNOSIS — E1159 Type 2 diabetes mellitus with other circulatory complications: Secondary | ICD-10-CM | POA: Diagnosis present

## 2021-01-13 DIAGNOSIS — Z66 Do not resuscitate: Secondary | ICD-10-CM | POA: Diagnosis not present

## 2021-01-13 DIAGNOSIS — Z95828 Presence of other vascular implants and grafts: Secondary | ICD-10-CM | POA: Diagnosis not present

## 2021-01-13 DIAGNOSIS — R531 Weakness: Secondary | ICD-10-CM

## 2021-01-13 DIAGNOSIS — E1165 Type 2 diabetes mellitus with hyperglycemia: Secondary | ICD-10-CM | POA: Diagnosis present

## 2021-01-13 DIAGNOSIS — J690 Pneumonitis due to inhalation of food and vomit: Secondary | ICD-10-CM | POA: Diagnosis not present

## 2021-01-13 DIAGNOSIS — L8990 Pressure ulcer of unspecified site, unspecified stage: Secondary | ICD-10-CM | POA: Insufficient documentation

## 2021-01-13 DIAGNOSIS — R1010 Upper abdominal pain, unspecified: Secondary | ICD-10-CM | POA: Diagnosis not present

## 2021-01-13 DIAGNOSIS — D649 Anemia, unspecified: Secondary | ICD-10-CM | POA: Diagnosis present

## 2021-01-13 DIAGNOSIS — K2970 Gastritis, unspecified, without bleeding: Secondary | ICD-10-CM | POA: Diagnosis not present

## 2021-01-13 DIAGNOSIS — K5641 Fecal impaction: Secondary | ICD-10-CM | POA: Diagnosis not present

## 2021-01-13 DIAGNOSIS — M329 Systemic lupus erythematosus, unspecified: Secondary | ICD-10-CM | POA: Diagnosis present

## 2021-01-13 DIAGNOSIS — Z800 Family history of malignant neoplasm of digestive organs: Secondary | ICD-10-CM

## 2021-01-13 DIAGNOSIS — R1319 Other dysphagia: Secondary | ICD-10-CM | POA: Diagnosis not present

## 2021-01-13 DIAGNOSIS — Z7982 Long term (current) use of aspirin: Secondary | ICD-10-CM

## 2021-01-13 DIAGNOSIS — K5901 Slow transit constipation: Secondary | ICD-10-CM | POA: Diagnosis not present

## 2021-01-13 DIAGNOSIS — R739 Hyperglycemia, unspecified: Secondary | ICD-10-CM

## 2021-01-13 DIAGNOSIS — Z888 Allergy status to other drugs, medicaments and biological substances status: Secondary | ICD-10-CM

## 2021-01-13 DIAGNOSIS — E8809 Other disorders of plasma-protein metabolism, not elsewhere classified: Secondary | ICD-10-CM | POA: Diagnosis present

## 2021-01-13 LAB — URINALYSIS, ROUTINE W REFLEX MICROSCOPIC
Bilirubin Urine: NEGATIVE
Cellular Cast, UA: 4
Glucose, UA: NEGATIVE mg/dL
Ketones, ur: 20 mg/dL — AB
Nitrite: POSITIVE — AB
Protein, ur: 100 mg/dL — AB
Specific Gravity, Urine: 1.025 (ref 1.005–1.030)
WBC, UA: >50 WBC/hpf — ABNORMAL HIGH (ref 0–5)
pH: 5 (ref 5.0–8.0)

## 2021-01-13 LAB — COMPREHENSIVE METABOLIC PANEL
ALT: 15 U/L (ref 0–44)
AST: 27 U/L (ref 15–41)
Albumin: 3.2 g/dL — ABNORMAL LOW (ref 3.5–5.0)
Alkaline Phosphatase: 54 U/L (ref 38–126)
Anion gap: 13 (ref 5–15)
BUN: 30 mg/dL — ABNORMAL HIGH (ref 8–23)
CO2: 27 mmol/L (ref 22–32)
Calcium: 9.3 mg/dL (ref 8.9–10.3)
Chloride: 100 mmol/L (ref 98–111)
Creatinine, Ser: 0.75 mg/dL (ref 0.44–1.00)
GFR, Estimated: >60 mL/min (ref >60)
Glucose, Bld: 135 mg/dL — ABNORMAL HIGH (ref 70–99)
Potassium: 3.6 mmol/L (ref 3.5–5.1)
Sodium: 140 mmol/L (ref 135–145)
Total Bilirubin: 1 mg/dL (ref 0.3–1.2)
Total Protein: 8 g/dL (ref 6.5–8.1)

## 2021-01-13 LAB — CBC WITH DIFFERENTIAL/PLATELET
Abs Immature Granulocytes: 0.07 10*3/uL (ref 0.00–0.07)
Basophils Absolute: 0 10*3/uL (ref 0.0–0.1)
Basophils Relative: 0 %
Eosinophils Absolute: 0 10*3/uL (ref 0.0–0.5)
Eosinophils Relative: 0 %
HCT: 34.7 % — ABNORMAL LOW (ref 36.0–46.0)
Hemoglobin: 11.2 g/dL — ABNORMAL LOW (ref 12.0–15.0)
Immature Granulocytes: 1 %
Lymphocytes Relative: 4 %
Lymphs Abs: 0.5 10*3/uL — ABNORMAL LOW (ref 0.7–4.0)
MCH: 31.8 pg (ref 26.0–34.0)
MCHC: 32.3 g/dL (ref 30.0–36.0)
MCV: 98.6 fL (ref 80.0–100.0)
Monocytes Absolute: 0.7 10*3/uL (ref 0.1–1.0)
Monocytes Relative: 6 %
Neutro Abs: 10.3 10*3/uL — ABNORMAL HIGH (ref 1.7–7.7)
Neutrophils Relative %: 89 %
Platelets: 171 10*3/uL (ref 150–400)
RBC: 3.52 MIL/uL — ABNORMAL LOW (ref 3.87–5.11)
RDW: 15 % (ref 11.5–15.5)
WBC: 11.5 10*3/uL — ABNORMAL HIGH (ref 4.0–10.5)
nRBC: 0 % (ref 0.0–0.2)

## 2021-01-13 LAB — LIPASE, BLOOD: Lipase: 37 U/L (ref 11–51)

## 2021-01-13 MED ORDER — SODIUM CHLORIDE 0.9 % IV BOLUS
1000.0000 mL | Freq: Once | INTRAVENOUS | Status: AC
  Administered 2021-01-13: 1000 mL via INTRAVENOUS

## 2021-01-13 NOTE — ED Notes (Signed)
Family left; pt is poor historian.

## 2021-01-13 NOTE — ED Triage Notes (Signed)
Pt brought in by daughter for continued abd pain, nausea, and vomiting. Daughter states she has been seen multiple times at different facilities for same. Referred to GI but has been unable to get in to see anyone.

## 2021-01-13 NOTE — H&P (Signed)
History and Physical  Kathryn [name] YQM:[id number] DOB: [birth year] DOA: 01/13/2021  Referring physician: Karrie Meres, PA-C PCP: Suzan Slick, MD  Patient coming from: Home  Chief Complaint: Abdominal pain  HPI: Kathryn [name] is a 80 y.o. female with medical history significant for  hypertension, type 2 diabetes mellitus, GERD, positive ANA and SSA autoantibodies who presents to the emergency department due to more than 2 months history of decreased oral intake, generalized weakness and about 3 to 4-week onset of abdominal pain associated with difficulty in being able to swallow (solid and liquid).  She believes that she must have lost up to 30 pounds since onset of symptoms.  Patient endorsed that she usually regurgitates any food/liquid ingested within 15 to 20 minutes of ingestion.  Abdominal pain was in the epigastric area and was sharp in nature, this worsens with food.  She denies fever, chills, chest pain, shortness of breath. She was admitted to the hospital from 12/25/2020-12/28/2020, at that time barium p.o. esophagram done urgently as an outpatient was indicated.  She was also admitted from 10/23/2020 to 10/25/2020 during which patient presented with proximal muscle weakness with an elevated CPK of 1206, this was treated with prednisone with improvement.  EGD done on 10/04/2020 showed normal dilated esophagus and small hiatal hernia.  ED Course: In the emergency department, she was intermittently tachypneic and tachycardic.  Other vital signs were within normal range.  Work-up in the ED showed normocytic anemia and leukocytosis, BMP was normal except for elevated BUN at and hyperglycemia with CBG of 135.  Albumin 3.2. CT of head without contrast showed no acute intracranial abnormality. IV hydration was provided, hospitalist was asked to admit patient for further evaluation and management.   Review of Systems: Constitutional: Negative for chills and fever.  HENT: Negative for ear  pain and sore throat.   Eyes: Negative for pain and visual disturbance.  Respiratory: Negative for cough, chest tightness and shortness of breath.   Cardiovascular: Negative for chest pain and palpitations.  Gastrointestinal: Positive for abdominal pain, nausea and vomiting.  Endocrine: Negative for polyphagia and polyuria.  Genitourinary: Negative for decreased urine volume, dysuria, enuresis Musculoskeletal: Negative for arthralgias and back pain.  Skin: Negative for color change and rash.  Allergic/Immunologic: Negative for immunocompromised state.  Neurological: Positive for weakness.  Negative for tremors, syncope, speech difficulty Hematological: Does not bruise/bleed easily.  All other systems reviewed and are negative   Past Medical History:  Diagnosis Date   Acid reflux    Diabetes (HCC)    controlled with diet.    HTN (hypertension)    Past Surgical History:  Procedure Laterality Date   ABDOMINAL HYSTERECTOMY     CHOLECYSTECTOMY     ESOPHAGEAL DILATION N/A 10/24/2020   Procedure: ESOPHAGEAL DILATION;  Surgeon: Corbin Ade, MD;  Location: [facility];  Service: Endoscopy;  Laterality: N/A;   ESOPHAGOGASTRODUODENOSCOPY N/A 10/24/2020   Procedure: ESOPHAGOGASTRODUODENOSCOPY (EGD);  Surgeon: Corbin Ade, MD;  Location: [facility];  Service: Endoscopy;  Laterality: N/A;   Histosalpingogram      Social History:  reports that she has never smoked. She has never used smokeless tobacco. She reports that she does not drink alcohol and does not use drugs.   Allergies  Allergen Reactions   Cyclobenzaprine Hcl Swelling    Family History  Problem Relation Age of Onset   Cancer Other    Diabetes Other    Arthritis Other    Colon cancer Father  diagnosed in his late 51s.   Esophageal cancer Neg Hx    Stomach cancer Neg Hx      Prior to Admission medications   Medication Sig Start Date End Date Taking? Authorizing Provider  losartan (COZAAR) 50 MG  tablet Take 50 mg by mouth daily. 09/26/20  Yes [provider]  metoprolol tartrate (LOPRESSOR) 25 MG tablet Take 1 tablet (25 mg total) by mouth 2 (two) times daily. 10/25/20  Yes Erick Blinks, MD  metoprolol tartrate (LOPRESSOR) 25 MG tablet Take by mouth. 12/30/20  Yes [provider]  pantoprazole (PROTONIX) 40 MG tablet Take 40 mg by mouth daily.   Yes [provider]  triamcinolone ointment (KENALOG) 0.1 % Apply topically. 09/26/20  Yes [provider]  aspirin 81 MG tablet Take 81 mg by mouth daily. Patient not taking: No sig reported    [provider]    Physical Exam: BP (!) 141/74 (BP Location: Right Arm)   Pulse (!) 102   Temp 98.4 F (36.9 C) (Oral)   Resp 18   Ht 5\' 3"  (1.6 m)   Wt 54.4 kg   SpO2 100%   BMI 21.26 kg/m   General: 80 y.o. year-old female well developed well nourished in no acute distress.  Alert and oriented x3. HEENT: Dry mucous membrane.  NCAT, EOMI Neck: Supple, trachea medial Cardiovascular: Tachycardia.  Regular rate and rhythm with no rubs or gallops.  No thyromegaly or JVD noted.  2/4 pulses in all 4 extremities. Respiratory: Clear to auscultation with no wheezes or rales.  Abdomen: Soft, tender to palpation of epigastric area with no guarding.  Nondistended with normal bowel sounds x4 quadrants. Muskuloskeletal: No cyanosis, clubbing or edema noted bilaterally Neuro: CN II-XII intact, strength 5/5 x 4, sensation, reflexes intact Skin: No ulcerative lesions noted or rashes Psychiatry:  Mood is appropriate for condition and setting          Labs on Admission:  Basic Metabolic Panel: Recent Labs  Lab 01/13/21 2014  NA 140  K 3.6  CL 100  CO2 27  GLUCOSE 135*  BUN 30*  CREATININE 0.75  CALCIUM 9.3   Liver Function Tests: Recent Labs  Lab 01/13/21 2014  AST 27  ALT 15  ALKPHOS 54  BILITOT 1.0  PROT 8.0  ALBUMIN 3.2*   Recent Labs  Lab 01/13/21 2014  LIPASE 37   No results for  input(s): AMMONIA in the last 168 hours. CBC: Recent Labs  Lab 01/13/21 2014  WBC 11.5*  NEUTROABS 10.3*  HGB 11.2*  HCT 34.7*  MCV 98.6  PLT 171   Cardiac Enzymes: No results for input(s): CKTOTAL, CKMB, CKMBINDEX, TROPONINI in the last 168 hours.  BNP (last 3 results) No results for input(s): BNP in the last 8760 hours.  ProBNP (last 3 results) No results for input(s): PROBNP in the last 8760 hours.  CBG: No results for input(s): GLUCAP in the last 168 hours.  Radiological Exams on Admission: CT Head Wo Contrast  Result Date: 01/13/2021 CLINICAL DATA:  Delirium. EXAM: CT HEAD WITHOUT CONTRAST TECHNIQUE: Contiguous axial images were obtained from the base of the skull through the vertex without intravenous contrast. COMPARISON:  None. FINDINGS: Brain: No evidence of acute infarction, hemorrhage, hydrocephalus, extra-axial collection or mass lesion/mass effect. Vascular: No hyperdense vessel or unexpected calcification. Skull: Normal. Negative for fracture or focal lesion. Sinuses/Orbits: No acute finding. Other: None. IMPRESSION: No acute intracranial abnormality seen. Electronically Signed   By: 01/15/2021  M.D.   On: 01/13/2021 20:34    EKG: I independently viewed the EKG done and my findings are as followed: Sinus tachycardia at a rate of 107 bpm with QTc of 499 ms  Assessment/Plan Present on Admission:  Nausea and vomiting  HTN (hypertension)  Principal Problem:   Abdominal pain Active Problems:   HTN (hypertension)   Dysphagia   Nausea and vomiting   Generalized weakness   Dehydration   Normocytic anemia   Leukocytosis   Hyperglycemia   Hypoalbuminemia due to protein-calorie malnutrition (HCC)   Prolonged QT interval  Abdominal pain, nausea and vomiting in the setting of dysphagia Continue IV NS at 75 mLs/Hr Continue IV morphine 2 mg q.4h p.r.n. for moderate to severe pain Continue Protonix Continue IV p.r.n. Continue clear liquid diet with plan to  advanced diet as tolerated Gastroenterology was consulted during last admission and nonemergent barium pill esophagram was scheduled as an outpatient.  Gastroenterology will be consulted and we shall await further recommendation  Generalized weakness and dehydration in the setting of above Hypoalbuminemia secondary to mild protein calorie malnutrition Continue IV hydration as described above Continue protein supplement  Normocytic anemia Stable  Leukocytosis/hyperglycemia possibly reactive Continue to monitor WBC and blood glucose with morning labs  Prolonged QTc (499 ms) Avoid QT prolonging drugs Magnesium level will be checked Repeat EKG in the morning  COVID-19 infection (incidental finding) Patient is asymptomatic and stable on room air This was already diagnosed during last admission Continue airborne and contact precautions  Essential hypertension Continue Lopressor  Hyperglycemia secondary to T2DM No medication noted on med rec Hemoglobin A1c on 6/30-5.8  GERD Continue Protonix  DVT prophylaxis: SCDs  Code Status: Full code  Family Communication: None at bedside  Disposition Plan:  Patient is from:                        home Anticipated DC to:                   SNF or family members home Anticipated DC date:               2-3 days Anticipated DC barriers:         patient requires inpatient management due to symptom severity and pending gastroenterology consult  Consults called: Gastroenterology  Admission status: Inpatient    Frankey Shown MD [facility]  01/14/2021, 2:29 AM

## 2021-01-13 NOTE — ED Notes (Addendum)
Covid+ 2 days ago at [facility] but initially positive 6/30

## 2021-01-13 NOTE — ED Provider Notes (Signed)
[facility] EMERGENCY DEPARTMENT Provider Note   CSN: [id number] Arrival date & time: 01/13/21  1842     History Chief Complaint  Patient presents with   Abdominal Pain    Kathryn [name] is a 80 y.o. female.  HPI   Pt is an 80 y/o female with a h/o acid reflux, dm, htn, presenting for evaluation of decreased p.o. intake that has worsened over the last several weeks.  Patient daughter at bedside and assist with history.  Patient states that she has been to the ER several times over the last few months for evaluation of difficulty swallowing.  She is also had nausea and vomiting at home.  She has had some intermittent abdominal pain.  For the last few weeks she has lost a significant amount of weight and has become so weak that she is having difficulty caring for herself.  At this time she has been unable to even tolerate her own secretions.  She has not been able to tolerate liquids or solids at home  Past Medical History:  Diagnosis Date   Acid reflux    Diabetes (HCC)    controlled with diet.    HTN (hypertension)     Patient Active Problem List   Diagnosis Date Noted   Nausea and vomiting 01/13/2021   Colitis 12/25/2020   COVID-19 virus infection 12/25/2020   Loss of weight    Dysphagia    Early satiety    UTI (urinary tract infection) 10/23/2020   Elevated d-dimer 10/23/2020   Tachycardia 10/23/2020   Abnormal computed tomography of esophagus 10/23/2020   Mild protein-calorie malnutrition (HCC) 10/23/2020   Rhabdomyolysis 10/23/2020   HTN (hypertension) 10/23/2020   H/O rotator cuff syndrome 07/12/2012   Rotator cuff tendinitis 07/12/2012   SHOULDER PAIN 10/30/2007    Past Surgical History:  Procedure Laterality Date   ABDOMINAL HYSTERECTOMY     CHOLECYSTECTOMY     ESOPHAGEAL DILATION N/A 10/24/2020   Procedure: ESOPHAGEAL DILATION;  Surgeon: Corbin Ade, MD;  Location: [facility];  Service: Endoscopy;  Laterality: N/A;   ESOPHAGOGASTRODUODENOSCOPY N/A  10/24/2020   Procedure: ESOPHAGOGASTRODUODENOSCOPY (EGD);  Surgeon: Corbin Ade, MD;  Location: [facility];  Service: Endoscopy;  Laterality: N/A;   Histosalpingogram       OB History   No obstetric history on file.     Family History  Problem Relation Age of Onset   Cancer Other    Diabetes Other    Arthritis Other    Colon cancer Father        diagnosed in his late 13s.   Esophageal cancer Neg Hx    Stomach cancer Neg Hx     Social History   Tobacco Use   Smoking status: Never   Smokeless tobacco: Never  Vaping Use   Vaping Use: Never used  Substance Use Topics   Alcohol use: No   Drug use: No    Home Medications Prior to Admission medications   Medication Sig Start Date End Date Taking? Authorizing Provider  losartan (COZAAR) 50 MG tablet Take 50 mg by mouth daily. 09/26/20  Yes [provider]  metoprolol tartrate (LOPRESSOR) 25 MG tablet Take 1 tablet (25 mg total) by mouth 2 (two) times daily. 10/25/20  Yes Erick Blinks, MD  metoprolol tartrate (LOPRESSOR) 25 MG tablet Take by mouth. 12/30/20  Yes [provider]  pantoprazole (PROTONIX) 40 MG tablet Take 40 mg by mouth daily.   Yes [provider]  triamcinolone ointment (KENALOG) 0.1 % Apply topically. 09/26/20  Yes [provider]  aspirin 81 MG tablet Take 81 mg by mouth daily. Patient not taking: No sig reported    [provider]    Allergies    Cyclobenzaprine hcl  Review of Systems   Review of Systems  Constitutional:  Negative for fever.  HENT:  Negative for ear pain and sore throat.   Eyes:  Negative for visual disturbance.  Respiratory:  Negative for cough and shortness of breath.   Cardiovascular:  Negative for chest pain.  Gastrointestinal:  Positive for abdominal pain, nausea and vomiting. Negative for constipation and diarrhea.  Genitourinary:  Negative for dysuria and hematuria.  Musculoskeletal:  Negative for back pain.  Skin:  Negative for  color change and rash.  Neurological:  Positive for weakness.  All other systems reviewed and are negative.  Physical Exam Updated Vital Signs BP 127/77   Pulse (!) 111   Temp 97.9 F (36.6 C)   Resp (!) 28   Ht 5\' 3"  (1.6 m)   Wt 54.4 kg   SpO2 100%   BMI 21.26 kg/m   Physical Exam Vitals and nursing note reviewed.  Constitutional:      General: She is not in acute distress.    Appearance: She is well-developed.     Comments: Frail, cachectic  HENT:     Head: Normocephalic and atraumatic.     Mouth/Throat:     Mouth: Mucous membranes are dry.  Eyes:     Conjunctiva/sclera: Conjunctivae normal.  Cardiovascular:     Rate and Rhythm: Regular rhythm. Tachycardia present.     Heart sounds: No murmur heard. Pulmonary:     Effort: Pulmonary effort is normal. No respiratory distress.     Breath sounds: Rales (bibasilar) present.  Abdominal:     General: Bowel sounds are normal.     Palpations: Abdomen is soft.     Tenderness: There is no abdominal tenderness. There is no guarding or rebound.  Musculoskeletal:     Cervical back: Neck supple.  Skin:    General: Skin is warm and dry.  Neurological:     Mental Status: She is alert.    ED Results / Procedures / Treatments   Labs (all labs ordered are listed, but only abnormal results are displayed) Labs Reviewed  CBC WITH DIFFERENTIAL/PLATELET - Abnormal; Notable for the following components:      Result Value   WBC 11.5 (*)    RBC 3.52 (*)    Hemoglobin 11.2 (*)    HCT 34.7 (*)    Neutro Abs 10.3 (*)    Lymphs Abs 0.5 (*)    All other components within normal limits  COMPREHENSIVE METABOLIC PANEL - Abnormal; Notable for the following components:   Glucose, Bld 135 (*)    BUN 30 (*)    Albumin 3.2 (*)    All other components within normal limits  LIPASE, BLOOD  URINALYSIS, ROUTINE W REFLEX MICROSCOPIC    EKG None  Radiology CT Head Wo Contrast  Result Date: 01/13/2021 CLINICAL DATA:  Delirium. EXAM: CT  HEAD WITHOUT CONTRAST TECHNIQUE: Contiguous axial images were obtained from the base of the skull through the vertex without intravenous contrast. COMPARISON:  None. FINDINGS: Brain: No evidence of acute infarction, hemorrhage, hydrocephalus, extra-axial collection or mass lesion/mass effect. Vascular: No hyperdense vessel or unexpected calcification. Skull: Normal. Negative for fracture or focal lesion. Sinuses/Orbits: No acute finding. Other: None. IMPRESSION: No acute intracranial abnormality  seen. Electronically Signed   By: Lupita Raider M.D.   On: 01/13/2021 20:34    Procedures Procedures   Medications Ordered in ED Medications  sodium chloride 0.9 % bolus 1,000 mL (1,000 mLs Intravenous New Bag/Given 01/13/21 2219)    ED Course  I have reviewed the triage vital signs and the nursing notes.  Pertinent labs & imaging results that were available during my care of the patient were reviewed by me and considered in my medical decision making (see chart for details).    MDM Rules/Calculators/A&P                          80 year old female presenting for evaluation of decreased intake over the last few weeks to the point that she cannot tolerate solids or liquids.  Looks dehydrated on exam.  She is tachycardic  Reviewed/interpreted lab CBC reveals a mild leukocytosis, mild anemia CMP with elevated BUN Lipase negative  CT head was organized to rule out any central cause of her sialorrhea she has not had any stroke work-up at this time this was negative for any evidence of stroke.  She is had multiple CT scans do not feel that that needs to be repeated at this time.  Given her significant decline over the last several weeks and now inability to tolerate liquids or solids with findings of dehydration on exam and laboratory work feel she would benefit for admission for further hydration and expedited GI evaluation if felt to be appropriate by admitting team  10:46 PM CONSULT With Dr. Thomes Dinning  who accepts patient for admission  Final Clinical Impression(s) / ED Diagnoses Final diagnoses:  Dehydration    Rx / DC Orders ED Discharge Orders     None        Rayne Du 01/13/21 2314    Blane Ohara, MD 01/14/21 [phone]

## 2021-01-13 NOTE — Telephone Encounter (Signed)
Spoke to daughter.  She was made aware that we have cancellation with Dr. Jena Gauss on 02/17/2021 at 9:30 (first available).  She requested appt since it is sooner than her other one.  She said that pt is having trouble keeping anything down.  Throwing up everything she eats and drinks.  She went to [facility] ER on Sunday and they gave her fluids and sent her home.  Pt made aware to follow up with PCP or go to ER if worsening.  Routing to Dr. Jena Gauss to see if any additional recommendations.

## 2021-01-14 ENCOUNTER — Inpatient Hospital Stay (HOSPITAL_COMMUNITY): Payer: Medicare Other

## 2021-01-14 DIAGNOSIS — E440 Moderate protein-calorie malnutrition: Secondary | ICD-10-CM | POA: Insufficient documentation

## 2021-01-14 DIAGNOSIS — R1010 Upper abdominal pain, unspecified: Secondary | ICD-10-CM | POA: Diagnosis not present

## 2021-01-14 DIAGNOSIS — E8809 Other disorders of plasma-protein metabolism, not elsewhere classified: Secondary | ICD-10-CM

## 2021-01-14 DIAGNOSIS — D649 Anemia, unspecified: Secondary | ICD-10-CM

## 2021-01-14 DIAGNOSIS — D72829 Elevated white blood cell count, unspecified: Secondary | ICD-10-CM

## 2021-01-14 DIAGNOSIS — E860 Dehydration: Secondary | ICD-10-CM

## 2021-01-14 DIAGNOSIS — R109 Unspecified abdominal pain: Secondary | ICD-10-CM

## 2021-01-14 DIAGNOSIS — K220 Achalasia of cardia: Secondary | ICD-10-CM

## 2021-01-14 DIAGNOSIS — R9431 Abnormal electrocardiogram [ECG] [EKG]: Secondary | ICD-10-CM

## 2021-01-14 DIAGNOSIS — R739 Hyperglycemia, unspecified: Secondary | ICD-10-CM

## 2021-01-14 DIAGNOSIS — R1310 Dysphagia, unspecified: Secondary | ICD-10-CM | POA: Diagnosis not present

## 2021-01-14 DIAGNOSIS — R531 Weakness: Secondary | ICD-10-CM

## 2021-01-14 DIAGNOSIS — E46 Unspecified protein-calorie malnutrition: Secondary | ICD-10-CM

## 2021-01-14 LAB — COMPREHENSIVE METABOLIC PANEL
ALT: 14 U/L (ref 0–44)
AST: 23 U/L (ref 15–41)
Albumin: 3.1 g/dL — ABNORMAL LOW (ref 3.5–5.0)
Alkaline Phosphatase: 55 U/L (ref 38–126)
Anion gap: 10 (ref 5–15)
BUN: 28 mg/dL — ABNORMAL HIGH (ref 8–23)
CO2: 28 mmol/L (ref 22–32)
Calcium: 9.3 mg/dL (ref 8.9–10.3)
Chloride: 105 mmol/L (ref 98–111)
Creatinine, Ser: 0.64 mg/dL (ref 0.44–1.00)
GFR, Estimated: >60 mL/min (ref >60)
Glucose, Bld: 105 mg/dL — ABNORMAL HIGH (ref 70–99)
Potassium: 3.5 mmol/L (ref 3.5–5.1)
Sodium: 143 mmol/L (ref 135–145)
Total Bilirubin: 0.7 mg/dL (ref 0.3–1.2)
Total Protein: 7.7 g/dL (ref 6.5–8.1)

## 2021-01-14 LAB — CBC
HCT: 35.1 % — ABNORMAL LOW (ref 36.0–46.0)
Hemoglobin: 11.1 g/dL — ABNORMAL LOW (ref 12.0–15.0)
MCH: 31.5 pg (ref 26.0–34.0)
MCHC: 31.6 g/dL (ref 30.0–36.0)
MCV: 99.7 fL (ref 80.0–100.0)
Platelets: 166 10*3/uL (ref 150–400)
RBC: 3.52 MIL/uL — ABNORMAL LOW (ref 3.87–5.11)
RDW: 15.1 % (ref 11.5–15.5)
WBC: 10.2 10*3/uL (ref 4.0–10.5)
nRBC: 0 % (ref 0.0–0.2)

## 2021-01-14 LAB — APTT: aPTT: 28 seconds (ref 24–36)

## 2021-01-14 LAB — PROTIME-INR
INR: 1 (ref 0.8–1.2)
Prothrombin Time: 13 seconds (ref 11.4–15.2)

## 2021-01-14 MED ORDER — MORPHINE SULFATE (PF) 2 MG/ML IV SOLN
1.0000 mg | INTRAVENOUS | Status: DC | PRN
Start: 2021-01-14 — End: 2021-01-16
  Administered 2021-01-15: 1 mg via INTRAVENOUS
  Filled 2021-01-14: qty 1

## 2021-01-14 MED ORDER — SODIUM CHLORIDE 0.9 % IV SOLN
1.0000 g | INTRAVENOUS | Status: AC
  Administered 2021-01-14 – 2021-01-16 (×3): 1 g via INTRAVENOUS
  Filled 2021-01-14 (×4): qty 10

## 2021-01-14 MED ORDER — FOSFOMYCIN TROMETHAMINE 3 G PO PACK: 3.0000 g | PACK | Freq: Once | ORAL | Status: DC

## 2021-01-14 MED ORDER — SODIUM CHLORIDE 0.9 % IV SOLN: Freq: Once | INTRAVENOUS | Status: AC

## 2021-01-14 MED ORDER — SODIUM CHLORIDE 0.9 % IV SOLN: INTRAVENOUS | Status: DC

## 2021-01-14 MED ORDER — POTASSIUM CHLORIDE IN NACL 20-0.9 MEQ/L-% IV SOLN: INTRAVENOUS | Status: DC

## 2021-01-14 MED ORDER — MORPHINE SULFATE (PF) 2 MG/ML IV SOLN
2.0000 mg | INTRAVENOUS | Status: DC | PRN
Start: 2021-01-14 — End: 2021-01-14

## 2021-01-14 MED ORDER — PANTOPRAZOLE SODIUM 40 MG IV SOLR
40.0000 mg | INTRAVENOUS | Status: DC
  Administered 2021-01-14 – 2021-01-16 (×3): 40 mg via INTRAVENOUS
  Filled 2021-01-14 (×3): qty 40

## 2021-01-14 MED ORDER — PROCHLORPERAZINE EDISYLATE 10 MG/2ML IJ SOLN
10.0000 mg | Freq: Four times a day (QID) | INTRAMUSCULAR | Status: DC | PRN
  Administered 2021-02-06: 10 mg via INTRAVENOUS
  Filled 2021-01-14: qty 2

## 2021-01-14 NOTE — Consult Note (Signed)
Referring Provider: Cleora Fleet, MD Primary Care Physician:  Suzan Slick, MD Primary Gastroenterologist:  Roetta Sessions, MD  Reason for Consultation:  dysphagia, abdominal pain  HPI: Kathryn [name] is a 80 y.o. female with history of hypertension, diabetes, GERD, positive ANA and SSA autoantibodies, achalasia (abnormal manometry 2014) presenting to the emergency department with weakness and inability to eat.  Symptoms associated with upper abdominal pain, vomiting/regurgitation.     Two recent hospitalizations for the same, initially back in April and again earlier this month.  In April she also presented with proximal muscle weakness/pain, concern for polymyalgia rheumatica.  ANA panel was positive with positive SSA (Ro) antibody.  Noted improvement on steroids.  Follow-up with rheumatology planned.  She completed EGD at that time with fairly unremarkable esophagus.  She did undergo esophageal dilation given her history of dysphagia, patient states that did not help with her symptoms.  Patient reports it is becoming more difficult to keep any liquid or food down.  Within the 15 to 20 minutes after eating typically regurgitates thick foamy material.  Unable to swallow her own saliva, typically spits it out.  Also has some pp vomiting. After taking medications, she can taste them in her mouth/back of her throat.  She complains of epigastric pain.  Denies hematemesis.  Constipation worsened since she is unable to eat very much.  Denies any melena or rectal bleeding.  Patient reports 25 pound weight loss over the past several months.  Initial positive COVID test on June 30, she was asymptomatic.  She tested positive again at [facility] on July 17.  She has had no fever, cough, congestion, shortness of breath.  Her son also had COVID.  CT abdomen pelvis with contrast last admission (12/25/20) with mild diffuse wall thickening seen involving the cecum but otherwise unremarkable.  She was  asymptomatic at the time.  CT abdomen pelvis without contrast 12/04/20, bowel was normal.  In the ED, CT head without contrast unremarkable.  White blood cell count 11,500, hemoglobin 11.2, platelets 171,000, sodium 134, BUN 30, creatinine 0.75, albumin 3.2, LFTs otherwise normal.  Lipase 37.  Urinalysis with moderate leukocytes, positive nitrite, many bacteria and WBCs.  Hemoglobin stable at 11.1 today.  Currently on clear liquid diet.  Receiving IV pantoprazole every 24 hours and Rocephin.   Prior to Admission medications   Medication Sig Start Date End Date Taking? Authorizing Provider  losartan (COZAAR) 50 MG tablet Take 50 mg by mouth daily. 09/26/20  Yes [provider]  metoprolol tartrate (LOPRESSOR) 25 MG tablet Take 1 tablet (25 mg total) by mouth 2 (two) times daily. 10/25/20  Yes Erick Blinks, MD  metoprolol tartrate (LOPRESSOR) 25 MG tablet Take by mouth. 12/30/20  Yes [provider]  pantoprazole (PROTONIX) 40 MG tablet Take 40 mg by mouth daily.   Yes [provider]  triamcinolone ointment (KENALOG) 0.1 % Apply topically. 09/26/20  Yes [provider]  aspirin 81 MG tablet Take 81 mg by mouth daily. Patient not taking: No sig reported    [provider]    Current Facility-Administered Medications  Medication Dose Route Frequency Provider Last Rate Last Admin   0.9 % NaCl with KCl 20 mEq/ L  infusion   Intravenous Continuous Laural Benes, Clanford L, MD 50 mL/hr at 01/14/21 0811 New Bag at 01/14/21 0811   cefTRIAXone (ROCEPHIN) 1 g in sodium chloride 0.9 % 100 mL IVPB  1 g Intravenous Q24H Johnson, Clanford L, MD 200 mL/hr at  01/14/21 0815 1 g at 01/14/21 0815   morphine 2 MG/ML injection 1 mg  1 mg Intravenous Q4H PRN Johnson, Clanford L, MD       pantoprazole (PROTONIX) injection 40 mg  40 mg Intravenous Q24H Adefeso, Oladapo, DO   40 mg at 01/14/21 0359   prochlorperazine (COMPAZINE) injection 10 mg  10 mg Intravenous Q6H PRN Adefeso,  Oladapo, DO        Allergies as of 01/13/2021 - Review Complete 01/13/2021  Allergen Reaction Noted   Cyclobenzaprine hcl Swelling     Past Medical History:  Diagnosis Date   Acid reflux    Diabetes (HCC)    controlled with diet.    HTN (hypertension)     Past Surgical History:  Procedure Laterality Date   ABDOMINAL HYSTERECTOMY     CHOLECYSTECTOMY     ESOPHAGEAL DILATION N/A 10/24/2020   Procedure: ESOPHAGEAL DILATION;  Surgeon: Rourk, Robert M, MD;  Location: [facility];  Service: Endoscopy;  Laterality: N/A;   ESOPHAGOGASTRODUODENOSCOPY N/A 10/24/2020   Procedure: ESOPHAGOGASTRODUODENOSCOPY (EGD);  Surgeon: Rourk, Robert M, MD;  Location: [facility];  Service: Endoscopy;  Laterality: N/A;   Histosalpingogram      Family History  Problem Relation Age of Onset   Cancer Other    Diabetes Other    Arthritis Other    Colon cancer Father        diagnosed in his late 60s.   Esophageal cancer Neg Hx    Stomach cancer Neg Hx     Social History   Socioeconomic History   Marital status: Widowed    Spouse name: Not on file   Number of children: Not on file   Years of education: Not on file   Highest education level: Not on file  Occupational History   Not on file  Tobacco Use   Smoking status: Never   Smokeless tobacco: Never  Vaping Use   Vaping Use: Never used  Substance and Sexual Activity   Alcohol use: No   Drug use: No   Sexual activity: Never  Other Topics Concern   Not on file  Social History Narrative   Not on file   Social Determinants of Health   Financial Resource Strain: Not on file  Food Insecurity: Not on file  Transportation Needs: Not on file  Physical Activity: Not on file  Stress: Not on file  Social Connections: Not on file  Intimate Partner Violence: Not on file     ROS:  General: Negative for anorexia,   fever, chills, fatigue, weakness. See hpi Eyes: Negative for vision changes.  ENT: Negative for hoarseness,  nasal  congestion. See hpi CV: Negative for chest pain, angina, palpitations, dyspnea on exertion, peripheral edema.  Respiratory: Negative for dyspnea at rest, dyspnea on exertion, cough, sputum, wheezing.  GI: See history of present illness. GU:  Negative for dysuria, hematuria, urinary incontinence, urinary frequency, nocturnal urination.  MS: Negative for joint pain, low back pain.  Derm: Negative for rash or itching.  Neuro: Negative for weakness, abnormal sensation, seizure, frequent headaches, memory loss, confusion.  Psych: Negative for anxiety, depression, suicidal ideation, hallucinations.  Endo: see hpi Heme: Negative for bruising or bleeding. Allergy: Negative for rash or hives.       Physical Examination: Vital signs in last 24 hours: Temp:  [97.7 F (36.5 C)-98.4 F (36.9 C)] 97.7 F (36.5 C) (07/20 0341) Pulse Rate:  [102-124] 102 (07/20 0341) Resp:  [18-28] 18 (07/20 0341) BP: (  114-141)/(70-78) 137/78 (07/20 0341) SpO2:  [97 %-100 %] 100 % (07/20 0341) Weight:  [54.4 kg] 54.4 kg (07/19 1909)    General: Well-nourished, well-developed in no acute distress. Using suction to take care of her oral secretions Head: Normocephalic, atraumatic.   Eyes: Conjunctiva pink, no icterus. Mouth: Oropharyngeal mucosa moist and pink , no lesions erythema or exudate. Neck: Supple without thyromegaly, masses, or lymphadenopathy.  Lungs: Clear to auscultation bilaterally.  Heart: Regular rate and rhythm, no murmurs rubs or gallops.  Abdomen: Bowel sounds are normal,   nondistended, no hepatosplenomegaly or masses, no abdominal bruits or hernia, no rebound or guarding.  Epigastric tenderness Rectal: not performed Extremities: No lower extremity edema, clubbing, deformity.  Neuro: Alert and oriented x 4 , grossly normal neurologically.  Skin: Warm and dry, no rash or jaundice.   Psych: Alert and cooperative, normal mood and affect.        Intake/Output from previous day: No intake/output  data recorded. Intake/Output this shift: Total I/O In: 120 [P.O.:120] Out: -   Lab Results: CBC Recent Labs    01/13/21 2014 01/14/21 0409  WBC 11.5* 10.2  HGB 11.2* 11.1*  HCT 34.7* 35.1*  MCV 98.6 99.7  PLT 171 166   BMET Recent Labs    01/13/21 2014 01/14/21 0409  NA 140 143  K 3.6 3.5  CL 100 105  CO2 27 28  GLUCOSE 135* 105*  BUN 30* 28*  CREATININE 0.75 0.64  CALCIUM 9.3 9.3   LFT Recent Labs    01/13/21 2014 01/14/21 0409  BILITOT 1.0 0.7  ALKPHOS 54 55  AST 27 23  ALT 15 14  PROT 8.0 7.7  ALBUMIN 3.2* 3.1*    Lipase Recent Labs    01/13/21 2014  LIPASE 37    PT/INR Recent Labs    01/14/21 0409  LABPROT 13.0  INR 1.0      Imaging Studies: CT Head Wo Contrast  Result Date: 01/13/2021 CLINICAL DATA:  Delirium. EXAM: CT HEAD WITHOUT CONTRAST TECHNIQUE: Contiguous axial images were obtained from the base of the skull through the vertex without intravenous contrast. COMPARISON:  None. FINDINGS: Brain: No evidence of acute infarction, hemorrhage, hydrocephalus, extra-axial collection or mass lesion/mass effect. Vascular: No hyperdense vessel or unexpected calcification. Skull: Normal. Negative for fracture or focal lesion. Sinuses/Orbits: No acute finding. Other: None. IMPRESSION: No acute intracranial abnormality seen. Electronically Signed   By: Lupita Raider M.D.   On: 01/13/2021 20:34   CT ABDOMEN PELVIS W CONTRAST  Result Date: 12/25/2020 CLINICAL DATA:  Weakness, possible infection. EXAM: CT ABDOMEN AND PELVIS WITH CONTRAST TECHNIQUE: Multidetector CT imaging of the abdomen and pelvis was performed using the standard protocol following bolus administration of intravenous contrast. CONTRAST:  OMNIPAQUE IOHEXOL 300 MG/ML  SOLN COMPARISON:  December 11, 2020. FINDINGS: Lower chest: No acute abnormality. Hepatobiliary: No focal liver abnormality is seen. Status post cholecystectomy. No biliary dilatation. Pancreas: Unremarkable. No pancreatic  ductal dilatation or surrounding inflammatory changes. Spleen: Normal in size without focal abnormality. Adrenals/Urinary Tract: Adrenal glands appear normal. Left renal cyst is noted. No hydronephrosis or renal obstruction is noted. Urinary bladder is unremarkable. Stomach/Bowel: The stomach and appendix appear normal. There is no evidence of bowel obstruction. Mild diffuse wall thickening is seen involving the cecum concerning for infectious or inflammatory colitis. Vascular/Lymphatic: Aortic atherosclerosis. No enlarged abdominal or pelvic lymph nodes. Reproductive: Status post hysterectomy. No adnexal masses. Other: No abdominal wall hernia or abnormality. No abdominopelvic ascites. Musculoskeletal: No  acute or significant osseous findings. IMPRESSION: Mild diffuse wall thickening is seen involving the cecum concerning for infectious or inflammatory colitis. Aortic Atherosclerosis (ICD10-I70.0). Electronically Signed   By: Lupita Raider M.D.   On: 12/25/2020 17:38   DG Chest Port 1 View  Result Date: 12/25/2020 CLINICAL DATA:  Shortness of breath and cough. EXAM: PORTABLE CHEST 1 VIEW COMPARISON:  December 04, 2020 FINDINGS: The lungs are hyperinflated. There is no evidence of acute infiltrate, pleural effusion or pneumothorax. The heart size and mediastinal contours are within normal limits. There is marked severity calcification of the aortic arch. Multilevel degenerative changes seen throughout the thoracic spine. IMPRESSION: No active cardiopulmonary disease. Electronically Signed   By: Aram Candela M.D.   On: 12/25/2020 16:25  [4 week]   Impression: 80 year old female with ongoing diminished oral intake due to dysphagia, regurgitation, vomiting.  Personal history of abnormal manometry in 2014, diagnosed with achalasia.  Patient denies undergoing surgical management or Botox injections.  She had an EGD in April 2022 during hospitalization.  Esophagus appeared fairly unremarkable.  She did undergo  esophageal dilation but states that she had no noted improvement in her symptoms.  Symptoms have not progressed over the past couple of months, worse the last couple of weeks.  Within 15 to 20 minutes of trying to eat or drink anything, she regurgitates and/or vomits.  Suspected to have esophageal motility disorder as cause of the symptoms.    Also recently positive ANA and positive SSA (Ro) antibody.  Rheumatology consultation is pending.  Sjogren's syndrome could potentially be linked to this particular antibody. Unclear if playing a role in her GI symptoms at this time. Interestingly she was also treated with prednisone for possible polymyalgia rheumatica back in April.  Those symptoms have improved.  Patient initially tested positive for COVID December 25, 2020, was asymptomatic.  Tested again at [facility] on July 17, positive.  Remains asymptomatic.  Plan: To discuss with Dr. Karilyn Cota.  May benefit from EGD with Botox injection.  May or may not need barium pill esophagram prior to this.  Further recommendations to follow.  We would like to thank you for the opportunity to participate in the care of KYLIANA STANDEN.  Leanna Battles. Dixon Boos [facility] [phone] 7/20/202211:01 AM    LOS: 1 day

## 2021-01-14 NOTE — Progress Notes (Signed)
Initial Nutrition Assessment  DOCUMENTATION CODES:   Non-severe (moderate) malnutrition in context of chronic illness  INTERVENTION:  Follow diet advancement and make further recommendations   NUTRITION DIAGNOSIS:   Moderate Malnutrition related to chronic illness (achalasia) as evidenced by energy intake < or equal to 75% for > or equal to 1 month, mild fat depletion, mild muscle depletion.    GOAL:  Patient will meet greater than or equal to 90% of their needs   MONITOR:  Diet advancement, Supplement acceptance, PO intake, Weight trends  REASON FOR ASSESSMENT:   Malnutrition Screening Tool    ASSESSMENT: patient is a 80 yo female with history of DM-2, HTN, GERD and chronic achalasia. Patient presents with epigastric pain, weakness and dehydration.   Patient is currently NPO. Poor tolerance of oral intake per chart review. No family at bedside during RD visit. Expect pt meeting < 75% of energy needs the past month based on chart review. RD assessed pt on 7/1 and she was eating very little at that time. Question if pt a candidate for tube feeding if chronically unable to meet nutrition needs orally.   Weight loss reported per chart review. Patient weight 10/24/20 -56.7 kg (124.7 lb) and currently 54.4 kg (119.6 lb).    Medications reviewed and include:  protonix.  IVF-NS/ KCL @ 50 ml/hr.  Rocephin until 7/23  Labs: BMP Latest Ref Rng & Units 01/14/2021 01/13/2021 12/28/2020  Glucose 70 - 99 mg/dL 676(H) 209(O) 90  BUN 8 - 23 mg/dL 70(J) 62(E) 5(L)  Creatinine 0.44 - 1.00 mg/dL 3.66 2.94 7.65(Y)  Sodium 135 - 145 mmol/L 143 140 131(L)  Potassium 3.5 - 5.1 mmol/L 3.5 3.6 3.6  Chloride 98 - 111 mmol/L 105 100 103  CO2 22 - 32 mmol/L 28 27 22   Calcium 8.9 - 10.3 mg/dL 9.3 9.3 8.0(L)      NUTRITION - FOCUSED PHYSICAL EXAM: Nutrition-Focused physical exam completed. Findings are mild upper arm fat depletion, moderate clavicle and mild temporal muscle depletion, and no edema.      Diet Order:   Diet Order             Diet NPO time specified  Diet effective now                   EDUCATION NEEDS:  Not appropriate for education at this time  Skin:  Skin Assessment: Reviewed RN Assessment  Last BM:  Prior to admission  Height:   Ht Readings from Last 1 Encounters:  01/13/21 5\' 3"  (1.6 m)    Weight:   Wt Readings from Last 1 Encounters:  01/13/21 54.4 kg    Ideal Body Weight:   52 kg  BMI:  Body mass index is 21.26 kg/m.  Estimated Nutritional Needs:   Kcal:  1600-1795  Protein:  75-80 gr  Fluid:  1.6-1.7 liters daily  MS,RD,CSG,LDN Contact: 01/15/21

## 2021-01-14 NOTE — Progress Notes (Signed)
Palliative-   Thank you for this consult. Chart reviewed. Called patient's daughterWorthy Rancher to arrange a meeting time. She would like to find out what time patient's procedure is tomorrow and meet while she is having procedure. I will check tomorrow morning for approximate procedure time and try and meet with her then.   Ocie Bob, AGNP-C Palliative Medicine  No charge

## 2021-01-14 NOTE — Plan of Care (Signed)
  Problem: Education: Goal: Knowledge of General Education information will improve Description: Including pain rating scale, medication(s)/side effects and non-pharmacologic comfort measures Outcome: Progressing   Problem: Health Behavior/Discharge Planning: Goal: Ability to manage health-related needs will improve Outcome: Progressing   Problem: Clinical Measurements: Goal: Ability to maintain clinical measurements within normal limits will improve Outcome: Progressing Goal: Will remain free from infection Outcome: Progressing Goal: Diagnostic test results will improve Outcome: Progressing   Problem: Activity: Goal: Risk for activity intolerance will decrease Outcome: Progressing   Problem: Nutrition: Goal: Adequate nutrition will be maintained Outcome: Not Progressing   Problem: Pain Managment: Goal: General experience of comfort will improve Outcome: Progressing   Problem: Safety: Goal: Ability to remain free from injury will improve Outcome: Progressing   Problem: Skin Integrity: Goal: Risk for impaired skin integrity will decrease Outcome: Progressing

## 2021-01-14 NOTE — Progress Notes (Signed)
PROGRESS NOTE    Kathryn [name]  UYQ:[id number] DOB: [birth year] DOA: 01/13/2021 PCP: Suzan Slick, MD  Outpatient Specialists: Dr. Jena Gauss (GI)    Brief Narrative:   Kathryn [name] is a 80 y.o. female with medical history significant for  hypertension, type 2 diabetes mellitus, GERD, positive ANA and SSA autoantibodies, and chronic achalasia (2014 manometry showed abnormal motility)  who presents to the emergency department due to more than 2 months history of decreased oral intake, generalized weakness and about 3 to 4-week onset of abdominal pain associated with difficulty in being able to swallow (solid and liquid).  She believes that she must have lost up to 30 pounds since onset of symptoms.  Patient endorsed that she usually regurgitates any food/liquid ingested within 15 to 20 minutes of ingestion.  Abdominal pain was in the epigastric area and was sharp in nature, this worsens with food.  She denies fever, chills, chest pain, shortness of breath.   She was admitted to the hospital from 12/25/2020-12/28/2020, at that time barium p.o. esophagram done urgently as an outpatient was indicated.  She was also admitted from 10/23/2020 to 10/25/2020 during which patient presented with proximal muscle weakness with an elevated CPK of 1206 that was concerning for polymyalgia rheumatica. This was treated with prednisone with improvement.  EGD done on 10/04/2020 showed normal dilated esophagus and small hiatal hernia.  In the emergency department, she was intermittently tachypneic and tachycardic.  Other vital signs were within normal range.  Work-up in the ED showed normocytic anemia and leukocytosis, BMP was normal except for elevated BUN at and hyperglycemia with CBG of 135.  Albumin 3.2. CT of head without contrast showed no acute intracranial abnormality. IV hydration was provided, hospitalist was asked to admit patient for further evaluation and management.  7/20 - Patient still experiences  significant epigastric pain. Using suction for oral secretion. No other acute complaints. GI (Dr. Karilyn Cota) was consulted and will provide further recommendations. Of note, patient has not been able to follow up with outpatient rheumatology and has not been placed on any anti-inflammatories.   Assessment & Plan:   Principal Problem:   Abdominal pain Active Problems:   HTN (hypertension)   Dysphagia   Nausea and vomiting   Generalized weakness   Dehydration   Normocytic anemia   Leukocytosis   Hyperglycemia   Hypoalbuminemia due to protein-calorie malnutrition (HCC)   Prolonged QT interval   Achalasia  Epigastric/Abdominal pain, nausea and vomiting in the setting of esophageal dysphagia Patient is ANA/SSA antibody positive. Sjogren's syndrome and SLE are on the differential. Patient also has had increased oral secretion, suggestive of sicca-related symptoms. Gastrointestinal involvement of SLE can manifest as esophageal motility disorder, such as achalasia, which has been suggesting in this patient. Other GI manifestations of SLE include acute pancreatitis, for which  patient signs and symptoms are concerning, but CT 6/30 has shown no acute pancreatic findings. EGD in April 2022 showed unremarkable esophagus, and patient underwent dialtion given history of dysphagia, but patient states that did not help with symptoms.   -Continue IV NS at 75 mLs/Hr -Continue IV morphine 2 mg q.4h p.r.n. for moderate to severe pain -Continue Protonix -Continue IV medications p.r.n. -Continue clear liquid diet with plan to advanced diet as tolerated -Gastroenterology was consulted during last admission and nonemergent barium pill esophagram was scheduled as an outpatient. Gastroenterology (Dr. Karilyn Cota) to evaluate patient with recommendations pending. - F/u barium swallow test   Generalized weakness and dehydration in the setting  of above -Hypoalbuminemia secondary to mild protein calorie  malnutrition -Continue IV hydration as described above -Continue protein supplement  Urinary tract infection  - UA in the ED showed many bacteria, moderate ketones, nitrites -Patient is on ceftriaxone 1g for coverage (to be d/c 7/23)  Normocytic anemia -Chronic and Stable  Leukocytosis/hyperglycemia possibly reactive - WBC normalized to 10 this AM - Continue to monitor WBC and blood glucose with morning labs   Prolonged QTc (499 ms) Avoid QT prolonging drugs Magnesium 1.9 - Follow up on EKG this AM   COVID-19 infection (incidental finding) -Patient is asymptomatic and stable on room air -This was already diagnosed during last admission -Continue airborne and contact precautions   Essential hypertension -Continue Lopressor  Hyperglycemia secondary to T2DM -No medication noted on med rec -Hemoglobin A1c on 6/30-5.8   GERD -Continue Protonix   DVT prophylaxis: SCD Code Status: Full Family Communication: Phone contact to daughter Kathryn [name] (who was at bedside) Disposition Plan: SNF or family member's home  Consultants:  Gastroenterology (Dr. Jena Gauss) Palliative care  Procedures:  Barium swallow 7/20   Antimicrobials: Anti-infectives (From admission, onward)    Start     Dose/Rate Route Frequency Ordered Stop   01/14/21 0830  cefTRIAXone (ROCEPHIN) 1 g in sodium chloride 0.9 % 100 mL IVPB        1 g 200 mL/hr over 30 Minutes Intravenous Every 24 hours 01/14/21 0735 01/17/21 0829   01/14/21 0800  fosfomycin (MONUROL) packet 3 g  Status:  Discontinued        3 g Oral  Once 01/14/21 2751 01/14/21 0734      Subjective: Patient still continues to complain of epigastric pain and dysphagia. Otherwise, no acute GI complaints including nausea, diarrhea, constipation, melena/hematochezia.   Patient is using suction for oral secretions.   Objective: Vitals:   01/13/21 2200 01/13/21 2330 01/14/21 0034 01/14/21 0341  BP: 119/71 135/72 (!) 141/74 137/78  Pulse: (!) 105  (!) 103 (!) 102 (!) 102  Resp: (!) 21 20 18 18   Temp:   98.4 F (36.9 C) 97.7 F (36.5 C)  TempSrc:   Oral Oral  SpO2: 98% 97% 100% 100%  Weight:      Height:        Intake/Output Summary (Last 24 hours) at 01/14/2021 1132 Last data filed at 01/14/2021 0900 Gross per 24 hour  Intake 120 ml  Output --  Net 120 ml   Filed Weights   01/13/21 1909  Weight: 54.4 kg    Examination:  General exam: Well developed female adult lying in bed, using suction for oral secretion Respiratory system: Clear to auscultation. Respiratory effort normal. Cardiovascular system: s1/s2, rrr, no m/r/g, no S3 Gastrointestinal system: Abdomen is soft, diffuse tenderness to palpation, no distension. +bowel sound Central nervous system: Alert and awake.  Extremities: No LE edema Skin: No new rash, warm and moist Psychiatry: Judgement and insight appear normal. Mood & affect full range  Data Reviewed: I have personally reviewed following labs and imaging studies  CBC: Recent Labs  Lab 01/13/21 2014 01/14/21 0409  WBC 11.5* 10.2  NEUTROABS 10.3*  --   HGB 11.2* 11.1*  HCT 34.7* 35.1*  MCV 98.6 99.7  PLT 171 166   Basic Metabolic Panel: Recent Labs  Lab 01/13/21 2014 01/14/21 0409  NA 140 143  K 3.6 3.5  CL 100 105  CO2 27 28  GLUCOSE 135* 105*  BUN 30* 28*  CREATININE 0.75 0.64  CALCIUM 9.3 9.3  GFR: Estimated Creatinine Clearance: 45.6 mL/min (by C-G formula based on SCr of 0.64 mg/dL). Liver Function Tests: Recent Labs  Lab 01/13/21 2014 01/14/21 0409  AST 27 23  ALT 15 14  ALKPHOS 54 55  BILITOT 1.0 0.7  PROT 8.0 7.7  ALBUMIN 3.2* 3.1*   Recent Labs  Lab 01/13/21 2014  LIPASE 37   No results for input(s): AMMONIA in the last 168 hours. Coagulation Profile: Recent Labs  Lab 01/14/21 0409  INR 1.0   Cardiac Enzymes: No results for input(s): CKTOTAL, CKMB, CKMBINDEX, TROPONINI in the last 168 hours. BNP (last 3 results) No results for input(s): PROBNP in the  last 8760 hours. HbA1C: No results for input(s): HGBA1C in the last 72 hours. CBG: No results for input(s): GLUCAP in the last 168 hours. Lipid Profile: No results for input(s): CHOL, HDL, LDLCALC, TRIG, CHOLHDL, LDLDIRECT in the last 72 hours. Thyroid Function Tests: No results for input(s): TSH, T4TOTAL, FREET4, T3FREE, THYROIDAB in the last 72 hours. Anemia Panel: No results for input(s): VITAMINB12, FOLATE, FERRITIN, TIBC, IRON, RETICCTPCT in the last 72 hours. Urine analysis:    Component Value Date/Time   COLORURINE AMBER (A) 01/13/2021 2309   APPEARANCEUR CLOUDY (A) 01/13/2021 2309   LABSPEC 1.025 01/13/2021 2309   PHURINE 5.0 01/13/2021 2309   GLUCOSEU NEGATIVE 01/13/2021 2309   HGBUR SMALL (A) 01/13/2021 2309   BILIRUBINUR NEGATIVE 01/13/2021 2309   KETONESUR 20 (A) 01/13/2021 2309   PROTEINUR 100 (A) 01/13/2021 2309   NITRITE POSITIVE (A) 01/13/2021 2309   LEUKOCYTESUR MODERATE (A) 01/13/2021 2309   )No results found for this or any previous visit (from the past 240 hour(s)).   Radiology Studies: CT Head Wo Contrast  Result Date: 01/13/2021 CLINICAL DATA:  Delirium. EXAM: CT HEAD WITHOUT CONTRAST TECHNIQUE: Contiguous axial images were obtained from the base of the skull through the vertex without intravenous contrast. COMPARISON:  None. FINDINGS: Brain: No evidence of acute infarction, hemorrhage, hydrocephalus, extra-axial collection or mass lesion/mass effect. Vascular: No hyperdense vessel or unexpected calcification. Skull: Normal. Negative for fracture or focal lesion. Sinuses/Orbits: No acute finding. Other: None. IMPRESSION: No acute intracranial abnormality seen. Electronically Signed   By: Lupita Raider M.D.   On: 01/13/2021 20:34    Scheduled Meds:  pantoprazole (PROTONIX) IV  40 mg Intravenous Q24H   Continuous Infusions:  0.9 % NaCl with KCl 20 mEq / L 50 mL/hr at 01/14/21 0811   cefTRIAXone (ROCEPHIN)  IV 1 g (01/14/21 0815)    LOS: 1 day   Time  spent: 30 minutes  Kathryn [name], Medical student [facility] Pager [phone]  If 7PM-7AM, please contact night-coverage [email or web address] Password [facility] 01/14/2021, 11:32 AM   ATTENDING NOTE  Patient seen and examined with Kathryn [name], Medical student. In addition to supervising the encounter, I played a key role in the decision making process as well as reviewed key findings.   Standley Dakins MD How to contact the [facility] Attending or Consulting provider 7A - 7P or covering provider during after hours 7P -7A, for this patient?  Check the care team in [facility] and look for a) attending/consulting [facility] provider listed and b) the [facility] team listed Log into [email or web address] and use [facility]'s universal password to access. If you do not have the password, please contact the hospital operator. Locate the [place] provider you are looking for under [facility] and page to a number that you can be directly reached. If you still have difficulty reaching the  provider, please page the [facility] (Director on Call) for the Hospitalists listed on amion for assistance.

## 2021-01-14 NOTE — Telephone Encounter (Signed)
Misty Stanley and Darl Pikes:   Can you let pt know if any cancellations?  See note below from Dr. Jena Gauss.      Rourk, Gerrit Friends, MD  You 3 hours ago (7:34 AM)    To the office ASAP

## 2021-01-14 NOTE — H&P (View-Only) (Signed)
Referring Provider: Cleora Fleet, MD Primary Care Physician:  Suzan Slick, MD Primary Gastroenterologist:  Roetta Sessions, MD  Reason for Consultation:  dysphagia, abdominal pain  HPI: Kathryn [name] is a 80 y.o. female with history of hypertension, diabetes, GERD, positive ANA and SSA autoantibodies, achalasia (abnormal manometry 2014) presenting to the emergency department with weakness and inability to eat.  Symptoms associated with upper abdominal pain, vomiting/regurgitation.     Two recent hospitalizations for the same, initially back in April and again earlier this month.  In April she also presented with proximal muscle weakness/pain, concern for polymyalgia rheumatica.  ANA panel was positive with positive SSA (Ro) antibody.  Noted improvement on steroids.  Follow-up with rheumatology planned.  She completed EGD at that time with fairly unremarkable esophagus.  She did undergo esophageal dilation given her history of dysphagia, patient states that did not help with her symptoms.  Patient reports it is becoming more difficult to keep any liquid or food down.  Within the 15 to 20 minutes after eating typically regurgitates thick foamy material.  Unable to swallow her own saliva, typically spits it out.  Also has some pp vomiting. After taking medications, she can taste them in her mouth/back of her throat.  She complains of epigastric pain.  Denies hematemesis.  Constipation worsened since she is unable to eat very much.  Denies any melena or rectal bleeding.  Patient reports 25 pound weight loss over the past several months.  Initial positive COVID test on June 30, she was asymptomatic.  She tested positive again at [facility] on July 17.  She has had no fever, cough, congestion, shortness of breath.  Her son also had COVID.  CT abdomen pelvis with contrast last admission (12/25/20) with mild diffuse wall thickening seen involving the cecum but otherwise unremarkable.  She was  asymptomatic at the time.  CT abdomen pelvis without contrast 12/04/20, bowel was normal.  In the ED, CT head without contrast unremarkable.  White blood cell count 11,500, hemoglobin 11.2, platelets 171,000, sodium 134, BUN 30, creatinine 0.75, albumin 3.2, LFTs otherwise normal.  Lipase 37.  Urinalysis with moderate leukocytes, positive nitrite, many bacteria and WBCs.  Hemoglobin stable at 11.1 today.  Currently on clear liquid diet.  Receiving IV pantoprazole every 24 hours and Rocephin.   Prior to Admission medications   Medication Sig Start Date End Date Taking? Authorizing Provider  losartan (COZAAR) 50 MG tablet Take 50 mg by mouth daily. 09/26/20  Yes [provider]  metoprolol tartrate (LOPRESSOR) 25 MG tablet Take 1 tablet (25 mg total) by mouth 2 (two) times daily. 10/25/20  Yes Erick Blinks, MD  metoprolol tartrate (LOPRESSOR) 25 MG tablet Take by mouth. 12/30/20  Yes [provider]  pantoprazole (PROTONIX) 40 MG tablet Take 40 mg by mouth daily.   Yes [provider]  triamcinolone ointment (KENALOG) 0.1 % Apply topically. 09/26/20  Yes [provider]  aspirin 81 MG tablet Take 81 mg by mouth daily. Patient not taking: No sig reported    [provider]    Current Facility-Administered Medications  Medication Dose Route Frequency Provider Last Rate Last Admin   0.9 % NaCl with KCl 20 mEq/ L  infusion   Intravenous Continuous Laural Benes, Clanford L, MD 50 mL/hr at 01/14/21 0811 New Bag at 01/14/21 0811   cefTRIAXone (ROCEPHIN) 1 g in sodium chloride 0.9 % 100 mL IVPB  1 g Intravenous Q24H Johnson, Clanford L, MD 200 mL/hr at  01/14/21 0815 1 g at 01/14/21 0815   morphine 2 MG/ML injection 1 mg  1 mg Intravenous Q4H PRN Johnson, Clanford L, MD       pantoprazole (PROTONIX) injection 40 mg  40 mg Intravenous Q24H Adefeso, Oladapo, DO   40 mg at 01/14/21 0359   prochlorperazine (COMPAZINE) injection 10 mg  10 mg Intravenous Q6H PRN Adefeso,  Oladapo, DO        Allergies as of 01/13/2021 - Review Complete 01/13/2021  Allergen Reaction Noted   Cyclobenzaprine hcl Swelling     Past Medical History:  Diagnosis Date   Acid reflux    Diabetes (HCC)    controlled with diet.    HTN (hypertension)     Past Surgical History:  Procedure Laterality Date   ABDOMINAL HYSTERECTOMY     CHOLECYSTECTOMY     ESOPHAGEAL DILATION N/A 10/24/2020   Procedure: ESOPHAGEAL DILATION;  Surgeon: Corbin Adeourk, Robert M, MD;  Location: [facility];  Service: Endoscopy;  Laterality: N/A;   ESOPHAGOGASTRODUODENOSCOPY N/A 10/24/2020   Procedure: ESOPHAGOGASTRODUODENOSCOPY (EGD);  Surgeon: Corbin Adeourk, Robert M, MD;  Location: [facility];  Service: Endoscopy;  Laterality: N/A;   Histosalpingogram      Family History  Problem Relation Age of Onset   Cancer Other    Diabetes Other    Arthritis Other    Colon cancer Father        diagnosed in his late 2660s.   Esophageal cancer Neg Hx    Stomach cancer Neg Hx     Social History   Socioeconomic History   Marital status: Widowed    Spouse name: Not on file   Number of children: Not on file   Years of education: Not on file   Highest education level: Not on file  Occupational History   Not on file  Tobacco Use   Smoking status: Never   Smokeless tobacco: Never  Vaping Use   Vaping Use: Never used  Substance and Sexual Activity   Alcohol use: No   Drug use: No   Sexual activity: Never  Other Topics Concern   Not on file  Social History Narrative   Not on file   Social Determinants of Health   Financial Resource Strain: Not on file  Food Insecurity: Not on file  Transportation Needs: Not on file  Physical Activity: Not on file  Stress: Not on file  Social Connections: Not on file  Intimate Partner Violence: Not on file     ROS:  General: Negative for anorexia,   fever, chills, fatigue, weakness. See hpi Eyes: Negative for vision changes.  ENT: Negative for hoarseness,  nasal  congestion. See hpi CV: Negative for chest pain, angina, palpitations, dyspnea on exertion, peripheral edema.  Respiratory: Negative for dyspnea at rest, dyspnea on exertion, cough, sputum, wheezing.  GI: See history of present illness. GU:  Negative for dysuria, hematuria, urinary incontinence, urinary frequency, nocturnal urination.  MS: Negative for joint pain, low back pain.  Derm: Negative for rash or itching.  Neuro: Negative for weakness, abnormal sensation, seizure, frequent headaches, memory loss, confusion.  Psych: Negative for anxiety, depression, suicidal ideation, hallucinations.  Endo: see hpi Heme: Negative for bruising or bleeding. Allergy: Negative for rash or hives.       Physical Examination: Vital signs in last 24 hours: Temp:  [97.7 F (36.5 C)-98.4 F (36.9 C)] 97.7 F (36.5 C) (07/20 0341) Pulse Rate:  [102-124] 102 (07/20 0341) Resp:  [18-28] 18 (07/20 0341) BP: (  114-141)/(70-78) 137/78 (07/20 0341) SpO2:  [97 %-100 %] 100 % (07/20 0341) Weight:  [54.4 kg] 54.4 kg (07/19 1909)    General: Well-nourished, well-developed in no acute distress. Using suction to take care of her oral secretions Head: Normocephalic, atraumatic.   Eyes: Conjunctiva pink, no icterus. Mouth: Oropharyngeal mucosa moist and pink , no lesions erythema or exudate. Neck: Supple without thyromegaly, masses, or lymphadenopathy.  Lungs: Clear to auscultation bilaterally.  Heart: Regular rate and rhythm, no murmurs rubs or gallops.  Abdomen: Bowel sounds are normal,   nondistended, no hepatosplenomegaly or masses, no abdominal bruits or hernia, no rebound or guarding.  Epigastric tenderness Rectal: not performed Extremities: No lower extremity edema, clubbing, deformity.  Neuro: Alert and oriented x 4 , grossly normal neurologically.  Skin: Warm and dry, no rash or jaundice.   Psych: Alert and cooperative, normal mood and affect.        Intake/Output from previous day: No intake/output  data recorded. Intake/Output this shift: Total I/O In: 120 [P.O.:120] Out: -   Lab Results: CBC Recent Labs    01/13/21 2014 01/14/21 0409  WBC 11.5* 10.2  HGB 11.2* 11.1*  HCT 34.7* 35.1*  MCV 98.6 99.7  PLT 171 166   BMET Recent Labs    01/13/21 2014 01/14/21 0409  NA 140 143  K 3.6 3.5  CL 100 105  CO2 27 28  GLUCOSE 135* 105*  BUN 30* 28*  CREATININE 0.75 0.64  CALCIUM 9.3 9.3   LFT Recent Labs    01/13/21 2014 01/14/21 0409  BILITOT 1.0 0.7  ALKPHOS 54 55  AST 27 23  ALT 15 14  PROT 8.0 7.7  ALBUMIN 3.2* 3.1*    Lipase Recent Labs    01/13/21 2014  LIPASE 37    PT/INR Recent Labs    01/14/21 0409  LABPROT 13.0  INR 1.0      Imaging Studies: CT Head Wo Contrast  Result Date: 01/13/2021 CLINICAL DATA:  Delirium. EXAM: CT HEAD WITHOUT CONTRAST TECHNIQUE: Contiguous axial images were obtained from the base of the skull through the vertex without intravenous contrast. COMPARISON:  None. FINDINGS: Brain: No evidence of acute infarction, hemorrhage, hydrocephalus, extra-axial collection or mass lesion/mass effect. Vascular: No hyperdense vessel or unexpected calcification. Skull: Normal. Negative for fracture or focal lesion. Sinuses/Orbits: No acute finding. Other: None. IMPRESSION: No acute intracranial abnormality seen. Electronically Signed   By: Lupita Raider M.D.   On: 01/13/2021 20:34   CT ABDOMEN PELVIS W CONTRAST  Result Date: 12/25/2020 CLINICAL DATA:  Weakness, possible infection. EXAM: CT ABDOMEN AND PELVIS WITH CONTRAST TECHNIQUE: Multidetector CT imaging of the abdomen and pelvis was performed using the standard protocol following bolus administration of intravenous contrast. CONTRAST:  OMNIPAQUE IOHEXOL 300 MG/ML  SOLN COMPARISON:  December 11, 2020. FINDINGS: Lower chest: No acute abnormality. Hepatobiliary: No focal liver abnormality is seen. Status post cholecystectomy. No biliary dilatation. Pancreas: Unremarkable. No pancreatic  ductal dilatation or surrounding inflammatory changes. Spleen: Normal in size without focal abnormality. Adrenals/Urinary Tract: Adrenal glands appear normal. Left renal cyst is noted. No hydronephrosis or renal obstruction is noted. Urinary bladder is unremarkable. Stomach/Bowel: The stomach and appendix appear normal. There is no evidence of bowel obstruction. Mild diffuse wall thickening is seen involving the cecum concerning for infectious or inflammatory colitis. Vascular/Lymphatic: Aortic atherosclerosis. No enlarged abdominal or pelvic lymph nodes. Reproductive: Status post hysterectomy. No adnexal masses. Other: No abdominal wall hernia or abnormality. No abdominopelvic ascites. Musculoskeletal: No  acute or significant osseous findings. IMPRESSION: Mild diffuse wall thickening is seen involving the cecum concerning for infectious or inflammatory colitis. Aortic Atherosclerosis (ICD10-I70.0). Electronically Signed   By: Lupita Raider M.D.   On: 12/25/2020 17:38   DG Chest Port 1 View  Result Date: 12/25/2020 CLINICAL DATA:  Shortness of breath and cough. EXAM: PORTABLE CHEST 1 VIEW COMPARISON:  December 04, 2020 FINDINGS: The lungs are hyperinflated. There is no evidence of acute infiltrate, pleural effusion or pneumothorax. The heart size and mediastinal contours are within normal limits. There is marked severity calcification of the aortic arch. Multilevel degenerative changes seen throughout the thoracic spine. IMPRESSION: No active cardiopulmonary disease. Electronically Signed   By: Aram Candela M.D.   On: 12/25/2020 16:25  [4 week]   Impression: 80 year old female with ongoing diminished oral intake due to dysphagia, regurgitation, vomiting.  Personal history of abnormal manometry in 2014, diagnosed with achalasia.  Patient denies undergoing surgical management or Botox injections.  She had an EGD in April 2022 during hospitalization.  Esophagus appeared fairly unremarkable.  She did undergo  esophageal dilation but states that she had no noted improvement in her symptoms.  Symptoms have not progressed over the past couple of months, worse the last couple of weeks.  Within 15 to 20 minutes of trying to eat or drink anything, she regurgitates and/or vomits.  Suspected to have esophageal motility disorder as cause of the symptoms.    Also recently positive ANA and positive SSA (Ro) antibody.  Rheumatology consultation is pending.  Sjogren's syndrome could potentially be linked to this particular antibody. Unclear if playing a role in her GI symptoms at this time. Interestingly she was also treated with prednisone for possible polymyalgia rheumatica back in April.  Those symptoms have improved.  Patient initially tested positive for COVID December 25, 2020, was asymptomatic.  Tested again at [facility] on July 17, positive.  Remains asymptomatic.  Plan: To discuss with Dr. Karilyn Cota.  May benefit from EGD with Botox injection.  May or may not need barium pill esophagram prior to this.  Further recommendations to follow.  We would like to thank you for the opportunity to participate in the care of Kathryn [name].  Leanna Battles. Dixon Boos [facility] [phone] 7/20/202211:01 AM    LOS: 1 day

## 2021-01-14 NOTE — Evaluation (Signed)
Clinical/Bedside Swallow Evaluation Patient Details  Name: Kathryn [name] MRN: [id number] Date of Birth: [birth year]  Today's Date: 01/14/2021 Time: SLP Start Time (ACUTE ONLY): 1404 SLP Stop Time (ACUTE ONLY): 1425 SLP Time Calculation (min) (ACUTE ONLY): 21 min  Past Medical History:  Past Medical History:  Diagnosis Date   Acid reflux    Diabetes (HCC)    controlled with diet.    HTN (hypertension)    Past Surgical History:  Past Surgical History:  Procedure Laterality Date   ABDOMINAL HYSTERECTOMY     CHOLECYSTECTOMY     ESOPHAGEAL DILATION N/A 10/24/2020   Procedure: ESOPHAGEAL DILATION;  Surgeon: Corbin Ade, MD;  [facility]: [facility];  Service: Endoscopy;  Laterality: N/A;   ESOPHAGOGASTRODUODENOSCOPY N/A 10/24/2020   Procedure: ESOPHAGOGASTRODUODENOSCOPY (EGD);  Surgeon: Corbin Ade, MD;  Location: [facility];  Service: Endoscopy;  Laterality: N/A;   Histosalpingogram     HPI:  Kathryn [name] is a 80 y.o. female with medical history significant for  hypertension, type 2 diabetes mellitus, GERD, positive ANA and SSA autoantibodies who presents to the emergency department due to more than 2 months history of decreased oral intake, generalized weakness and about 3 to 4-week onset of abdominal pain associated with difficulty in being able to swallow (solid and liquid).  She believes that she must have lost up to 30 pounds since onset of symptoms.  Patient endorsed that she usually regurgitates any food/liquid ingested within 15 to 20 minutes of ingestion.  Abdominal pain was in the epigastric area and was sharp in nature, this worsens with food.  She denies fever, chills, chest pain, shortness of breath.  She was admitted to the hospital from 12/25/2020-12/28/2020, at that time barium p.o. esophagram done urgently as an outpatient was indicated.  She was also admitted from 10/23/2020 to 10/25/2020 during which patient presented with proximal muscle weakness with an elevated CPK  of 1206, this was treated with prednisone with improvement.  EGD done on 10/04/2020 showed normal dilated esophagus and small hiatal hernia. BSE requested.   Assessment / Plan / Recommendation Clinical Impression  Clinical swallowing evaluation completed while Pt was sitting upright in bed; Pt reports she has been tolerating single ice chips, however when asked to take an ice chip it appears Pt is allowing the ice chip to melt and then using oral suction to clear majority of liquid rather than swallowing more than a trace amount. Pt took two very small cup sips of thin liquid initially without overt s/sx of aspiration, however within 2 minutes Pt was spitting up and using oral suction to remove frothy, mucus type clear secretions from oral cavity. She reported "it didn't go down"; she continued to gag and report significant discomfort from those limited trials (two sips of water). Pt refused solid or puree trials reporting "they will just come back". Question if MBSS would be of benefit in this case; Pt is unable to swallow enough for ST to objectively view the swallowing function; further note symptoms indicate dysphagia is esophageal in nature. Esophogram today (7/20) reports "Dilated thoracic esophagus with extremely poor motility and relative narrowing of the GE junction, suggestive of achalasia." There are no overt s/sx of oropharyngeal dysphagia, however secondary to Pt being unable to consume more than two sips makes a full subjective assessment impossible at this time; Recommend continue NPO - ST will continue to follow acutely and will defer to GI for all diet recommendations. Thank you, SLP Visit Diagnosis: Dysphagia, unspecified (  R13.10)    Aspiration Risk  Risk for inadequate nutrition/hydration;Mild aspiration risk    Diet Recommendation NPO        Other  Recommendations Oral Care Recommendations: Oral care BID   Follow up Recommendations        Frequency and Duration min 1 x/week  1  week       Prognosis Prognosis for Safe Diet Advancement: Fair Barriers to Reach Goals: Severity of deficits      Swallow Study   General Date of Onset: 01/13/21 HPI: Kathryn [name] is a 80 y.o. female with medical history significant for  hypertension, type 2 diabetes mellitus, GERD, positive ANA and SSA autoantibodies who presents to the emergency department due to more than 2 months history of decreased oral intake, generalized weakness and about 3 to 4-week onset of abdominal pain associated with difficulty in being able to swallow (solid and liquid).  She believes that she must have lost up to 30 pounds since onset of symptoms.  Patient endorsed that she usually regurgitates any food/liquid ingested within 15 to 20 minutes of ingestion.  Abdominal pain was in the epigastric area and was sharp in nature, this worsens with food.  She denies fever, chills, chest pain, shortness of breath.  She was admitted to the hospital from 12/25/2020-12/28/2020, at that time barium p.o. esophagram done urgently as an outpatient was indicated.  She was also admitted from 10/23/2020 to 10/25/2020 during which patient presented with proximal muscle weakness with an elevated CPK of 1206, this was treated with prednisone with improvement.  EGD done on 10/04/2020 showed normal dilated esophagus and small hiatal hernia. BSE requested. Type of Study: Bedside Swallow Evaluation Previous Swallow Assessment: none in chart Diet Prior to this Study: NPO Temperature Spikes Noted: No Respiratory Status: Room air History of Recent Intubation: No Behavior/Cognition: Alert;Cooperative;Pleasant mood Oral Cavity Assessment: Within Functional Limits Oral Care Completed by SLP: Recent completion by staff Oral Cavity - Dentition: Poor condition;Missing dentition Vision: Functional for self-feeding Self-Feeding Abilities: Able to feed self Patient Positioning: Upright in bed Baseline Vocal Quality: Normal Volitional Cough:  Strong Volitional Swallow: Able to elicit    Oral/Motor/Sensory Function Overall Oral Motor/Sensory Function: Within functional limits   Ice Chips Ice chips: Impaired   Thin Liquid Thin Liquid: Impaired    Nectar Thick Nectar Thick Liquid: Not tested   Honey Thick Honey Thick Liquid: Not tested   Puree Puree: Not tested   Solid     Solid: Not tested     Dimetri Armitage H. Romie Levee, CCC-SLP Speech Language Pathologist  Georgetta Haber 01/14/2021,2:40 PM

## 2021-01-15 ENCOUNTER — Encounter (HOSPITAL_COMMUNITY)
Admission: EM | Disposition: A | Discharge status: Skilled Nursing Facility | Payer: Self-pay | Source: Home / Self Care | Attending: Internal Medicine

## 2021-01-15 ENCOUNTER — Inpatient Hospital Stay (HOSPITAL_COMMUNITY): Payer: Medicare Other | Admitting: Anesthesiology

## 2021-01-15 DIAGNOSIS — R1010 Upper abdominal pain, unspecified: Secondary | ICD-10-CM | POA: Diagnosis not present

## 2021-01-15 DIAGNOSIS — K2970 Gastritis, unspecified, without bleeding: Secondary | ICD-10-CM

## 2021-01-15 DIAGNOSIS — E860 Dehydration: Secondary | ICD-10-CM | POA: Diagnosis not present

## 2021-01-15 DIAGNOSIS — R1310 Dysphagia, unspecified: Secondary | ICD-10-CM

## 2021-01-15 DIAGNOSIS — K220 Achalasia of cardia: Secondary | ICD-10-CM | POA: Diagnosis not present

## 2021-01-15 DIAGNOSIS — K229 Disease of esophagus, unspecified: Secondary | ICD-10-CM | POA: Diagnosis not present

## 2021-01-15 DIAGNOSIS — Z7189 Other specified counseling: Secondary | ICD-10-CM

## 2021-01-15 HISTORY — PX: ESOPHAGOGASTRODUODENOSCOPY (EGD) WITH PROPOFOL: SHX5813

## 2021-01-15 HISTORY — PX: BOTOX INJECTION: SHX5754

## 2021-01-15 LAB — BASIC METABOLIC PANEL
Anion gap: 11 (ref 5–15)
BUN: 21 mg/dL (ref 8–23)
CO2: 26 mmol/L (ref 22–32)
Calcium: 9 mg/dL (ref 8.9–10.3)
Chloride: 109 mmol/L (ref 98–111)
Creatinine, Ser: 0.56 mg/dL (ref 0.44–1.00)
GFR, Estimated: >60 mL/min (ref >60)
Glucose, Bld: 97 mg/dL (ref 70–99)
Potassium: 3 mmol/L — ABNORMAL LOW (ref 3.5–5.1)
Sodium: 146 mmol/L — ABNORMAL HIGH (ref 135–145)

## 2021-01-15 LAB — GLUCOSE, CAPILLARY
Glucose-Capillary: 112 mg/dL — ABNORMAL HIGH (ref 70–99)
Glucose-Capillary: 95 mg/dL (ref 70–99)

## 2021-01-15 LAB — CBC
HCT: 33.8 % — ABNORMAL LOW (ref 36.0–46.0)
Hemoglobin: 10.6 g/dL — ABNORMAL LOW (ref 12.0–15.0)
MCH: 31.5 pg (ref 26.0–34.0)
MCHC: 31.4 g/dL (ref 30.0–36.0)
MCV: 100.6 fL — ABNORMAL HIGH (ref 80.0–100.0)
Platelets: 165 10*3/uL (ref 150–400)
RBC: 3.36 MIL/uL — ABNORMAL LOW (ref 3.87–5.11)
RDW: 15.3 % (ref 11.5–15.5)
WBC: 10.2 10*3/uL (ref 4.0–10.5)
nRBC: 0 % (ref 0.0–0.2)

## 2021-01-15 LAB — MAGNESIUM: Magnesium: 2 mg/dL (ref 1.7–2.4)

## 2021-01-15 SURGERY — ESOPHAGOGASTRODUODENOSCOPY (EGD) WITH PROPOFOL
Anesthesia: General

## 2021-01-15 MED ORDER — DEXTROSE 5 % IV SOLN: INTRAVENOUS | Status: DC

## 2021-01-15 MED ORDER — POTASSIUM CHLORIDE 10 MEQ/100ML IV SOLN
10.0000 meq | INTRAVENOUS | Status: AC
  Administered 2021-01-15 (×4): 10 meq via INTRAVENOUS
  Filled 2021-01-15 (×2): qty 100

## 2021-01-15 MED ORDER — LACTATED RINGERS IV SOLN: INTRAVENOUS | Status: DC | PRN

## 2021-01-15 MED ORDER — STERILE WATER FOR IRRIGATION IR SOLN
Status: DC | PRN
  Administered 2021-01-15: 100 mL

## 2021-01-15 MED ORDER — PROPOFOL 10 MG/ML IV BOLUS
INTRAVENOUS | Status: DC | PRN
  Administered 2021-01-15: 30 mg via INTRAVENOUS
  Administered 2021-01-15: 20 mg via INTRAVENOUS
  Administered 2021-01-15: 50 mg via INTRAVENOUS
  Administered 2021-01-15: 20 mg via INTRAVENOUS

## 2021-01-15 MED ORDER — SODIUM CHLORIDE (PF) 0.9 % IJ SOLN
100.0000 [IU] | Freq: Once | INTRAMUSCULAR | Status: AC
  Administered 2021-01-15: 100 [IU] via SUBMUCOSAL
  Filled 2021-01-15: qty 100

## 2021-01-15 MED ORDER — LIDOCAINE HCL (CARDIAC) PF 100 MG/5ML IV SOSY
PREFILLED_SYRINGE | INTRAVENOUS | Status: DC | PRN
  Administered 2021-01-15: 50 mg via INTRAVENOUS

## 2021-01-15 MED ORDER — SODIUM CHLORIDE 0.9 % IV SOLN: INTRAVENOUS | Status: DC

## 2021-01-15 MED ORDER — LACTATED RINGERS IV SOLN: INTRAVENOUS | Status: DC

## 2021-01-15 NOTE — Consult Note (Signed)
Consultation Note Date: 01/15/2021   Patient Name: Kathryn [name]  DOB: [birth year]  MRN: [id number]  Age / Sex: 80 y.o., female  PCP: Leeanne Rio, MD Referring Physician: Murlean Iba, MD  Reason for Consultation: Establishing goals of care  HPI/Patient Profile: 80 y.o. female  with past medical history of DM2, HTN,  recent Covid infection (12/25/2020), positive ANA and SSA (appointment with rhuematologist scheduled for 8/1), achalasia admitted on 01/13/2021 with decreased po intake and weight loss for 2 months. Palliative medicine consulted for goals of care.   Clinical Assessment and Goals of Care: Met with patient's daughter- Hulan Amato who goes by [place].  Patient is a poor historian per chart note.  Zenovia Jordan expresses the frustration she has had in getting her mom seen and being offered treatment options.  Chart review notes that she was admitted 4/28-4/30 with proximal muscle weakness that improved with prednisone. Rehospitalized 6/30-7/3 with complaints of weakness and poor po intake, found to be COVID+. At that time followup with GI was recommended for her possible achalasia, however she was given an appointment for November and she continued to have poor po intake in the interim.  Zenovia Jordan shares that her mom has had a sudden and quick decline in her functional status. Prior to April she was an active person, driving, living independently. In the last two months she has become limited to ambulating in her home and requiring assistance with ADL's.  At this point the primary goals of care are to continue to seek out the reason for her sudden decline, and inquire about treatment options. They are hoping that the EGD with botox injections she is having today will improve her ability to take in po nutrition.  Additionally, they are even more interested in the consult with there rheumatologist to discover  possible autoimmune causes and possible treatments that will help restore her to her prior level of functioning. Advanced care [place] would not want a feeding tube. As far as ongoing treatment she is interested in continuing diagnostic workups and accepting of offered treatments.  Zenovia Jordan is uncertain about code status decision. Rainy expresses for now to continue full code status- but she is aware of the importance of discussing this further with her Mom and her family in the context of patient's values and goals- we discussed the need to hope for the best- but be prepared if situations do not proceed as we would wish. Also discussed concern that CPR would harm her Mom more than help her and would result in her functional status being even more decreased than it currently is.   Rainy is amenable to outpatient Palliative referral.    Primary Decision Maker NEXT OF KIN- daughter- Zenovia Jordan    SUMMARY OF RECOMMENDATIONS -Full scope, full code -No feeding tube -Outpatient Palliative referral    Code Status/Advance Care Planning: Full code  Prognosis:   Unable to determine  Discharge Planning: Home with Palliative Services  Primary Diagnoses: Present on Admission:  Nausea and vomiting  HTN (hypertension)  I have reviewed the medical record, interviewed the patient and family, and examined the patient. The following aspects are pertinent.  Past Medical History:  Diagnosis Date   Acid reflux    Diabetes ([place])    controlled with diet.    HTN (hypertension)    Social History   Socioeconomic History   Marital status: Widowed    Spouse name: Not on file   Number of children: Not on file   Years of education: Not on file   Highest education level: Not on file  Occupational History   Not on file  Tobacco Use   Smoking status: Never   Smokeless tobacco: Never  Vaping Use   Vaping Use: Never used  Substance and Sexual Activity   Alcohol use: No   Drug use: No   Sexual  activity: Never  Other Topics Concern   Not on file  Social History Narrative   Not on file   Social Determinants of Health   Financial Resource Strain: Not on file  Food Insecurity: Not on file  Transportation Needs: Not on file  Physical Activity: Not on file  Stress: Not on file  Social Connections: Not on file   Scheduled Meds:  [MAR Hold] pantoprazole (PROTONIX) IV  40 mg Intravenous Q24H   Continuous Infusions:  sodium chloride     [MAR Hold] cefTRIAXone (ROCEPHIN)  IV 1 g (01/15/21 0817)   dextrose 50 mL/hr at 01/15/21 0813   lactated ringers 50 mL/hr at 01/15/21 1335   PRN Meds:.[MAR Hold]  morphine injection, [MAR Hold] prochlorperazine Medications Prior to Admission:  Prior to Admission medications   Medication Sig Start Date End Date Taking? Authorizing Provider  losartan (COZAAR) 50 MG tablet Take 50 mg by mouth daily. 09/26/20  Yes [provider]  metoprolol tartrate (LOPRESSOR) 25 MG tablet Take 1 tablet (25 mg total) by mouth 2 (two) times daily. 10/25/20  Yes [name], MD  metoprolol tartrate (LOPRESSOR) 25 MG tablet Take by mouth. 12/30/20  Yes [provider]  pantoprazole (PROTONIX) 40 MG tablet Take 40 mg by mouth daily.   Yes [provider]  triamcinolone ointment (KENALOG) 0.1 % Apply topically. 09/26/20  Yes [provider]  aspirin 81 MG tablet Take 81 mg by mouth daily. Patient not taking: No sig reported    [provider]   Allergies  Allergen Reactions   Cyclobenzaprine Hcl Swelling   Review of Systems  Physical Exam  Vital Signs: BP 135/76   Pulse (!) 103   Temp 97.8 F (36.6 C)   Resp (!) 24   Ht $R'5\' 3"'LV$  (1.6 m)   Wt 54.4 kg   SpO2 96%   BMI 21.26 kg/m  Pain Scale: 0-10   Pain Score: 0-No pain   SpO2: SpO2: 96 % O2 Device:SpO2: 96 % O2 Flow Rate: .O2 Flow Rate (L/min): 4 L/min  IO: Intake/output summary:  Intake/Output Summary (Last 24 hours) at 01/15/2021 1536 Last data filed at  01/15/2021 1454 Gross per 24 hour  Intake 1000 ml  Output 300 ml  Net 700 ml    LBM: Last BM Date: 01/11/21 Baseline Weight: Weight: 54.4 kg Most recent weight: Weight: 54.4 kg     Palliative Assessment/Data: PPS: 50%     Thank you for this consult. Palliative medicine will continue to follow and assist as needed.   Time In: 1429 Time Out: 1554 Time Total: 85 mins Greater than 50%  of this time was spent counseling and coordinating  care related to the above assessment and plan.  Signed by: Mariana Kaufman, AGNP-C Palliative Medicine    Please contact Palliative Medicine Team phone at [phone] for questions and concerns.  For individual provider: See Shea Evans

## 2021-01-15 NOTE — Clinical Social Work Note (Signed)
Spoke with daughter, Kathryn [name], discussed PT recommendation for SNF. Kathryn [name] prefers HHPT at this time. Patient lives with a person who has a handicapp. For the past three months she has been ambulating with a walker and for the past 2-3 months Kathryn [name] has been assisting her with bathing, grooming, and dressing.  TOC to locate [facility] company accepting of patient's insurance at this time.    [name], [name], LCSW

## 2021-01-15 NOTE — Transfer of Care (Signed)
Immediate Anesthesia Transfer of Care Note  Patient: Kathryn [name]  Procedure(s) Performed: ESOPHAGOGASTRODUODENOSCOPY (EGD) WITH PROPOFOL BOTOX INJECTION  Patient Location: PACU  Anesthesia Type:General  Level of Consciousness: awake  Airway & Oxygen Therapy: Patient Spontanous Breathing and Patient connected to nasal cannula oxygen  Post-op Assessment: Report given to RN and Post -op Vital signs reviewed and stable  Post vital signs: Reviewed and stable  Last Vitals:  Vitals Value Taken Time  BP 166/86   Temp    Pulse 121   Resp 16 01/15/21 1457  SpO2 100%   Vitals shown include unvalidated device data.  Last Pain:  Vitals:   01/15/21 1431  TempSrc:   PainSc: 8       Patients Stated Pain Goal: 0 (01/15/21 0819)  Complications: No notable events documented.

## 2021-01-15 NOTE — Interval H&P Note (Signed)
History and Physical Interval Note:  01/15/2021 2:05 PM  Kathryn [name]  has presented today for surgery, with the diagnosis of Dysphagia secondary to achalasia.  The various methods of treatment have been discussed with the patient and family. After consideration of risks, benefits and other options for treatment, the patient has consented to  Procedure(s): ESOPHAGOGASTRODUODENOSCOPY (EGD) WITH PROPOFOL (N/A) BOTOX INJECTION (N/A) as a surgical intervention.  The patient's history has been reviewed, patient examined, no change in status, stable for surgery.  I have reviewed the patient's chart and labs.  Questions were answered to the patient's satisfaction.     Lanelle Bal

## 2021-01-15 NOTE — Anesthesia Postprocedure Evaluation (Signed)
Anesthesia Post Note  Patient: Kathryn [name]  Procedure(s) Performed: ESOPHAGOGASTRODUODENOSCOPY (EGD) WITH PROPOFOL BOTOX INJECTION  Patient location during evaluation: PACU Anesthesia Type: General Level of consciousness: awake and alert and oriented Pain management: pain level controlled Vital Signs Assessment: post-procedure vital signs reviewed and stable Respiratory status: spontaneous breathing and respiratory function stable Cardiovascular status: blood pressure returned to baseline and stable Postop Assessment: no apparent nausea or vomiting Anesthetic complications: no   No notable events documented.   Last Vitals:  Vitals:   01/15/21 1500 01/15/21 1515  BP: (!) 154/69 135/76  Pulse: (!) 112 (!) 103  Resp: 13 (!) 24  Temp:    SpO2: 100% 96%    Last Pain:  Vitals:   01/15/21 1515  TempSrc:   PainSc: 0-No pain                 Lessie Funderburke C Shakiara Lukic

## 2021-01-15 NOTE — Plan of Care (Signed)
  Problem: Acute Rehab PT Goals(only PT should resolve) Goal: Pt Will Go Supine/Side To Sit Outcome: Progressing Flowsheets (Taken 01/15/2021 1347) Pt will go Supine/Side to Sit:  with modified independence  with supervision Goal: Patient Will Transfer Sit To/From Stand Outcome: Progressing Flowsheets (Taken 01/15/2021 1347) Patient will transfer sit to/from stand:  with supervision  with min guard assist Goal: Pt Will Transfer Bed To Chair/Chair To Bed Outcome: Progressing Flowsheets (Taken 01/15/2021 1347) Pt will Transfer Bed to Chair/Chair to Bed:  min guard assist  with min assist Goal: Pt Will Ambulate Outcome: Progressing Flowsheets (Taken 01/15/2021 1347) Pt will Ambulate:  50 feet  with min guard assist  with rolling walker   1:47 PM, 01/15/21 Ocie Bob, MPT Physical Therapist with [facility] [phone] office [phone] mobile phone

## 2021-01-15 NOTE — Progress Notes (Signed)
SLP Cancellation Note  Patient Details Name: Kathryn [name] MRN: [id number] DOB: [birth year]   Cancelled treatment:        Pt is off the floor for EGD                                                                                                 Thank you,  Havery Moros, CCC-SLP [phone]  Brandilyn Nanninga 01/15/2021, 2:03 PM

## 2021-01-15 NOTE — Anesthesia Preprocedure Evaluation (Signed)
Anesthesia Evaluation  Patient identified by MRN, date of birth, ID band Patient awake    Reviewed: Allergy & Precautions, NPO status , Patient's Chart, lab work & pertinent test results, reviewed documented beta blocker date and time   History of Anesthesia Complications Negative for: history of anesthetic complications  Airway Mallampati: III  TM Distance: >3 FB Neck ROM: Full    Dental  (+) Dental Advisory Given, Edentulous Upper, Missing   Pulmonary neg pulmonary ROS,    Pulmonary exam normal breath sounds clear to auscultation       Cardiovascular Exercise Tolerance: Good hypertension, Pt. on home beta blockers and Pt. on medications  Rhythm:Regular Rate:Tachycardia     Neuro/Psych  Neuromuscular disease    GI/Hepatic GERD  Medicated and Poorly Controlled,  Endo/Other  diabetes, Well Controlled, Type 2  Renal/GU      Musculoskeletal   Abdominal   Peds  Hematology  (+) anemia ,   Anesthesia Other Findings Hypokalemia  Prolonged QT  Reproductive/Obstetrics                             Anesthesia Physical Anesthesia Plan  ASA: 3  Anesthesia Plan: General   Post-op Pain Management:    Induction:   PONV Risk Score and Plan: Propofol infusion  Airway Management Planned: Nasal Cannula and Natural Airway  Additional Equipment:   Intra-op Plan:   Post-operative Plan:   Informed Consent: I have reviewed the patients History and Physical, chart, labs and discussed the procedure including the risks, benefits and alternatives for the proposed anesthesia with the patient or authorized representative who has indicated his/her understanding and acceptance.     Dental advisory given  Plan Discussed with: CRNA and Surgeon  Anesthesia Plan Comments:         Anesthesia Quick Evaluation

## 2021-01-15 NOTE — Care Management Important Message (Signed)
Important Message  Patient Details  Name: Kathryn [name] MRN: [id number] Date of Birth: 04/09/1941   Medicare Important Message Given:  Yes     Corey Harold 01/15/2021, 4:31 PM

## 2021-01-15 NOTE — Op Note (Signed)
[facility] Patient Name: Kathryn [name] Procedure Date: 01/15/2021 2:20 PM MRN: [id number] Date of Birth: 10-02-1940 Attending MD: Elon Alas. Abbey Chatters DO CSN: [id number] Age: 80 Admit Type: Inpatient Procedure:                Upper GI endoscopy Indications:              Dysphagia, Abnormal esophagram Providers:                Elon Alas. Abbey Chatters, DO, Crystal Page, Nelma Rothman,                            Technician Referring MD:              Medicines:                See the Anesthesia note for documentation of the                            administered medications Complications:            No immediate complications. Estimated Blood Loss:     Estimated blood loss was minimal. Procedure:                Pre-Anesthesia Assessment:                           - The anesthesia plan was to use monitored                            anesthesia care (MAC).                           After obtaining informed consent, the endoscope was                            passed under direct vision. Throughout the                            procedure, the patient's blood pressure, pulse, and                            oxygen saturations were monitored continuously. The                            [id number]-H190 ([id number]) scope was introduced through the                            mouth, and advanced to the second part of duodenum.                            The upper GI endoscopy was accomplished without                            difficulty. The patient tolerated the procedure                            well. Scope In: 2:39:05 PM Scope  Out: 2:52:31 PM Total Procedure Duration: 0 hours 13 minutes 26 seconds  Findings:      Fluid was found in the middle third of the esophagus and in the lower       third of the esophagus.      A hypertonic lower esophageal sphincter was found. There was mild       resistance to endoscope advancement into the stomach. The       gastroesophageal junction and cardia were normal on  retroflexed view.       Area was successfully injected with 100 units botulinum toxin.      Bilious fluid was found in the entire examined stomach. Approx. 900 cc       was suctioned out      Diffuse moderate inflammation characterized by congestion (edema) and       erythema was found in the entire examined stomach.      The duodenal bulb, first portion of the duodenum and second portion of       the duodenum were normal. Impression:               - Fluid in the middle third of the esophagus and in                            the lower third of the esophagus.                           - The examination was suspicious for achalasia.                            Injected with botulinum toxin.                           - Bilious gastric fluid.                           - Gastritis.                           - Normal duodenal bulb, first portion of the                            duodenum and second portion of the duodenum.                           - No specimens collected. Moderate Sedation:      Per Anesthesia Care Recommendation:           - Return patient to hospital ward for ongoing care.                           - Clear liquid diet.                           - Use a proton pump inhibitor PO BID.                           - Continue to monitor for improvement in patient's  dysphagia s/p botox injection Procedure Code(s):        --- Professional ---                           [id number], Esophagogastroduodenoscopy, flexible,                            transoral; with directed submucosal injection(s),                            any substance Diagnosis Code(s):        --- Professional ---                           K29.70, Gastritis, unspecified, without bleeding                           R13.10, Dysphagia, unspecified CPT copyright 2019 American Medical Association. All rights reserved. The codes documented in this report are preliminary and upon coder review may  be  revised to meet current compliance requirements. Elon Alas. Abbey Chatters, DO [place] Abbey Chatters, DO 01/15/2021 3:08:38 PM This report has been signed electronically. Number of Addenda: 0

## 2021-01-15 NOTE — Evaluation (Signed)
Physical Therapy Evaluation Patient Details Name: Kathryn [name] MRN: [id number] DOB: [birth year] Today's Date: 01/15/2021   History of Present Illness  Kathryn [name] is a 80 y.o. female with medical history significant for  hypertension, type 2 diabetes mellitus, GERD, positive ANA and SSA autoantibodies who presents to the emergency department due to more than 2 months history of decreased oral intake, generalized weakness and about 3 to 4-week onset of abdominal pain associated with difficulty in being able to swallow (solid and liquid).  She believes that she must have lost up to 30 pounds since onset of symptoms.  Patient endorsed that she usually regurgitates any food/liquid ingested within 15 to 20 minutes of ingestion.  Abdominal pain was in the epigastric area and was sharp in nature, this worsens with food.  She denies fever, chills, chest pain, shortness of breath.  She was admitted to the hospital from 12/25/2020-12/28/2020, at that time barium p.o. esophagram done urgently as an outpatient was indicated.  She was also admitted from 10/23/2020 to 10/25/2020 during which patient presented with proximal muscle weakness with an elevated CPK of 1206, this was treated with prednisone with improvement.  EGD done on 10/04/2020 showed normal dilated esophagus and small hiatal hernia.   Clinical Impression  Patient demonstrates slow labored movement for sitting up at bedside, increased time for completing sit to stands, slow labored cadence with near loss of balance when making turns using RW and limited mostly due to c/o fatigue and dizziness.  Patient tolerated sitting up in chair after therapy - RN notified.  Patient will benefit from continued physical therapy in hospital and recommended venue below to increase strength, balance, endurance for safe ADLs and gait.       Follow Up Recommendations SNF;Supervision for mobility/OOB;Supervision - Intermittent    Equipment Recommendations  None recommended  by PT    Recommendations for Other Services       Precautions / Restrictions Precautions Precautions: Fall Restrictions Weight Bearing Restrictions: No      Mobility  Bed Mobility Overal bed mobility: Needs Assistance Bed Mobility: Supine to Sit     Supine to sit: Min assist     General bed mobility comments: increased time, labored movement    Transfers Overall transfer level: Needs assistance Equipment used: Rolling walker (2 wheeled) Transfers: Sit to/from [employer] Sit to Stand: Min guard;Min assist Stand pivot transfers: Min assist       General transfer comment: increased time, labored movement  Ambulation/Gait Ambulation/Gait assistance: Min assist;Mod assist Gait Distance (Feet): 20 Feet Assistive device: Rolling walker (2 wheeled) Gait Pattern/deviations: Decreased step length - right;Decreased step length - left;Decreased stride length Gait velocity: decreased   General Gait Details: slow labored cadence with near loss of balance when making turns, limited mostly due to c/o fatigue and dizziness  Stairs            Wheelchair Mobility    Modified Rankin (Stroke Patients Only)       Balance Overall balance assessment: Needs assistance Sitting-balance support: Feet supported;No upper extremity supported Sitting balance-Leahy Scale: Fair Sitting balance - Comments: fair/good seated at EOB   Standing balance support: During functional activity;Bilateral upper extremity supported Standing balance-Leahy Scale: Fair Standing balance comment: using RW                             Pertinent Vitals/Pain Pain Assessment: No/denies pain    Home Living Family/patient expects to  be discharged to:: Private residence Living Arrangements: Children Available Help at Discharge: Family;Available 24 hours/day Type of Home: House Home Access: Level entry     Home Layout: One level Home Equipment: Walker - 2 wheels;Shower  seat      Prior Function Level of Independence: Needs assistance   Gait / Transfers Assistance Needed: household ambulator using RW  ADL's / Homemaking Assistance Needed: assisted by family        Hand Dominance        Extremity/Trunk Assessment   Upper Extremity Assessment Upper Extremity Assessment: Generalized weakness    Lower Extremity Assessment Lower Extremity Assessment: Generalized weakness    Cervical / Trunk Assessment Cervical / Trunk Assessment: Normal  Communication   Communication: No difficulties  Cognition Arousal/Alertness: Awake/alert Behavior During Therapy: WFL for tasks assessed/performed Overall Cognitive Status: Within Functional Limits for tasks assessed                                        General Comments      Exercises     Assessment/Plan    PT Assessment Patient needs continued PT services  PT Problem List Decreased strength;Decreased activity tolerance;Decreased balance;Decreased mobility       PT Treatment Interventions DME instruction;Gait training;Stair training;Functional mobility training;Therapeutic activities;Therapeutic exercise;Patient/family education;Balance training    PT Goals (Current goals can be found in the Care Plan section)  Acute Rehab PT Goals Patient Stated Goal: return home with family to assist PT Goal Formulation: With patient/family Time For Goal Achievement: 01/29/21 Potential to Achieve Goals: Good    Frequency Min 3X/week   Barriers to discharge        Co-evaluation               AM-PAC PT "6 Clicks" Mobility  Outcome Measure Help needed turning from your back to your side while in a flat bed without using bedrails?: A Little Help needed moving from lying on your back to sitting on the side of a flat bed without using bedrails?: A Little Help needed moving to and from a bed to a chair (including a wheelchair)?: A Little Help needed standing up from a chair using  your arms (e.g., wheelchair or bedside chair)?: A Little Help needed to walk in hospital room?: A Lot Help needed climbing 3-5 steps with a railing? : A Lot 6 Click Score: 16    End of Session   Activity Tolerance: Patient tolerated treatment well;Patient limited by fatigue Patient left: in chair;with call bell/phone within reach Nurse Communication: Mobility status PT Visit Diagnosis: Unsteadiness on feet (R26.81);Other abnormalities of gait and mobility (R26.89);Muscle weakness (generalized) (M62.81)    Time: 1020-1053 PT Time Calculation (min) (ACUTE ONLY): 33 min   Charges:   PT Evaluation $PT Eval Moderate Complexity: 1 Mod PT Treatments $Therapeutic Activity: 23-37 mins        1:46 PM, 01/15/21 Ocie Bob, MPT Physical Therapist with [facility] [phone] office [phone] mobile phone

## 2021-01-15 NOTE — Progress Notes (Signed)
PROGRESS NOTE    KYLE STANSELL  VXY:[id number] DOB: [birth year] DOA: 01/13/2021 PCP: Suzan Slick, MD  Outpatient Specialists: Rheumatology (first appointment 01/26/21)   Brief Narrative:   Kathryn [name] is a 80 y.o. female with medical history significant for  hypertension, type 2 diabetes mellitus, GERD, positive ANA and SSA autoantibodies, and chronic achalasia (2014 manometry showed abnormal motility)  who presents to the emergency department due to more than 2 months history of decreased oral intake, generalized weakness and about 3 to 4-week onset of abdominal pain associated with difficulty in being able to swallow (solid and liquid).  She believes that she must have lost up to 30 pounds since onset of symptoms.  Patient endorsed that she usually regurgitates any food/liquid ingested within 15 to 20 minutes of ingestion.  Abdominal pain was in the epigastric area and was sharp in nature, this worsens with food.  She denies fever, chills, chest pain, shortness of breath.   She was admitted to the hospital from 12/25/2020-12/28/2020, at that time barium p.o. esophagram done urgently as an outpatient was indicated.  She was also admitted from 10/23/2020 to 10/25/2020 during which patient presented with proximal muscle weakness with an elevated CPK of 1206 that was concerning for polymyalgia rheumatica. This was treated with prednisone with improvement.  EGD done on 10/04/2020 showed normal dilated esophagus and small hiatal hernia.   In the emergency department, she was intermittently tachypneic and tachycardic.  Other vital signs were within normal range.  Work-up in the ED showed normocytic anemia and leukocytosis, BMP was normal except for elevated BUN at and hyperglycemia with CBG of 135.  Albumin 3.2. CT of head without contrast showed no acute intracranial abnormality. IV hydration was provided, hospitalist was asked to admit patient for further evaluation and management.   7/20 - Patient  still experiences significant epigastric pain. Using suction for oral secretion. No other acute complaints. GI (Dr. Karilyn Cota) was consulted and will provide further recommendations. Of note, patient has not been able to follow up with outpatient rheumatology and has not been receiving treatments    7/21 - Patient is clinically stable. Awaiting EGD today. NPO since midnight. No acute complaints of pain/diarrhea/vomiting/nausea   Assessment & Plan:   Principal Problem:   Abdominal pain Active Problems:   HTN (hypertension)   Dysphagia   Nausea and vomiting   Generalized weakness   Dehydration   Normocytic anemia   Leukocytosis   Hyperglycemia   Hypoalbuminemia due to protein-calorie malnutrition (HCC)   Prolonged QT interval   Achalasia   Malnutrition of moderate degree  Epigastric/Abdominal pain, nausea and vomiting in the setting of esophageal dysphagia Patient is ANA/SSA antibody positive. Sjogren's syndrome and SLE are on the differential. Patient also has had increased oral secretion, suggestive of sicca-related symptoms. Gastrointestinal involvement of SLE can manifest as esophageal motility disorder, such as achalasia, which has been suggesting in this patient. Other GI manifestations of SLE include acute pancreatitis, for which  patient signs and symptoms are concerning, but CT 6/30 has shown no acute pancreatic findings. EGD in April 2022 showed unremarkable esophagus, and patient underwent dialtion given history of dysphagia, but patient states that did not help with symptoms.   -Continue IV NS at 75 mLs/Hr -Continue IV morphine 2 mg q.4h p.r.n. for moderate to severe pain -Continue Protonix -Continue IV medications p.r.n. -Continue clear liquid diet with plan to advanced diet as tolerated -Gastroenterology was consulted during last admission and nonemergent barium pill esophagram was scheduled  as an outpatient. Gastroenterology (Dr. Karilyn Cota) to evaluate patient with  recommendations pending. - F/u barium swallow test   Generalized weakness and dehydration in the setting of above -Hypoalbuminemia secondary to mild protein calorie malnutrition -Continue IV hydration as described above -Continue protein supplement   Urinary tract infection - UA in the ED showed many bacteria, moderate ketones, nitrites -Patient is on ceftriaxone 1g for coverage (to be d/c 7/23)  Normocytic anemia -Chronic and Stable  Leukocytosis/hyperglycemia possibly reactive - WBC normalized to 10 this AM - Continue to monitor WBC and blood glucose with morning labs   Prolonged QTc (499 ms) Avoid QT prolonging drugs Magnesium 1.9 - Follow up on EKG this AM   COVID-19 infection (incidental finding) -Patient is asymptomatic and stable on room air -This was already diagnosed during last admission -Continue airborne and contact precautions   Essential hypertension -Continue Lopressor  Hyperglycemia secondary to T2DM -No medication noted on med rec -Hemoglobin A1c on 6/30-5.8   GERD -Continue Protonix     DVT prophylaxis: SCD Code Status: Full Family Communication: Phone contact to daughter Winnifred (who was at bedside) Disposition Plan: SNF or family member's home   Consultants:  Gastroenterology (Dr. Jena Gauss) Palliative care   Procedures:  Barium swallow 7/20 EGD with botox injection 7/21   Consultants:  GI  Procedures:  EGD 7/21   Subjective: Pt reports that she cannot swallow   Objective: Vitals:   01/14/21 0341 01/14/21 1423 01/14/21 2118 01/15/21 0513  BP: 137/78 (!) 154/84 122/72 (!) 144/81  Pulse: (!) 102 (!) 108 (!) 105 (!) 101  Resp: 18 17 18 19   Temp: 97.7 F (36.5 C) 98.2 F (36.8 C) 97.7 F (36.5 C) 97.7 F (36.5 C)  TempSrc: Oral Oral Oral Oral  SpO2: 100% 100% 99% 100%  Weight:      Height:        Intake/Output Summary (Last 24 hours) at 01/15/2021 1128 Last data filed at 01/15/2021 0543 Gross per 24 hour  Intake 1040.83  ml  Output 300 ml  Net 740.83 ml   Filed Weights   01/13/21 1909  Weight: 54.4 kg    Examination:  General exam: Appears calm and comfortable  Respiratory system: Clear to auscultation. Respiratory effort normal. Cardiovascular system: S1 & S2 heard, RRR. No JVD, murmurs, rubs, gallops or clicks. No pedal edema. Gastrointestinal system: Abdomen is nondistended, soft and nontender. No organomegaly or masses felt. Normal bowel sounds heard. Central nervous system: Alert and oriented. No focal neurological deficits. Extremities: Symmetric 5 x 5 power. Skin: No rashes, lesions or ulcers Psychiatry: Judgement and insight appear normal. Mood & affect appropriate.   Data Reviewed: I have personally reviewed following labs and imaging studies  CBC: Recent Labs  Lab 01/13/21 2014 01/14/21 0409 01/15/21 0350  WBC 11.5* 10.2 10.2  NEUTROABS 10.3*  --   --   HGB 11.2* 11.1* 10.6*  HCT 34.7* 35.1* 33.8*  MCV 98.6 99.7 100.6*  PLT 171 166 165   Basic Metabolic Panel: Recent Labs  Lab 01/13/21 2014 01/14/21 0409 01/15/21 0350 01/15/21 0712  NA 140 143 146*  --   K 3.6 3.5 3.0*  --   CL 100 105 109  --   CO2 27 28 26   --   GLUCOSE 135* 105* 97  --   BUN 30* 28* 21  --   CREATININE 0.75 0.64 0.56  --   CALCIUM 9.3 9.3 9.0  --   MG  --   --   --  2.0   GFR: Estimated Creatinine Clearance: 45.6 mL/min (by C-G formula based on SCr of 0.56 mg/dL). Liver Function Tests: Recent Labs  Lab 01/13/21 2014 01/14/21 0409  AST 27 23  ALT 15 14  ALKPHOS 54 55  BILITOT 1.0 0.7  PROT 8.0 7.7  ALBUMIN 3.2* 3.1*   Recent Labs  Lab 01/13/21 2014  LIPASE 37   No results for input(s): AMMONIA in the last 168 hours. Coagulation Profile: Recent Labs  Lab 01/14/21 0409  INR 1.0   Cardiac Enzymes: No results for input(s): CKTOTAL, CKMB, CKMBINDEX, TROPONINI in the last 168 hours. BNP (last 3 results) No results for input(s): PROBNP in the last 8760 hours. HbA1C: No results  for input(s): HGBA1C in the last 72 hours. CBG: No results for input(s): GLUCAP in the last 168 hours. Lipid Profile: No results for input(s): CHOL, HDL, LDLCALC, TRIG, CHOLHDL, LDLDIRECT in the last 72 hours. Thyroid Function Tests: No results for input(s): TSH, T4TOTAL, FREET4, T3FREE, THYROIDAB in the last 72 hours. Anemia Panel: No results for input(s): VITAMINB12, FOLATE, FERRITIN, TIBC, IRON, RETICCTPCT in the last 72 hours. Urine analysis:    Component Value Date/Time   COLORURINE AMBER (A) 01/13/2021 2309   APPEARANCEUR CLOUDY (A) 01/13/2021 2309   LABSPEC 1.025 01/13/2021 2309   PHURINE 5.0 01/13/2021 2309   GLUCOSEU NEGATIVE 01/13/2021 2309   HGBUR SMALL (A) 01/13/2021 2309   BILIRUBINUR NEGATIVE 01/13/2021 2309   KETONESUR 20 (A) 01/13/2021 2309   PROTEINUR 100 (A) 01/13/2021 2309   NITRITE POSITIVE (A) 01/13/2021 2309   LEUKOCYTESUR MODERATE (A) 01/13/2021 2309   )No results found for this or any previous visit (from the past 240 hour(s)).  Radiology Studies: CT Head Wo Contrast  Result Date: 01/13/2021 CLINICAL DATA:  Delirium. EXAM: CT HEAD WITHOUT CONTRAST TECHNIQUE: Contiguous axial images were obtained from the base of the skull through the vertex without intravenous contrast. COMPARISON:  None. FINDINGS: Brain: No evidence of acute infarction, hemorrhage, hydrocephalus, extra-axial collection or mass lesion/mass effect. Vascular: No hyperdense vessel or unexpected calcification. Skull: Normal. Negative for fracture or focal lesion. Sinuses/Orbits: No acute finding. Other: None. IMPRESSION: No acute intracranial abnormality seen. Electronically Signed   By: Lupita RaiderJames  Green Jr M.D.   On: 01/13/2021 20:34   DG ESOPHAGUS W DOUBLE CM (HD)  Result Date: 01/14/2021 CLINICAL DATA:  Achalasia by prior esophageal manometry, dysphagia, regurgitation EXAM: ESOPHOGRAM / BARIUM SWALLOW / BARIUM TABLET STUDY TECHNIQUE: Combined double contrast and single contrast examination performed  using effervescent crystals, thick barium liquid, and thin barium liquid. 12.5 mm diameter barium tablet was not administered. FLUOROSCOPY TIME:  Fluoroscopy Time:  3 minutes 18 seconds Radiation Exposure Index (if provided by the fluoroscopic device): 55.3 mGy Number of Acquired Spot Images: multiple fluoroscopic screen captures COMPARISON:  None FINDINGS: Patient unable to safely stand for upright imaging. Study performed horizontal up to 40 degrees of head elevation. Thoracic esophagus appears dilated with relative narrowing of the gastroesophageal junction. Extremely poor esophageal motility with very weak to absent primary peristaltic waves, occasional secondary waves, and multiple non propulsive tertiary waves. Prolonged retention of contrast within thoracic esophagus despite elevation of table 40 degrees. Questionable linear filling defect at distal esophagus. Suspected ulcer along LEFT anterolateral aspect of the distal esophagus (RF#3 image 182/199 and RF#4 image 157/157)). No definite hiatal hernia. Focal area of narrowing at the cervical esophagus as well, though this could be relative related to distention of remainder of esophagus. No definite aspiration of contrast. At AP  window, a potential small diverticulum or less likely additional ulcer is seen, RF#5 image109/127, RF#1 image 3/85. IMPRESSION: Dilated thoracic esophagus with extremely poor motility and relative narrowing of the GE junction, suggestive of achalasia. Probable ulcer at distal esophagus with additional diverticulum versus less likely ulcer at the level of the AP window. Questionable linear filling defect at distal esophagus such as a web or related to an ulcer. Electronically Signed   By: Ulyses Southward M.D.   On: 01/14/2021 12:47    Scheduled Meds:  pantoprazole (PROTONIX) IV  40 mg Intravenous Q24H   Continuous Infusions:  cefTRIAXone (ROCEPHIN)  IV 1 g (01/15/21 0817)   dextrose 50 mL/hr at 01/15/21 0813   potassium chloride 10  mEq (01/15/21 1038)     LOS: 2 days    Time spent: 30 minutes  Elige Ko, Medical Student [facility] Pager [phone]  If 7PM-7AM, please contact night-coverage [email or web address] Password [facility] 01/15/2021, 11:28 AM    ATTENDING NOTE   Patient seen and examined with Elige Ko, Medical student. In addition to supervising the encounter, I played a key role in the decision making process as well as reviewed key findings.  Pt to have EGD with botox later today with Dr. Marletta Lor.  Further recommendations to follow.  I agree with plan of care as noted.    Rodney Langton, MD FAAFP How to contact the [facility] Attending or Consulting provider 7A - 7P or covering provider during after hours 7P -7A, for this patient?  Check the care team in [facility] and look for a) attending/consulting [facility] provider listed and b) the [facility] team listed Log into [email or web address] and use [facility] universal password to access. If you do not have the password, please contact the hospital operator. Locate the [facility] provider you are looking for under [facility] and page to a number that you can be directly reached. If you still have difficulty reaching the provider, please page the [facility] (Director on Call) for the Hospitalists listed on amion for assistance.

## 2021-01-16 ENCOUNTER — Encounter (HOSPITAL_COMMUNITY): Payer: Self-pay | Admitting: Internal Medicine

## 2021-01-16 DIAGNOSIS — K220 Achalasia of cardia: Secondary | ICD-10-CM

## 2021-01-16 DIAGNOSIS — E860 Dehydration: Secondary | ICD-10-CM | POA: Diagnosis not present

## 2021-01-16 DIAGNOSIS — R1310 Dysphagia, unspecified: Secondary | ICD-10-CM | POA: Diagnosis not present

## 2021-01-16 DIAGNOSIS — R1010 Upper abdominal pain, unspecified: Secondary | ICD-10-CM | POA: Diagnosis not present

## 2021-01-16 LAB — CBC
HCT: 31.3 % — ABNORMAL LOW (ref 36.0–46.0)
Hemoglobin: 9.9 g/dL — ABNORMAL LOW (ref 12.0–15.0)
MCH: 31.7 pg (ref 26.0–34.0)
MCHC: 31.6 g/dL (ref 30.0–36.0)
MCV: 100.3 fL — ABNORMAL HIGH (ref 80.0–100.0)
Platelets: 148 10*3/uL — ABNORMAL LOW (ref 150–400)
RBC: 3.12 MIL/uL — ABNORMAL LOW (ref 3.87–5.11)
RDW: 15.5 % (ref 11.5–15.5)
WBC: 9.2 10*3/uL (ref 4.0–10.5)
nRBC: 0 % (ref 0.0–0.2)

## 2021-01-16 LAB — MAGNESIUM: Magnesium: 1.9 mg/dL (ref 1.7–2.4)

## 2021-01-16 LAB — BASIC METABOLIC PANEL
Anion gap: 5 (ref 5–15)
BUN: 14 mg/dL (ref 8–23)
CO2: 28 mmol/L (ref 22–32)
Calcium: 9 mg/dL (ref 8.9–10.3)
Chloride: 110 mmol/L (ref 98–111)
Creatinine, Ser: 0.43 mg/dL — ABNORMAL LOW (ref 0.44–1.00)
GFR, Estimated: >60 mL/min (ref >60)
Glucose, Bld: 125 mg/dL — ABNORMAL HIGH (ref 70–99)
Potassium: 3.4 mmol/L — ABNORMAL LOW (ref 3.5–5.1)
Sodium: 143 mmol/L (ref 135–145)

## 2021-01-16 LAB — PHOSPHORUS: Phosphorus: 1.6 mg/dL — ABNORMAL LOW (ref 2.5–4.6)

## 2021-01-16 MED ORDER — PANTOPRAZOLE SODIUM 40 MG PO TBEC
40.0000 mg | DELAYED_RELEASE_TABLET | Freq: Two times a day (BID) | ORAL | Status: DC
  Administered 2021-01-16 – 2021-01-24 (×15): 40 mg via ORAL
  Filled 2021-01-16 (×17): qty 1

## 2021-01-16 MED ORDER — MORPHINE SULFATE (PF) 2 MG/ML IV SOLN
0.5000 mg | INTRAVENOUS | Status: DC | PRN
  Administered 2021-01-17 – 2021-02-01 (×23): 0.5 mg via INTRAVENOUS
  Filled 2021-01-16 (×23): qty 1

## 2021-01-16 MED ORDER — POTASSIUM PHOSPHATES 15 MMOLE/5ML IV SOLN
20.0000 mmol | Freq: Once | INTRAVENOUS | Status: AC
  Administered 2021-01-16: 20 mmol via INTRAVENOUS
  Filled 2021-01-16: qty 6.67

## 2021-01-16 NOTE — Progress Notes (Signed)
SLP Cancellation Note  Patient Details Name: Kathryn [name] MRN: [id number] DOB: September 13, 1940   Cancelled treatment:       Reason Eval/Treat Not Completed: Patient pleasantly declined PO trials; Pt reports ice chips and even small sips of water are "coming back" after swallowing. Observed Pt using oral suction to clear regurgitated ice chips and frothy white residue during education and discussion with family. ST will continue efforts, thank you  Ashaunte Standley H. Romie Levee, CCC-SLP Speech Language Pathologist    Georgetta Haber 01/16/2021, 3:30 PM

## 2021-01-16 NOTE — Progress Notes (Signed)
PROGRESS NOTE    Kathryn [name]  RCV:[id number] DOB: Apr 25, 1941 DOA: 01/13/2021 PCP: Suzan Slick, MD  Outpatient Specialists: Rheumatology (first appointment 01/26/21)   Brief Narrative:   Kathryn [name] is a 80 y.o. female with medical history significant for  hypertension, type 2 diabetes mellitus, GERD, positive ANA and SSA autoantibodies, and chronic achalasia (2014 manometry showed abnormal motility)  who presents to the emergency department due to more than 2 months history of decreased oral intake, generalized weakness and about 3 to 4-week onset of abdominal pain associated with difficulty in being able to swallow (solid and liquid).  She believes that she must have lost up to 30 pounds since onset of symptoms.  Patient endorsed that she usually regurgitates any food/liquid ingested within 15 to 20 minutes of ingestion.  Abdominal pain was in the epigastric area and was sharp in nature, this worsens with food.  She denies fever, chills, chest pain, shortness of breath.   She was admitted to the hospital from 12/25/2020-12/28/2020, at that time barium p.o. esophagram done urgently as an outpatient was indicated.  She was also admitted from 10/23/2020 to 10/25/2020 during which patient presented with proximal muscle weakness with an elevated CPK of 1206 that was concerning for polymyalgia rheumatica. This was treated with prednisone with improvement.  EGD done on 10/04/2020 showed normal dilated esophagus and small hiatal hernia.   In the emergency department, she was intermittently tachypneic and tachycardic.  Other vital signs were within normal range.  Work-up in the ED showed normocytic anemia and leukocytosis, BMP was normal except for elevated BUN at and hyperglycemia with CBG of 135.  Albumin 3.2. CT of head without contrast showed no acute intracranial abnormality. IV hydration was provided, hospitalist was asked to admit patient for further evaluation and management.   7/20 - Patient  still experiences significant epigastric pain. Using suction for oral secretion. No other acute complaints. GI (Dr. Karilyn Cota) was consulted and will provide further recommendations. Of note, patient has not been able to follow up with outpatient rheumatology and has not been receiving treatments    7/21 - Patient is clinically stable. EGD with botox treatment was successfully completed.   7/22 - Patient continues to have difficulty swallowing.  Began discussion with patient regarding possible need for temporary PEG placement if needed.    Assessment & Plan:   Principal Problem:   Abdominal pain Active Problems:   HTN (hypertension)   Dysphagia   Nausea and vomiting   Generalized weakness   Dehydration   Normocytic anemia   Leukocytosis   Hyperglycemia   Hypoalbuminemia due to protein-calorie malnutrition (HCC)   Prolonged QT interval   Achalasia   Malnutrition of moderate degree  Epigastric/Abdominal pain, nausea and vomiting in the setting of esophageal dysphagia Patient is ANA/SSA antibody positive. Sjogren's syndrome and SLE are on the differential. Patient also has had increased oral secretion, suggestive of sicca-related symptoms. Gastrointestinal involvement of SLE can manifest as esophageal motility disorder, such as achalasia, which has been suggesting in this patient. Other GI manifestations of SLE include acute pancreatitis, for which  patient signs and symptoms are concerning, but CT 6/30 has shown no acute pancreatic findings. EGD in April 2022 showed unremarkable esophagus, and patient underwent dialtion given history of dysphagia, but patient states that did not help with symptoms.   -Continue IV morphine 0.5 mg q.4h p.r.n. for severe pain -Continue Protonix 40 mg po BID -Continue clear liquid diet with plan to advanced diet as tolerated -  Gastroenterology following for further recommendations -ongoing goals of care discussions with palliative team, patient and daughter  regarding tube feeding   Generalized weakness and dehydration in the setting of above -Hypoalbuminemia secondary to mild protein calorie malnutrition -Continue IV hydration as described above -Continue protein supplement   Urinary tract infection - UA in the ED showed many bacteria, moderate ketones, nitrites -Patient is on ceftriaxone 1g for coverage (to be d/c 7/23)  Normocytic anemia -Chronic and Stable  Leukocytosis/hyperglycemia possibly reactive - WBC normalized to 10 this AM - Continue to monitor WBC and blood glucose with morning labs   Prolonged QTc (RESOLVED) - Avoid QT prolonging drugs - Magnesium 1.9 - Repeated EKG with resolution of prolonged QTc    COVID-19 infection (incidental finding) -Patient is asymptomatic and stable on room air -This was already diagnosed during last admission -Continue airborne and contact precautions   Essential hypertension -Continue Lopressor  Hyperglycemia secondary to T2DM -No medication noted on med rec -Hemoglobin A1c on 6/30-5.8   GERD -Continue Protonix    DVT prophylaxis: SCD Code Status: Full Family Communication: Phone contact to daughter Winnifred (who was at bedside) Disposition Plan: SNF or family member's home   Consultants:  Gastroenterology (Dr. Jena Gauss) Palliative care   Procedures:  Barium swallow 7/20 EGD with botox injection 7/21  Consultants:  GI  Procedures:  EGD 7/21  Subjective: Pt reports that she cannot swallow   Objective: Vitals:   01/15/21 2000 01/15/21 2313 01/16/21 0011 01/16/21 0326  BP: (!) 152/82 (!) 148/82  135/74  Pulse: (!) 102 (!) 110  95  Resp: 19 18  18   Temp: 98.1 F (36.7 C) 98 F (36.7 C) 98.2 F (36.8 C) 98 F (36.7 C)  TempSrc:  Oral Oral   SpO2: 100% 99%  100%  Weight:      Height:        Intake/Output Summary (Last 24 hours) at 01/16/2021 1100 Last data filed at 01/16/2021 0900 Gross per 24 hour  Intake 864.72 ml  Output 200 ml  Net 664.72 ml   Filed  Weights   01/13/21 1909  Weight: 54.4 kg   Examination:  General exam: Appears calm and comfortable  Respiratory system: Clear to auscultation. Respiratory effort normal. Cardiovascular system: S1 & S2 heard.  No JVD, murmurs, rubs, gallops or clicks. No pedal edema. Gastrointestinal system: Abdomen is nondistended, soft and nontender. No organomegaly or masses felt. Normal bowel sounds heard. Central nervous system: Alert and oriented. No focal neurological deficits. Extremities: Symmetric 5 x 5 power. Skin: No rashes, lesions or ulcers Psychiatry: Judgement and insight appear normal. Mood & affect appropriate.   Data Reviewed: I have personally reviewed following labs and imaging studies  CBC: Recent Labs  Lab 01/13/21 2014 01/14/21 0409 01/15/21 0350 01/16/21 0445  WBC 11.5* 10.2 10.2 9.2  NEUTROABS 10.3*  --   --   --   HGB 11.2* 11.1* 10.6* 9.9*  HCT 34.7* 35.1* 33.8* 31.3*  MCV 98.6 99.7 100.6* 100.3*  PLT 171 166 165 148*   Basic Metabolic Panel: Recent Labs  Lab 01/13/21 2014 01/14/21 0409 01/15/21 0350 01/15/21 0712 01/16/21 0445  NA 140 143 146*  --  143  K 3.6 3.5 3.0*  --  3.4*  CL 100 105 109  --  110  CO2 27 28 26   --  28  GLUCOSE 135* 105* 97  --  125*  BUN 30* 28* 21  --  14  CREATININE 0.75 0.64 0.56  --  0.43*  CALCIUM 9.3 9.3 9.0  --  9.0  MG  --   --   --  2.0 1.9  PHOS  --   --   --   --  1.6*   GFR: Estimated Creatinine Clearance: 45.6 mL/min (A) (by C-G formula based on SCr of 0.43 mg/dL (L)). Liver Function Tests: Recent Labs  Lab 01/13/21 2014 01/14/21 0409  AST 27 23  ALT 15 14  ALKPHOS 54 55  BILITOT 1.0 0.7  PROT 8.0 7.7  ALBUMIN 3.2* 3.1*   Recent Labs  Lab 01/13/21 2014  LIPASE 37   No results for input(s): AMMONIA in the last 168 hours. Coagulation Profile: Recent Labs  Lab 01/14/21 0409  INR 1.0   Cardiac Enzymes: No results for input(s): CKTOTAL, CKMB, CKMBINDEX, TROPONINI in the last 168 hours. BNP (last 3  results) No results for input(s): PROBNP in the last 8760 hours. HbA1C: No results for input(s): HGBA1C in the last 72 hours. CBG: Recent Labs  Lab 01/15/21 1334 01/15/21 1507  GLUCAP 112* 95   Lipid Profile: No results for input(s): CHOL, HDL, LDLCALC, TRIG, CHOLHDL, LDLDIRECT in the last 72 hours. Thyroid Function Tests: No results for input(s): TSH, T4TOTAL, FREET4, T3FREE, THYROIDAB in the last 72 hours. Anemia Panel: No results for input(s): VITAMINB12, FOLATE, FERRITIN, TIBC, IRON, RETICCTPCT in the last 72 hours. Urine analysis:    Component Value Date/Time   COLORURINE AMBER (A) 01/13/2021 2309   APPEARANCEUR CLOUDY (A) 01/13/2021 2309   LABSPEC 1.025 01/13/2021 2309   PHURINE 5.0 01/13/2021 2309   GLUCOSEU NEGATIVE 01/13/2021 2309   HGBUR SMALL (A) 01/13/2021 2309   BILIRUBINUR NEGATIVE 01/13/2021 2309   KETONESUR 20 (A) 01/13/2021 2309   PROTEINUR 100 (A) 01/13/2021 2309   NITRITE POSITIVE (A) 01/13/2021 2309   LEUKOCYTESUR MODERATE (A) 01/13/2021 2309   )No results found for this or any previous visit (from the past 240 hour(s)).  Radiology Studies: DG ESOPHAGUS W DOUBLE CM (HD)  Result Date: 01/14/2021 CLINICAL DATA:  Achalasia by prior esophageal manometry, dysphagia, regurgitation EXAM: ESOPHOGRAM / BARIUM SWALLOW / BARIUM TABLET STUDY TECHNIQUE: Combined double contrast and single contrast examination performed using effervescent crystals, thick barium liquid, and thin barium liquid. 12.5 mm diameter barium tablet was not administered. FLUOROSCOPY TIME:  Fluoroscopy Time:  3 minutes 18 seconds Radiation Exposure Index (if provided by the fluoroscopic device): 55.3 mGy Number of Acquired Spot Images: multiple fluoroscopic screen captures COMPARISON:  None FINDINGS: Patient unable to safely stand for upright imaging. Study performed horizontal up to 40 degrees of head elevation. Thoracic esophagus appears dilated with relative narrowing of the gastroesophageal  junction. Extremely poor esophageal motility with very weak to absent primary peristaltic waves, occasional secondary waves, and multiple non propulsive tertiary waves. Prolonged retention of contrast within thoracic esophagus despite elevation of table 40 degrees. Questionable linear filling defect at distal esophagus. Suspected ulcer along LEFT anterolateral aspect of the distal esophagus (RF#3 image 182/199 and RF#4 image 157/157)). No definite hiatal hernia. Focal area of narrowing at the cervical esophagus as well, though this could be relative related to distention of remainder of esophagus. No definite aspiration of contrast. At AP window, a potential small diverticulum or less likely additional ulcer is seen, RF#5 image109/127, RF#1 image 3/85. IMPRESSION: Dilated thoracic esophagus with extremely poor motility and relative narrowing of the GE junction, suggestive of achalasia. Probable ulcer at distal esophagus with additional diverticulum versus less likely ulcer at the level of the AP window.  Questionable linear filling defect at distal esophagus such as a web or related to an ulcer. Electronically Signed   By: Ulyses Southward M.D.   On: 01/14/2021 12:47    Scheduled Meds:  pantoprazole  40 mg Oral BID   Continuous Infusions:  potassium PHOSPHATE IVPB (in mmol)       LOS: 3 days   Time spent: 36 minutes  Hartman Minahan, MD [facility] If 7PM-7AM, please contact night-coverage [email or web address] Password [email or web address] 01/16/2021, 11:00 AM

## 2021-01-16 NOTE — Progress Notes (Signed)
Subjective:  Trying to drink liquid diet. Going slow. During interview, does have some liquid come back up and she suctions out of her mouth. No pain. Discussed regarding feeding tube. Per palliative, daughter says patient would not be interested. Patient states that she has not ruled that out as an option yet.   Objective: Vital signs in last 24 hours: Temp:  [97.8 F (36.6 C)-98.2 F (36.8 C)] 98 F (36.7 C) (07/22 0326) Pulse Rate:  [95-112] 95 (07/22 0326) Resp:  [13-24] 18 (07/22 0326) BP: (135-154)/(69-82) 135/74 (07/22 0326) SpO2:  [96 %-100 %] 100 % (07/22 0326) Last BM Date: 01/11/21 General:   Alert,  Well-developed, well-nourished, pleasant and cooperative in NAD Head:  Normocephalic and atraumatic. Eyes:  Sclera clear, no icterus.  Abdomen:  Soft, nontender and nondistended.  Normal bowel sounds, without guarding, and without rebound.   Extremities:  Without clubbing, deformity or edema. Neurologic:  Alert and  oriented x4;  grossly normal neurologically. Psych:  Alert and cooperative. Normal mood and affect.  Intake/Output from previous day: 07/21 0701 - 07/22 0700 In: 624.7 [I.V.:524.7; IV Piggyback:100] Out: 200 [Urine:200] Intake/Output this shift: No intake/output data recorded.  Lab Results: CBC Recent Labs    01/14/21 0409 01/15/21 0350 01/16/21 0445  WBC 10.2 10.2 9.2  HGB 11.1* 10.6* 9.9*  HCT 35.1* 33.8* 31.3*  MCV 99.7 100.6* 100.3*  PLT 166 165 148*   BMET Recent Labs    01/14/21 0409 01/15/21 0350 01/16/21 0445  NA 143 146* 143  K 3.5 3.0* 3.4*  CL 105 109 110  CO2 28 26 28   GLUCOSE 105* 97 125*  BUN 28* 21 14  CREATININE 0.64 0.56 0.43*  CALCIUM 9.3 9.0 9.0   LFTs Recent Labs    01/13/21 2014 01/14/21 0409  BILITOT 1.0 0.7  ALKPHOS 54 55  AST 27 23  ALT 15 14  PROT 8.0 7.7  ALBUMIN 3.2* 3.1*   Recent Labs    01/13/21 2014  LIPASE 37   PT/INR Recent Labs    01/14/21 0409  LABPROT 13.0  INR 1.0      Imaging  Studies: CT Head Wo Contrast  Result Date: 01/13/2021 CLINICAL DATA:  Delirium. EXAM: CT HEAD WITHOUT CONTRAST TECHNIQUE: Contiguous axial images were obtained from the base of the skull through the vertex without intravenous contrast. COMPARISON:  None. FINDINGS: Brain: No evidence of acute infarction, hemorrhage, hydrocephalus, extra-axial collection or mass lesion/mass effect. Vascular: No hyperdense vessel or unexpected calcification. Skull: Normal. Negative for fracture or focal lesion. Sinuses/Orbits: No acute finding. Other: None. IMPRESSION: No acute intracranial abnormality seen. Electronically Signed   By: 01/15/2021 M.D.   On: 01/13/2021 20:34   CT ABDOMEN PELVIS W CONTRAST  Result Date: 12/25/2020 CLINICAL DATA:  Weakness, possible infection. EXAM: CT ABDOMEN AND PELVIS WITH CONTRAST TECHNIQUE: Multidetector CT imaging of the abdomen and pelvis was performed using the standard protocol following bolus administration of intravenous contrast. CONTRAST:  12/27/2020 OMNIPAQUE IOHEXOL 300 MG/ML  SOLN COMPARISON:  December 11, 2020. FINDINGS: Lower chest: No acute abnormality. Hepatobiliary: No focal liver abnormality is seen. Status post cholecystectomy. No biliary dilatation. Pancreas: Unremarkable. No pancreatic ductal dilatation or surrounding inflammatory changes. Spleen: Normal in size without focal abnormality. Adrenals/Urinary Tract: Adrenal glands appear normal. Left renal cyst is noted. No hydronephrosis or renal obstruction is noted. Urinary bladder is unremarkable. Stomach/Bowel: The stomach and appendix appear normal. There is no evidence of bowel obstruction. Mild diffuse wall thickening is seen involving  the cecum concerning for infectious or inflammatory colitis. Vascular/Lymphatic: Aortic atherosclerosis. No enlarged abdominal or pelvic lymph nodes. Reproductive: Status post hysterectomy. No adnexal masses. Other: No abdominal wall hernia or abnormality. No abdominopelvic ascites.  Musculoskeletal: No acute or significant osseous findings. IMPRESSION: Mild diffuse wall thickening is seen involving the cecum concerning for infectious or inflammatory colitis. Aortic Atherosclerosis (ICD10-I70.0). Electronically Signed   By: Lupita Raider M.D.   On: 12/25/2020 17:38   DG Chest Port 1 View  Result Date: 12/25/2020 CLINICAL DATA:  Shortness of breath and cough. EXAM: PORTABLE CHEST 1 VIEW COMPARISON:  December 04, 2020 FINDINGS: The lungs are hyperinflated. There is no evidence of acute infiltrate, pleural effusion or pneumothorax. The heart size and mediastinal contours are within normal limits. There is marked severity calcification of the aortic arch. Multilevel degenerative changes seen throughout the thoracic spine. IMPRESSION: No active cardiopulmonary disease. Electronically Signed   By: Aram Candela M.D.   On: 12/25/2020 16:25   DG ESOPHAGUS W DOUBLE CM (HD)  Result Date: 01/14/2021 CLINICAL DATA:  Achalasia by prior esophageal manometry, dysphagia, regurgitation EXAM: ESOPHOGRAM / BARIUM SWALLOW / BARIUM TABLET STUDY TECHNIQUE: Combined double contrast and single contrast examination performed using effervescent crystals, thick barium liquid, and thin barium liquid. 12.5 mm diameter barium tablet was not administered. FLUOROSCOPY TIME:  Fluoroscopy Time:  3 minutes 18 seconds Radiation Exposure Index (if provided by the fluoroscopic device): 55.3 mGy Number of Acquired Spot Images: multiple fluoroscopic screen captures COMPARISON:  None FINDINGS: Patient unable to safely stand for upright imaging. Study performed horizontal up to 40 degrees of head elevation. Thoracic esophagus appears dilated with relative narrowing of the gastroesophageal junction. Extremely poor esophageal motility with very weak to absent primary peristaltic waves, occasional secondary waves, and multiple non propulsive tertiary waves. Prolonged retention of contrast within thoracic esophagus despite  elevation of table 40 degrees. Questionable linear filling defect at distal esophagus. Suspected ulcer along LEFT anterolateral aspect of the distal esophagus (RF#3 image 182/199 and RF#4 image 157/157)). No definite hiatal hernia. Focal area of narrowing at the cervical esophagus as well, though this could be relative related to distention of remainder of esophagus. No definite aspiration of contrast. At AP window, a potential small diverticulum or less likely additional ulcer is seen, RF#5 image109/127, RF#1 image 3/85. IMPRESSION: Dilated thoracic esophagus with extremely poor motility and relative narrowing of the GE junction, suggestive of achalasia. Probable ulcer at distal esophagus with additional diverticulum versus less likely ulcer at the level of the AP window. Questionable linear filling defect at distal esophagus such as a web or related to an ulcer. Electronically Signed   By: Ulyses Southward M.D.   On: 01/14/2021 12:47  [2 weeks]   Assessment: 80 year old female with ongoing diminished oral intake due to dysphagia, regurgitation, vomiting.Personal history of abnormal manometry in 2014, diagnosed with achalasia.  Patient denies undergoing surgical management or Botox injections.  She had an EGD in April 2022 during hospitalization.  Esophagus appeared fairly unremarkable.  She did undergo esophageal dilation but states that she had no noted improvement in her symptoms.  Symptoms have not progressed over the past couple of months, worse the last couple of weeks.  Within 15 to 20 minutes of trying to eat or drink anything, she regurgitates and/or vomits.  Suspected to have esophageal motility disorder as cause of the symptoms.     Also recently positive ANA and positive SSA (Ro) antibody.  Rheumatology consultation is pending.  Sjogren's syndrome  could potentially be linked to this particular antibody. Unclear if playing a role in her GI symptoms at this time. Interestingly she was also treated with  prednisone for possible polymyalgia rheumatica back in April.  Those symptoms have improved.  EGD completed July 21:Fluid was found in the middle third of the esophagus and in the lower third of the esophagus. A hypertonic lower esophageal sphincter was found. There was mild resistance to endoscope advancement into the stomach. The gastroesophageal junction and cardia were normal on retroflexed view. Area was successfully injected with 100 units botulinum toxin. Bilious fluid was found in the entire examined stomach. Approx. 900 cc was suctioned out. Diffuse moderate inflammation characterized by congestion (edema) and erythema was found in the entire examined stomach.  Will have to clinically follow to see if botox will make any improvement in her swallowing. She had stomach filled with fluid yesterday even though she had been NPO. ?delayed gastric emptying. Query underlying scleroderma playing a role. Rheumatology consult pending from 09/2020.    Plan: PPI twice daily. Monitor for swallowing function for signs of improvement post botox injection.  May need to consider promotility medication for possible gastroparesis as well. Will continue to monitor.  Of note, patient states she has not ruled out option of feeding tube if it ever became and issue.    Leanna Battles. Dixon Boos [facility] [phone] 7/22/202211:04 AM    LOS: 3 days

## 2021-01-17 DIAGNOSIS — K220 Achalasia of cardia: Secondary | ICD-10-CM | POA: Diagnosis not present

## 2021-01-17 DIAGNOSIS — R1310 Dysphagia, unspecified: Secondary | ICD-10-CM | POA: Diagnosis not present

## 2021-01-17 DIAGNOSIS — E860 Dehydration: Secondary | ICD-10-CM

## 2021-01-17 DIAGNOSIS — R1010 Upper abdominal pain, unspecified: Secondary | ICD-10-CM | POA: Diagnosis not present

## 2021-01-17 LAB — CBC
HCT: 32.2 % — ABNORMAL LOW (ref 36.0–46.0)
Hemoglobin: 10.7 g/dL — ABNORMAL LOW (ref 12.0–15.0)
MCH: 33 pg (ref 26.0–34.0)
MCHC: 33.2 g/dL (ref 30.0–36.0)
MCV: 99.4 fL (ref 80.0–100.0)
Platelets: 143 10*3/uL — ABNORMAL LOW (ref 150–400)
RBC: 3.24 MIL/uL — ABNORMAL LOW (ref 3.87–5.11)
RDW: 14.8 % (ref 11.5–15.5)
WBC: 8.5 10*3/uL (ref 4.0–10.5)
nRBC: 0 % (ref 0.0–0.2)

## 2021-01-17 LAB — BASIC METABOLIC PANEL
Anion gap: 5 (ref 5–15)
BUN: 10 mg/dL (ref 8–23)
CO2: 29 mmol/L (ref 22–32)
Calcium: 8.6 mg/dL — ABNORMAL LOW (ref 8.9–10.3)
Chloride: 104 mmol/L (ref 98–111)
Creatinine, Ser: 0.46 mg/dL (ref 0.44–1.00)
GFR, Estimated: >60 mL/min (ref >60)
Glucose, Bld: 91 mg/dL (ref 70–99)
Potassium: 3.3 mmol/L — ABNORMAL LOW (ref 3.5–5.1)
Sodium: 138 mmol/L (ref 135–145)

## 2021-01-17 MED ORDER — SCOPOLAMINE 1 MG/3DAYS TD PT72
1.0000 | MEDICATED_PATCH | TRANSDERMAL | Status: DC
  Administered 2021-01-17 – 2021-02-04 (×7): 1.5 mg via TRANSDERMAL
  Filled 2021-01-17 (×8): qty 1

## 2021-01-17 MED ORDER — POTASSIUM CHLORIDE 10 MEQ/100ML IV SOLN
10.0000 meq | INTRAVENOUS | Status: AC
  Administered 2021-01-17 (×4): 10 meq via INTRAVENOUS
  Filled 2021-01-17 (×4): qty 100

## 2021-01-17 MED ORDER — METOPROLOL TARTRATE 25 MG PO TABS
25.0000 mg | ORAL_TABLET | Freq: Two times a day (BID) | ORAL | Status: DC
  Administered 2021-01-17 – 2021-01-18 (×2): 25 mg via ORAL
  Filled 2021-01-17 (×2): qty 1

## 2021-01-17 NOTE — Progress Notes (Signed)
PROGRESS NOTE    Kathryn [name]  RCV:[id number] DOB: Apr 25, 1941 DOA: 01/13/2021 PCP: Suzan Slick, MD  Outpatient Specialists: Rheumatology (first appointment 01/26/21)   Brief Narrative:   Kathryn [name] is a 80 y.o. female with medical history significant for  hypertension, type 2 diabetes mellitus, GERD, positive ANA and SSA autoantibodies, and chronic achalasia (2014 manometry showed abnormal motility)  who presents to the emergency department due to more than 2 months history of decreased oral intake, generalized weakness and about 3 to 4-week onset of abdominal pain associated with difficulty in being able to swallow (solid and liquid).  She believes that she must have lost up to 30 pounds since onset of symptoms.  Patient endorsed that she usually regurgitates any food/liquid ingested within 15 to 20 minutes of ingestion.  Abdominal pain was in the epigastric area and was sharp in nature, this worsens with food.  She denies fever, chills, chest pain, shortness of breath.   She was admitted to the hospital from 12/25/2020-12/28/2020, at that time barium p.o. esophagram done urgently as an outpatient was indicated.  She was also admitted from 10/23/2020 to 10/25/2020 during which patient presented with proximal muscle weakness with an elevated CPK of 1206 that was concerning for polymyalgia rheumatica. This was treated with prednisone with improvement.  EGD done on 10/04/2020 showed normal dilated esophagus and small hiatal hernia.   In the emergency department, she was intermittently tachypneic and tachycardic.  Other vital signs were within normal range.  Work-up in the ED showed normocytic anemia and leukocytosis, BMP was normal except for elevated BUN at and hyperglycemia with CBG of 135.  Albumin 3.2. CT of head without contrast showed no acute intracranial abnormality. IV hydration was provided, hospitalist was asked to admit patient for further evaluation and management.   7/20 - Patient  still experiences significant epigastric pain. Using suction for oral secretion. No other acute complaints. GI (Dr. Karilyn Cota) was consulted and will provide further recommendations. Of note, patient has not been able to follow up with outpatient rheumatology and has not been receiving treatments    7/21 - Patient is clinically stable. EGD with botox treatment was successfully completed.   7/22 - Patient continues to have difficulty swallowing.  Began discussion with patient regarding possible need for temporary PEG placement if needed.    Assessment & Plan:   Principal Problem:   Abdominal pain Active Problems:   HTN (hypertension)   Dysphagia   Nausea and vomiting   Generalized weakness   Dehydration   Normocytic anemia   Leukocytosis   Hyperglycemia   Hypoalbuminemia due to protein-calorie malnutrition (HCC)   Prolonged QT interval   Achalasia   Malnutrition of moderate degree  Epigastric/Abdominal pain, nausea and vomiting in the setting of esophageal dysphagia Patient is ANA/SSA antibody positive. Sjogren's syndrome and SLE are on the differential. Patient also has had increased oral secretion, suggestive of sicca-related symptoms. Gastrointestinal involvement of SLE can manifest as esophageal motility disorder, such as achalasia, which has been suggesting in this patient. Other GI manifestations of SLE include acute pancreatitis, for which  patient signs and symptoms are concerning, but CT 6/30 has shown no acute pancreatic findings. EGD in April 2022 showed unremarkable esophagus, and patient underwent dialtion given history of dysphagia, but patient states that did not help with symptoms.   -Continue IV morphine 0.5 mg q.4h p.r.n. for severe pain -Continue Protonix 40 mg po BID -Continue clear liquid diet with plan to advanced diet as tolerated -  Gastroenterology following for further recommendations -ongoing goals of care discussions with palliative team, patient and daughter  regarding tube feeding -Pt seems to be amenable to PEG placement if absolutely necessary   Generalized weakness and dehydration in the setting of above -Hypoalbuminemia secondary to mild protein calorie malnutrition -Continue IV hydration as described above -Continue protein supplement   Urinary tract infection - UA in the ED showed many bacteria, moderate ketones, nitrites -Patient is on ceftriaxone 1g for coverage (to be d/c 7/23)  Normocytic anemia -Chronic and Stable  Leukocytosis/hyperglycemia possibly reactive - WBC normalized to 10 this AM - Continue to monitor WBC and blood glucose with morning labs   Prolonged QTc (RESOLVED) - Avoid QT prolonging drugs - Magnesium 1.9 - Repeated EKG with resolution of prolonged QTc    COVID-19 infection (incidental finding) -Patient is asymptomatic and stable on room air -This was already diagnosed during last admission -Continue airborne and contact precautions   Essential hypertension -Continue Lopressor  Hyperglycemia secondary to T2DM -No medication noted on med rec -Hemoglobin A1c on 6/30-5.8   GERD -Continue Protonix    DVT prophylaxis: SCD Code Status: Full Family Communication: Phone contact to daughter Winnifred Disposition Plan: SNF or family member's home   Consultants:  Gastroenterology (Dr. Jena Gauss) Palliative care   Procedures:  Barium swallow 7/20 EGD with botox injection 7/21  Consultants:  GI  Subjective: Pt reports that she was able to take small sips of liquid which is an improvement   Objective: Vitals:   01/16/21 1229 01/16/21 2123 01/17/21 0454 01/17/21 1042  BP: (!) 148/86 138/72 137/80 129/73  Pulse: (!) 101 99 100 97  Resp: 17 20 17    Temp: 98.3 F (36.8 C) 98.7 F (37.1 C) 98.6 F (37 C) 98.4 F (36.9 C)  TempSrc: Oral Oral  Axillary  SpO2: 100% 100% 100% 100%  Weight:      Height:        Intake/Output Summary (Last 24 hours) at 01/17/2021 1120 Last data filed at 01/16/2021  2000 Gross per 24 hour  Intake 240 ml  Output --  Net 240 ml   Filed Weights   01/13/21 1909  Weight: 54.4 kg   Examination:  General exam: frail, emaciated appearing female, Appears calm and comfortable  Respiratory system: Clear to auscultation. Respiratory effort normal. Cardiovascular system: S1 & S2 heard.  No JVD, murmurs, rubs, gallops or clicks. No pedal edema. Gastrointestinal system: Abdomen is nondistended, soft and nontender. No organomegaly or masses felt. Normal bowel sounds heard. Central nervous system: Alert and oriented. No focal neurological deficits. Extremities: Symmetric 5 x 5 power. Skin: No rashes, lesions or ulcers Psychiatry: Judgement and insight appear diminished. Mood & affect flat.   Data Reviewed: I have personally reviewed following labs and imaging studies  CBC: Recent Labs  Lab 01/13/21 2014 01/14/21 0409 01/15/21 0350 01/16/21 0445 01/17/21 0548  WBC 11.5* 10.2 10.2 9.2 8.5  NEUTROABS 10.3*  --   --   --   --   HGB 11.2* 11.1* 10.6* 9.9* 10.7*  HCT 34.7* 35.1* 33.8* 31.3* 32.2*  MCV 98.6 99.7 100.6* 100.3* 99.4  PLT 171 166 165 148* 143*   Basic Metabolic Panel: Recent Labs  Lab 01/13/21 2014 01/14/21 0409 01/15/21 0350 01/15/21 0712 01/16/21 0445 01/17/21 0548  NA 140 143 146*  --  143 138  K 3.6 3.5 3.0*  --  3.4* 3.3*  CL 100 105 109  --  110 104  CO2 27 28 26   --  28 29  GLUCOSE 135* 105* 97  --  125* 91  BUN 30* 28* 21  --  14 10  CREATININE 0.75 0.64 0.56  --  0.43* 0.46  CALCIUM 9.3 9.3 9.0  --  9.0 8.6*  MG  --   --   --  2.0 1.9  --   PHOS  --   --   --   --  1.6*  --    GFR: Estimated Creatinine Clearance: 45.6 mL/min (by C-G formula based on SCr of 0.46 mg/dL). Liver Function Tests: Recent Labs  Lab 01/13/21 2014 01/14/21 0409  AST 27 23  ALT 15 14  ALKPHOS 54 55  BILITOT 1.0 0.7  PROT 8.0 7.7  ALBUMIN 3.2* 3.1*   Recent Labs  Lab 01/13/21 2014  LIPASE 37   No results for input(s): AMMONIA in  the last 168 hours. Coagulation Profile: Recent Labs  Lab 01/14/21 0409  INR 1.0   Cardiac Enzymes: No results for input(s): CKTOTAL, CKMB, CKMBINDEX, TROPONINI in the last 168 hours. BNP (last 3 results) No results for input(s): PROBNP in the last 8760 hours. HbA1C: No results for input(s): HGBA1C in the last 72 hours. CBG: Recent Labs  Lab 01/15/21 1334 01/15/21 1507  GLUCAP 112* 95   Lipid Profile: No results for input(s): CHOL, HDL, LDLCALC, TRIG, CHOLHDL, LDLDIRECT in the last 72 hours. Thyroid Function Tests: No results for input(s): TSH, T4TOTAL, FREET4, T3FREE, THYROIDAB in the last 72 hours. Anemia Panel: No results for input(s): VITAMINB12, FOLATE, FERRITIN, TIBC, IRON, RETICCTPCT in the last 72 hours. Urine analysis:    Component Value Date/Time   COLORURINE AMBER (A) 01/13/2021 2309   APPEARANCEUR CLOUDY (A) 01/13/2021 2309   LABSPEC 1.025 01/13/2021 2309   PHURINE 5.0 01/13/2021 2309   GLUCOSEU NEGATIVE 01/13/2021 2309   HGBUR SMALL (A) 01/13/2021 2309   BILIRUBINUR NEGATIVE 01/13/2021 2309   KETONESUR 20 (A) 01/13/2021 2309   PROTEINUR 100 (A) 01/13/2021 2309   NITRITE POSITIVE (A) 01/13/2021 2309   LEUKOCYTESUR MODERATE (A) 01/13/2021 2309   )No results found for this or any previous visit (from the past 240 hour(s)).  Radiology Studies: No results found.  Scheduled Meds:  pantoprazole  40 mg Oral BID   Continuous Infusions:  potassium chloride 10 mEq (01/17/21 1039)     LOS: 4 days   Time spent: 35 minutes  Denicia Pagliarulo Laural Benes, MD [facility] If 7PM-7AM, please contact night-coverage [email or web address] Password [facility] 01/17/2021, 11:20 AM

## 2021-01-17 NOTE — Progress Notes (Signed)
Maylon Peppers, M.D. Gastroenterology & Hepatology   Interval History:  Patient reports that she has felt better today, states that she has required to use less of than the oral suction.  Has tried to drink some liquids today.  Denies having any vomiting episodes today but had 1 yesterday. Based on report of oral intake, she tolerated 480 cc of liquid diet with very scant drainage. Denies any abdominal pain, nausea, fever, chills, abdominal distention.  Has not moved her bowels yet.  Inpatient Medications:  Current Facility-Administered Medications:    morphine 2 MG/ML injection 0.5 mg, 0.5 mg, Intravenous, Q4H PRN, Johnson, Clanford L, MD, 0.5 mg at 01/17/21 0301   pantoprazole (PROTONIX) EC tablet 40 mg, 40 mg, Oral, BID, Johnson, Clanford L, MD, 40 mg at 01/17/21 1025   potassium chloride 10 mEq in 100 mL IVPB, 10 mEq, Intravenous, Q1 Hr x 4, Johnson, Clanford L, MD, Last Rate: 100 mL/hr at 01/17/21 1201, 10 mEq at 01/17/21 1201   prochlorperazine (COMPAZINE) injection 10 mg, 10 mg, Intravenous, Q6H PRN, Adefeso, Oladapo, DO   I/O    Intake/Output Summary (Last 24 hours) at 01/17/2021 1214 Last data filed at 01/16/2021 2000 Gross per 24 hour  Intake 240 ml  Output --  Net 240 ml     Physical Exam: Temp:  [98.3 F (36.8 C)-98.7 F (37.1 C)] 98.4 F (36.9 C) (07/23 1042) Pulse Rate:  [97-101] 97 (07/23 1042) Resp:  [17-20] 17 (07/23 0454) BP: (129-148)/(72-86) 129/73 (07/23 1042) SpO2:  [100 %] 100 % (07/23 1042)  Temp (24hrs), Avg:98.5 F (36.9 C), Min:98.3 F (36.8 C), Max:98.7 F (37.1 C)  GENERAL: The patient is AO x3, in no acute distress. Elder, frail. HEENT: Head is normocephalic and atraumatic. EOMI are intact. Mouth is well hydrated and without lesions. NECK: Supple. No masses LUNGS: Clear to auscultation. No presence of rhonchi/wheezing/rales. Adequate chest expansion HEART: RRR, normal s1 and s2. ABDOMEN: Soft, nontender, no guarding, no peritoneal signs, and  nondistended. BS +. No masses. EXTREMITIES: Without any cyanosis, clubbing, rash, lesions or edema. NEUROLOGIC: AOx3, no focal motor deficit. SKIN: no jaundice, no rashes  Laboratory Data: CBC:     Component Value Date/Time   WBC 8.5 01/17/2021 0548   RBC 3.24 (L) 01/17/2021 0548   HGB 10.7 (L) 01/17/2021 0548   HCT 32.2 (L) 01/17/2021 0548   PLT 143 (L) 01/17/2021 0548   MCV 99.4 01/17/2021 0548   MCH 33.0 01/17/2021 0548   MCHC 33.2 01/17/2021 0548   RDW 14.8 01/17/2021 0548   LYMPHSABS 0.5 (L) 01/13/2021 2014   MONOABS 0.7 01/13/2021 2014   EOSABS 0.0 01/13/2021 2014   BASOSABS 0.0 01/13/2021 2014   COAG:  Lab Results  Component Value Date   INR 1.0 01/14/2021    BMP:  BMP Latest Ref Rng & Units 01/17/2021 01/16/2021 01/15/2021  Glucose 70 - 99 mg/dL 91 125(H) 97  BUN 8 - 23 mg/dL 10 14 21   Creatinine 0.44 - 1.00 mg/dL 0.46 0.43(L) 0.56  Sodium 135 - 145 mmol/L 138 143 146(H)  Potassium 3.5 - 5.1 mmol/L 3.3(L) 3.4(L) 3.0(L)  Chloride 98 - 111 mmol/L 104 110 109  CO2 22 - 32 mmol/L 29 28 26   Calcium 8.9 - 10.3 mg/dL 8.6(L) 9.0 9.0    HEPATIC:  Hepatic Function Latest Ref Rng & Units 01/14/2021 01/13/2021 12/28/2020  Total Protein 6.5 - 8.1 g/dL 7.7 8.0 6.7  Albumin 3.5 - 5.0 g/dL 3.1(L) 3.2(L) 2.7(L)  AST 15 - 41 U/L  23 27 33  ALT 0 - 44 U/L 14 15 14   Alk Phosphatase 38 - 126 U/L 55 54 49  Total Bilirubin 0.3 - 1.2 mg/dL 0.7 1.0 0.6    CARDIAC:  Lab Results  Component Value Date   CKTOTAL 1,042 (H) 10/24/2020      Imaging: I personally reviewed and interpreted the available labs, imaging and endoscopic files.   Assessment/Plan: 80 year old female with past medical history of achalasia (unknown type), GERD, positive ANA and SSA, diabetes, hypertension, who came to the hospital after presenting worsening regurgitation, dysphagia and poor oral intake with vomiting episodes.  The patient had a previous diagnosis of achalasia in 2014 that was managed conservatively,  however her symptoms worsened since early 2022.  Underwent a previous EGD with empiric dilation after being found to have a dilated esophagus with hypertonic LES but she did not have any improvement of the symptoms.  During this hospitalization the patient underwent EGD with Botox injection on 01/15/2021.  Notably, she was found to have large amount of fluid in her stomach that was suctioned.  No other alterations were found besides of the presence of hypertonic LES. The patient has clinically slowly improved as she reports that she is able to tolerate some liquids at the moment with less episodes of regurgitation and needing to suction her mouth less often.  We will assess her clinical improvement in the following days before considering placement of gastrostomy tube.  It would be important to obtain the manometry records to determine any type of achalasia she has.  -Continue liquid diet for now -We will consider possibly a gastrostomy tube if no improvement in her swallowing -Obtain records of her manometry report.  Maylon Peppers, MD Gastroenterology and Hepatology [facility]

## 2021-01-18 DIAGNOSIS — K220 Achalasia of cardia: Secondary | ICD-10-CM | POA: Diagnosis not present

## 2021-01-18 DIAGNOSIS — E860 Dehydration: Secondary | ICD-10-CM | POA: Diagnosis not present

## 2021-01-18 DIAGNOSIS — R1310 Dysphagia, unspecified: Secondary | ICD-10-CM | POA: Diagnosis not present

## 2021-01-18 DIAGNOSIS — R1010 Upper abdominal pain, unspecified: Secondary | ICD-10-CM | POA: Diagnosis not present

## 2021-01-18 LAB — BASIC METABOLIC PANEL
Anion gap: 6 (ref 5–15)
BUN: 10 mg/dL (ref 8–23)
CO2: 26 mmol/L (ref 22–32)
Calcium: 8.7 mg/dL — ABNORMAL LOW (ref 8.9–10.3)
Chloride: 105 mmol/L (ref 98–111)
Creatinine, Ser: 0.42 mg/dL — ABNORMAL LOW (ref 0.44–1.00)
GFR, Estimated: >60 mL/min (ref >60)
Glucose, Bld: 72 mg/dL (ref 70–99)
Potassium: 3.8 mmol/L (ref 3.5–5.1)
Sodium: 137 mmol/L (ref 135–145)

## 2021-01-18 LAB — PHOSPHORUS: Phosphorus: 2.5 mg/dL (ref 2.5–4.6)

## 2021-01-18 LAB — CBC
HCT: 32.2 % — ABNORMAL LOW (ref 36.0–46.0)
Hemoglobin: 10.5 g/dL — ABNORMAL LOW (ref 12.0–15.0)
MCH: 32.2 pg (ref 26.0–34.0)
MCHC: 32.6 g/dL (ref 30.0–36.0)
MCV: 98.8 fL (ref 80.0–100.0)
Platelets: 147 10*3/uL — ABNORMAL LOW (ref 150–400)
RBC: 3.26 MIL/uL — ABNORMAL LOW (ref 3.87–5.11)
RDW: 14.6 % (ref 11.5–15.5)
WBC: 9.2 10*3/uL (ref 4.0–10.5)
nRBC: 0 % (ref 0.0–0.2)

## 2021-01-18 LAB — MAGNESIUM: Magnesium: 1.7 mg/dL (ref 1.7–2.4)

## 2021-01-18 MED ORDER — METOPROLOL TARTRATE 5 MG/5ML IV SOLN
2.5000 mg | Freq: Four times a day (QID) | INTRAVENOUS | Status: DC
  Administered 2021-01-18 – 2021-02-04 (×65): 2.5 mg via INTRAVENOUS
  Filled 2021-01-18 (×61): qty 5

## 2021-01-18 NOTE — Progress Notes (Signed)
Kathryn [name], M.D. Gastroenterology & Hepatology   Interval History:  No acute events overnight. Patient states that she has been able to tolerate more of her liquid diet.  Denies having any nausea, vomiting, fever, chills.  States that she still has a drain some secretions from her mouth but less often than before.  No other complaint  Inpatient Medications:  Current Facility-Administered Medications:    metoprolol tartrate (LOPRESSOR) tablet 25 mg, 25 mg, Oral, BID, Adefeso, Oladapo, DO, 25 mg at 01/18/21 1029   morphine 2 MG/ML injection 0.5 mg, 0.5 mg, Intravenous, Q4H PRN, Wynetta Emery, Clanford L, MD, 0.5 mg at 01/17/21 0301   pantoprazole (PROTONIX) EC tablet 40 mg, 40 mg, Oral, BID, Johnson, Clanford L, MD, 40 mg at 01/18/21 1029   prochlorperazine (COMPAZINE) injection 10 mg, 10 mg, Intravenous, Q6H PRN, Adefeso, Oladapo, DO   scopolamine (TRANSDERM-SCOP) 1 MG/3DAYS 1.5 mg, 1 patch, Transdermal, Q72H, Zierle-Ghosh, Asia B, DO, 1.5 mg at 01/17/21 2121   I/O    Intake/Output Summary (Last 24 hours) at 01/18/2021 1119 Last data filed at 01/18/2021 0900 Gross per 24 hour  Intake 605.74 ml  Output --  Net 605.74 ml     Physical Exam: Temp:  [97.5 F (36.4 C)-99 F (37.2 C)] 97.5 F (36.4 C) (07/24 1020) Pulse Rate:  [100-108] 106 (07/24 1020) Resp:  [16-19] 19 (07/24 0339) BP: (119-155)/(77-99) 119/77 (07/24 1020) SpO2:  [98 %-100 %] 100 % (07/24 1020)  Temp (24hrs), Avg:98.2 F (36.8 C), Min:97.5 F (36.4 C), Max:99 F (37.2 C) GENERAL: The patient is AO x3, in no acute distress. Elder, frail. HEENT: Head is normocephalic and atraumatic. EOMI are intact. Mouth is well hydrated and without lesions. NECK: Supple. No masses LUNGS: Clear to auscultation. No presence of rhonchi/wheezing/rales. Adequate chest expansion HEART: RRR, normal s1 and s2. ABDOMEN: Soft, nontender, no guarding, no peritoneal signs, and nondistended. BS +. No masses. EXTREMITIES: Without any  cyanosis, clubbing, rash, lesions or edema. NEUROLOGIC: AOx3, no focal motor deficit. SKIN: no jaundice, no rashes  Laboratory Data: CBC:     Component Value Date/Time   WBC 9.2 01/18/2021 0554   RBC 3.26 (L) 01/18/2021 0554   HGB 10.5 (L) 01/18/2021 0554   HCT 32.2 (L) 01/18/2021 0554   PLT 147 (L) 01/18/2021 0554   MCV 98.8 01/18/2021 0554   MCH 32.2 01/18/2021 0554   MCHC 32.6 01/18/2021 0554   RDW 14.6 01/18/2021 0554   LYMPHSABS 0.5 (L) 01/13/2021 2014   MONOABS 0.7 01/13/2021 2014   EOSABS 0.0 01/13/2021 2014   BASOSABS 0.0 01/13/2021 2014   COAG:  Lab Results  Component Value Date   INR 1.0 01/14/2021    BMP:  BMP Latest Ref Rng & Units 01/18/2021 01/17/2021 01/16/2021  Glucose 70 - 99 mg/dL 72 91 125(H)  BUN 8 - 23 mg/dL _0 Creatinine 0.44 - 1.00 mg/dL 0.42(L) 0.46 0.43(L)  Sodium 135 - 145 mmol/L 137 138 143  Potassium 3.5 - 5.1 mmol/L 3.8 3.3(L) 3.4(L)  Chloride 98 - 111 mmol/L 105 104 110  CO2 22 - 32 mmol/L _1 Calcium 8.9 - 10.3 mg/dL 8.7(L) 8.6(L) 9.0    HEPATIC:  Hepatic Function Latest Ref Rng & Units 01/14/2021 01/13/2021 12/28/2020  Total Protein 6.5 - 8.1 g/dL 7.7 8.0 6.7  Albumin 3.5 - 5.0 g/dL 3.1(L) 3.2(L) 2.7(L)  AST 15 - 41 U/L 23 27 33  ALT 0 - 44 U/L _2 Alk Phosphatase 38 -  126 U/L 55 54 49  Total Bilirubin 0.3 - 1.2 mg/dL 0.7 1.0 0.6    CARDIAC:  Lab Results  Component Value Date   CKTOTAL 1,042 (H) 10/24/2020      Imaging: I personally reviewed and interpreted the available labs, imaging and endoscopic files.   Assessment/Plan: 80 year old female with past medical history of achalasia (unknown type), positive ANA and SSA, diabetes, hypertension, who came to the hospital after presenting worsening regurgitation, dysphagia and poor oral intake with vomiting episodes.  The patient had a previous diagnosis of achalasia in 2014 that was managed conservatively, however her symptoms worsened since early 2022.  Underwent a  previous EGD with empiric dilation after being found to have a dilated esophagus with hypertonic LES but she did not have any improvement of the symptoms.  During this hospitalization the patient underwent EGD with Botox injection on 01/15/2021.  Notably, she was found to have large amount of fluid in her stomach that was suctioned.  No other alterations were found besides of the presence of hypertonic LES.  The patient has clinically slowly improved as she reports that she is able to tolerate more  liquids at the moment with less episodes of regurgitation and needing to suction her mouth less often.  We will assess her clinical improvement in the following days before considering placement of gastrostomy tube.  It would be important to obtain the manometry records to determine any type of achalasia she has, as she may need more permanent treatment in the future such as Heller myotomy or POEM if eligible.  I also explained to the patient and the family member that her episodes of heartburn/regurgitation were likely related to fluid stasis.   -Continue full liquid diet for now - Protein supplements twice a day -We will consider possibly a gastrostomy tube if no improvement in her swallowing -Obtain records of her manometry report.   Kathryn Peppers, MD Gastroenterology and Hepatology [facility]

## 2021-01-18 NOTE — Progress Notes (Signed)
PROGRESS NOTE    Kathryn [name]  WFU:[id number] DOB: 1941-03-31 DOA: 01/13/2021 PCP: Suzan Slick, MD  Outpatient Specialists: Rheumatology (first appointment 01/26/21)  Brief Narrative:   Kathryn [name] is a 80 y.o. female with medical history significant for  hypertension, type 2 diabetes mellitus, GERD, positive ANA and SSA autoantibodies, and chronic achalasia (2014 manometry showed abnormal motility)  who presents to the emergency department due to more than 2 months history of decreased oral intake, generalized weakness and about 3 to 4-week onset of abdominal pain associated with difficulty in being able to swallow (solid and liquid).  She believes that she must have lost up to 30 pounds since onset of symptoms.  Patient endorsed that she usually regurgitates any food/liquid ingested within 15 to 20 minutes of ingestion.  Abdominal pain was in the epigastric area and was sharp in nature, this worsens with food.  She denies fever, chills, chest pain, shortness of breath.   She was admitted to the hospital from 12/25/2020-12/28/2020, at that time barium p.o. esophagram done urgently as an outpatient was indicated.  She was also admitted from 10/23/2020 to 10/25/2020 during which patient presented with proximal muscle weakness with an elevated CPK of 1206 that was concerning for polymyalgia rheumatica. This was treated with prednisone with improvement.  EGD done on 10/04/2020 showed normal dilated esophagus and small hiatal hernia.   In the emergency department, she was intermittently tachypneic and tachycardic.  Other vital signs were within normal range.  Work-up in the ED showed normocytic anemia and leukocytosis, BMP was normal except for elevated BUN at and hyperglycemia with CBG of 135.  Albumin 3.2. CT of head without contrast showed no acute intracranial abnormality. IV hydration was provided, hospitalist was asked to admit patient for further evaluation and management.   7/20 - Patient  still experiences significant epigastric pain. Using suction for oral secretion. No other acute complaints. GI (Dr. Karilyn Cota) was consulted and will provide further recommendations. Of note, patient has not been able to follow up with outpatient rheumatology and has not been receiving treatments    7/21 - Patient is clinically stable. EGD with botox treatment was successfully completed.   7/22 - Patient continues to have difficulty swallowing.  Began discussion with patient regarding possible need for temporary PEG placement if needed.    Assessment & [name]:   Principal Problem:   Abdominal pain Active Problems:   HTN (hypertension)   Dysphagia   Nausea and vomiting   Generalized weakness   Dehydration   Normocytic anemia   Leukocytosis   Hyperglycemia   Hypoalbuminemia due to protein-calorie malnutrition (HCC)   Prolonged QT interval   Achalasia   Malnutrition of moderate degree  Epigastric/Abdominal pain, nausea and vomiting in the setting of esophageal dysphagia Patient is ANA/SSA antibody positive. Sjogren's syndrome and SLE are on the differential. Patient also has had increased oral secretion, suggestive of sicca-related symptoms. Gastrointestinal involvement of SLE can manifest as esophageal motility disorder, such as achalasia, which has been suggesting in this patient. Other GI manifestations of SLE include acute pancreatitis, for which  patient signs and symptoms are concerning, but CT 6/30 has shown no acute pancreatic findings. EGD in April 2022 showed unremarkable esophagus, and patient underwent dialtion given history of dysphagia, but patient states that did not help with symptoms.   -Continue IV morphine 0.5 mg q.4h p.r.n. for severe pain -Continue Protonix 40 mg po BID -Continue clear liquid diet with [name] to advanced diet as tolerated -Gastroenterology  following for further recommendations -ongoing goals of care discussions with palliative team, patient and daughter  regarding tube feeding -Pt seems to be amenable to PEG placement if absolutely necessary   Generalized weakness and dehydration in the setting of above -Hypoalbuminemia secondary to mild protein calorie malnutrition -Continue IV hydration as described above -Continue protein supplement   Urinary tract infection - UA in the ED showed many bacteria, moderate ketones, nitrites -Patient is on ceftriaxone 1g for coverage (completed on 7/23)  Normocytic anemia -Chronic and Stable  Leukocytosis/hyperglycemia possibly reactive - WBC normalized to 10 this AM - Continue to monitor WBC and blood glucose with morning labs   Prolonged QTc (RESOLVED) - Avoid QT prolonging drugs - Magnesium 1.9 - Repeated EKG with resolution of prolonged QTc    COVID-19 infection (incidental finding) -Patient is asymptomatic and stable on room air -This was already diagnosed during last admission -Continue airborne and contact precautions   Essential hypertension -Continue Lopressor but since can't take tabs now, we changed it to IV lopressor 2.5 mg Q6h    Hyperglycemia secondary to T2DM -No medication noted on med rec -Hemoglobin A1c on 6/30-5.8   GERD -Continue Protonix    DVT prophylaxis: SCD Code Status: Full Family Communication: Phone contact to daughter Kathryn [name]: SNF or family member's home   Consultants:  Gastroenterology (Dr. Jena Gauss) Palliative care   Procedures:  Barium swallow 7/20 EGD with botox injection 7/21  Consultants:  GI  Subjective: Pt tolerating a little bit more of liquids today.   Objective: Vitals:   01/17/21 1945 01/17/21 2022 01/18/21 0339 01/18/21 1020  BP:  (!) 155/99 (!) 149/83 119/77  Pulse: (!) 103 (!) 108 100 (!) 106  Resp: 16 18 19    Temp:  98.2 F (36.8 C) 98 F (36.7 C) (!) 97.5 F (36.4 C)  TempSrc:    Axillary  SpO2: 98% 100% 100% 100%  Weight:      Height:        Intake/Output Summary (Last 24 hours) at 01/18/2021  1153 Last data filed at 01/18/2021 0900 Gross per 24 hour  Intake 605.74 ml  Output --  Net 605.74 ml   Filed Weights   01/13/21 1909  Weight: 54.4 kg   Examination:  General exam: frail, emaciated appearing female, Appears calm and comfortable  Respiratory system: Clear to auscultation. Respiratory effort normal. Cardiovascular system: S1 & S2 heard.  No JVD, murmurs, rubs, gallops or clicks. No pedal edema. Gastrointestinal system: Abdomen is nondistended, soft and nontender. No organomegaly or masses felt. Normal bowel sounds heard. Central nervous system: Alert and oriented. No focal neurological deficits. Extremities: Symmetric 5 x 5 power. Skin: No rashes, lesions or ulcers Psychiatry: Judgement and insight appear diminished. Mood & affect flat.   Data Reviewed: I have personally reviewed following labs and imaging studies  CBC: Recent Labs  Lab 01/13/21 2014 01/14/21 0409 01/15/21 0350 01/16/21 0445 01/17/21 0548 01/18/21 0554  WBC 11.5* 10.2 10.2 9.2 8.5 9.2  NEUTROABS 10.3*  --   --   --   --   --   HGB 11.2* 11.1* 10.6* 9.9* 10.7* 10.5*  HCT 34.7* 35.1* 33.8* 31.3* 32.2* 32.2*  MCV 98.6 99.7 100.6* 100.3* 99.4 98.8  PLT 171 166 165 148* 143* 147*   Basic Metabolic Panel: Recent Labs  Lab 01/14/21 0409 01/15/21 0350 01/15/21 0712 01/16/21 0445 01/17/21 0548 01/18/21 0554  NA 143 146*  --  143 138 137  K 3.5 3.0*  --  3.4*  3.3* 3.8  CL 105 109  --  110 104 105  CO2 28 26  --  28 29 26   GLUCOSE 105* 97  --  125* 91 72  BUN 28* 21  --  14 10 10   CREATININE 0.64 0.56  --  0.43* 0.46 0.42*  CALCIUM 9.3 9.0  --  9.0 8.6* 8.7*  MG  --   --  2.0 1.9  --  1.7  PHOS  --   --   --  1.6*  --  2.5   GFR: Estimated Creatinine Clearance: 45.6 mL/min (A) (by C-G formula based on SCr of 0.42 mg/dL (L)). Liver Function Tests: Recent Labs  Lab 01/13/21 2014 01/14/21 0409  AST 27 23  ALT 15 14  ALKPHOS 54 55  BILITOT 1.0 0.7  PROT 8.0 7.7  ALBUMIN 3.2* 3.1*    Recent Labs  Lab 01/13/21 2014  LIPASE 37   No results for input(s): AMMONIA in the last 168 hours. Coagulation Profile: Recent Labs  Lab 01/14/21 0409  INR 1.0   Cardiac Enzymes: No results for input(s): CKTOTAL, CKMB, CKMBINDEX, TROPONINI in the last 168 hours. BNP (last 3 results) No results for input(s): PROBNP in the last 8760 hours. HbA1C: No results for input(s): HGBA1C in the last 72 hours. CBG: Recent Labs  Lab 01/15/21 1334 01/15/21 1507  GLUCAP 112* 95   Lipid Profile: No results for input(s): CHOL, HDL, LDLCALC, TRIG, CHOLHDL, LDLDIRECT in the last 72 hours. Thyroid Function Tests: No results for input(s): TSH, T4TOTAL, FREET4, T3FREE, THYROIDAB in the last 72 hours. Anemia Panel: No results for input(s): VITAMINB12, FOLATE, FERRITIN, TIBC, IRON, RETICCTPCT in the last 72 hours. Urine analysis:    Component Value Date/Time   COLORURINE AMBER (A) 01/13/2021 2309   APPEARANCEUR CLOUDY (A) 01/13/2021 2309   LABSPEC 1.025 01/13/2021 2309   PHURINE 5.0 01/13/2021 2309   GLUCOSEU NEGATIVE 01/13/2021 2309   HGBUR SMALL (A) 01/13/2021 2309   BILIRUBINUR NEGATIVE 01/13/2021 2309   KETONESUR 20 (A) 01/13/2021 2309   PROTEINUR 100 (A) 01/13/2021 2309   NITRITE POSITIVE (A) 01/13/2021 2309   LEUKOCYTESUR MODERATE (A) 01/13/2021 2309   )No results found for this or any previous visit (from the past 240 hour(s)).  Radiology Studies: No results found.  Scheduled Meds:  metoprolol tartrate  25 mg Oral BID   pantoprazole  40 mg Oral BID   scopolamine  1 patch Transdermal Q72H   Continuous Infusions:   LOS: 5 days   Time spent: 35 minutes  Keisa Blow, MD [facility] If 7PM-7AM, please contact night-coverage [email or web address] Password [facility] 01/18/2021, 11:53 AM

## 2021-01-19 DIAGNOSIS — R1010 Upper abdominal pain, unspecified: Secondary | ICD-10-CM | POA: Diagnosis not present

## 2021-01-19 DIAGNOSIS — E860 Dehydration: Secondary | ICD-10-CM | POA: Diagnosis not present

## 2021-01-19 DIAGNOSIS — R1310 Dysphagia, unspecified: Secondary | ICD-10-CM | POA: Diagnosis not present

## 2021-01-19 DIAGNOSIS — K220 Achalasia of cardia: Secondary | ICD-10-CM | POA: Diagnosis not present

## 2021-01-19 NOTE — Progress Notes (Signed)
Subjective: Patient doing okay this morning, continues to need to suction secretions from mouth every few minutes. States that she is continuing to have regurgitation every time she drinks any liquids 10-15 minutes after intake, about the same as yesterday, maybe slightly improved since Botox injection on 7/21, however, no significant improvement in symptoms thus far. She denies pain, nausea or vomiting.  Objective: Vital signs in last 24 hours: Temp:  [97.5 F (36.4 C)-98.1 F (36.7 C)] 97.5 F (36.4 C) (07/25 0443) Pulse Rate:  [90-106] 96 (07/25 0443) Resp:  [16-19] 16 (07/25 0443) BP: (119-140)/(76-81) 140/81 (07/25 0443) SpO2:  [100 %] 100 % (07/25 0443) Last BM Date: 01/11/21 General:   Alert and oriented, pleasant Mouth:  Without lesions, mucosa pink and moist.  Heart:  S1, S2 present, no murmurs noted.  Lungs: Clear to auscultation bilaterally, without wheezing, rales, or rhonchi.  Abdomen:  Bowel sounds present, soft, non-tender, non-distended. No HSM or hernias noted. No rebound or guarding. No masses appreciated  Msk:  Symmetrical without gross deformities. Normal posture. Extremities:  Without clubbing or edema. Neurologic:  Alert and  oriented x4;  grossly normal neurologically. Skin:  Warm and dry, intact without significant lesions.  Psych:  Alert and cooperative. Normal mood and affect.  Intake/Output from previous day: 07/24 0701 - 07/25 0700 In: 1400 [P.O.:1400] Out: 500 [Urine:500] Intake/Output this shift: No intake/output data recorded.  Lab Results: Recent Labs    01/17/21 0548 01/18/21 0554  WBC 8.5 9.2  HGB 10.7* 10.5*  HCT 32.2* 32.2*  PLT 143* 147*   BMET Recent Labs    01/17/21 0548 01/18/21 0554  NA 138 137  K 3.3* 3.8  CL 104 105  CO2 29 26  GLUCOSE 91 72  BUN 10 10  CREATININE 0.46 0.42*  CALCIUM 8.6* 8.7*    Assessment: Kathryn [name] is an 80 y.o. female with past medical history of achalasia (type II ?), positive ANA, SSA,  diabetes, and HTN who presented to the hospital after worsening regurgitation, increase in oral secretions, dysphagia and poor oral intake with episodes of vomiting over the past two months. She endorsed approx 30 lbs weight loss during that time.  The patient has a previous diagnosis of achalasia in 2014 (possibly type II per notes) that was managed conservatively, however, symptoms began to worsen earlier this year. She underwent a previous EGD  10/24/20, with empiric dilation after dilated esophagus and mid esophageal wall thickening were found on CT, however, no improvement in symptoms after that. During current admission, she underwent EGD with botox injection on 01/15/21 by Dr. Karilyn Cota. Hypertonic LES and large amount of fluid in stomach (suctioned) were found during procedure.   Patient reports some mild improvement since Botox injection during EGD on 7/21, however, she continues to need to suction oral secretions every few minutes and complains of continued regurgitation of liquids 10-15 minutes after drinking anything. She denies nausea or vomiting. Will continue to monitor for improvement of symptoms, however, lack of improvement will warrant further discussion of gastrostomy tube placement.  We are awaiting previous manometry records to definitively determine type of achalasia she was diagnosed with, as this could impact further treatment. Heller myotomy or POEM may eventually be required to treat this.  Previously elevated ANA and SSA, in conjunction with increased oral secretions and GI involvement questionably related to possible Sjogren's, scleroderma or SLE, however, no confirmatory diagnosis has been made to date.  Question whether there is some underlying aspect of gastroparesis contributing  to symptoms, however, it's unlikely that patient would be able to tolerate gastric emptying study, given continued regurgitation and ample oral secretions that are requiring  suctioning.  Plan: Continue clear liquid diet as tolerated Continue protein supplements BID Consider possible gastrostomy tube if symptoms do not improve Will evaluate manometry records to definitively determine type of achalasia previously diagnosed   LOS: 6 days    01/19/2021, 9:02 AM  [name] L. Jeanmarie Hubert, MSN, APRN, AGNP-C Adult-Gerontology Nurse Practitioner [facility]

## 2021-01-19 NOTE — Progress Notes (Signed)
Physical Therapy Treatment Patient Details Name: Kathryn [name] MRN: [id number] DOB: Sep 19, 1940 Today's Date: 01/19/2021    History of Present Illness Kathryn [name] is a 80 y.o. female with medical history significant for  hypertension, type 2 diabetes mellitus, GERD, positive ANA and SSA autoantibodies who presents to the emergency department due to more than 2 months history of decreased oral intake, generalized weakness and about 3 to 4-week onset of abdominal pain associated with difficulty in being able to swallow (solid and liquid).  She believes that she must have lost up to 30 pounds since onset of symptoms.  Patient endorsed that she usually regurgitates any food/liquid ingested within 15 to 20 minutes of ingestion.  Abdominal pain was in the epigastric area and was sharp in nature, this worsens with food.  She denies fever, chills, chest pain, shortness of breath.  She was admitted to the hospital from 12/25/2020-12/28/2020, at that time barium p.o. esophagram done urgently as an outpatient was indicated.  She was also admitted from 10/23/2020 to 10/25/2020 during which patient presented with proximal muscle weakness with an elevated CPK of 1206, this was treated with prednisone with improvement.  EGD done on 10/04/2020 showed normal dilated esophagus and small hiatal hernia.    PT Comments    Patient demonstrates slow labored movement for sitting up at bedside requiring use of bed rail and Min/mod assist, required active assistance to move for completing LLE hip raises due to weakness, unsteady on feet with short step/stride length and near loss of balance when making turns due to scissoring of legs.  Patient tolerated sitting up in chair after therapy - RN notified.  Patient will benefit from continued physical therapy in hospital and recommended venue below to increase strength, balance, endurance for safe ADLs and gait.     Follow Up Recommendations  SNF;Supervision for  mobility/OOB;Supervision - Intermittent     Equipment Recommendations  None recommended by PT    Recommendations for Other Services       Precautions / Restrictions Precautions Precautions: Fall Restrictions Weight Bearing Restrictions: No    Mobility  Bed Mobility Overal bed mobility: Needs Assistance Bed Mobility: Rolling Rolling: Min guard Sidelying to sit: Min assist;Mod assist       General bed mobility comments: increased time, labored movement    Transfers Overall transfer level: Needs assistance Equipment used: Rolling walker (2 wheeled) Transfers: Sit to/from [employer] Sit to Stand: Min guard;Min assist Stand pivot transfers: Min guard;Min assist       General transfer comment: slow labored movement  Ambulation/Gait Ambulation/Gait assistance: Min assist;Mod assist Gait Distance (Feet): 25 Feet Assistive device: Rolling walker (2 wheeled) Gait Pattern/deviations: Decreased step length - right;Decreased step length - left;Decreased stride length;Scissoring Gait velocity: decreased   General Gait Details: slow labored cadence with short step/stride length, has most difficulty making turns due to scissoring of legs requiring tactile cueing to avoid loss of balance   Stairs             Wheelchair Mobility    Modified Rankin (Stroke Patients Only)       Balance Overall balance assessment: Needs assistance Sitting-balance support: Feet supported;No upper extremity supported Sitting balance-Leahy Scale: Fair Sitting balance - Comments: fair/good seated at EOB   Standing balance support: During functional activity;Bilateral upper extremity supported Standing balance-Leahy Scale: Fair Standing balance comment: using RW  Cognition Arousal/Alertness: Awake/alert Behavior During Therapy: WFL for tasks assessed/performed Overall Cognitive Status: Within Functional Limits for tasks  assessed                                        Exercises General Exercises - Lower Extremity Fehrenbach Arc Quad: Seated;AROM;Strengthening;Both;10 reps Hip Flexion/Marching: Seated;AROM;Strengthening;Both;10 reps Toe Raises: Seated;AROM;Strengthening;Both;15 reps Heel Raises: Seated;AROM;Strengthening;Both;15 reps    General Comments        Pertinent Vitals/Pain Pain Assessment: No/denies pain    Home Living                      Prior Function            PT Goals (current goals can now be found in the care plan section) Acute Rehab PT Goals Patient Stated Goal: return home with family to assist PT Goal Formulation: With patient/family Time For Goal Achievement: 01/29/21 Potential to Achieve Goals: Good Progress towards PT goals: Progressing toward goals    Frequency    Min 3X/week      PT Plan Current plan remains appropriate    Co-evaluation              AM-PAC PT "6 Clicks" Mobility   Outcome Measure  Help needed turning from your back to your side while in a flat bed without using bedrails?: A Little Help needed moving from lying on your back to sitting on the side of a flat bed without using bedrails?: A Little Help needed moving to and from a bed to a chair (including a wheelchair)?: A Little Help needed standing up from a chair using your arms (e.g., wheelchair or bedside chair)?: A Little Help needed to walk in hospital room?: A Lot Help needed climbing 3-5 steps with a railing? : A Lot 6 Click Score: 16    End of Session   Activity Tolerance: Patient tolerated treatment well;Patient limited by fatigue Patient left: in chair;with call bell/phone within reach Nurse Communication: Mobility status PT Visit Diagnosis: Unsteadiness on feet (R26.81);Other abnormalities of gait and mobility (R26.89);Muscle weakness (generalized) (M62.81)     Time: 9622-2979 PT Time Calculation (min) (ACUTE ONLY): 24 min  Charges:  $Gait  Training: 8-22 mins $Therapeutic Exercise: 8-22 mins                     2:00 PM, 01/19/21 Ocie Bob, MPT Physical Therapist with [facility] [phone] office [phone] mobile phone

## 2021-01-19 NOTE — Progress Notes (Signed)
PROGRESS NOTE    [facility] [facility]  WUJ:[id number] DOB: 1940-07-12 DOA: 01/13/2021 PCP: Suzan Slick, MD  Outpatient Specialists: Rheumatology (first appointment 01/26/21)  Brief Narrative:   Kathryn [name] is a 80 y.o. female with medical history significant for  hypertension, type 2 diabetes mellitus, GERD, positive ANA and SSA autoantibodies, and chronic achalasia (2014 manometry showed abnormal motility)  who presents to the emergency department due to more than 2 months history of decreased oral intake, generalized weakness and about 3 to 4-week onset of abdominal pain associated with difficulty in being able to swallow (solid and liquid).  She believes that she must have lost up to 30 pounds since onset of symptoms.  Patient endorsed that she usually regurgitates any food/liquid ingested within 15 to 20 minutes of ingestion.  Abdominal pain was in the epigastric area and was sharp in nature, this worsens with food.  She denies fever, chills, chest pain, shortness of breath.   She was admitted to the hospital from 12/25/2020-12/28/2020, at that time barium p.o. esophagram done urgently as an outpatient was indicated.  She was also admitted from 10/23/2020 to 10/25/2020 during which patient presented with proximal muscle weakness with an elevated CPK of 1206 that was concerning for polymyalgia rheumatica. This was treated with prednisone with improvement.  EGD done on 10/04/2020 showed normal dilated esophagus and small hiatal hernia.   In the emergency department, she was intermittently tachypneic and tachycardic.  Other vital signs were within normal range.  Work-up in the ED showed normocytic anemia and leukocytosis, BMP was normal except for elevated BUN at and hyperglycemia with CBG of 135.  Albumin 3.2. CT of head without contrast showed no acute intracranial abnormality. IV hydration was provided, hospitalist was asked to admit patient for further evaluation and management.   7/20 - Patient  still experiences significant epigastric pain. Using suction for oral secretion. No other acute complaints. GI (Dr. Karilyn Cota) was consulted and will provide further recommendations. Of note, patient has not been able to follow up with outpatient rheumatology and has not been receiving treatments    7/21 - Patient is clinically stable. EGD with botox treatment was successfully completed.   7/22 - Patient continues to have difficulty swallowing.  Began discussion with patient regarding possible need for temporary PEG placement if needed.   7/24-7/25 - pt making trials of liquids but only tolerating small amounts of liquids.    Assessment & Plan:   Principal Problem:   Abdominal pain Active Problems:   HTN (hypertension)   Dysphagia   Nausea and vomiting   Generalized weakness   Dehydration   Normocytic anemia   Leukocytosis   Hyperglycemia   Hypoalbuminemia due to protein-calorie malnutrition (HCC)   Prolonged QT interval   Achalasia   Malnutrition of moderate degree  Epigastric/Abdominal pain, nausea and vomiting in the setting of esophageal dysphagia Patient is ANA/SSA antibody positive. Sjogren's syndrome and SLE are on the differential. Patient also has had increased oral secretion, suggestive of sicca-related symptoms. Gastrointestinal involvement of SLE can manifest as esophageal motility disorder, such as achalasia, which has been suggesting in this patient. Other GI manifestations of SLE include acute pancreatitis, for which  patient signs and symptoms are concerning, but CT 6/30 has shown no acute pancreatic findings. EGD in April 2022 showed unremarkable esophagus, and patient underwent dialtion given history of dysphagia, but patient states that did not help with symptoms.   -Continue IV morphine 0.5 mg q.4h p.r.n. for severe pain -Continue Protonix  40 mg po BID -Continue clear liquid diet with plan to advanced diet as tolerated -Gastroenterology following for further  recommendations -ongoing goals of care discussions with palliative team, patient and daughter regarding tube feeding -Pt seems to be amenable to PEG placement if absolutely necessary   Generalized weakness and dehydration in the setting of above -Hypoalbuminemia secondary to mild protein calorie malnutrition -Continue IV hydration as described above -Continue protein supplement   Urinary tract infection - UA in the ED showed many bacteria, moderate ketones, nitrites -Patient is on ceftriaxone 1g for coverage (completed on 7/23)  Normocytic anemia -Chronic and Stable  Leukocytosis/hyperglycemia possibly reactive - WBC normalized to 10 this AM - Continue to monitor WBC and blood glucose with morning labs   Prolonged QTc (RESOLVED) - Avoid QT prolonging drugs - Magnesium 1.9 - Repeated EKG with resolution of prolonged QTc    COVID-19 infection (incidental finding) -Patient is asymptomatic and stable on room air -This was already diagnosed during last admission -Continue airborne and contact precautions   Essential hypertension -Continue Lopressor but since can't take tabs now, we changed it to IV lopressor 2.5 mg Q6h    Hyperglycemia secondary to T2DM -No medication noted on med rec -Hemoglobin A1c on 6/30-5.8   GERD -Continue Protonix    DVT prophylaxis: SCD Code Status: Full Family Communication: Phone update to daughter Kathryn [name] 7/25 Disposition Plan: SNF or family member's home   Consultants:  Gastroenterology (Dr. Jena Gauss) Palliative care   Procedures:  Barium swallow 7/20 EGD with botox injection 7/21  Consultants:  GI  Subjective: Pt tolerating small amounts of liquids only.   Objective: Vitals:   01/18/21 1020 01/18/21 1415 01/18/21 2036 01/19/21 0443  BP: 119/77 125/76 130/78 140/81  Pulse: (!) 106 100 90 96  Resp:  19 16 16   Temp: (!) 97.5 F (36.4 C) 97.8 F (36.6 C) 98.1 F (36.7 C) (!) 97.5 F (36.4 C)  TempSrc: Axillary Oral Oral Oral   SpO2: 100% 100% 100% 100%  Weight:      Height:        Intake/Output Summary (Last 24 hours) at 01/19/2021 1204 Last data filed at 01/19/2021 0700 Gross per 24 hour  Intake 1160 ml  Output 500 ml  Net 660 ml   Filed Weights   01/13/21 1909  Weight: 54.4 kg   Examination:  General exam: frail, emaciated appearing female, Appears calm and comfortable  Respiratory system: Clear to auscultation. Respiratory effort normal. Cardiovascular system: S1 & S2 heard.  No JVD, murmurs, rubs, gallops or clicks. No pedal edema. Gastrointestinal system: Abdomen is nondistended, soft and nontender. No organomegaly or masses felt. Normal bowel sounds heard. Central nervous system: Alert and oriented. No focal neurological deficits. Extremities: Symmetric 5 x 5 power. Skin: No rashes, lesions or ulcers Psychiatry: Judgement and insight appear diminished. Mood & affect flat.   Data Reviewed: I have personally reviewed following labs and imaging studies  CBC: Recent Labs  Lab 01/13/21 2014 01/14/21 0409 01/15/21 0350 01/16/21 0445 01/17/21 0548 01/18/21 0554  WBC 11.5* 10.2 10.2 9.2 8.5 9.2  NEUTROABS 10.3*  --   --   --   --   --   HGB 11.2* 11.1* 10.6* 9.9* 10.7* 10.5*  HCT 34.7* 35.1* 33.8* 31.3* 32.2* 32.2*  MCV 98.6 99.7 100.6* 100.3* 99.4 98.8  PLT 171 166 165 148* 143* 147*   Basic Metabolic Panel: Recent Labs  Lab 01/14/21 0409 01/15/21 0350 01/15/21 01/17/21 01/16/21 0445 01/17/21 0548 01/18/21 0554  NA  143 146*  --  143 138 137  K 3.5 3.0*  --  3.4* 3.3* 3.8  CL 105 109  --  110 104 105  CO2 28 26  --  28 29 26   GLUCOSE 105* 97  --  125* 91 72  BUN 28* 21  --  14 10 10   CREATININE 0.64 0.56  --  0.43* 0.46 0.42*  CALCIUM 9.3 9.0  --  9.0 8.6* 8.7*  MG  --   --  2.0 1.9  --  1.7  PHOS  --   --   --  1.6*  --  2.5   GFR: Estimated Creatinine Clearance: 45.6 mL/min (A) (by C-G formula based on SCr of 0.42 mg/dL (L)). Liver Function Tests: Recent Labs  Lab  01/13/21 2014 01/14/21 0409  AST 27 23  ALT 15 14  ALKPHOS 54 55  BILITOT 1.0 0.7  PROT 8.0 7.7  ALBUMIN 3.2* 3.1*   Recent Labs  Lab 01/13/21 2014  LIPASE 37   No results for input(s): AMMONIA in the last 168 hours. Coagulation Profile: Recent Labs  Lab 01/14/21 0409  INR 1.0   Cardiac Enzymes: No results for input(s): CKTOTAL, CKMB, CKMBINDEX, TROPONINI in the last 168 hours. BNP (last 3 results) No results for input(s): PROBNP in the last 8760 hours. HbA1C: No results for input(s): HGBA1C in the last 72 hours. CBG: Recent Labs  Lab 01/15/21 1334 01/15/21 1507  GLUCAP 112* 95   Lipid Profile: No results for input(s): CHOL, HDL, LDLCALC, TRIG, CHOLHDL, LDLDIRECT in the last 72 hours. Thyroid Function Tests: No results for input(s): TSH, T4TOTAL, FREET4, T3FREE, THYROIDAB in the last 72 hours. Anemia Panel: No results for input(s): VITAMINB12, FOLATE, FERRITIN, TIBC, IRON, RETICCTPCT in the last 72 hours. Urine analysis:    Component Value Date/Time   COLORURINE AMBER (A) 01/13/2021 2309   APPEARANCEUR CLOUDY (A) 01/13/2021 2309   LABSPEC 1.025 01/13/2021 2309   PHURINE 5.0 01/13/2021 2309   GLUCOSEU NEGATIVE 01/13/2021 2309   HGBUR SMALL (A) 01/13/2021 2309   BILIRUBINUR NEGATIVE 01/13/2021 2309   KETONESUR 20 (A) 01/13/2021 2309   PROTEINUR 100 (A) 01/13/2021 2309   NITRITE POSITIVE (A) 01/13/2021 2309   LEUKOCYTESUR MODERATE (A) 01/13/2021 2309   )No results found for this or any previous visit (from the past 240 hour(s)).  Radiology Studies: No results found.  Scheduled Meds:  metoprolol tartrate  2.5 mg Intravenous Q6H   pantoprazole  40 mg Oral BID   scopolamine  1 patch Transdermal Q72H   Continuous Infusions:   LOS: 6 days   Time spent: 35 minutes  Shayanna Thatch 01/15/2021, MD [facility] If 7PM-7AM, please contact night-coverage [email or web address] Password [facility] 01/19/2021, 12:04 PM

## 2021-01-20 ENCOUNTER — Inpatient Hospital Stay (HOSPITAL_COMMUNITY): Payer: Medicare Other

## 2021-01-20 DIAGNOSIS — R1310 Dysphagia, unspecified: Secondary | ICD-10-CM | POA: Diagnosis not present

## 2021-01-20 DIAGNOSIS — E8809 Other disorders of plasma-protein metabolism, not elsewhere classified: Secondary | ICD-10-CM | POA: Diagnosis not present

## 2021-01-20 DIAGNOSIS — R1010 Upper abdominal pain, unspecified: Secondary | ICD-10-CM | POA: Diagnosis not present

## 2021-01-20 DIAGNOSIS — E860 Dehydration: Secondary | ICD-10-CM | POA: Diagnosis not present

## 2021-01-20 DIAGNOSIS — Z515 Encounter for palliative care: Secondary | ICD-10-CM

## 2021-01-20 DIAGNOSIS — R1915 Other abnormal bowel sounds: Secondary | ICD-10-CM

## 2021-01-20 DIAGNOSIS — K220 Achalasia of cardia: Secondary | ICD-10-CM | POA: Diagnosis not present

## 2021-01-20 DIAGNOSIS — Z7189 Other specified counseling: Secondary | ICD-10-CM | POA: Diagnosis not present

## 2021-01-20 DIAGNOSIS — E46 Unspecified protein-calorie malnutrition: Secondary | ICD-10-CM | POA: Diagnosis not present

## 2021-01-20 LAB — CREATININE, SERUM
Creatinine, Ser: 0.63 mg/dL (ref 0.44–1.00)
GFR, Estimated: >60 mL/min (ref >60)

## 2021-01-20 LAB — COMPREHENSIVE METABOLIC PANEL
ALT: 15 U/L (ref 0–44)
AST: 27 U/L (ref 15–41)
Albumin: 2.9 g/dL — ABNORMAL LOW (ref 3.5–5.0)
Alkaline Phosphatase: 65 U/L (ref 38–126)
Anion gap: 14 (ref 5–15)
BUN: 14 mg/dL (ref 8–23)
CO2: 20 mmol/L — ABNORMAL LOW (ref 22–32)
Calcium: 8.9 mg/dL (ref 8.9–10.3)
Chloride: 103 mmol/L (ref 98–111)
Creatinine, Ser: 0.59 mg/dL (ref 0.44–1.00)
GFR, Estimated: >60 mL/min (ref >60)
Glucose, Bld: 71 mg/dL (ref 70–99)
Potassium: 3.8 mmol/L (ref 3.5–5.1)
Sodium: 137 mmol/L (ref 135–145)
Total Bilirubin: 1.1 mg/dL (ref 0.3–1.2)
Total Protein: 7.4 g/dL (ref 6.5–8.1)

## 2021-01-20 LAB — CBC WITH DIFFERENTIAL/PLATELET
Abs Immature Granulocytes: 0.04 10*3/uL (ref 0.00–0.07)
Basophils Absolute: 0 10*3/uL (ref 0.0–0.1)
Basophils Relative: 0 %
Eosinophils Absolute: 0 10*3/uL (ref 0.0–0.5)
Eosinophils Relative: 0 %
HCT: 36.3 % (ref 36.0–46.0)
Hemoglobin: 11.5 g/dL — ABNORMAL LOW (ref 12.0–15.0)
Immature Granulocytes: 0 %
Lymphocytes Relative: 7 %
Lymphs Abs: 0.7 10*3/uL (ref 0.7–4.0)
MCH: 32.1 pg (ref 26.0–34.0)
MCHC: 31.7 g/dL (ref 30.0–36.0)
MCV: 101.4 fL — ABNORMAL HIGH (ref 80.0–100.0)
Monocytes Absolute: 0.6 10*3/uL (ref 0.1–1.0)
Monocytes Relative: 6 %
Neutro Abs: 8.9 10*3/uL — ABNORMAL HIGH (ref 1.7–7.7)
Neutrophils Relative %: 87 %
Platelets: 176 10*3/uL (ref 150–400)
RBC: 3.58 MIL/uL — ABNORMAL LOW (ref 3.87–5.11)
RDW: 14.9 % (ref 11.5–15.5)
WBC: 10.2 10*3/uL (ref 4.0–10.5)
nRBC: 0 % (ref 0.0–0.2)

## 2021-01-20 LAB — LIPASE, BLOOD: Lipase: 118 U/L — ABNORMAL HIGH (ref 11–51)

## 2021-01-20 MED ORDER — SIMETHICONE 80 MG PO CHEW
80.0000 mg | CHEWABLE_TABLET | Freq: Once | ORAL | Status: AC
  Administered 2021-01-20: 80 mg via ORAL
  Filled 2021-01-20: qty 1

## 2021-01-20 NOTE — Consult Note (Addendum)
[facility] Consult  Reason for Consult: Malnutrition, 30 lbs weight loss, ? Achalasia, ? Gastroparesis  Referring Physician:  Dr. Johnson/ Dr. Castaneda   Chief Complaint   Abdominal Pain     HPI: Kathryn [name] Imm is a 80 y.o. female with DM, HTN, incidental COVID positive 6/30, positive ANA and SSA, rheuamtology appt 8/1, prior diagnosis of achalasia in 2014 who comes in with 2 months of poor intake and weight loss of 30 lbs. She has been getting weaker and more frail. She has been evaluated for possible achalasia and reported has had manometry in the past (GI is getting information) and had Botox injection with Dr. Rehman 7/21 for a hypertonic LES without improvement. He also noted a lot of fluid in the stomach and is concerned about gastroparesis. She is not tolerating clears and has not been able to make it to her outpatient work up due to poor intake and having issues with oral secretions and spitting.   She is suspected to have scleroderma/ SLE/ Sjogren's but nothing has been confirmed.  Cts have never shown any dilated or obstructed bowel but her Xray and esophagram have shown contrast still in her stomach indicating some degree of gastric emptying and Xray with colonic ileus.    It is not felt that she will tolerate a formal gastric emptying study.  Scintography would only be an option as outpatient which patient is not going to tolerate currently because minimal intake.   Past Medical History:  Diagnosis Date   Acid reflux    Diabetes (HCC)    controlled with diet.    HTN (hypertension)     Past Surgical History:  Procedure Laterality Date   ABDOMINAL HYSTERECTOMY     BOTOX INJECTION N/A 01/15/2021   Procedure: BOTOX INJECTION;  Surgeon: Carver, Charles K, DO;  Location: [facility];  Service: Endoscopy;  Laterality: N/A;   CHOLECYSTECTOMY     ESOPHAGEAL DILATION N/A 10/24/2020   Procedure: ESOPHAGEAL DILATION;  Surgeon: Rourk, Robert M, MD;  Location: [facility]  END[facility];  Service: Endoscopy;  Laterality: N/A;   ESOPHAGOGASTRODUODENOSCOPY N/A 10/24/2020   Procedure: ESOPHAGOGASTRODUODENOSCOPY (EGD);  Surgeon: Rourk, Robert M, MD;  Location: [facility];  Service: Endoscopy;  Laterality: N/A;   ESOPHAGOGASTRODUODENOSCOPY (EGD) WITH PROPOFOL N/A 01/15/2021   Procedure: ESOPHAGOGASTRODUODENOSCOPY (EGD) WITH PROPOFOL;  Surgeon: Carver, Charles K, DO;  Location: [facility];  Service: Endoscopy;  Laterality: N/A;   Histosalpingogram      Family History  Problem Relation Age of Onset   Cancer Other    Diabetes Other    Arthritis Other    Colon cancer Father        diagnosed in his late 60s.   Esophageal cancer Neg Hx    Stomach cancer Neg Hx     Social History   Tobacco Use   Smoking status: Never   Smokeless tobacco: Never  Vaping Use   Vaping Use: Never used  Substance Use Topics   Alcohol use: No   Drug use: No    Medications: I have reviewed the patient's current medications. Prior to Admission:  Medications Prior to Admission  Medication Sig Dispense Refill Last Dose   losartan (COZAAR) 50 MG tablet Take 50 mg by mouth daily.   Past Week   metoprolol tartrate (LOPRESSOR) 25 MG tablet Take 1 tablet (25 mg total) by mouth 2 (two) times daily. 60 tablet 0 Past Week   metoprolol tartrate (LOPRESSOR) 25 MG tablet Take by mouth.        pantoprazole (PROTONIX) 40 MG tablet Take 40 mg by mouth daily.   Past Week   triamcinolone ointment (KENALOG) 0.1 % Apply topically.      aspirin 81 MG tablet Take 81 mg by mouth daily. (Patient not taking: No sig reported)   Not Taking   Scheduled:  metoprolol tartrate  2.5 mg Intravenous Q6H   pantoprazole  40 mg Oral BID   scopolamine  1 patch Transdermal Q72H   Continuous: VOH:[id number] injection, prochlorperazine  Allergies  Allergen Reactions   Cyclobenzaprine Hcl Swelling     ROS:  A comprehensive review of systems was negative except for: Constitutional: positive for anorexia,  fatigue, and weight loss Gastrointestinal: positive for abdominal pain and dysphagia, difficulty with oral secretions   Blood pressure 130/70, pulse 97, temperature 98.8 F (37.1 C), resp. rate 20, height 5\' 3"  (1.6 [name]), weight 54.4 kg, SpO2 100 %. Physical Exam Vitals reviewed.  Constitutional:      Appearance: She is cachectic.  Eyes:     Extraocular Movements: Extraocular movements intact.  Cardiovascular:     Rate and Rhythm: Normal rate.  Pulmonary:     Effort: Pulmonary effort is normal.  Abdominal:     General: Abdomen is scaphoid. There is no distension.     Palpations: Abdomen is soft. There is no mass.  Skin:    General: Skin is warm.  Neurological:     General: No focal deficit present.     Mental Status: She is oriented to person, place, and time.  Psychiatric:        Mood and Affect: Mood normal.        Behavior: Behavior normal.    Results: Results for orders placed or performed during the hospital encounter of 01/13/21 (from the past 48 hour(s))  Creatinine, serum     Status: None   Collection Time: 01/20/21  4:13 AM  Result Value Ref Range   Creatinine, Ser 0.63 0.44 - 1.00 mg/dL   GFR, Estimated 01/22/21 >26 mL/min    Comment: (NOTE) Calculated using the CKD-EPI Creatinine Equation (2021) Performed at [facility], [address] [place], [place]   CBC with Differential/Platelet     Status: Abnormal   Collection Time: 01/20/21  4:13 AM  Result Value Ref Range   WBC 10.2 4.0 - 10.5 K/uL   RBC 3.58 (L) 3.87 - 5.11 MIL/uL   Hemoglobin 11.5 (L) 12.0 - 15.0 g/dL   HCT 01/22/21 70.3 - 50.0 %   MCV 101.4 (H) 80.0 - 100.0 fL   MCH 32.1 26.0 - 34.0 pg   MCHC 31.7 30.0 - 36.0 g/dL   RDW 93.8 18.2 - 99.3 %   Platelets 176 150 - 400 K/uL   nRBC 0.0 0.0 - 0.2 %   Neutrophils Relative % 87 %   Neutro Abs 8.9 (H) 1.7 - 7.7 K/uL   Lymphocytes Relative 7 %   Lymphs Abs 0.7 0.7 - 4.0 K/uL   Monocytes Relative 6 %   Monocytes Absolute 0.6 0.1 - 1.0 K/uL    Eosinophils Relative 0 %   Eosinophils Absolute 0.0 0.0 - 0.5 K/uL   Basophils Relative 0 %   Basophils Absolute 0.0 0.0 - 0.1 K/uL   Immature Granulocytes 0 %   Abs Immature Granulocytes 0.04 0.00 - 0.07 K/uL    Comment: Performed at [facility], [address], [place], [place]  Comprehensive metabolic panel     Status: Abnormal   Collection Time: 01/20/21  4:13  AM  Result Value Ref Range   Sodium 137 135 - 145 mmol/L   Potassium 3.8 3.5 - 5.1 mmol/L   Chloride 103 98 - 111 mmol/L   CO2 20 (L) 22 - 32 mmol/L   Glucose, Bld 71 70 - 99 mg/dL    Comment: Glucose reference range applies only to samples taken after fasting for at least 8 hours.   BUN 14 8 - 23 mg/dL   Creatinine, Ser 1.96 0.44 - 1.00 mg/dL   Calcium 8.9 8.9 - 22.2 mg/dL   Total Protein 7.4 6.5 - 8.1 g/dL   Albumin 2.9 (L) 3.5 - 5.0 g/dL   AST 27 15 - 41 U/L   ALT 15 0 - 44 U/L   Alkaline Phosphatase 65 38 - 126 U/L   Total Bilirubin 1.1 0.3 - 1.2 mg/dL   GFR, Estimated >97 >98 mL/min    Comment: (NOTE) Calculated using the CKD-EPI Creatinine Equation (2021)    Anion gap 14 5 - 15    Comment: Performed at [facility], [address], [place], [place] [address]  Lipase, blood     Status: Abnormal   Collection Time: 01/20/21  4:13 AM  Result Value Ref Range   Lipase 118 (H) 11 - 51 U/L    Comment: Performed at [facility], [address] [place], [place] [address]    DG Abd 1 View  Result Date: 01/20/2021 CLINICAL DATA:  Abdominal pain. EXAM: ABDOMEN - 1 VIEW COMPARISON:  CT of the abdomen on 12/04/2020, esophagram on 01/14/2021 FINDINGS: There is some residual barium in the stomach from recent esophagram. There is some gaseous distension of the colon in the region of the transverse colon and there may be a component of colonic ileus. No small bowel dilatation or signs of free air. No abnormal calcifications. Clips related to prior cholecystectomy. Degenerative disc disease of the lumbar spine.  IMPRESSION: Potential colonic ileus. Residual barium in the stomach related to recent esophagram 6 days ago. This may implicate some degree poor gastric motility/emptying. Electronically Signed   By: Irish Lack [name].D.   On: 01/20/2021 12:26     Assessment & Plan:  Kathryn [name] is a 80 y.o. female with reported achalasia, gastroparesis, possible scleroderma/ Sjogren's with positive ANA and SLE, who I am worried may have some chronic intestinal dysmotility given the colonic ileus now on Xray and issues overall.  She is sounding more like general dysmotility.   I have discussed the case with the team, discussed the case with IR and discussed the case with a surgeon that does heller myotomy and has experience with dysmotility issues.  I think the best option is a open G tube with pexy and placing the G tube distally to ensure it will not compromise a wrap in the future if needed for the esophagus. The open G tube can be tried for feeds but given the gastroparesis this is unlikely to be successful, and then can be used for decompression. The G tube could also be used for motility medications that she is currently not able to take due to the esophageal motility issues.   I have spoke with IR and they would be willing do convert the open G tube to GJ with a catheter they place through the G after about 1 week if pexyed.  This would allow for a trial of distal feeding and decompression of the stomach.  She could then hopefully be worked up with scintography/ capsule for motility and  tried on motility medications.   I have warned the patient that this will not change her symptoms of regurgitation and oral secretion issues/ spitting, and that this will hopefully aid in getting some nutrition in her but that the issue with the chronic intestinal dysmotility is that we do not know how much of her intestine is involved /possible all of it if related to an rheumatologic disorder, and that this all may not work.  Discussed option of no surgery and TPN alone. TPN could also be done if this fails.   Will discuss further with patient and family this week. Likely G tube would be Friday.   All questions were answered to the satisfaction of the patient and family.     Lucretia Roers 01/20/2021, 4:59 PM

## 2021-01-20 NOTE — H&P (View-Only) (Signed)
[facility] Consult  Reason for Consult: Malnutrition, 30 lbs weight loss, ? Achalasia, ? Gastroparesis  Referring Physician:  Dr. Laural BenesJohnson/ Dr. Levon Hedgerastaneda   Chief Complaint   Abdominal Pain     HPI: Kathryn [name] is a 80 y.o. female with DM, HTN, incidental COVID positive 6/30, positive ANA and SSA, rheuamtology appt 8/1, prior diagnosis of achalasia in 2014 who comes in with 2 months of poor intake and weight loss of 30 lbs. She has been getting weaker and more frail. She has been evaluated for possible achalasia and reported has had manometry in the past (GI is getting information) and had Botox injection with Dr. Karilyn Cotaehman 7/21 for a hypertonic LES without improvement. He also noted a lot of fluid in the stomach and is concerned about gastroparesis. She is not tolerating clears and has not been able to make it to her outpatient work up due to poor intake and having issues with oral secretions and spitting.   She is suspected to have scleroderma/ SLE/ Sjogren's but nothing has been confirmed.  Cts have never shown any dilated or obstructed bowel but her Xray and esophagram have shown contrast still in her stomach indicating some degree of gastric emptying and Xray with colonic ileus.    It is not felt that she will tolerate a formal gastric emptying study.  Scintography would only be an option as outpatient which patient is not going to tolerate currently because minimal intake.   Past Medical History:  Diagnosis Date   Acid reflux    Diabetes (HCC)    controlled with diet.    HTN (hypertension)     Past Surgical History:  Procedure Laterality Date   ABDOMINAL HYSTERECTOMY     BOTOX INJECTION N/A 01/15/2021   Procedure: BOTOX INJECTION;  Surgeon: Lanelle Balarver, Charles K, DO;  [facility];  Service: Endoscopy;  Laterality: N/A;   CHOLECYSTECTOMY     ESOPHAGEAL DILATION N/A 10/24/2020   Procedure: ESOPHAGEAL DILATION;  Surgeon: Corbin Adeourk, Robert M, MD;  Location: [facility];  Service: Endoscopy;  Laterality: N/A;   ESOPHAGOGASTRODUODENOSCOPY N/A 10/24/2020   Procedure: ESOPHAGOGASTRODUODENOSCOPY (EGD);  Surgeon: Corbin Adeourk, Robert M, MD;  [facility]: [facility];  Service: Endoscopy;  Laterality: N/A;   ESOPHAGOGASTRODUODENOSCOPY (EGD) WITH PROPOFOL N/A 01/15/2021   Procedure: ESOPHAGOGASTRODUODENOSCOPY (EGD) WITH PROPOFOL;  Surgeon: Lanelle Balarver, Charles K, DO;  Location: [facility];  Service: Endoscopy;  Laterality: N/A;   Histosalpingogram      Family History  Problem Relation Age of Onset   Cancer Other    Diabetes Other    Arthritis Other    Colon cancer Father        diagnosed in his late 3060s.   Esophageal cancer Neg Hx    Stomach cancer Neg Hx     Social History   Tobacco Use   Smoking status: Never   Smokeless tobacco: Never  Vaping Use   Vaping Use: Never used  Substance Use Topics   Alcohol use: No   Drug use: No    Medications: I have reviewed the patient's current medications. Prior to Admission:  Medications Prior to Admission  Medication Sig Dispense Refill Last Dose   losartan (COZAAR) 50 MG tablet Take 50 mg by mouth daily.   Past Week   metoprolol tartrate (LOPRESSOR) 25 MG tablet Take 1 tablet (25 mg total) by mouth 2 (two) times daily. 60 tablet 0 Past Week   metoprolol tartrate (LOPRESSOR) 25 MG tablet Take by mouth.  pantoprazole (PROTONIX) 40 MG tablet Take 40 mg by mouth daily.   Past Week   triamcinolone ointment (KENALOG) 0.1 % Apply topically.      aspirin 81 MG tablet Take 81 mg by mouth daily. (Patient not taking: No sig reported)   Not Taking   Scheduled:  metoprolol tartrate  2.5 mg Intravenous Q6H   pantoprazole  40 mg Oral BID   scopolamine  1 patch Transdermal Q72H   Continuous: VOH:[id number] injection, prochlorperazine  Allergies  Allergen Reactions   Cyclobenzaprine Hcl Swelling     ROS:  A comprehensive review of systems was negative except for: Constitutional: positive for anorexia,  fatigue, and weight loss Gastrointestinal: positive for abdominal pain and dysphagia, difficulty with oral secretions   Blood pressure 130/70, pulse 97, temperature 98.8 F (37.1 C), resp. rate 20, height 5\' 3"  (1.6 m), weight 54.4 kg, SpO2 100 %. Physical Exam Vitals reviewed.  Constitutional:      Appearance: She is cachectic.  Eyes:     Extraocular Movements: Extraocular movements intact.  Cardiovascular:     Rate and Rhythm: Normal rate.  Pulmonary:     Effort: Pulmonary effort is normal.  Abdominal:     General: Abdomen is scaphoid. There is no distension.     Palpations: Abdomen is soft. There is no mass.  Skin:    General: Skin is warm.  Neurological:     General: No focal deficit present.     Mental Status: She is oriented to person, place, and time.  Psychiatric:        Mood and Affect: Mood normal.        Behavior: Behavior normal.    Results: Results for orders placed or performed during the hospital encounter of 01/13/21 (from the past 48 hour(s))  Creatinine, serum     Status: None   Collection Time: 01/20/21  4:13 AM  Result Value Ref Range   Creatinine, Ser 0.63 0.44 - 1.00 mg/dL   GFR, Estimated 01/22/21 >26 mL/min    Comment: (NOTE) Calculated using the CKD-EPI Creatinine Equation (2021) Performed at [facility], [address] [place], [place]   CBC with Differential/Platelet     Status: Abnormal   Collection Time: 01/20/21  4:13 AM  Result Value Ref Range   WBC 10.2 4.0 - 10.5 K/uL   RBC 3.58 (L) 3.87 - 5.11 MIL/uL   Hemoglobin 11.5 (L) 12.0 - 15.0 g/dL   HCT 01/22/21 70.3 - 50.0 %   MCV 101.4 (H) 80.0 - 100.0 fL   MCH 32.1 26.0 - 34.0 pg   MCHC 31.7 30.0 - 36.0 g/dL   RDW 93.8 18.2 - 99.3 %   Platelets 176 150 - 400 K/uL   nRBC 0.0 0.0 - 0.2 %   Neutrophils Relative % 87 %   Neutro Abs 8.9 (H) 1.7 - 7.7 K/uL   Lymphocytes Relative 7 %   Lymphs Abs 0.7 0.7 - 4.0 K/uL   Monocytes Relative 6 %   Monocytes Absolute 0.6 0.1 - 1.0 K/uL    Eosinophils Relative 0 %   Eosinophils Absolute 0.0 0.0 - 0.5 K/uL   Basophils Relative 0 %   Basophils Absolute 0.0 0.0 - 0.1 K/uL   Immature Granulocytes 0 %   Abs Immature Granulocytes 0.04 0.00 - 0.07 K/uL    Comment: Performed at [facility], [address], [place], [place]  Comprehensive metabolic panel     Status: Abnormal   Collection Time: 01/20/21  4:13  AM  Result Value Ref Range   Sodium 137 135 - 145 mmol/L   Potassium 3.8 3.5 - 5.1 mmol/L   Chloride 103 98 - 111 mmol/L   CO2 20 (L) 22 - 32 mmol/L   Glucose, Bld 71 70 - 99 mg/dL    Comment: Glucose reference range applies only to samples taken after fasting for at least 8 hours.   BUN 14 8 - 23 mg/dL   Creatinine, Ser 1.96 0.44 - 1.00 mg/dL   Calcium 8.9 8.9 - 22.2 mg/dL   Total Protein 7.4 6.5 - 8.1 g/dL   Albumin 2.9 (L) 3.5 - 5.0 g/dL   AST 27 15 - 41 U/L   ALT 15 0 - 44 U/L   Alkaline Phosphatase 65 38 - 126 U/L   Total Bilirubin 1.1 0.3 - 1.2 mg/dL   GFR, Estimated >97 >98 mL/min    Comment: (NOTE) Calculated using the CKD-EPI Creatinine Equation (2021)    Anion gap 14 5 - 15    Comment: Performed at [facility], [address], [place], [place] [address]  Lipase, blood     Status: Abnormal   Collection Time: 01/20/21  4:13 AM  Result Value Ref Range   Lipase 118 (H) 11 - 51 U/L    Comment: Performed at [facility], [address] [place], [place] [address]    DG Abd 1 View  Result Date: 01/20/2021 CLINICAL DATA:  Abdominal pain. EXAM: ABDOMEN - 1 VIEW COMPARISON:  CT of the abdomen on 12/04/2020, esophagram on 01/14/2021 FINDINGS: There is some residual barium in the stomach from recent esophagram. There is some gaseous distension of the colon in the region of the transverse colon and there may be a component of colonic ileus. No small bowel dilatation or signs of free air. No abnormal calcifications. Clips related to prior cholecystectomy. Degenerative disc disease of the lumbar spine.  IMPRESSION: Potential colonic ileus. Residual barium in the stomach related to recent esophagram 6 days ago. This may implicate some degree poor gastric motility/emptying. Electronically Signed   By: Irish Lack M.D.   On: 01/20/2021 12:26     Assessment & Plan:  MOYINOLUWA DAWE is a 80 y.o. female with reported achalasia, gastroparesis, possible scleroderma/ Sjogren's with positive ANA and SLE, who I am worried may have some chronic intestinal dysmotility given the colonic ileus now on Xray and issues overall.  She is sounding more like general dysmotility.   I have discussed the case with the team, discussed the case with IR and discussed the case with a surgeon that does heller myotomy and has experience with dysmotility issues.  I think the best option is a open G tube with pexy and placing the G tube distally to ensure it will not compromise a wrap in the future if needed for the esophagus. The open G tube can be tried for feeds but given the gastroparesis this is unlikely to be successful, and then can be used for decompression. The G tube could also be used for motility medications that she is currently not able to take due to the esophageal motility issues.   I have spoke with IR and they would be willing do convert the open G tube to GJ with a catheter they place through the G after about 1 week if pexyed.  This would allow for a trial of distal feeding and decompression of the stomach.  She could then hopefully be worked up with scintography/ capsule for motility and  tried on motility medications.   I have warned the patient that this will not change her symptoms of regurgitation and oral secretion issues/ spitting, and that this will hopefully aid in getting some nutrition in her but that the issue with the chronic intestinal dysmotility is that we do not know how much of her intestine is involved /possible all of it if related to an rheumatologic disorder, and that this all may not work.  Discussed option of no surgery and TPN alone. TPN could also be done if this fails.   Will discuss further with patient and family this week. Likely G tube would be Friday.   All questions were answered to the satisfaction of the patient and family.     Lucretia Roers 01/20/2021, 4:59 PM

## 2021-01-20 NOTE — Progress Notes (Signed)
Palliative:  Chart reviewed.  Kathryn [name] has had chronic achalasia since 2014.  She has had multiple hospitalizations, 3 in the last 6 months including 4/28 - 30, 6/30 - 7/3.  EGD in April 2022 showed unremarkable esophagus, dilation was done, but patient states that did not help with symptoms.  GI following for recommendations.  Patient and daughter are agreeable to PEG tube placement as she has not had meaningful improvements with current treatment plan.  Daughter's preference is home with home health.  Conference with attending, bedside nursing staff, transition of care team related to patient condition, needs, goals of care, disposition.  Plan: At this point full scope/full code.  Awaiting consult from general surgery regarding placement of G-tube.  Outpatient palliative services to follow.  15 minutes  [name], NP  Palliative medicine team Team phone [phone] Greater than 50% of this time was spent counseling and coordinating care related to the above assessment and plan.

## 2021-01-20 NOTE — Progress Notes (Signed)
Subjective: Patient complaining of generalized, burning abdominal pain this morning, states it started last night and she received some pain medication but does not feel like it provided much relief. She continues to suction oral secretions every few minutes. Patient denies nausea or vomiting. Last BM was on Sunday, very small, per patient. States occasional passing of flatus. States she is just ready to feel better and remains agreeable to surgical consult regarding placement of gastrostomy tube.   Objective: Vital signs in last 24 hours: Temp:  [97.5 F (36.4 C)-97.9 F (36.6 C)] 97.5 F (36.4 C) (07/26 0548) Pulse Rate:  [89-102] 101 (07/26 0548) Resp:  [17] 17 (07/25 2128) BP: (115-142)/(71-75) 132/75 (07/26 0548) SpO2:  [96 %-100 %] 100 % (07/26 0548) Last BM Date: 01/19/21 General:   Alert and oriented, pleasant Mouth:  Without lesions, mucosa pink and moist.  Heart:  S1, S2 present, no murmurs noted.  Lungs: Clear to auscultation bilaterally, without wheezing, rales, or rhonchi.  Abdomen:  Bowel sounds extremely hypoactive, soft, non-distended. No HSM or hernias noted. No rebound or guarding. No masses appreciated. Mild TTP of diffuse abdomen Msk:  Symmetrical without gross deformities. Normal posture. Pulses:  Normal pulses noted. Extremities:  Without edema. Neurologic:  Alert and  oriented x4;  grossly normal neurologically. Skin:  Warm and dry, intact without significant lesions.  Psych:  Alert and cooperative. Normal mood and affect.  Intake/Output from previous day: 07/25 0701 - 07/26 0700 In: 680 [P.O.:680] Out: -  Intake/Output this shift: Total I/O In: 120 [P.O.:120] Out: -   Lab Results: Recent Labs    01/18/21 0554  WBC 9.2  HGB 10.5*  HCT 32.2*  PLT 147*   BMET Recent Labs    01/18/21 0554 01/20/21 0413  NA 137  --   K 3.8  --   CL 105  --   CO2 26  --   GLUCOSE 72  --   BUN 10  --   CREATININE 0.42* 0.63  CALCIUM 8.7*  --      Assessment: Kathryn [name] is an 80 y.o. female with past medical history of achalasia (type II?), positive ANA, SSA, diabetes, and HTN who presented to the hospital on 7/19 after worsening regurgitation, increased oral secretions, dysphagia and poor oral intake with episodes of vomiting over the past two months. She endorsed approximately 30 lbs weight loss during that time.   The patient has a previous diagnosis of achalasia in 2014 (possibly type II per notes) that was managed conservatively, however, symptoms began to worsen earlier this year. EGD done on 7/21 by Dr. Karilyn Cota revealed hypertonic LES and large amount of fluid in the stomach (suctioned), botox injection was performed at that time in attempt to help with symptoms  Patient has reported mild improvement since botox injection on 7/21, however, continues to need to suction oral secretions every few minutes and c/o of ongoing regurgitation of liquids 10-15 minutes after drinking. Consult placed to general surgery for evaluation of gastrostomy tube since patient has seen little improvement in attempt at symptom management thus far.   Suspect there could be some underlying gastroparesis, given large amount of fluid found in stomach during EGD, however, it is likely that patient would be able to tolerate gastric emptying study for further evaluation of this, given her ongoing regurgitation and increased oral secretions, requiring frequent suctioning. PEJ may be more beneficial for patient given questionable gastroparesis.   Awaiting manometry records to definitively determine type of achalasia she was  diagnosed with previously as this could impact further treatment. Heller myotomy or POEM may eventually be needed at tertiary facility.  Previously elevated ANA and SSA, in conjunction with increased oral secretions and GI involvement has been queried related to possible Sjogren's, scleroderma or SLE, no confirmatory diagnosis has been made, to  date.   She is now complaining of some new burning in her abdomen that began last night. She denies any bowel movements since Sunday when she reports having a small BM, minimal passing of flatus currently. She denies any vomiting. Abdomen is soft, non distended, but mildly tender all over with extremely hypoactive bowel sounds. Last CT 6/30 showed some mild diffuse wall thickening involving the cecum, concerning for infectious or inflammatory colitis. Patient however was asymptomatic at that time, therefore no antibiotics were given. Cannot rule out ileus given symptoms, will proceed with Abdominal xray at this time.    Plan: -Will order Abdominal xray -continue clear liquid diet as tolerated -Monitor for improvements in swallowing/dysphagia -continue protein supplements BID -awaiting consult from general surgery regarding placement of gastrostomy tube   LOS: 7 days    01/20/2021, 10:11 AM  Latoya Maulding L. Jeanmarie Hubert, MSN, APRN, AGNP-C Adult-Gerontology Nurse Practitioner [facility]

## 2021-01-20 NOTE — Progress Notes (Signed)
PROGRESS NOTE    Kathryn [name]  TOI:[id number] DOB: 1940-09-13 DOA: 01/13/2021 PCP: Suzan Slick, MD  Outpatient Specialists: Rheumatology (first appointment 01/26/21)  Brief Narrative:   Kathryn [name] is a 80 y.o. female with medical history significant for  hypertension, type 2 diabetes mellitus, GERD, positive ANA and SSA autoantibodies, and chronic achalasia (2014 manometry showed abnormal motility)  who presents to the emergency department due to more than 2 months history of decreased oral intake, generalized weakness and about 3 to 4-week onset of abdominal pain associated with difficulty in being able to swallow (solid and liquid).  She believes that she must have lost up to 30 pounds since onset of symptoms.  Patient endorsed that she usually regurgitates any food/liquid ingested within 15 to 20 minutes of ingestion.  Abdominal pain was in the epigastric area and was sharp in nature, this worsens with food.  She denies fever, chills, chest pain, shortness of breath.   She was admitted to the hospital from 12/25/2020-12/28/2020, at that time barium p.o. esophagram done urgently as an outpatient was indicated.  She was also admitted from 10/23/2020 to 10/25/2020 during which patient presented with proximal muscle weakness with an elevated CPK of 1206 that was concerning for polymyalgia rheumatica. This was treated with prednisone with improvement.  EGD done on 10/04/2020 showed normal dilated esophagus and small hiatal hernia.   In the emergency department, she was intermittently tachypneic and tachycardic.  Other vital signs were within normal range.  Work-up in the ED showed normocytic anemia and leukocytosis, BMP was normal except for elevated BUN at and hyperglycemia with CBG of 135.  Albumin 3.2. CT of head without contrast showed no acute intracranial abnormality. IV hydration was provided, hospitalist was asked to admit patient for further evaluation and management.   7/20 - Patient  still experiences significant epigastric pain. Using suction for oral secretion. No other acute complaints. GI (Dr. Karilyn Cota) was consulted and will provide further recommendations. Of note, patient has not been able to follow up with outpatient rheumatology and has not been receiving treatments    7/21 - Patient is clinically stable. EGD with botox treatment was successfully completed.   7/22 - Patient continues to have difficulty swallowing.  Began discussion with patient regarding possible need for temporary PEG placement if needed.   7/24-7/25 - pt making trials of liquids but only tolerating small amounts of liquids.    7/26 - recurrent abdominal pain, unable to take any liquids today.  Still suctioning frequently.  Agreeable to PEG consultation and placement.   Assessment & Plan:   Principal Problem:   Abdominal pain Active Problems:   HTN (hypertension)   Dysphagia   Nausea and vomiting   Generalized weakness   Dehydration   Normocytic anemia   Leukocytosis   Hyperglycemia   Hypoalbuminemia due to protein-calorie malnutrition (HCC)   Prolonged QT interval   Achalasia   Malnutrition of moderate degree   Hypoactive bowel sounds  Epigastric/Abdominal pain, nausea and vomiting in the setting of esophageal dysphagia Patient is ANA/SSA antibody positive. Sjogren's syndrome and SLE are on the differential. Patient also has had increased oral secretion, suggestive of sicca-related symptoms. Gastrointestinal involvement of SLE can manifest as esophageal motility disorder, such as achalasia, which has been suggesting in this patient. Other GI manifestations of SLE include acute pancreatitis, for which  patient signs and symptoms are concerning, but CT 6/30 has shown no acute pancreatic findings. EGD in April 2022 showed unremarkable esophagus, and patient  underwent dialtion given history of dysphagia, but patient states that did not help with symptoms.   -Continue IV morphine 0.5 mg q.4h  p.r.n. for severe pain -Continue Protonix 40 mg po BID -Continue clear liquid diet with plan to advanced diet as tolerated -Gastroenterology following for further recommendations -ongoing goals of care discussions with palliative team, patient and daughter regarding tube feeding -Pt agrees to PEG placement  - KUB pending for this morning to assess recurrent abdominal pain symptoms   Generalized weakness and dehydration in the setting of above -Hypoalbuminemia secondary to mild protein calorie malnutrition -Continue IV hydration as described above -Continue protein supplement - surgery consult for PEG placement   Urinary tract infection - TREATED - UA in the ED showed many bacteria, moderate ketones, nitrites -Patient is on ceftriaxone 1g for coverage (completed on 7/23)  Normocytic anemia -Chronic and Stable  Leukocytosis/hyperglycemia possibly reactive - WBC normalized - Continue to monitor WBC and blood glucose with morning labs   Prolonged QTc (RESOLVED) - Avoid QT prolonging drugs - Magnesium 1.9 - Repeated EKG with resolution of prolonged QTc    COVID-19 infection (incidental finding) -Patient is asymptomatic and stable on room air -This was already diagnosed during last admission   Essential hypertension -Continue Lopressor but since can't take tabs now, we changed it to IV lopressor 2.5 mg Q6h until she can take the tablets again  Hyperglycemia secondary to T2DM -Hemoglobin A1c on 6/30-5.8%   GERD -Continue Protonix    DVT prophylaxis: SCD Code Status: Full Family Communication: Phone update to daughter Winnifred 7/25 Disposition Plan: SNF or family member's home   Consultants:  Gastroenterology  Palliative care Surgery (regarding PEG placement)   Procedures:  Barium swallow 7/20 EGD with botox injection 7/21  Consultants:  GI  Subjective: Pt c/o abdominal pain, unable to tolerate any clear liquid this morning, agreeable to PEG.    Objective: Vitals:   01/19/21 2128 01/19/21 2322 01/19/21 2327 01/20/21 0548  BP: 124/71 (!) 142/73 130/74 132/75  Pulse: 98 (!) 102 89 (!) 101  Resp: 17     Temp: 97.9 F (36.6 C)   (!) 97.5 F (36.4 C)  TempSrc: Oral   Oral  SpO2: 99%   100%  Weight:      Height:        Intake/Output Summary (Last 24 hours) at 01/20/2021 1157 Last data filed at 01/20/2021 0900 Gross per 24 hour  Intake 800 ml  Output --  Net 800 ml   Filed Weights   01/13/21 1909  Weight: 54.4 kg   Examination:  General exam: frail, emaciated appearing female, Appears calm and comfortable  Respiratory system: Clear to auscultation. Respiratory effort normal. Cardiovascular system: S1 & S2 heard.  No JVD, murmurs, rubs, gallops or clicks. No pedal edema. Gastrointestinal system: Abdomen is nondistended, tender to light palpation. No organomegaly or masses felt. Normal bowel sounds heard. Central nervous system: Alert and oriented. No focal neurological deficits. Extremities: Symmetric 5 x 5 power. Skin: No rashes, lesions or ulcers Psychiatry: Judgement and insight appear diminished. Mood & affect flat/depressed.   Data Reviewed: I have personally reviewed following labs and imaging studies  CBC: Recent Labs  Lab 01/13/21 2014 01/14/21 0409 01/15/21 0350 01/16/21 0445 01/17/21 0548 01/18/21 0554 01/20/21 0413  WBC 11.5*   < > 10.2 9.2 8.5 9.2 10.2  NEUTROABS 10.3*  --   --   --   --   --  8.9*  HGB 11.2*   < > 10.6*  9.9* 10.7* 10.5* 11.5*  HCT 34.7*   < > 33.8* 31.3* 32.2* 32.2* 36.3  MCV 98.6   < > 100.6* 100.3* 99.4 98.8 101.4*  PLT 171   < > 165 148* 143* 147* 176   < > = values in this interval not displayed.   Basic Metabolic Panel: Recent Labs  Lab 01/15/21 0350 01/15/21 0712 01/16/21 0445 01/17/21 0548 01/18/21 0554 01/20/21 0413  NA 146*  --  143 138 137 137  K 3.0*  --  3.4* 3.3* 3.8 3.8  CL 109  --  110 104 105 103  CO2 26  --  28 29 26  20*  GLUCOSE 97  --  125* 91 72  71  BUN 21  --  14 10 10 14   CREATININE 0.56  --  0.43* 0.46 0.42* 0.59  0.63  CALCIUM 9.0  --  9.0 8.6* 8.7* 8.9  MG  --  2.0 1.9  --  1.7  --   PHOS  --   --  1.6*  --  2.5  --    GFR: Estimated Creatinine Clearance: 45.6 mL/min (by C-G formula based on SCr of 0.63 mg/dL). Liver Function Tests: Recent Labs  Lab 01/13/21 2014 01/14/21 0409 01/20/21 0413  AST 27 23 27   ALT 15 14 15   ALKPHOS 54 55 65  BILITOT 1.0 0.7 1.1  PROT 8.0 7.7 7.4  ALBUMIN 3.2* 3.1* 2.9*   Recent Labs  Lab 01/13/21 2014 01/20/21 0413  LIPASE 37 118*   No results for input(s): AMMONIA in the last 168 hours. Coagulation Profile: Recent Labs  Lab 01/14/21 0409  INR 1.0   Cardiac Enzymes: No results for input(s): CKTOTAL, CKMB, CKMBINDEX, TROPONINI in the last 168 hours. BNP (last 3 results) No results for input(s): PROBNP in the last 8760 hours. HbA1C: No results for input(s): HGBA1C in the last 72 hours. CBG: Recent Labs  Lab 01/15/21 1334 01/15/21 1507  GLUCAP 112* 95   Lipid Profile: No results for input(s): CHOL, HDL, LDLCALC, TRIG, CHOLHDL, LDLDIRECT in the last 72 hours. Thyroid Function Tests: No results for input(s): TSH, T4TOTAL, FREET4, T3FREE, THYROIDAB in the last 72 hours. Anemia Panel: No results for input(s): VITAMINB12, FOLATE, FERRITIN, TIBC, IRON, RETICCTPCT in the last 72 hours. Urine analysis:    Component Value Date/Time   COLORURINE AMBER (A) 01/13/2021 2309   APPEARANCEUR CLOUDY (A) 01/13/2021 2309   LABSPEC 1.025 01/13/2021 2309   PHURINE 5.0 01/13/2021 2309   GLUCOSEU NEGATIVE 01/13/2021 2309   HGBUR SMALL (A) 01/13/2021 2309   BILIRUBINUR NEGATIVE 01/13/2021 2309   KETONESUR 20 (A) 01/13/2021 2309   PROTEINUR 100 (A) 01/13/2021 2309   NITRITE POSITIVE (A) 01/13/2021 2309   LEUKOCYTESUR MODERATE (A) 01/13/2021 2309   )No results found for this or any previous visit (from the past 240 hour(s)).  Radiology Studies: No results found.  Scheduled  Meds:  metoprolol tartrate  2.5 mg Intravenous Q6H   pantoprazole  40 mg Oral BID   scopolamine  1 patch Transdermal Q72H   Continuous Infusions:   LOS: 7 days   Time spent: 35 minutes  Vilas Edgerly, MD [facility] If 7PM-7AM, please contact night-coverage [email or web address] Password [facility] 01/20/2021, 11:57 AM

## 2021-01-21 DIAGNOSIS — R1010 Upper abdominal pain, unspecified: Secondary | ICD-10-CM | POA: Diagnosis not present

## 2021-01-21 DIAGNOSIS — R1310 Dysphagia, unspecified: Secondary | ICD-10-CM | POA: Diagnosis not present

## 2021-01-21 DIAGNOSIS — R112 Nausea with vomiting, unspecified: Secondary | ICD-10-CM

## 2021-01-21 DIAGNOSIS — R1915 Other abnormal bowel sounds: Secondary | ICD-10-CM

## 2021-01-21 DIAGNOSIS — E8809 Other disorders of plasma-protein metabolism, not elsewhere classified: Secondary | ICD-10-CM | POA: Diagnosis not present

## 2021-01-21 DIAGNOSIS — E46 Unspecified protein-calorie malnutrition: Secondary | ICD-10-CM | POA: Diagnosis not present

## 2021-01-21 DIAGNOSIS — K220 Achalasia of cardia: Secondary | ICD-10-CM | POA: Diagnosis not present

## 2021-01-21 DIAGNOSIS — E440 Moderate protein-calorie malnutrition: Secondary | ICD-10-CM

## 2021-01-21 MED ORDER — FLEET ENEMA 7-19 GM/118ML RE ENEM
1.0000 | ENEMA | Freq: Once | RECTAL | Status: AC
  Administered 2021-01-21: 1 via RECTAL

## 2021-01-21 MED ORDER — CEFAZOLIN SODIUM-DEXTROSE 2-4 GM/100ML-% IV SOLN
2.0000 g | INTRAVENOUS | Status: AC
  Administered 2021-01-22: 2 g via INTRAVENOUS
  Filled 2021-01-21: qty 100

## 2021-01-21 MED ORDER — ACETAMINOPHEN 325 MG PO TABS
650.0000 mg | ORAL_TABLET | Freq: Four times a day (QID) | ORAL | Status: DC | PRN
  Administered 2021-01-21: 650 mg via ORAL
  Filled 2021-01-21: qty 2

## 2021-01-21 MED ORDER — CHLORHEXIDINE GLUCONATE CLOTH 2 % EX PADS: 6.0000 | MEDICATED_PAD | Freq: Once | CUTANEOUS | Status: DC

## 2021-01-21 MED ORDER — CHLORHEXIDINE GLUCONATE CLOTH 2 % EX PADS
6.0000 | MEDICATED_PAD | Freq: Once | CUTANEOUS | Status: AC
  Administered 2021-01-21: 6 via TOPICAL

## 2021-01-21 MED ORDER — LACTATED RINGERS IV SOLN: INTRAVENOUS | Status: DC

## 2021-01-21 NOTE — Progress Notes (Signed)
Subjective: Patient states she is feeling a little better this morning, states that her dysphagia is about the same, continues to need frequent oral suctioning and able to keep down minimal PO liquids with regurgitation occurring a few minutes after intake. She does endorse that abdominal pain has improved some, states she has not gotten up out of bed since Monday. Endorses some ongoing nausea, denies constipation. Denies diarrhea. Still no BM since Sunday.   Objective: Vital signs in last 24 hours: Temp:  [97.4 F (36.3 C)-98.8 F (37.1 C)] 97.4 F (36.3 C) (07/27 0436) Pulse Rate:  [95-101] 101 (07/27 0436) Resp:  [18-20] 18 (07/27 0436) BP: (115-132)/(65-75) 132/75 (07/27 0436) SpO2:  [100 %] 100 % (07/27 0436) Last BM Date: 01/19/21 General:   Alert and oriented, pleasant Mouth:  Without lesions, mucosa pink and moist.  Neck:  Supple, without thyromegaly or masses.  Heart:  S1, S2 present, no murmurs noted.  Lungs: Clear to auscultation bilaterally, without wheezing, rales, or rhonchi.  Abdomen:  soft, non-distended. No HSM or hernias noted. No rebound or guarding. No masses appreciated. Mild TTP of entire abdomen. Minimal bowel sounds.  Msk:  Symmetrical without gross deformities. Normal posture. Pulses:  Normal pulses noted. Extremities:  Without edema. Neurologic:  Alert and  oriented x4;  grossly normal neurologically. Skin:  Warm and dry, intact without significant lesions.  Psych:  Alert and cooperative. Normal mood and affect.  Intake/Output from previous day: 07/26 0701 - 07/27 0700 In: 120 [P.O.:120] Out: 500 [Urine:500] Intake/Output this shift: No intake/output data recorded.  Lab Results: Recent Labs    01/20/21 0413  WBC 10.2  HGB 11.5*  HCT 36.3  PLT 176   BMET Recent Labs    01/20/21 0413  NA 137  K 3.8  CL 103  CO2 20*  GLUCOSE 71  BUN 14  CREATININE 0.59  0.63  CALCIUM 8.9   LFT Recent Labs    01/20/21 0413  PROT 7.4  ALBUMIN 2.9*   AST 27  ALT 15  ALKPHOS 65  BILITOT 1.1    Studies/Results: DG Abd 1 View  Result Date: 01/20/2021 CLINICAL DATA:  Abdominal pain. EXAM: ABDOMEN - 1 VIEW COMPARISON:  CT of the abdomen on 12/04/2020, esophagram on 01/14/2021 FINDINGS: There is some residual barium in the stomach from recent esophagram. There is some gaseous distension of the colon in the region of the transverse colon and there may be a component of colonic ileus. No small bowel dilatation or signs of free air. No abnormal calcifications. Clips related to prior cholecystectomy. Degenerative disc disease of the lumbar spine. IMPRESSION: Potential colonic ileus. Residual barium in the stomach related to recent esophagram 6 days ago. This may implicate some degree poor gastric motility/emptying. Electronically Signed   By: Irish Lack M.D.   On: 01/20/2021 12:26    Assessment: Kathryn [name] is an 80 year old female with past medical history of achalasia (Type II?), positive ANA, SSA, DM, and HTN who presented to the hospital on 7/19 with worsening regurgitation, increased oral secretions, dysphagia and poor oral intake with episodes of vomiting over the past two months, she also endorses approximately 30 lbs weight loss during that times. Underwent previous EGD with empiric dilation after dilated esophagus with hypertonic LES found but no improvement in symptoms thereafter. During this admission, patient underwent EGD with botox injection on 01/15/21 with presence of hypertonic LES and large amount of fluid in her stomach that was suctioned.   Very little improvement  in symptoms thus far, since botox injection. Patient continues to need frequent oral suctioning of secretions, and is having very little liquid intake by mouth due to dysphagia and regurgitation. Case discussed thoroughly by GI team with Dr. Henreitta Leber from general surgery regarding placement of G tube to provide nutrition as patient is able to tolerate very little nutrition  by mouth. Multidisciplinary team decided that best option for patient would be to proceed with open G tube placement (possibly Friday) to evaluate for possible tolerance of feeding through tube. Given the concern for underlying gastroparesis, this will be assessed further after tube is placed, may need concomitant use of motility agent such as reglan or prucalopride if significant retention of contents occurs.  Plans for surgical correction of achalasia will be determined at later date and will depend on clinical response to above mentioned treatment.  Patient began complaining of worsening abdominal pain yesterday, abd xray revealed ileus. Suspect this is related to lack of physical activity and oral intake. No abdominal distention present, however, bowel sounds still remain minimal. No BMs since Sunday, endorses some nausea without vomiting. Denies much passing of flatus at this time. States abdominal pain has improved slightly. Patient educated on importance of moving and walking around as much as possible, however, she reports she has not gotten out of bed since Monday. Will discuss need to get patient OOB with nursing staff as well.   Plan: -Continue to monitor for improvement of ileus -Get patient out of bed, increase physical activity to help get bowels moving -clear liquid diet as tolerated -Continue protein supplements BID -plan for possible G tube placement on Friday with General Surgery -Will address correction of achalasia at tertiary facility at a later date as this will depend on clinical response from G tube placement.   LOS: 8 days    01/21/2021, 8:34 AM  Kassiah Mccrory L. Jeanmarie Hubert, MSN, APRN, AGNP-C Adult-Gerontology Nurse Practitioner [facility]

## 2021-01-21 NOTE — Progress Notes (Signed)
[facility] Progress Note  6 Days Post-Op  Subjective: Still not tolerating feeds or handling secretions, not able to take meds regularly and spits them up she says.  Objective: Vital signs in last 24 hours: Temp:  [97.4 F (36.3 C)-98.7 F (37.1 C)] 97.7 F (36.5 C) (07/27 1300) Pulse Rate:  [95-106] 106 (07/27 1429) Resp:  [16-19] 16 (07/27 1300) BP: (97-132)/(65-75) 104/66 (07/27 1429) SpO2:  [100 %] 100 % (07/27 1429) Last BM Date: 01/19/21  Intake/Output from previous day: 07/26 0701 - 07/27 0700 In: 120 [P.O.:120] Out: 500 [Urine:500] Intake/Output this shift: Total I/O In: 109.1 [I.V.:109.1] Out: -   General appearance: alert, cooperative, and no distress Resp: normal work breathing GI: soft, scaphoid  Lab Results:  Recent Labs    01/20/21 0413  WBC 10.2  HGB 11.5*  HCT 36.3  PLT 176   BMET Recent Labs    01/20/21 0413  NA 137  K 3.8  CL 103  CO2 20*  GLUCOSE 71  BUN 14  CREATININE 0.59  0.63  CALCIUM 8.9   PT/INR No results for input(s): LABPROT, INR in the last 72 hours.  Studies/Results: DG Abd 1 View  Result Date: 01/20/2021 CLINICAL DATA:  Abdominal pain. EXAM: ABDOMEN - 1 VIEW COMPARISON:  CT of the abdomen on 12/04/2020, esophagram on 01/14/2021 FINDINGS: There is some residual barium in the stomach from recent esophagram. There is some gaseous distension of the colon in the region of the transverse colon and there may be a component of colonic ileus. No small bowel dilatation or signs of free air. No abnormal calcifications. Clips related to prior cholecystectomy. Degenerative disc disease of the lumbar spine. IMPRESSION: Potential colonic ileus. Residual barium in the stomach related to recent esophagram 6 days ago. This may implicate some degree poor gastric motility/emptying. Electronically Signed   By: Irish Lack M.D.   On: 01/20/2021 12:26    Anti-infectives: Anti-infectives (From admission, onward)    Start      Dose/Rate Route Frequency Ordered Stop   01/22/21 0600  ceFAZolin (ANCEF) IVPB 2g/100 mL premix        2 g 200 mL/hr over 30 Minutes Intravenous On call to O.R. 01/21/21 1747 01/23/21 0559   01/14/21 0830  cefTRIAXone (ROCEPHIN) 1 g in sodium chloride 0.9 % 100 mL IVPB        1 g 200 mL/hr over 30 Minutes Intravenous Every 24 hours 01/14/21 0735 01/16/21 1023   01/14/21 0800  fosfomycin (MONUROL) packet 3 g  Status:  Discontinued        3 g Oral  Once 01/14/21 8338 01/14/21 0734       Assessment/Plan: Ms. Malphrus is a 80 yo with achalasia, possible gastroparesis and my fear is a chronic intestinal dysmotility related to an unconfirmed but suspected rheumatologic disorder with positive ANA and SLE.    [place]urgensen discussion with patient, daughter Connye Burkitt and Charity fundraiser. Discussed my concerns about achalasia, gastroparesis, and potential motility issues elsewhere. Discussed that patient has issues with malnutrition and dysphagia and that we would attempt a G tube open to help with the malnutrition. This would not help the dysphagia or issues with oral secretions etc. The G tube will allow her to take in meds and possibly nutrition.    Plan for Open G tube to allow for bigger tube to be placed on the distal stomach away from the fundus so that if she qualifies for any surgery or procedure for her achalasia that this is  possible.  This will also help in allowing IR to place a J tube through the G tube, creating the option of a GJ to allow for G venting and meds and J feeds if she does not tolerate the G feeds due to gastroparesis.  I have warned that if she has dysmotility of more of her intestine she may not tolerate the J feeds and may have to get TPN. Also discussed no surgery and straight to TPN but benefits of attempting enteral feeds.   Discussed risk of bleeding, infection, injury to organs, need for G tube to stay in and be pexyed, potential for another procedure with IR for GJ conversion, and if she  qualifies some future procedure for her achalasia at tertiary facility.   Greater than 50% of the 35 minute visit was spent in counseling/ coordination of care regarding the above conversation and plans for surgery.      LOS: 8 days    Lucretia Roers 01/21/2021

## 2021-01-21 NOTE — Progress Notes (Signed)
PROGRESS NOTE    Kathryn [name]  ZOX:[id number] DOB: 1941/05/25 DOA: 01/13/2021 PCP: Kathryn [name], Kathryn [name], Kathryn [name]   Brief Narrative:   Kathryn [name] is a 80 [name] with medical history significant for  hypertension, type 2 diabetes mellitus, GERD, positive ANA and SSA autoantibodies, and chronic achalasia (2014 manometry showed abnormal motility)  who presents to the emergency department due to more than 2 months history of decreased oral intake, generalized weakness and about 3 to 4-week onset of abdominal pain associated with difficulty in being able to swallow (solid and liquid).  She believes that she must have lost up to 30 pounds since onset of symptoms.  Patient endorsed that she usually regurgitates any food/liquid ingested within 15 to 20 minutes of ingestion.  Abdominal pain was in the epigastric area and was sharp in nature, this worsens with food.  She denies fever, chills, chest pain, shortness of breath.   She was admitted to the hospital from 12/25/2020-12/28/2020, at that time barium p.o. esophagram done urgently as an outpatient was indicated.  She was also admitted from 10/23/2020 to 10/25/2020 during which patient presented with proximal muscle weakness with an elevated CPK of 1206 that was concerning for polymyalgia rheumatica. This was treated with prednisone with improvement.  EGD done on 10/04/2020 showed normal dilated esophagus and small hiatal hernia.   In the emergency department, she was intermittently tachypneic and tachycardic.  Other vital signs were within normal range.  Work-up in the ED showed normocytic anemia and leukocytosis, BMP was normal except for elevated BUN at and hyperglycemia with CBG of 135.  Albumin 3.2. CT of head without contrast showed no acute intracranial abnormality. IV hydration was provided, hospitalist was asked to admit patient for further evaluation and management.   7/20 - Patient still experiences significant epigastric pain. Using suction for  oral secretion. No other acute complaints. GI (Dr. Karilyn Cotaehman) was consulted and will provide further recommendations. Of note, patient has not been able to follow up with outpatient rheumatology and has not been receiving treatments    7/21 - Patient is clinically stable. EGD with botox treatment was successfully completed.   7/22 - Patient continues to have difficulty swallowing.  Began discussion with patient regarding possible need for temporary PEG placement if needed.   7/24-7/25 - pt making trials of liquids but only tolerating small amounts of liquids.     7/26 -7/27- recurrent abdominal pain, unable to take any liquids today.  Still suctioning frequently.  Agreeable to PEG consultation and placement. Will start IVF since intake is minimal.  Assessment & Plan:   Principal Problem:   Abdominal pain Active Problems:   HTN (hypertension)   Dysphagia   Nausea and vomiting   Generalized weakness   Dehydration   Normocytic anemia   Leukocytosis   Hyperglycemia   Hypoalbuminemia due to protein-calorie malnutrition (HCC)   Prolonged QT interval   Achalasia   Malnutrition of moderate degree   Hypoactive bowel sounds   Epigastric/Abdominal pain, nausea and vomiting in the setting of esophageal dysphagia Patient is ANA/SSA antibody positive. Sjogren's syndrome and SLE are on the differential. Patient also has had increased oral secretion, suggestive of sicca-related symptoms. Gastrointestinal involvement of SLE can manifest as esophageal motility disorder, such as achalasia, which has been suggesting in this patient. Other GI manifestations of SLE include acute pancreatitis, for which  patient signs and symptoms are concerning, but CT 6/30 has shown no acute pancreatic findings. EGD in April 2022 showed unremarkable esophagus,  and patient underwent dialtion given history of dysphagia, but patient states that did not help with symptoms.   -Continue IV morphine 0.5 mg q.4h p.r.n. for severe  pain -Continue Protonix 40 mg po BID -Continue clear liquid diet with plan to advanced diet as tolerated -Gastroenterology following for further recommendations -ongoing goals of care discussions with palliative team, patient and daughter regarding tube feeding -Pt agrees to G-tube placement and will likely be done by 7/29   Generalized weakness and dehydration in the setting of above -Hypoalbuminemia secondary to mild protein calorie malnutrition -Continue IV hydration as described above -Continue protein supplement - surgery consult for PEG placement likely 7/29   Urinary tract infection - TREATED - UA in the ED showed many bacteria, moderate ketones, nitrites -Patient is on ceftriaxone 1g for coverage (completed on 7/23)  Normocytic anemia -Chronic and Stable  Leukocytosis/hyperglycemia possibly reactive - WBC normalized - Continue to monitor WBC and blood glucose with morning labs   Prolonged QTc (RESOLVED) - Avoid QT prolonging drugs - Magnesium 1.9 - Repeated EKG with resolution of prolonged QTc   COVID-19 infection (incidental finding) -Patient is asymptomatic and stable on room air -This was already diagnosed during last admission   Essential hypertension -Continue Lopressor but since can't take tabs now, we changed it to IV lopressor 2.5 mg Q6h until she can take the tablets again  Hyperglycemia secondary to T2DM -Hemoglobin A1c on 6/30-5.8%   GERD -Continue Protonix   DVT prophylaxis:SCD Code Status: Full Family Communication: Sister at bedside 7/27 Disposition Plan:  Status is: Inpatient  Remains inpatient appropriate because:Ongoing diagnostic testing needed not appropriate for outpatient work up and IV treatments appropriate due to intensity of illness or inability to take PO  Dispo: The patient is from: Home              Anticipated d/c is to: SNF              Patient currently is not medically stable to d/c.   Difficult to place patient  No  Consultants:  Gastroenterology Palliative care Surgery (regarding PEG placement)  Procedures:  Barium swallow 7/20 EGD with botox injection 7/21  Antimicrobials:  Anti-infectives (From admission, onward)    Start     Dose/Rate Route Frequency Ordered Stop   01/14/21 0830  cefTRIAXone (ROCEPHIN) 1 g in sodium chloride 0.9 % 100 mL IVPB        1 g 200 mL/hr over 30 Minutes Intravenous Every 24 hours 01/14/21 0735 01/16/21 1023   01/14/21 0800  fosfomycin (MONUROL) packet 3 g  Status:  Discontinued        3 g Oral  Once 01/14/21 8144 01/14/21 0734       Subjective: Patient seen and evaluated today with no new acute complaints or concerns. No acute concerns or events noted overnight.  She continues to have to frequently suction and is able to only keep down minimal amounts of p.o. fluids.  Objective: Vitals:   01/21/21 0020 01/21/21 0436 01/21/21 1300 01/21/21 1429  BP: 115/70 132/75 97/69 104/66  Pulse: 95 (!) 101  (!) 106  Resp:  18 16   Temp:  (!) 97.4 F (36.3 C) 97.7 F (36.5 C)   TempSrc:  Oral    SpO2:  100% 100% 100%  Weight:      Height:        Intake/Output Summary (Last 24 hours) at 01/21/2021 1440 Last data filed at 01/21/2021 0900 Gross per 24 hour  Intake 0  ml  Output 500 ml  Net -500 ml   Filed Weights   01/13/21 1909  Weight: 54.4 kg    Examination:  General exam: Appears calm and comfortable  Respiratory system: Clear to auscultation. Respiratory effort normal. Cardiovascular system: S1 & S2 heard, RRR.  Gastrointestinal system: Abdomen is soft Central nervous system: Alert and awake Extremities: No edema Skin: No significant lesions noted Psychiatry: Flat affect.    Data Reviewed: I have personally reviewed following labs and imaging studies  CBC: Recent Labs  Lab 01/15/21 0350 01/16/21 0445 01/17/21 0548 01/18/21 0554 01/20/21 0413  WBC 10.2 9.2 8.5 9.2 10.2  NEUTROABS  --   --   --   --  8.9*  HGB 10.6* 9.9* 10.7* 10.5*  11.5*  HCT 33.8* 31.3* 32.2* 32.2* 36.3  MCV 100.6* 100.3* 99.4 98.8 101.4*  PLT 165 148* 143* 147* 176   Basic Metabolic Panel: Recent Labs  Lab 01/15/21 0350 01/15/21 0712 01/16/21 0445 01/17/21 0548 01/18/21 0554 01/20/21 0413  NA 146*  --  143 138 137 137  K 3.0*  --  3.4* 3.3* 3.8 3.8  CL 109  --  110 104 105 103  CO2 26  --  28 29 26  20*  GLUCOSE 97  --  125* 91 72 71  BUN 21  --  14 10 10 14   CREATININE 0.56  --  0.43* 0.46 0.42* 0.59  0.63  CALCIUM 9.0  --  9.0 8.6* 8.7* 8.9  MG  --  2.0 1.9  --  1.7  --   PHOS  --   --  1.6*  --  2.5  --    GFR: Estimated Creatinine Clearance: 45.6 mL/min (by C-G formula based on SCr of 0.63 mg/dL). Liver Function Tests: Recent Labs  Lab 01/20/21 0413  AST 27  ALT 15  ALKPHOS 65  BILITOT 1.1  PROT 7.4  ALBUMIN 2.9*   Recent Labs  Lab 01/20/21 0413  LIPASE 118*   No results for input(s): AMMONIA in the last 168 hours. Coagulation Profile: No results for input(s): INR, PROTIME in the last 168 hours. Cardiac Enzymes: No results for input(s): CKTOTAL, CKMB, CKMBINDEX, TROPONINI in the last 168 hours. BNP (last 3 results) No results for input(s): PROBNP in the last 8760 hours. HbA1C: No results for input(s): HGBA1C in the last 72 hours. CBG: Recent Labs  Lab 01/15/21 1334 01/15/21 1507  GLUCAP 112* 95   Lipid Profile: No results for input(s): CHOL, HDL, LDLCALC, TRIG, CHOLHDL, LDLDIRECT in the last 72 hours. Thyroid Function Tests: No results for input(s): TSH, T4TOTAL, FREET4, T3FREE, THYROIDAB in the last 72 hours. Anemia Panel: No results for input(s): VITAMINB12, FOLATE, FERRITIN, TIBC, IRON, RETICCTPCT in the last 72 hours. Sepsis Labs: No results for input(s): PROCALCITON, LATICACIDVEN in the last 168 hours.  No results found for this or any previous visit (from the past 240 hour(s)).       Radiology Studies: DG Abd 1 View  Result Date: 01/20/2021 CLINICAL DATA:  Abdominal pain. EXAM: ABDOMEN - 1  VIEW COMPARISON:  CT of the abdomen on 12/04/2020, esophagram on 01/14/2021 FINDINGS: There is some residual barium in the stomach from recent esophagram. There is some gaseous distension of the colon in the region of the transverse colon and there may be a component of colonic ileus. No small bowel dilatation or signs of free air. No abnormal calcifications. Clips related to prior cholecystectomy. Degenerative disc disease of the lumbar spine. IMPRESSION: Potential colonic  ileus. Residual barium in the stomach related to recent esophagram 6 days ago. This may implicate some degree poor gastric motility/emptying. Electronically Signed   By: Irish Lack M.D.   On: 01/20/2021 12:26        Scheduled Meds:  metoprolol tartrate  2.5 mg Intravenous Q6H   pantoprazole  40 mg Oral BID   scopolamine  1 patch Transdermal Q72H     LOS: 8 days    Time spent: 35 minutes    Eren Ryser Hoover Brunette, DO [facility]  If 7PM-7AM, please contact night-coverage [email or web address] 01/21/2021, 2:40 PM

## 2021-01-21 NOTE — Progress Notes (Addendum)
Initial Nutrition Assessment  DOCUMENTATION CODES:   Non-severe (moderate) malnutrition in context of chronic illness  INTERVENTION:  Recommend consider starting IV- D5% while pt is waiting for PEG placement due to her limited tolerance of oral liquids.   If patient has PEG placed recommend: Initiate Osmolite 1.2 @ 20 ml/hr via PEG and increase by 10 ml every 8 hours to goal rate of 55 ml/hr.   Tube feeding regimen provides 1584 kcal, 73.2 grams of protein, and 1082 ml of H2O.    NUTRITION DIAGNOSIS:   Moderate Malnutrition related to chronic illness (achalasia) as evidenced by energy intake < or equal to 75% for > or equal to 1 month, mild fat depletion, mild muscle depletion.   GOAL:  Patient will meet greater than or equal to 90% of their needs   MONITOR:  Diet advancement, Supplement acceptance, PO intake, Weight trends  REASON FOR ASSESSMENT:   Malnutrition Screening Tool    ASSESSMENT: patient is a 80 yo female with history of DM-2, HTN, GERD and chronic achalasia. Patient presents with epigastric pain, weakness and dehydration.   Positive ANA and SSA antibody (to follow up with Rheumatology).  Patient is currently NPO. Poor tolerance of oral intake per chart review. No family at bedside during RD visit. Expect pt meeting < 75% of energy needs the past month based on chart review. RD assessed pt on 7/1 and she was eating very little at that time. Question if pt a candidate for tube feeding if chronically unable to meet nutrition needs orally.   Weight loss reported per chart review. Patient weight 10/24/20 -56.7 kg (124.7 lb) and currently 54.4 kg (119.6 lb).    7/20 SLP swallow evaluation- dysphagia NPO   7/21 EGD w/botox. Stomach filled with fluid, possible gastroparesis. Palliative GOC-pt doesn't want a feeding tube  7/22 Per GI- consider Heller myotomy or POEM. Patient suctions excess fluids and is open to further discuss tube feeding. Diet advanced to clear  liquids.   7/26 Per GI abdominal x-ray question for ileus. General surgery consulted possible G-tube placement Friday.   7/27 Discussed patient during rounds. Clear liquid diet and protein with intake 0-50% the past 3 days. There are at least 6-8 containers of fluids staked on pt table besides the untouched liquids on her tray. Talked with patient and daughter who was bedside trying to encourage pt to take fluids. No intake at lunch and daughter says pt is afraid she will vomit. Per daughter they have decided to pursue PEG placement and plans are for Friday. Recommendations for enteral feeding above. Patient is high refeeding risk due to her prolong poor intake.   Medications reviewed and include:  protonix.  Labs: BMP Latest Ref Rng & Units 01/20/2021 01/20/2021 01/18/2021  Glucose 70 - 99 mg/dL 71 - 72  BUN 8 - 23 mg/dL 14 - 10  Creatinine 9.32 - 1.00 mg/dL 6.71 2.45 8.09(X)  Sodium 135 - 145 mmol/L 137 - 137  Potassium 3.5 - 5.1 mmol/L 3.8 - 3.8  Chloride 98 - 111 mmol/L 103 - 105  CO2 22 - 32 mmol/L 20(L) - 26  Calcium 8.9 - 10.3 mg/dL 8.9 - 8.7(L)      NUTRITION - FOCUSED PHYSICAL EXAM: Nutrition-Focused physical exam completed. Findings are mild upper arm fat depletion, moderate clavicle and mild temporal muscle depletion, and no edema.     Diet Order:   Diet Order             Diet clear liquid Room  service appropriate? Yes; Fluid consistency: Thin  Diet effective now                   EDUCATION NEEDS:  Not appropriate for education at this time  Skin:  Skin Assessment: Reviewed RN Assessment  Last BM:  Prior to admission???   Height:   Ht Readings from Last 1 Encounters:  01/13/21 5\' 3"  (1.6 m)    Weight:   Wt Readings from Last 1 Encounters:  01/13/21 54.4 kg    Ideal Body Weight:   52 kg  BMI:  Body mass index is 21.26 kg/m.  Estimated Nutritional Needs:   Kcal:  1600-1795  Protein:  75-80 gr  Fluid:  1.6-1.7 liters daily  01/15/21  MS,RD,CSG,LDN Contact: Royann Shivers

## 2021-01-22 ENCOUNTER — Inpatient Hospital Stay (HOSPITAL_COMMUNITY): Payer: Medicare Other | Admitting: Certified Registered Nurse Anesthetist

## 2021-01-22 ENCOUNTER — Encounter (HOSPITAL_COMMUNITY)
Admission: EM | Disposition: A | Discharge status: Skilled Nursing Facility | Payer: Self-pay | Source: Home / Self Care | Attending: Internal Medicine

## 2021-01-22 ENCOUNTER — Encounter (HOSPITAL_COMMUNITY): Payer: Self-pay | Admitting: Internal Medicine

## 2021-01-22 DIAGNOSIS — K220 Achalasia of cardia: Secondary | ICD-10-CM | POA: Diagnosis not present

## 2021-01-22 DIAGNOSIS — R1010 Upper abdominal pain, unspecified: Secondary | ICD-10-CM | POA: Diagnosis not present

## 2021-01-22 DIAGNOSIS — E8809 Other disorders of plasma-protein metabolism, not elsewhere classified: Secondary | ICD-10-CM | POA: Diagnosis not present

## 2021-01-22 DIAGNOSIS — E46 Unspecified protein-calorie malnutrition: Secondary | ICD-10-CM | POA: Diagnosis not present

## 2021-01-22 DIAGNOSIS — R1310 Dysphagia, unspecified: Secondary | ICD-10-CM | POA: Diagnosis not present

## 2021-01-22 HISTORY — PX: BOWEL RESECTION: SHX1257

## 2021-01-22 HISTORY — PX: GASTROSTOMY: SHX5249

## 2021-01-22 HISTORY — PX: LYSIS OF ADHESION: SHX5961

## 2021-01-22 LAB — SURGICAL PCR SCREEN
MRSA, PCR: POSITIVE — AB
Staphylococcus aureus: POSITIVE — AB

## 2021-01-22 LAB — GLUCOSE, CAPILLARY: Glucose-Capillary: 110 mg/dL — ABNORMAL HIGH (ref 70–99)

## 2021-01-22 SURGERY — INSERTION OF GASTROSTOMY TUBE
Anesthesia: General | Site: Abdomen

## 2021-01-22 MED ORDER — SEVOFLURANE IN SOLN
RESPIRATORY_TRACT | Status: AC
  Filled 2021-01-22: qty 250

## 2021-01-22 MED ORDER — LACTATED RINGERS IV SOLN: INTRAVENOUS | Status: DC | PRN

## 2021-01-22 MED ORDER — BACITRACIN-NEOMYCIN-POLYMYXIN 400-5-5000 EX OINT
1.0000 "application " | TOPICAL_OINTMENT | Freq: Every day | CUTANEOUS | Status: DC
  Administered 2021-01-22: 1 via TOPICAL
  Filled 2021-01-22 (×2): qty 1

## 2021-01-22 MED ORDER — METOPROLOL TARTRATE 5 MG/5ML IV SOLN
INTRAVENOUS | Status: AC
  Filled 2021-01-22: qty 5

## 2021-01-22 MED ORDER — STERILE WATER FOR IRRIGATION IR SOLN
Status: DC | PRN
  Administered 2021-01-22: 500 mL

## 2021-01-22 MED ORDER — SUGAMMADEX SODIUM 200 MG/2ML IV SOLN
INTRAVENOUS | Status: DC | PRN
  Administered 2021-01-22: 50 mg via INTRAVENOUS

## 2021-01-22 MED ORDER — SODIUM CHLORIDE 0.9 % IR SOLN
Status: DC | PRN
  Administered 2021-01-22: 1000 mL

## 2021-01-22 MED ORDER — PHENYLEPHRINE HCL (PRESSORS) 10 MG/ML IV SOLN
INTRAVENOUS | Status: DC | PRN
  Administered 2021-01-22: 80 ug via INTRAVENOUS
  Administered 2021-01-22: 120 ug via INTRAVENOUS
  Administered 2021-01-22: 160 ug via INTRAVENOUS
  Administered 2021-01-22: 120 ug via INTRAVENOUS
  Administered 2021-01-22: 80 ug via INTRAVENOUS

## 2021-01-22 MED ORDER — ROCURONIUM BROMIDE 10 MG/ML (PF) SYRINGE
PREFILLED_SYRINGE | INTRAVENOUS | Status: DC | PRN
  Administered 2021-01-22: 50 mg via INTRAVENOUS

## 2021-01-22 MED ORDER — ESMOLOL HCL 100 MG/10ML IV SOLN
INTRAVENOUS | Status: AC
  Filled 2021-01-22: qty 10

## 2021-01-22 MED ORDER — POVIDONE-IODINE 10 % EX OINT
TOPICAL_OINTMENT | CUTANEOUS | Status: AC
  Filled 2021-01-22: qty 2

## 2021-01-22 MED ORDER — MUPIROCIN 2 % EX OINT
1.0000 "application " | TOPICAL_OINTMENT | Freq: Two times a day (BID) | CUTANEOUS | Status: AC
  Administered 2021-01-22 – 2021-01-26 (×9): 1 via NASAL
  Filled 2021-01-22 (×2): qty 22

## 2021-01-22 MED ORDER — PHENYLEPHRINE HCL-NACL 10-0.9 MG/250ML-% IV SOLN
INTRAVENOUS | Status: DC | PRN
  Administered 2021-01-22: 20 ug/min via INTRAVENOUS

## 2021-01-22 MED ORDER — PROPOFOL 10 MG/ML IV BOLUS
INTRAVENOUS | Status: AC
  Filled 2021-01-22: qty 40

## 2021-01-22 MED ORDER — FENTANYL CITRATE (PF) 100 MCG/2ML IJ SOLN
INTRAMUSCULAR | Status: AC
  Filled 2021-01-22: qty 2

## 2021-01-22 MED ORDER — PHENYLEPHRINE HCL (PRESSORS) 10 MG/ML IV SOLN
INTRAVENOUS | Status: AC
  Filled 2021-01-22: qty 1

## 2021-01-22 MED ORDER — LIDOCAINE HCL (CARDIAC) PF 100 MG/5ML IV SOSY
PREFILLED_SYRINGE | INTRAVENOUS | Status: DC | PRN
  Administered 2021-01-22: 40 mg via INTRAVENOUS

## 2021-01-22 MED ORDER — ONDANSETRON HCL 4 MG/2ML IJ SOLN
INTRAMUSCULAR | Status: DC | PRN
  Administered 2021-01-22: 4 mg via INTRAVENOUS

## 2021-01-22 MED ORDER — ESMOLOL HCL 100 MG/10ML IV SOLN
INTRAVENOUS | Status: DC | PRN
  Administered 2021-01-22 (×3): 5 mg via INTRAVENOUS

## 2021-01-22 MED ORDER — DEXAMETHASONE SODIUM PHOSPHATE 10 MG/ML IJ SOLN
INTRAMUSCULAR | Status: DC | PRN
  Administered 2021-01-22: 5 mg via INTRAVENOUS

## 2021-01-22 MED ORDER — FENTANYL CITRATE (PF) 100 MCG/2ML IJ SOLN
INTRAMUSCULAR | Status: DC | PRN
  Administered 2021-01-22: 50 ug via INTRAVENOUS
  Administered 2021-01-22 (×2): 25 ug via INTRAVENOUS

## 2021-01-22 MED ORDER — PROPOFOL 10 MG/ML IV BOLUS
INTRAVENOUS | Status: DC | PRN
  Administered 2021-01-22: 50 mg via INTRAVENOUS

## 2021-01-22 MED ORDER — CHLORHEXIDINE GLUCONATE CLOTH 2 % EX PADS
6.0000 | MEDICATED_PAD | Freq: Every day | CUTANEOUS | Status: AC
  Administered 2021-01-23 – 2021-01-27 (×5): 6 via TOPICAL

## 2021-01-22 SURGICAL SUPPLY — 45 items
BAG URINE DRAIN 2000ML AR STRL (UROLOGICAL SUPPLIES) ×1 IMPLANT
BINDER ABDOMINAL  9 SM 30-45 (SOFTGOODS) ×3
BINDER ABDOMINAL 9 SM 30-45 (SOFTGOODS) IMPLANT
CHLORAPREP W/TINT 26 (MISCELLANEOUS) ×3 IMPLANT
CLOTH BEACON ORANGE TIMEOUT ST (SAFETY) ×3 IMPLANT
CNTNR URN SCR LID CUP LEK RST (MISCELLANEOUS) IMPLANT
CONT SPEC 4OZ STRL OR WHT (MISCELLANEOUS) ×3
COVER LIGHT HANDLE STERIS (MISCELLANEOUS) ×6 IMPLANT
DRSG OPSITE POSTOP 4X8 (GAUZE/BANDAGES/DRESSINGS) ×1 IMPLANT
ELECT REM PT RETURN 9FT ADLT (ELECTROSURGICAL) ×3
ELECTRODE REM PT RTRN 9FT ADLT (ELECTROSURGICAL) ×2 IMPLANT
GAUZE SPONGE 4X4 12PLY STRL (GAUZE/BANDAGES/DRESSINGS) ×3 IMPLANT
GLOVE SURG ENC MOIS LTX SZ6.5 (GLOVE) ×3 IMPLANT
GLOVE SURG POLYISO LF SZ6.5 (GLOVE) ×2 IMPLANT
GLOVE SURG POLYISO LF SZ7 (GLOVE) ×1 IMPLANT
GLOVE SURG UNDER POLY LF SZ7 (GLOVE) ×6 IMPLANT
GOWN STRL REUS W/TWL LRG LVL3 (GOWN DISPOSABLE) ×6 IMPLANT
INST SET MAJOR GENERAL (KITS) ×3 IMPLANT
KIT TURNOVER KIT A (KITS) ×3 IMPLANT
LIGASURE IMPACT 36 18CM CVD LR (INSTRUMENTS) ×1 IMPLANT
MANIFOLD NEPTUNE II (INSTRUMENTS) ×3 IMPLANT
NS IRRIG 1000ML POUR BTL (IV SOLUTION) ×3 IMPLANT
PACK MINOR (CUSTOM PROCEDURE TRAY) ×3 IMPLANT
PAD ARMBOARD 7.5X6 YLW CONV (MISCELLANEOUS) ×3 IMPLANT
RELOAD PROXIMATE 75MM BLUE (ENDOMECHANICALS) ×6 IMPLANT
RELOAD STAPLE 75 3.8 BLU REG (ENDOMECHANICALS) IMPLANT
RETRACTOR WOUND ALXS 18CM MED (MISCELLANEOUS) IMPLANT
RTRCTR WOUND ALEXIS O 18CM MED (MISCELLANEOUS) ×3
SET BASIN LINEN APH (SET/KITS/TRAYS/PACK) ×3 IMPLANT
SPONGE T-LAP 18X18 ~~LOC~~+RFID (SPONGE) ×3 IMPLANT
STAPLER GUN LINEAR PROX 60 (STAPLE) ×1 IMPLANT
STAPLER PROXIMATE 75MM BLUE (STAPLE) ×1 IMPLANT
STAPLER VISISTAT (STAPLE) ×1 IMPLANT
SUT CHROMIC 3 0 SH 27 (SUTURE) ×3 IMPLANT
SUT ETHILON 3 0 FSL (SUTURE) ×1 IMPLANT
SUT PDS AB CT VIOLET #0 27IN (SUTURE) ×6 IMPLANT
SUT SILK 3 0 SH CR/8 (SUTURE) ×5 IMPLANT
SUT VIC AB 2-0 CT1 27 (SUTURE) ×9
SUT VIC AB 2-0 CT1 TAPERPNT 27 (SUTURE) IMPLANT
SUT VIC AB 3-0 SH 27 (SUTURE) ×3
SUT VIC AB 3-0 SH 27X BRD (SUTURE) IMPLANT
SYR 10ML LL (SYRINGE) ×1 IMPLANT
SYR TOOMEY IRRIG 70ML (MISCELLANEOUS) ×3
SYRINGE TOOMEY IRRIG 70ML (MISCELLANEOUS) IMPLANT
WATER STERILE IRR 500ML POUR (IV SOLUTION) ×1 IMPLANT

## 2021-01-22 NOTE — Anesthesia Preprocedure Evaluation (Signed)
Anesthesia Evaluation  Patient identified by MRN, date of birth, ID band Patient awake    Reviewed: Allergy & Precautions, NPO status , Patient's Chart, lab work & pertinent test results, reviewed documented beta blocker date and time   History of Anesthesia Complications Negative for: history of anesthetic complications  Airway Mallampati: III  TM Distance: >3 FB Neck ROM: Full    Dental  (+) Dental Advisory Given, Edentulous Upper, Missing   Pulmonary neg pulmonary ROS,    Pulmonary exam normal breath sounds clear to auscultation       Cardiovascular Exercise Tolerance: Good hypertension, Pt. on home beta blockers and Pt. on medications  Rhythm:Regular Rate:Tachycardia     Neuro/Psych  Neuromuscular disease    GI/Hepatic GERD  Medicated and Poorly Controlled,  Endo/Other  diabetes, Well Controlled, Type 2  Renal/GU      Musculoskeletal   Abdominal   Peds  Hematology  (+) anemia ,   Anesthesia Other Findings Hypokalemia  Prolonged QT  Reproductive/Obstetrics                             Anesthesia Physical  Anesthesia Plan  ASA: 3  Anesthesia Plan: General   Post-op Pain Management:    Induction:   PONV Risk Score and Plan: Ondansetron  Airway Management Planned: Oral ETT  Additional Equipment:   Intra-op Plan:   Post-operative Plan:   Informed Consent: I have reviewed the patients History and Physical, chart, labs and discussed the procedure including the risks, benefits and alternatives for the proposed anesthesia with the patient or authorized representative who has indicated his/her understanding and acceptance.     Dental advisory given  Plan Discussed with: CRNA  Anesthesia Plan Comments:         Anesthesia Quick Evaluation

## 2021-01-22 NOTE — Plan of Care (Signed)
  Problem: Education: Goal: Knowledge of General Education information will improve Description: Including pain rating scale, medication(s)/side effects and non-pharmacologic comfort measures Outcome: Not Progressing   Problem: Health Behavior/Discharge Planning: Goal: Ability to manage health-related needs will improve Outcome: Progressing   Problem: Clinical Measurements: Goal: Ability to maintain clinical measurements within normal limits will improve Outcome: Progressing Goal: Will remain free from infection Outcome: Progressing Goal: Diagnostic test results will improve Outcome: Progressing   Problem: Activity: Goal: Risk for activity intolerance will decrease Outcome: Progressing   Problem: Nutrition: Goal: Adequate nutrition will be maintained Outcome: Not Progressing   Problem: Pain Managment: Goal: General experience of comfort will improve Outcome: Progressing   Problem: Safety: Goal: Ability to remain free from injury will improve Outcome: Progressing   Problem: Skin Integrity: Goal: Risk for impaired skin integrity will decrease Outcome: Progressing

## 2021-01-22 NOTE — Interval H&P Note (Signed)
History and Physical Interval Note:  01/22/2021 8:36 AM  Kathryn [name]  has presented today for surgery, with the diagnosis of malnutrition, esophageal dysmolitity.  The various methods of treatment have been discussed with the patient and family. After consideration of risks, benefits and other options for treatment, the patient has consented to  Procedure(s): INSERTION OF GASTROSTOMY TUBE (N/A) as a surgical intervention.  The patient's history has been reviewed, patient examined, no change in status, stable for surgery.  I have reviewed the patient's chart and labs.  Questions were answered to the patient's satisfaction.     Lucretia Roers

## 2021-01-22 NOTE — Progress Notes (Signed)
[facility]  Gastrostomy tube placed open and 24 french large tube is to gravity bag until 1800 today. Can use for meds at 1800. NPO for now and no feeds through the tube now. Will likely start feeds tomorrow slowly.   Had to get a small bowel resection due to adhesions to the abdominal wall.   Updated daughter Kathryn [name] over the phone.  Binder PRN IV pain meds NPO Meds per g tube at 1800, G tube to gravity until then and then can clamp after meds   Kathryn Greenhouse, MD [facility] [address] Vella Raring [place], [place] [address] [phone] (office)

## 2021-01-22 NOTE — Transfer of Care (Signed)
Immediate Anesthesia Transfer of Care Note  Patient: Kathryn [name]  Procedure(s) Performed: INSERTION OF GASTROSTOMY TUBE (Abdomen) SMALL BOWEL RESECTION (Abdomen)  Patient Location: PACU  Anesthesia Type:General  Level of Consciousness: awake, alert  and patient cooperative  Airway & Oxygen Therapy: Patient Spontanous Breathing and Patient connected to face mask oxygen  Post-op Assessment: Report given to RN  Post vital signs: Reviewed and stable  Last Vitals:  Vitals Value Taken Time  BP 134/70 01/22/21 1053  Temp    Pulse 97 01/22/21 1056  Resp 29 01/22/21 1056  SpO2 100 % 01/22/21 1056  Vitals shown include unvalidated device data.  Last Pain:  Vitals:   01/22/21 0813  TempSrc: Oral  PainSc: 0-No pain      Patients Stated Pain Goal: 6 (01/22/21 0813)  Complications: No notable events documented.

## 2021-01-22 NOTE — Progress Notes (Signed)
PROGRESS NOTE    Kathryn [name]  KRC:[id number] DOB: 1941-04-03 DOA: 01/13/2021 PCP: Suzan Slick, MD   Brief Narrative:   Kathryn [name] is a 80 y.o. female with medical history significant for  hypertension, type 2 diabetes mellitus, GERD, positive ANA and SSA autoantibodies, and chronic achalasia (2014 manometry showed abnormal motility)  who presents to the emergency department due to more than 2 months history of decreased oral intake, generalized weakness and about 3 to 4-week onset of abdominal pain associated with difficulty in being able to swallow (solid and liquid).  She believes that she must have lost up to 30 pounds since onset of symptoms.  Patient endorsed that she usually regurgitates any food/liquid ingested within 15 to 20 minutes of ingestion.  Abdominal pain was in the epigastric area and was sharp in nature, this worsens with food.  She denies fever, chills, chest pain, shortness of breath.   She was admitted to the hospital from 12/25/2020-12/28/2020, at that time barium p.o. esophagram done urgently as an outpatient was indicated.  She was also admitted from 10/23/2020 to 10/25/2020 during which patient presented with proximal muscle weakness with an elevated CPK of 1206 that was concerning for polymyalgia rheumatica. This was treated with prednisone with improvement.  EGD done on 10/04/2020 showed normal dilated esophagus and small hiatal hernia.   In the emergency department, she was intermittently tachypneic and tachycardic.  Other vital signs were within normal range.  Work-up in the ED showed normocytic anemia and leukocytosis, BMP was normal except for elevated BUN at and hyperglycemia with CBG of 135.  Albumin 3.2. CT of head without contrast showed no acute intracranial abnormality. IV hydration was provided, hospitalist was asked to admit patient for further evaluation and management.   7/20 - Patient still experiences significant epigastric pain. Using suction for  oral secretion. No other acute complaints. GI (Dr. Karilyn Cota) was consulted and will provide further recommendations. Of note, patient has not been able to follow up with outpatient rheumatology and has not been receiving treatments    7/21 - Patient is clinically stable. EGD with botox treatment was successfully completed.   7/22 - Patient continues to have difficulty swallowing.  Began discussion with patient regarding possible need for temporary PEG placement if needed.   7/24-7/25 - pt making trials of liquids but only tolerating small amounts of liquids.     7/26 -7/27- recurrent abdominal pain, unable to take any liquids today.  Still suctioning frequently.  Agreeable to PEG consultation and placement. Will start IVF since intake is minimal.  7/28-patient has undergone open gastrostomy tube placement and has tube to gravity with plans to keep n.p.o. until 7/29.  Assessment & Plan:   Principal Problem:   Abdominal pain Active Problems:   HTN (hypertension)   Dysphagia   Nausea and vomiting   Generalized weakness   Dehydration   Normocytic anemia   Leukocytosis   Hyperglycemia   Hypoalbuminemia due to protein-calorie malnutrition (HCC)   Prolonged QT interval   Achalasia   Malnutrition of moderate degree   Hypoactive bowel sounds   Epigastric/Abdominal pain, nausea and vomiting in the setting of esophageal dysphagia Patient is ANA/SSA antibody positive. Sjogren's syndrome and SLE are on the differential. Patient also has had increased oral secretion, suggestive of sicca-related symptoms. Gastrointestinal involvement of SLE can manifest as esophageal motility disorder, such as achalasia, which has been suggesting in this patient. Other GI manifestations of SLE include acute pancreatitis, for which  patient signs  and symptoms are concerning, but CT 6/30 has shown no acute pancreatic findings. EGD in April 2022 showed unremarkable esophagus, and patient underwent dialtion given history  of dysphagia, but patient states that did not help with symptoms.   -Continue IV morphine 0.5 mg q.4h p.r.n. for severe pain -Continue Protonix 40 mg po BID -Continue clear liquid diet with plan to advanced diet as tolerated -Gastroenterology following for further recommendations -ongoing goals of care discussions with palliative team, patient and daughter regarding tube feeding -Patient has undergone G-tube placement with plans to initiate trickle feeds by 7/29 -Currently will remain n.p.o.   Generalized weakness and dehydration in the setting of above -Hypoalbuminemia secondary to mild protein calorie malnutrition -Continue IV hydration as described above -Continue protein supplement - surgery consult for PEG placement likely 7/29   Urinary tract infection - TREATED - UA in the ED showed many bacteria, moderate ketones, nitrites -Patient is on ceftriaxone 1g for coverage (completed on 7/23)  Normocytic anemia -Chronic and Stable  Leukocytosis/hyperglycemia possibly reactive - WBC normalized - Continue to monitor WBC and blood glucose with morning labs   Prolonged QTc (RESOLVED) - Avoid QT prolonging drugs - Magnesium 1.9 - Repeated EKG with resolution of prolonged QTc   COVID-19 infection (incidental finding) -Patient is asymptomatic and stable on room air -This was already diagnosed during last admission   Essential hypertension -Continue Lopressor but since can't take tabs now, we changed it to IV lopressor 2.5 mg Q6h until she can take the tablets again  Hyperglycemia secondary to T2DM -Hemoglobin A1c on 6/30-5.8%   GERD -Continue Protonix     DVT prophylaxis:SCD Code Status: Full Family Communication: Daughter at bedside 7/28 Disposition Plan:  Status is: Inpatient   Remains inpatient appropriate because:Ongoing diagnostic testing needed not appropriate for outpatient work up and IV treatments appropriate due to intensity of illness or inability to take PO    Dispo: The patient is from: Home              Anticipated d/c is to: SNF              Patient currently is not medically stable to d/c.              Difficult to place patient No   Consultants:  Gastroenterology Palliative care Surgery (regarding PEG placement)   Procedures:  Barium swallow 7/20 EGD with botox injection 7/21 Open gastrostomy tube placement with lysis of adhesions and small bowel resection 7/28  Antimicrobials:  Anti-infectives (From admission, onward)    Start     Dose/Rate Route Frequency Ordered Stop   01/22/21 0600  ceFAZolin (ANCEF) IVPB 2g/100 mL premix        2 g 200 mL/hr over 30 Minutes Intravenous On call to O.R. 01/21/21 1747 01/22/21 0900   01/14/21 0830  cefTRIAXone (ROCEPHIN) 1 g in sodium chloride 0.9 % 100 mL IVPB        1 g 200 mL/hr over 30 Minutes Intravenous Every 24 hours 01/14/21 0735 01/16/21 1023   01/14/21 0800  fosfomycin (MONUROL) packet 3 g  Status:  Discontinued        3 g Oral  Once 01/14/21 0711 01/14/21 0734       Subjective: Patient seen and evaluated today with no new acute complaints or concerns. No acute concerns or events noted overnight.  She is somewhat somnolent after G-tube placement procedure today.  Objective: Vitals:   01/22/21 1100 01/22/21 1115 01/22/21 1130 01/22/21 1218  BP: Marland Kitchen)  142/72 126/72 119/67 (!) 117/57  Pulse: 98 (!) 103 (!) 104 (!) 107  Resp: (!) 28 (!) 25 20 18   Temp:    (!) 97.5 F (36.4 C)  TempSrc:    Oral  SpO2: 100% 97% 95% 99%  Weight:      Height:        Intake/Output Summary (Last 24 hours) at 01/22/2021 1324 Last data filed at 01/22/2021 1100 Gross per 24 hour  Intake 2416.59 ml  Output 2410 ml  Net 6.59 ml   Filed Weights   01/13/21 1909  Weight: 54.4 kg    Examination:  General exam: Appears calm and comfortable, mostly somnolent, but arousable Respiratory system: Clear to auscultation. Respiratory effort normal. Cardiovascular system: S1 & S2 heard, RRR.   Gastrointestinal system: G-tube present with abdominal binder. Central nervous system: Alert and awake Extremities: No edema Skin: No significant lesions noted Psychiatry: Flat affect.    Data Reviewed: I have personally reviewed following labs and imaging studies  CBC: Recent Labs  Lab 01/16/21 0445 01/17/21 0548 01/18/21 0554 01/20/21 0413  WBC 9.2 8.5 9.2 10.2  NEUTROABS  --   --   --  8.9*  HGB 9.9* 10.7* 10.5* 11.5*  HCT 31.3* 32.2* 32.2* 36.3  MCV 100.3* 99.4 98.8 101.4*  PLT 148* 143* 147* 176   Basic Metabolic Panel: Recent Labs  Lab 01/16/21 0445 01/17/21 0548 01/18/21 0554 01/20/21 0413  NA 143 138 137 137  K 3.4* 3.3* 3.8 3.8  CL 110 104 105 103  CO2 28 29 26  20*  GLUCOSE 125* 91 72 71  BUN 14 10 10 14   CREATININE 0.43* 0.46 0.42* 0.59  0.63  CALCIUM 9.0 8.6* 8.7* 8.9  MG 1.9  --  1.7  --   PHOS 1.6*  --  2.5  --    GFR: Estimated Creatinine Clearance: 45.6 mL/min (by C-G formula based on SCr of 0.63 mg/dL). Liver Function Tests: Recent Labs  Lab 01/20/21 0413  AST 27  ALT 15  ALKPHOS 65  BILITOT 1.1  PROT 7.4  ALBUMIN 2.9*   Recent Labs  Lab 01/20/21 0413  LIPASE 118*   No results for input(s): AMMONIA in the last 168 hours. Coagulation Profile: No results for input(s): INR, PROTIME in the last 168 hours. Cardiac Enzymes: No results for input(s): CKTOTAL, CKMB, CKMBINDEX, TROPONINI in the last 168 hours. BNP (last 3 results) No results for input(s): PROBNP in the last 8760 hours. HbA1C: No results for input(s): HGBA1C in the last 72 hours. CBG: Recent Labs  Lab 01/15/21 1334 01/15/21 1507 01/22/21 1100  GLUCAP 112* 95 110*   Lipid Profile: No results for input(s): CHOL, HDL, LDLCALC, TRIG, CHOLHDL, LDLDIRECT in the last 72 hours. Thyroid Function Tests: No results for input(s): TSH, T4TOTAL, FREET4, T3FREE, THYROIDAB in the last 72 hours. Anemia Panel: No results for input(s): VITAMINB12, FOLATE, FERRITIN, TIBC, IRON,  RETICCTPCT in the last 72 hours. Sepsis Labs: No results for input(s): PROCALCITON, LATICACIDVEN in the last 168 hours.  Recent Results (from the past 240 hour(s))  Surgical pcr screen     Status: Abnormal   Collection Time: 01/22/21  8:16 AM   Specimen: Nasal Mucosa; Nasal Swab  Result Value Ref Range Status   MRSA, PCR POSITIVE (A) NEGATIVE Final    Comment: RESULT CALLED TO, READ BACK BY AND VERIFIED WITH: EMILY WRIGHT,RN @1152  01/22/2021 KAY    Staphylococcus aureus POSITIVE (A) NEGATIVE Final    Comment: RESULT CALLED TO, READ BACK  BY AND VERIFIED WITH: EMILY WRIGHT,RN @1152  01/22/2021 KAY (NOTE) The Xpert SA Assay (FDA approved for NASAL specimens in patients 82 years of age and older), is one component of a comprehensive surveillance program. It is not intended to diagnose infection nor to guide or monitor treatment. Performed at [facility], [address] [place], [place] [address]          Radiology Studies: No results found.      Scheduled Meds:  [START ON 01/23/2021] Chlorhexidine Gluconate Cloth  6 each Topical Q0600   metoprolol tartrate  2.5 mg Intravenous Q6H   mupirocin ointment  1 application Nasal BID   neomycin-bacitracin-polymyxin  1 application Topical Daily   pantoprazole  40 mg Oral BID   scopolamine  1 patch Transdermal Q72H   Continuous Infusions:  lactated ringers 75 mL/hr at 01/22/21 1225     LOS: 9 days    Time spent: 35 minutes    Dashanique Brownstein Hoover Brunette Alexi Geibel, DO [facility]  If 7PM-7AM, please contact night-coverage [email or web address] 01/22/2021, 1:24 PM

## 2021-01-22 NOTE — Progress Notes (Signed)
Okay to give patient ice chips per MD.

## 2021-01-22 NOTE — Anesthesia Postprocedure Evaluation (Signed)
Anesthesia Post Note  Patient: Kathryn [name]  Procedure(s) Performed: INSERTION OF GASTROSTOMY TUBE (Abdomen) SMALL BOWEL RESECTION (Abdomen)  Patient location during evaluation: Phase II Anesthesia Type: General Level of consciousness: awake Pain management: pain level controlled Vital Signs Assessment: post-procedure vital signs reviewed and stable Respiratory status: spontaneous breathing and respiratory function stable Cardiovascular status: blood pressure returned to baseline and stable Postop Assessment: no headache and no apparent nausea or vomiting Anesthetic complications: no Comments: Late entry   No notable events documented.   Last Vitals:  Vitals:   01/22/21 1130 01/22/21 1218  BP: 119/67 (!) 117/57  Pulse: (!) 104 (!) 107  Resp: 20 18  Temp:  (!) 36.4 C  SpO2: 95% 99%    Last Pain:  Vitals:   01/22/21 1218  TempSrc: Oral  PainSc:                  Windell Norfolk

## 2021-01-22 NOTE — Progress Notes (Signed)
SLP Cancellation Note  Patient Details Name: Kathryn [name] MRN: [id number] DOB: 1941/01/17   Cancelled treatment:        Pt currently off floor for possible G-tube placement due to ongoing dysphagia in setting of possible SLE vs ?. SLP will follow more peripherally due to poor po tolerance and continued nausea. Please contact SLP if acute needs arise. Will keep her on our caseload for now.   Thank you,  Havery Moros, CCC-SLP [phone]                                                                                                 Oden Lindaman 01/22/2021, 8:58 AM

## 2021-01-22 NOTE — Op Note (Signed)
[facility] Operative Note  01/22/21  Preoperative Diagnosis:  Protein Malnutrition, Weight Loss, Achalasia, Dysphagia   Postoperative Diagnosis:  Protein Malnutrition, Weight Loss, Achalasia, Dysphagia, adhesions with small bowel enterotomy    Procedure(s) Performed: Open Gastrostomy tube placement (24 french), lysis of adhesions small bowel resection and anastomosis    Surgeon: Leatrice Jewels. Henreitta Leber, MD   Assistants: No qualified resident was available    Anesthesia: General endotracheal   Anesthesiologist: Windell Norfolk, MD    Specimens: None    Estimated Blood Loss: Minimal   Blood Replacement: None    Complications: None   Wound Class: Clean contaminated    Operative Indications: Ms. Kathryn [name] is an 80 yo with presumed achalasia diagnosed in 2014, worsening dysphagia and weight loss with malnutrition. She also may be developing gastroparesis and dysmotility of the remainder of her bowel related to an undiagnosed rheumatologic disease.  I have spoken with the patient and daughter regarding and open G tube that could be placed on distal stomach as to not compromise any potential for future achalasia procedures, and trial of decompression and feeds with the option of doing a IR guided GJ through the G tube to allow for more distal feeds if she does not tolerate the G tube feeds.  We also discussed eventually TPN if she has dysmotility of the entire bowel. We discussed risk of bleeding, infection, injury to bowel, need for additional procedures like the GJ placement, and failure of this to work for enteral nutrition.   Findings: Adhesion of the small bowel to the midline with adherent small bowel, enterotomy made and resected    Procedure: The patient was taken to the operating room and placed supine. General endotracheal anesthesia was induced. Intravenous antibiotics were administered per protocol.  An orogastric tube positioned to decompress the stomach. 1800 cc of  contents were removed.  The abdomen was prepared and draped in the usual sterile fashion.   Her prior upper midline which I presume was for her cholecystectomy was used and carried down to the fascia. The fascia was entered with care with scissors. There were extensive small bowel adhesions to the anterior abdominal wall with permanent sutures remaining in the fascia. With care I dissected the small bowel off the anterior abdominal wall and noted an enterotomy given the very thin bowel and adherence to the anterior wall.  The adhesions were taken down. A wound protector was placed. The small bowel was ran and interloop adhesion were taken down. The enterotomy was about 80 cm from the ligament of treitz. I still did not think distal feeding tube was the best option given issues with jejunostomy tube issues and complications and the concern for her dysmotility.  The small bowel enterotomy was resected with two linear cutting 75 mm staplers. The mesentery was taken with a ligasure. A standard antimesenteric side to side anastomosis was performed with a linear cutting 75 mm stapler and the common enterotomy was closed with a TA 60. The staple line was oversewn with 3-0 Silk Lembert, and two crotch sutures were placed with 3-0 Silk.  The mesentery was closed with 2-0 chromic gut suture. The small bowel was ran and was decompressed with no signs of any distal points of obstruction. The small bowel was placed back into the abdomen in anatomic position.   Attention was turned to the stomach and the greater curve on the distal stomach was grasped with a babcock. and easily elevated to the right upper quadrant just right of  midline. An incision was made through the skin and a kelly was used to go through the muscle wall, and the 24 french gastrostomy tube was placed through the abdominal wall. A pursestring of 3-0 Vicryl was completed and the gastrotomy was made and the gastrostomy tube was placed into the hole. The  pursestring was tied down. A second pursestring was placed with 3-0 Vicryl. The stomach was then pexyed to the abdominal wall with 2-0 Vicryl interrupted around the circumference of the gastrostomy tube.   The balloon was filled to 7cc of sterile water.  The bolster was marked at 3cm at the top and a marker was used to mark this area. The bolster was secured to the abdominal wall with 3-0 Nylon sutures.  The fascia was closed in the standard fashion with 0 PDS and the the skin was closed with staples. A honeycomb dressing was placed. The gastrostomy tube was placed to gravity bag.  A binder was placed.   Final inspection revealed acceptable hemostasis. All counts were correct at the end of the case. The patient was awakened from anesthesia and extubated without complication.  The patient went to the PACU in stable condition.   Algis Greenhouse, MD [facility] [address] [place], [place] [address] [phone] (office)

## 2021-01-22 NOTE — Anesthesia Procedure Notes (Signed)
Procedure Name: Intubation Date/Time: 01/22/2021 8:51 AM Performed by: Gayland Curry, CRNA Pre-anesthesia Checklist: Patient identified, Emergency Drugs available, Suction available and Patient being monitored Patient Re-evaluated:Patient Re-evaluated prior to induction Oxygen Delivery Method: Circle system utilized Induction Type: IV induction, Rapid sequence and Cricoid Pressure applied Laryngoscope Size: Mac and 3 Grade View: Grade I Tube type: Oral Tube size: 6.5 mm Number of attempts: 1 Placement Confirmation: ETT inserted through vocal cords under direct vision, positive ETCO2 and breath sounds checked- equal and bilateral Secured at: 20 cm Tube secured with: Tape Dental Injury: Teeth and Oropharynx as per pre-operative assessment

## 2021-01-23 ENCOUNTER — Inpatient Hospital Stay (HOSPITAL_COMMUNITY): Payer: Medicare Other | Admitting: Certified Registered"

## 2021-01-23 ENCOUNTER — Encounter (HOSPITAL_COMMUNITY)
Admission: EM | Disposition: A | Discharge status: Skilled Nursing Facility | Payer: Self-pay | Source: Home / Self Care | Attending: Internal Medicine

## 2021-01-23 ENCOUNTER — Encounter (HOSPITAL_COMMUNITY): Payer: Self-pay | Admitting: Internal Medicine

## 2021-01-23 DIAGNOSIS — R1010 Upper abdominal pain, unspecified: Secondary | ICD-10-CM | POA: Diagnosis not present

## 2021-01-23 DIAGNOSIS — T85528A Displacement of other gastrointestinal prosthetic devices, implants and grafts, initial encounter: Secondary | ICD-10-CM

## 2021-01-23 HISTORY — PX: GASTROSTOMY: SHX5249

## 2021-01-23 LAB — MAGNESIUM: Magnesium: 1.6 mg/dL — ABNORMAL LOW (ref 1.7–2.4)

## 2021-01-23 LAB — CBC
HCT: 30.1 % — ABNORMAL LOW (ref 36.0–46.0)
Hemoglobin: 9.8 g/dL — ABNORMAL LOW (ref 12.0–15.0)
MCH: 31.9 pg (ref 26.0–34.0)
MCHC: 32.6 g/dL (ref 30.0–36.0)
MCV: 98 fL (ref 80.0–100.0)
Platelets: 172 10*3/uL (ref 150–400)
RBC: 3.07 MIL/uL — ABNORMAL LOW (ref 3.87–5.11)
RDW: 15.1 % (ref 11.5–15.5)
WBC: 11.5 10*3/uL — ABNORMAL HIGH (ref 4.0–10.5)
nRBC: 0 % (ref 0.0–0.2)

## 2021-01-23 LAB — BASIC METABOLIC PANEL
Anion gap: 9 (ref 5–15)
BUN: 23 mg/dL (ref 8–23)
CO2: 26 mmol/L (ref 22–32)
Calcium: 8.3 mg/dL — ABNORMAL LOW (ref 8.9–10.3)
Chloride: 107 mmol/L (ref 98–111)
Creatinine, Ser: 0.69 mg/dL (ref 0.44–1.00)
GFR, Estimated: >60 mL/min (ref >60)
Glucose, Bld: 127 mg/dL — ABNORMAL HIGH (ref 70–99)
Potassium: 3 mmol/L — ABNORMAL LOW (ref 3.5–5.1)
Sodium: 142 mmol/L (ref 135–145)

## 2021-01-23 LAB — SURGICAL PATHOLOGY

## 2021-01-23 SURGERY — INSERTION OF GASTROSTOMY TUBE
Anesthesia: General

## 2021-01-23 MED ORDER — PHENYLEPHRINE 40 MCG/ML (10ML) SYRINGE FOR IV PUSH (FOR BLOOD PRESSURE SUPPORT)
PREFILLED_SYRINGE | INTRAVENOUS | Status: DC | PRN
  Administered 2021-01-23: 80 ug via INTRAVENOUS
  Administered 2021-01-23 (×2): 160 ug via INTRAVENOUS

## 2021-01-23 MED ORDER — POTASSIUM CHLORIDE 10 MEQ/100ML IV SOLN
10.0000 meq | INTRAVENOUS | Status: AC
Start: 2021-01-23 — End: 2021-01-23
  Administered 2021-01-23 (×4): 10 meq via INTRAVENOUS
  Filled 2021-01-23 (×8): qty 100

## 2021-01-23 MED ORDER — EPHEDRINE SULFATE-NACL 50-0.9 MG/10ML-% IV SOSY
PREFILLED_SYRINGE | INTRAVENOUS | Status: DC | PRN
  Administered 2021-01-23: 10 mg via INTRAVENOUS

## 2021-01-23 MED ORDER — PROPOFOL 10 MG/ML IV BOLUS
INTRAVENOUS | Status: DC | PRN
  Administered 2021-01-23: 110 mg via INTRAVENOUS

## 2021-01-23 MED ORDER — SUCCINYLCHOLINE CHLORIDE 200 MG/10ML IV SOSY
PREFILLED_SYRINGE | INTRAVENOUS | Status: AC
  Filled 2021-01-23: qty 20

## 2021-01-23 MED ORDER — ONDANSETRON HCL 4 MG/2ML IJ SOLN
INTRAMUSCULAR | Status: AC
  Filled 2021-01-23: qty 2

## 2021-01-23 MED ORDER — SODIUM CHLORIDE 0.9 % IV SOLN
INTRAVENOUS | Status: AC
  Filled 2021-01-23: qty 2

## 2021-01-23 MED ORDER — SODIUM CHLORIDE 0.9 % IV SOLN
2.0000 g | INTRAVENOUS | Status: AC
  Administered 2021-01-23: 2 g via INTRAVENOUS
  Filled 2021-01-23: qty 2

## 2021-01-23 MED ORDER — PHENYLEPHRINE HCL (PRESSORS) 10 MG/ML IV SOLN
INTRAVENOUS | Status: AC
  Filled 2021-01-23: qty 1

## 2021-01-23 MED ORDER — PROPOFOL 10 MG/ML IV BOLUS
INTRAVENOUS | Status: AC
  Filled 2021-01-23: qty 20

## 2021-01-23 MED ORDER — LACTATED RINGERS IV SOLN
INTRAVENOUS | Status: DC
  Administered 2021-01-23: 1000 mL via INTRAVENOUS

## 2021-01-23 MED ORDER — ORAL CARE MOUTH RINSE: 15.0000 mL | Freq: Once | OROMUCOSAL | Status: DC

## 2021-01-23 MED ORDER — FENTANYL CITRATE (PF) 100 MCG/2ML IJ SOLN
INTRAMUSCULAR | Status: AC
  Filled 2021-01-23: qty 2

## 2021-01-23 MED ORDER — ONDANSETRON HCL 4 MG/2ML IJ SOLN
INTRAMUSCULAR | Status: AC
  Filled 2021-01-23: qty 4

## 2021-01-23 MED ORDER — FENTANYL CITRATE (PF) 100 MCG/2ML IJ SOLN: 25.0000 ug | INTRAMUSCULAR | Status: DC | PRN

## 2021-01-23 MED ORDER — LIDOCAINE 2% (20 MG/ML) 5 ML SYRINGE
INTRAMUSCULAR | Status: DC | PRN
  Administered 2021-01-23: 60 mg via INTRAVENOUS

## 2021-01-23 MED ORDER — STERILE WATER FOR IRRIGATION IR SOLN
Status: DC | PRN
  Administered 2021-01-23: 10 mL

## 2021-01-23 MED ORDER — VASOPRESSIN 20 UNIT/ML IV SOLN
INTRAVENOUS | Status: AC
  Filled 2021-01-23: qty 1

## 2021-01-23 MED ORDER — METOPROLOL TARTRATE 5 MG/5ML IV SOLN
5.0000 mg | Freq: Four times a day (QID) | INTRAVENOUS | Status: DC | PRN
  Administered 2021-01-23 – 2021-01-24 (×3): 5 mg via INTRAVENOUS
  Filled 2021-01-23 (×5): qty 5

## 2021-01-23 MED ORDER — CHLORHEXIDINE GLUCONATE CLOTH 2 % EX PADS: 6.0000 | MEDICATED_PAD | Freq: Once | CUTANEOUS | Status: DC

## 2021-01-23 MED ORDER — FENTANYL CITRATE (PF) 100 MCG/2ML IJ SOLN
INTRAMUSCULAR | Status: DC | PRN
  Administered 2021-01-23 (×2): 25 ug via INTRAVENOUS

## 2021-01-23 MED ORDER — SODIUM CHLORIDE 0.9 % IV BOLUS
250.0000 mL | Freq: Once | INTRAVENOUS | Status: AC
  Administered 2021-01-23: 250 mL via INTRAVENOUS

## 2021-01-23 MED ORDER — ROCURONIUM BROMIDE 10 MG/ML (PF) SYRINGE
PREFILLED_SYRINGE | INTRAVENOUS | Status: DC | PRN
  Administered 2021-01-23: 30 mg via INTRAVENOUS

## 2021-01-23 MED ORDER — PHENYLEPHRINE HCL-NACL 10-0.9 MG/250ML-% IV SOLN
INTRAVENOUS | Status: DC | PRN
  Administered 2021-01-23: 100 ug/min via INTRAVENOUS

## 2021-01-23 MED ORDER — SODIUM CHLORIDE 0.9 % IR SOLN
Status: DC | PRN
  Administered 2021-01-23: 1000 mL

## 2021-01-23 MED ORDER — MAGNESIUM SULFATE 2 GM/50ML IV SOLN
2.0000 g | Freq: Once | INTRAVENOUS | Status: AC
  Administered 2021-01-23: 2 g via INTRAVENOUS
  Filled 2021-01-23 (×2): qty 50

## 2021-01-23 MED ORDER — CHLORHEXIDINE GLUCONATE 0.12 % MT SOLN
15.0000 mL | Freq: Once | OROMUCOSAL | Status: DC
  Filled 2021-01-23: qty 15

## 2021-01-23 MED ORDER — SUCCINYLCHOLINE CHLORIDE 200 MG/10ML IV SOSY
PREFILLED_SYRINGE | INTRAVENOUS | Status: DC | PRN
  Administered 2021-01-23: 100 mg via INTRAVENOUS

## 2021-01-23 MED ORDER — ARTIFICIAL TEARS OPHTHALMIC OINT
TOPICAL_OINTMENT | OPHTHALMIC | Status: AC
  Filled 2021-01-23: qty 3.5

## 2021-01-23 MED ORDER — LIDOCAINE HCL (PF) 2 % IJ SOLN
INTRAMUSCULAR | Status: AC
  Filled 2021-01-23: qty 5

## 2021-01-23 MED ORDER — ONDANSETRON HCL 4 MG/2ML IJ SOLN
INTRAMUSCULAR | Status: DC | PRN
  Administered 2021-01-23: 4 mg via INTRAVENOUS

## 2021-01-23 MED ORDER — SUGAMMADEX SODIUM 200 MG/2ML IV SOLN
INTRAVENOUS | Status: DC | PRN
  Administered 2021-01-23: 200 mg via INTRAVENOUS

## 2021-01-23 SURGICAL SUPPLY — 31 items
BINDER ABDOMINAL  9 SM 30-45 (SOFTGOODS) ×2
BINDER ABDOMINAL 9 SM 30-45 (SOFTGOODS) ×1 IMPLANT
CATH GASTROSTOMY 20FR (CATHETERS) ×2 IMPLANT
CHLORAPREP W/TINT 26 (MISCELLANEOUS) ×2 IMPLANT
CLOTH BEACON ORANGE TIMEOUT ST (SAFETY) ×2 IMPLANT
COVER LIGHT HANDLE STERIS (MISCELLANEOUS) ×4 IMPLANT
DRSG OPSITE POSTOP 4X8 (GAUZE/BANDAGES/DRESSINGS) ×2 IMPLANT
ELECT REM PT RETURN 9FT ADLT (ELECTROSURGICAL) ×2
ELECTRODE REM PT RTRN 9FT ADLT (ELECTROSURGICAL) ×1 IMPLANT
GAUZE SPONGE 4X4 12PLY STRL (GAUZE/BANDAGES/DRESSINGS) ×2 IMPLANT
GLOVE SURG ENC MOIS LTX SZ6.5 (GLOVE) ×2 IMPLANT
GLOVE SURG UNDER POLY LF SZ7 (GLOVE) ×4 IMPLANT
GOWN STRL REUS W/TWL LRG LVL3 (GOWN DISPOSABLE) ×4 IMPLANT
INST SET MAJOR GENERAL (KITS) ×2 IMPLANT
KIT TURNOVER KIT A (KITS) ×2 IMPLANT
MANIFOLD NEPTUNE II (INSTRUMENTS) ×2 IMPLANT
NS IRRIG 1000ML POUR BTL (IV SOLUTION) ×2 IMPLANT
PACK MINOR (CUSTOM PROCEDURE TRAY) ×2 IMPLANT
PAD ABD 5X9 TENDERSORB (GAUZE/BANDAGES/DRESSINGS) ×2 IMPLANT
PAD ARMBOARD 7.5X6 YLW CONV (MISCELLANEOUS) ×2 IMPLANT
SET BASIN LINEN APH (SET/KITS/TRAYS/PACK) ×2 IMPLANT
SPONGE T-LAP 18X18 ~~LOC~~+RFID (SPONGE) ×2 IMPLANT
SUT CHROMIC 3 0 SH 27 (SUTURE) ×2 IMPLANT
SUT ETHILON 3 0 FSL (SUTURE) ×2 IMPLANT
SUT PDS AB CT VIOLET #0 27IN (SUTURE) ×4 IMPLANT
SUT SILK 3 0 SH CR/8 (SUTURE) IMPLANT
SUT VIC AB 2-0 CT1 27 (SUTURE) ×2
SUT VIC AB 2-0 CT1 TAPERPNT 27 (SUTURE) ×1 IMPLANT
SYR TOOMEY IRRIG 70ML (MISCELLANEOUS) ×2
SYRINGE TOOMEY IRRIG 70ML (MISCELLANEOUS) ×1 IMPLANT
TOWEL OR 17X26 4PK STRL BLUE (TOWEL DISPOSABLE) ×2 IMPLANT

## 2021-01-23 NOTE — Transfer of Care (Signed)
Immediate Anesthesia Transfer of Care Note  Patient: Rozell Kettlewell Bosshart  Procedure(s) Performed: INSERTION OF GASTROSTOMY TUBE  Patient Location: PACU  Anesthesia Type:General  Level of Consciousness: sedated and patient cooperative  Airway & Oxygen Therapy: Patient Spontanous Breathing and Patient connected to face mask oxygen  Post-op Assessment: Report given to RN, Post -op Vital signs reviewed and stable and Patient moving all extremities  Post vital signs: Reviewed and stable  Last Vitals:  Vitals Value Taken Time  BP    Temp    Pulse    Resp    SpO2      Last Pain:  Vitals:   01/23/21 1217  TempSrc: Oral  PainSc: 0-No pain      Patients Stated Pain Goal: 4 (01/23/21 1217)  Complications: No notable events documented.

## 2021-01-23 NOTE — Progress Notes (Signed)
   01/23/21 0204  Vitals  Temp 98.1 F (36.7 C)  Temp Source Oral  BP (!) 93/57  BP Location Left Arm  BP Method Automatic  Pulse Rate (!) 109  Pulse Rate Source Dinamap  Resp 20  MEWS COLOR  MEWS Score Color Yellow  Oxygen Therapy  SpO2 98 %  O2 Device Room Air  MEWS Score  MEWS Temp 0  MEWS Systolic 1  MEWS Pulse 1  MEWS RR 0  MEWS LOC 0  MEWS Score 2  Patient is a MEWs yellow due to her HR of 109. MD and charge nurse notified.

## 2021-01-23 NOTE — Anesthesia Procedure Notes (Signed)
Procedure Name: Intubation Date/Time: 01/23/2021 1:15 PM Performed by: Lucinda Dell, CRNA Pre-anesthesia Checklist: Patient identified, Emergency Drugs available, Suction available and Patient being monitored Patient Re-evaluated:Patient Re-evaluated prior to induction Oxygen Delivery Method: Circle system utilized Preoxygenation: Pre-oxygenation with 100% oxygen Induction Type: IV induction, Rapid sequence and Cricoid Pressure applied Laryngoscope Size: Glidescope and 3 Grade View: Grade I Tube type: Oral Tube size: 7.0 mm Number of attempts: 1 Airway Equipment and Method: Stylet and Video-laryngoscopy Placement Confirmation: ETT inserted through vocal cords under direct vision, positive ETCO2 and breath sounds checked- equal and bilateral Secured at: 21 cm Tube secured with: Tape Dental Injury: Teeth and Oropharynx as per pre-operative assessment  Comments: Used Glidescope d/t patient vomiting and need for RSI and suctioning. Good airway. +BBS, +EtCO2.

## 2021-01-23 NOTE — Progress Notes (Signed)
[facility]  Lower Bps with induction, required transient pressors. Will go to step down for monitoring overnight.   Resolved after anesthesia ended. Minimal contamination to none intraabdominal from G tube dislodgement.   G tube replaced. G tube to remain under binder and secured to patient overnight. Can do oral meds and sips at 2100 tonight. Will assess the tube tomorrow and nothing per tube for now.   G tube secured with pexy intraabdominal, 3-0 Nylon to the skin, balloon with 7 cc of sterile water, and G tube taped down with ABD and Binder in place.  Updated Rainy and Dr. Sherryll Burger and RN.  Algis Greenhouse, MD [facility] [address] [place], [place] [address] [phone] (office)

## 2021-01-23 NOTE — Care Management Important Message (Signed)
Important Message  Patient Details  Name: Kathryn [name] MRN: [id number] Date of Birth: 1941-03-30   Medicare Important Message Given:  Yes     Corey Harold 01/23/2021, 12:13 PM

## 2021-01-23 NOTE — Anesthesia Preprocedure Evaluation (Addendum)
Anesthesia Evaluation  Patient identified by MRN, date of birth, ID band Patient awake    Reviewed: Allergy & Precautions, NPO status , Patient's Chart, lab work & pertinent test results, reviewed documented beta blocker date and time   History of Anesthesia Complications Negative for: history of anesthetic complications  Airway Mallampati: III  TM Distance: >3 FB Neck ROM: Full    Dental  (+) Dental Advisory Given, Edentulous Upper, Missing   Pulmonary neg pulmonary ROS,    Pulmonary exam normal breath sounds clear to auscultation       Cardiovascular Exercise Tolerance: Good hypertension, Pt. on home beta blockers and Pt. on medications  Rhythm:Regular Rate:Tachycardia     Neuro/Psych  Neuromuscular disease    GI/Hepatic GERD  Medicated and Poorly Controlled,  Endo/Other  diabetes, Well Controlled, Type 2  Renal/GU      Musculoskeletal   Abdominal   Peds  Hematology  (+) Blood dyscrasia, anemia ,   Anesthesia Other Findings Hypokalemia  Prolonged QT  Reproductive/Obstetrics                            Anesthesia Physical  Anesthesia Plan  ASA: 4 and emergent  Anesthesia Plan: General   Post-op Pain Management:    Induction:   PONV Risk Score and Plan: Ondansetron  Airway Management Planned: Oral ETT  Additional Equipment:   Intra-op Plan:   Post-operative Plan:   Informed Consent: I have reviewed the patients History and Physical, chart, labs and discussed the procedure including the risks, benefits and alternatives for the proposed anesthesia with the patient or authorized representative who has indicated his/her understanding and acceptance.     Dental advisory given  Plan Discussed with: CRNA  Anesthesia Plan Comments:        Anesthesia Quick Evaluation

## 2021-01-23 NOTE — Progress Notes (Signed)
Nutrition Follow-up  DOCUMENTATION CODES:   Non-severe (moderate) malnutrition in context of chronic illness  INTERVENTION:  Recommend consider starting IV- D5% while pt is waiting for PEG replacement due to her NPO status.  If/when patient has PEG replaced recommend: Initiate Osmolite 1.2 @ 20 ml/hr via PEG and increase by 10 ml every 8 hours to goal rate of 55 ml/hr.   Tube feeding regimen provides 1584 kcal, 73.2 grams of protein, and 1082 ml of H2O.    Monitor magnesium, potassium, and phosphorus daily for at least 3 days, MD to replete as needed, as pt is at risk for refeeding syndrome given prolonged poor intake.   NUTRITION DIAGNOSIS:   Moderate Malnutrition related to chronic illness (achalasia) as evidenced by energy intake < or equal to 75% for > or equal to 1 month, mild fat depletion, mild muscle depletion.   GOAL:  Patient will meet greater than or equal to 90% of their needs   MONITOR:  Diet advancement, Supplement acceptance, PO intake, Weight trends  REASON FOR ASSESSMENT:   Malnutrition Screening Tool    ASSESSMENT: patient is a 80 yo female with history of DM-2, HTN, GERD and chronic achalasia. Patient presents with epigastric pain, weakness and dehydration.   Positive ANA and SSA antibody (to follow up with Rheumatology).  Patient is currently NPO. Poor tolerance of oral intake per chart review. No family at bedside during RD visit. Expect pt meeting < 75% of energy needs the past month based on chart review. RD assessed pt on 7/1 and she was eating very little at that time. Question if pt a candidate for tube feeding if chronically unable to meet nutrition needs orally.   Weight loss reported per chart review. Patient weight 10/24/20 -56.7 kg (124.7 lb) and currently 54.4 kg (119.6 lb).    7/20 SLP swallow evaluation- dysphagia NPO   7/21 EGD w/botox. Stomach filled with fluid, possible gastroparesis. Palliative GOC-pt doesn't want a feeding tube  7/22  Per GI- consider Heller myotomy or POEM. Patient suctions excess fluids and is open to further discuss tube feeding. Diet advanced to clear liquids.   7/26 Per GI abdominal x-ray question for ileus. General surgery consulted possible G-tube placement Friday.   7/27 Discussed patient during rounds. Clear liquid diet and protein with intake 0-50% the past 3 days. There are at least 6-8 containers of fluids staked on pt table besides the untouched liquids on her tray. Talked with patient and daughter who was bedside trying to encourage pt to take fluids. No intake at lunch and daughter says pt is afraid she will vomit. Per daughter they have decided to pursue PEG placement and plans are for Friday. Recommendations for enteral feeding above. Patient is high refeeding risk due to her prolong poor intake.   7/28 Open gastrostomy tube placed due to malnutrition, esophageal dysmotility. No enteral feeding only use for medications per MD.   7/29 SLP has signed off. Patient g-tube pulled out overnight. Will require operating room for replacement. Patient remains NPO. Plans were to start tube feeding today per MD note. Recommend consider providing D5% while pt is waiting for new PEG. Last BM 7/25 per nursing.  Medications reviewed and include:  protonix.  Labs: BMP Latest Ref Rng & Units 01/23/2021 01/20/2021 01/20/2021  Glucose 70 - 99 mg/dL 952(W) 71 -  BUN 8 - 23 mg/dL 23 14 -  Creatinine 4.13 - 1.00 mg/dL 2.44 0.10 2.72  Sodium 135 - 145 mmol/L 142 137 -  Potassium 3.5 - 5.1 mmol/L 3.0(L) 3.8 -  Chloride 98 - 111 mmol/L 107 103 -  CO2 22 - 32 mmol/L 26 20(L) -  Calcium 8.9 - 10.3 mg/dL 8.3(L) 8.9 -      NUTRITION - FOCUSED PHYSICAL EXAM: Nutrition-Focused physical exam completed. Findings are mild upper arm fat depletion, moderate clavicle and mild temporal muscle depletion, and no edema.     Diet Order:   Diet Order             Diet NPO time specified  Diet effective now                    EDUCATION NEEDS:  Not appropriate for education at this time  Skin:  Skin Assessment: Reviewed RN Assessment  Last BM:  7/25   Height:   Ht Readings from Last 1 Encounters:  01/13/21 5\' 3"  (1.6 m)    Weight:   Wt Readings from Last 1 Encounters:  01/13/21 54.4 kg    Ideal Body Weight:   52 kg  BMI:  Body mass index is 21.26 kg/m.  Estimated Nutritional Needs:   Kcal:  1600-1795  Protein:  75-80 gr  Fluid:  1.6-1.7 liters daily  01/15/21 MS,RD,CSG,LDN Contact: Royann Shivers

## 2021-01-23 NOTE — Op Note (Signed)
[facility] Operative Note  01/23/21  Preoperative Diagnosis: Dislodged Gastrostomy tube    Postoperative Diagnosis: Same   Procedure(s) Performed: Reinsertion of gastrostomy tube 20 french, pexyed to anterior abdominal wall   Surgeon: Leatrice Jewels. Henreitta Leber, MD   Assistants: No qualified resident was available    Anesthesia: General endotracheal   Anesthesiologist: Dr. Johnnette Litter   Specimens: None    Estimated Blood Loss: Minimal   Blood Replacement: None    Complications: None   Wound Class:Contaminated    Operative Indications: Ms. Schaber is an 80 yo with malnutrition, dysphagia and likely some dysmotility issues who I placed a 24 french open G tube in 7/28 so that it could be changed to a GJ by IR in about 1 week to help with nutrition in a patient who will likely not tolerate gastric feeds. Unfortunately this AM the RN noted that her Frutoso Chase was out of the gastrostomy site and the balloon was busted and the sutures holding the bolster were snapped. I discussed reinsertion with the patient under anesthesia given that I cannot get this large tube back into the track.   Findings: Pexyed stomach with no leakage of gastric contents into abdominal cavity, replacement of gastrostomy tube through gastrostomy site    Procedure: The patient was taken to the operating room and placed supine. General endotracheal anesthesia was induced. Intravenous antibiotics were administered per protocol.  The gastrostomy tube was already removed. The abdomen was prepared and draped in the usual sterile fashion.   The staples were removed. The midline was opened by cutting the PDS. The abdomen was entered and the stomach was pexyed to the anterior abdominal wall without leakage of gastric contents. The small bowel anastomosis was intact. Care was taken to remove the pexy of the gastric wall closest to me. A new 20 french gastrostomy tube was placed through the gastrostomy site and the balloon was  tested and stayed inflated. This was then placed into the stomach. The balloon was filled with 7 cc of sterile water. A new 3-0 Vicryl pursestring was placed around the tube and secured. 2-0 Vicryl was used to replace the pexy of the stomach to the abdominal wall on the medial portion of the entry.  The catheter was flushed easily and a fluid wave of gastric contents was noted in the tubing.   The abdomen was irrigated. The fascia was closed in the standard fashion with 0 PDS and the skin was closed with staples. The gastrostomy tube was secured with 3-0 Nylon in 3 places on the bolster and the bolster was marked at 4cm position with a marker to ensure it does not move. The gastrostomy tube was secured to the abdominal wall with an ABD and papertape. A honeycomb dressing was placed on the midline.   Final inspection revealed acceptable hemostasis. All counts were correct at the end of the case. The patient was awakened from anesthesia and extubated without complication.  The patient went to the PACU in stable condition.   Algis Greenhouse, MD [facility] [address] [place], [place] [address] [phone] (office)

## 2021-01-23 NOTE — Plan of Care (Signed)
  Problem: Education: Goal: Knowledge of General Education information will improve Description: Including pain rating scale, medication(s)/side effects and non-pharmacologic comfort measures Outcome: Not Progressing   Problem: Health Behavior/Discharge Planning: Goal: Ability to manage health-related needs will improve Outcome: Not Progressing   Problem: Clinical Measurements: Goal: Ability to maintain clinical measurements within normal limits will improve Outcome: Progressing Goal: Will remain free from infection Outcome: Progressing Goal: Diagnostic test results will improve Outcome: Progressing   Problem: Activity: Goal: Risk for activity intolerance will decrease Outcome: Not Progressing   Problem: Nutrition: Goal: Adequate nutrition will be maintained Outcome: Not Progressing   Problem: Pain Managment: Goal: General experience of comfort will improve Outcome: Progressing   Problem: Safety: Goal: Ability to remain free from injury will improve Outcome: Progressing   Problem: Skin Integrity: Goal: Risk for impaired skin integrity will decrease Outcome: Not Progressing

## 2021-01-23 NOTE — Progress Notes (Addendum)
MD ordered NS bolus.

## 2021-01-23 NOTE — Anesthesia Postprocedure Evaluation (Signed)
Anesthesia Post Note  Patient: Kathryn [name]  Procedure(s) Performed: INSERTION OF GASTROSTOMY TUBE  Patient location during evaluation: Phase II Anesthesia Type: General Level of consciousness: awake Pain management: pain level controlled Vital Signs Assessment: post-procedure vital signs reviewed and stable Respiratory status: spontaneous breathing and respiratory function stable Cardiovascular status: blood pressure returned to baseline and stable Postop Assessment: no headache and no apparent nausea or vomiting Anesthetic complications: no Comments: Late entry   No notable events documented.   Last Vitals:  Vitals:   01/23/21 1430 01/23/21 1445  BP: (!) 155/82 (!) 160/84  Pulse: (!) 106 (!) 112  Resp: (!) 26 (!) 33  Temp:    SpO2: 100% 100%    Last Pain:  Vitals:   01/23/21 1445  TempSrc:   PainSc: 0-No pain                 Windell Norfolk

## 2021-01-23 NOTE — Progress Notes (Signed)
   01/23/21 0605  Vitals  Temp 98.6 F (37 C)  Temp Source Oral  BP 107/64  MAP (mmHg) 75  BP Location Left Arm  BP Method Automatic  Patient Position (if appropriate) Lying  Pulse Rate (!) 120  Pulse Rate Source Monitor  Resp 17  MEWS COLOR  MEWS Score Color Yellow  Oxygen Therapy  SpO2 97 %  O2 Device Room Air  MEWS Score  MEWS Temp 0  MEWS Systolic 0  MEWS Pulse 2  MEWS RR 0  MEWS LOC 0  MEWS Score 2  Blood pressure is 107/64. Paged MD to notify if it was okay to admin schedule metoprolol.

## 2021-01-23 NOTE — Progress Notes (Signed)
[facility]   Called by RN and gastrostomy tube out of the abdominal wall and binder soaked with gastric contents. Had been to gravity overnight after meds given stomach distention.  Came to bedside and sutures are out of the G tube on 2 of the 3 secured sites on the bolster, the balloon is down, and gastric contents are coming out of the gastrostomy site. I will not be able to get this 24 french tube back into this site.   I had pexyed the stomach and had 2 pursestrings.  There was a significant amount of force that pulled this tube out.  Patient does not recall pulling but has been reported to have been pulling on the purewick last night per RN.  No falls or other things that would have caused this. Lajoyce Corners was in place. The only thing I can think is aggressive pulling or tugging on the tube connected to the gastrostomy tube caused this to be dislodged.   Will have to go to the OR for replacement. Discussed risk of bleeding, infection, needing to keep tube.   BP 107/64 (BP Location: Left Arm)   Pulse (!) 120   Temp 98.6 F (37 C) (Oral)   Resp 17   Ht 5\' 3"  (1.6 m)   Wt 54.4 kg   SpO2 97%   BMI 21.26 kg/m  No abdominal pain Bile from gastrostomy site Soft G tube partially attached to skin at 1/3 bolster site sutures   , MD [facility] [address] [place], [place] [phone] (office)

## 2021-01-23 NOTE — Progress Notes (Signed)
PROGRESS NOTE    BRITTANE GRUDZINSKI  XQJ:[id number] DOB: 1941/02/17 DOA: 01/13/2021 PCP: Suzan Slick, MD   Brief Narrative:   Kathryn [name] is a 80 y.o. female with medical history significant for  hypertension, type 2 diabetes mellitus, GERD, positive ANA and SSA autoantibodies, and chronic achalasia (2014 manometry showed abnormal motility)  who presents to the emergency department due to more than 2 months history of decreased oral intake, generalized weakness and about 3 to 4-week onset of abdominal pain associated with difficulty in being able to swallow (solid and liquid).  She believes that she must have lost up to 30 pounds since onset of symptoms.  Patient endorsed that she usually regurgitates any food/liquid ingested within 15 to 20 minutes of ingestion.  Abdominal pain was in the epigastric area and was sharp in nature, this worsens with food.  She denies fever, chills, chest pain, shortness of breath.   She was admitted to the hospital from 12/25/2020-12/28/2020, at that time barium p.o. esophagram done urgently as an outpatient was indicated.  She was also admitted from 10/23/2020 to 10/25/2020 during which patient presented with proximal muscle weakness with an elevated CPK of 1206 that was concerning for polymyalgia rheumatica. This was treated with prednisone with improvement.  EGD done on 10/04/2020 showed normal dilated esophagus and small hiatal hernia.   In the emergency department, she was intermittently tachypneic and tachycardic.  Other vital signs were within normal range.  Work-up in the ED showed normocytic anemia and leukocytosis, BMP was normal except for elevated BUN at and hyperglycemia with CBG of 135.  Albumin 3.2. CT of head without contrast showed no acute intracranial abnormality. IV hydration was provided, hospitalist was asked to admit patient for further evaluation and management.   7/20 - Patient still experiences significant epigastric pain. Using suction for  oral secretion. No other acute complaints. GI (Dr. Karilyn Cota) was consulted and will provide further recommendations. Of note, patient has not been able to follow up with outpatient rheumatology and has not been receiving treatments    7/21 - Patient is clinically stable. EGD with botox treatment was successfully completed.   7/22 - Patient continues to have difficulty swallowing.  Began discussion with patient regarding possible need for temporary PEG placement if needed.   7/24-7/25 - pt making trials of liquids but only tolerating small amounts of liquids.     7/26 -7/27- recurrent abdominal pain, unable to take any liquids today.  Still suctioning frequently.  Agreeable to PEG consultation and placement. Will start IVF since intake is minimal.   7/28-patient has undergone open gastrostomy tube placement and has tube to gravity with plans to keep n.p.o. until 7/29.  7/29-patient has had dislodgment of G-tube with need for replacement per general surgery today.  Assessment & Plan:   Principal Problem:   Abdominal pain Active Problems:   HTN (hypertension)   Dysphagia   Nausea and vomiting   Generalized weakness   Dehydration   Normocytic anemia   Leukocytosis   Hyperglycemia   Hypoalbuminemia due to protein-calorie malnutrition (HCC)   Prolonged QT interval   Achalasia   Malnutrition of moderate degree   Hypoactive bowel sounds   Epigastric/Abdominal pain, nausea and vomiting in the setting of esophageal dysphagia Patient is ANA/SSA antibody positive. Sjogren's syndrome and SLE are on the differential. Patient also has had increased oral secretion, suggestive of sicca-related symptoms. Gastrointestinal involvement of SLE can manifest as esophageal motility disorder, such as achalasia, which has been suggesting  in this patient. Other GI manifestations of SLE include acute pancreatitis, for which  patient signs and symptoms are concerning, but CT 6/30 has shown no acute pancreatic  findings. EGD in April 2022 showed unremarkable esophagus, and patient underwent dialtion given history of dysphagia, but patient states that did not help with symptoms.   -Continue IV morphine 0.5 mg q.4h p.r.n. for severe pain -Continue Protonix 40 mg po BID -Continue clear liquid diet with plan to advanced diet as tolerated -Gastroenterology following for further recommendations -ongoing goals of care discussions with palliative team, patient and daughter regarding tube feeding -Patient has undergone G-tube placement on 7/28 with dislodgment noted on 7/29 which will require replacement.  Hypokalemia/hypomagnesemia -Replete and reevaluate in a.m.   Generalized weakness and dehydration in the setting of above -Hypoalbuminemia secondary to mild protein calorie malnutrition -Continue IV hydration as described above -Continue protein supplement - surgery consult for PEG placement likely 7/29   Urinary tract infection - TREATED - UA in the ED showed many bacteria, moderate ketones, nitrites -Patient is on ceftriaxone 1g for coverage (completed on 7/23)  Normocytic anemia -Chronic and Stable  Leukocytosis/hyperglycemia possibly reactive - WBC normalized - Continue to monitor WBC and blood glucose with morning labs   Prolonged QTc (RESOLVED) - Avoid QT prolonging drugs - Magnesium 1.9 - Repeated EKG with resolution of prolonged QTc   COVID-19 infection (incidental finding) -Patient is asymptomatic and stable on room air -This was already diagnosed during last admission   Essential hypertension -Continue Lopressor but since can't take tabs now, we changed it to IV lopressor 2.5 mg Q6h until she can take the tablets again  Hyperglycemia secondary to T2DM -Hemoglobin A1c on 6/30-5.8%   GERD -Continue Protonix     DVT prophylaxis:SCD Code Status: Full Family Communication: Daughter at bedside 7/28 Disposition Plan:  Status is: Inpatient   Remains inpatient appropriate  because:Ongoing diagnostic testing needed not appropriate for outpatient work up and IV treatments appropriate due to intensity of illness or inability to take PO   Dispo: The patient is from: Home              Anticipated d/c is to: SNF              Patient currently is not medically stable to d/c.              Difficult to place patient No   Consultants:  Gastroenterology Palliative care Surgery (regarding PEG placement)   Procedures:  Barium swallow 7/20 EGD with botox injection 7/21 Open gastrostomy tube placement with lysis of adhesions and small bowel resection 7/28  Antimicrobials:  Anti-infectives (From admission, onward)    Start     Dose/Rate Route Frequency Ordered Stop   01/23/21 1300  cefoTEtan (CEFOTAN) 2 g in sodium chloride 0.9 % 100 mL IVPB        2 g 200 mL/hr over 30 Minutes Intravenous On call to O.R. 01/23/21 0959 01/23/21 1319   01/23/21 1157  sodium chloride 0.9 % with cefoTEtan (CEFOTAN) ADS Med       Note to Pharmacy: Devoria Glassing   : cabinet override      01/23/21 1157 01/23/21 1411   01/22/21 0600  ceFAZolin (ANCEF) IVPB 2g/100 mL premix        2 g 200 mL/hr over 30 Minutes Intravenous On call to O.R. 01/21/21 1747 01/22/21 0900   01/14/21 0830  cefTRIAXone (ROCEPHIN) 1 g in sodium chloride 0.9 % 100 mL IVPB  1 g 200 mL/hr over 30 Minutes Intravenous Every 24 hours 01/14/21 0735 01/16/21 1023   01/14/21 0800  fosfomycin (MONUROL) packet 3 g  Status:  Discontinued        3 g Oral  Once 01/14/21 7096 01/14/21 0734       Subjective: Patient seen and evaluated today with no new acute complaints or concerns.  Noted to have G-tube dislodgment today.  Objective: Vitals:   01/23/21 0204 01/23/21 0355 01/23/21 0605 01/23/21 1217  BP: (!) 93/57 99/60 107/64 132/68  Pulse: (!) 118 (!) 121 (!) 120 (!) 108  Resp: 20 20 17  (!) 25  Temp: 98.1 F (36.7 C) 98.1 F (36.7 C) 98.6 F (37 C) 98.4 F (36.9 C)  TempSrc: Oral Oral Oral Oral  SpO2: 98%  97% 97% 96%  Weight:      Height:        Intake/Output Summary (Last 24 hours) at 01/23/2021 1413 Last data filed at 01/23/2021 0900 Gross per 24 hour  Intake 458.49 ml  Output 300 ml  Net 158.49 ml   Filed Weights   01/13/21 1909  Weight: 54.4 kg    Examination:  General exam: Appears calm and comfortable  Respiratory system: Clear to auscultation. Respiratory effort normal. Cardiovascular system: S1 & S2 heard, RRR.  Gastrointestinal system: Abdomen is soft, abdominal binder present.  G-tube dislodged Central nervous system: Alert and awake Extremities: No edema Skin: No significant lesions noted Psychiatry: Flat affect.    Data Reviewed: I have personally reviewed following labs and imaging studies  CBC: Recent Labs  Lab 01/17/21 0548 01/18/21 0554 01/20/21 0413 01/23/21 0834  WBC 8.5 9.2 10.2 11.5*  NEUTROABS  --   --  8.9*  --   HGB 10.7* 10.5* 11.5* 9.8*  HCT 32.2* 32.2* 36.3 30.1*  MCV 99.4 98.8 101.4* 98.0  PLT 143* 147* 176 172   Basic Metabolic Panel: Recent Labs  Lab 01/17/21 0548 01/18/21 0554 01/20/21 0413 01/23/21 0834  NA 138 137 137 142  K 3.3* 3.8 3.8 3.0*  CL 104 105 103 107  CO2 29 26 20* 26  GLUCOSE 91 72 71 127*  BUN 10 10 14 23   CREATININE 0.46 0.42* 0.59  0.63 0.69  CALCIUM 8.6* 8.7* 8.9 8.3*  MG  --  1.7  --  1.6*  PHOS  --  2.5  --   --    GFR: Estimated Creatinine Clearance: 45.6 mL/min (by C-G formula based on SCr of 0.69 mg/dL). Liver Function Tests: Recent Labs  Lab 01/20/21 0413  AST 27  ALT 15  ALKPHOS 65  BILITOT 1.1  PROT 7.4  ALBUMIN 2.9*   Recent Labs  Lab 01/20/21 0413  LIPASE 118*   No results for input(s): AMMONIA in the last 168 hours. Coagulation Profile: No results for input(s): INR, PROTIME in the last 168 hours. Cardiac Enzymes: No results for input(s): CKTOTAL, CKMB, CKMBINDEX, TROPONINI in the last 168 hours. BNP (last 3 results) No results for input(s): PROBNP in the last 8760  hours. HbA1C: No results for input(s): HGBA1C in the last 72 hours. CBG: Recent Labs  Lab 01/22/21 1100  GLUCAP 110*   Lipid Profile: No results for input(s): CHOL, HDL, LDLCALC, TRIG, CHOLHDL, LDLDIRECT in the last 72 hours. Thyroid Function Tests: No results for input(s): TSH, T4TOTAL, FREET4, T3FREE, THYROIDAB in the last 72 hours. Anemia Panel: No results for input(s): VITAMINB12, FOLATE, FERRITIN, TIBC, IRON, RETICCTPCT in the last 72 hours. Sepsis Labs: No results for  input(s): PROCALCITON, LATICACIDVEN in the last 168 hours.  Recent Results (from the past 240 hour(s))  Surgical pcr screen     Status: Abnormal   Collection Time: 01/22/21  8:16 AM   Specimen: Nasal Mucosa; Nasal Swab  Result Value Ref Range Status   MRSA, PCR POSITIVE (A) NEGATIVE Final    Comment: RESULT CALLED TO, READ BACK BY AND VERIFIED WITH: EMILY WRIGHT,RN @1152  01/22/2021 KAY    Staphylococcus aureus POSITIVE (A) NEGATIVE Final    Comment: RESULT CALLED TO, READ BACK BY AND VERIFIED WITH: EMILY WRIGHT,RN @1152  01/22/2021 KAY (NOTE) The Xpert SA Assay (FDA approved for NASAL specimens in patients 82 years of age and older), is one component of a comprehensive surveillance program. It is not intended to diagnose infection nor to guide or monitor treatment. Performed at [facility], [address] [place], [place] [address]          Radiology Studies: No results found.      Scheduled Meds:  chlorhexidine  15 mL Mouth/Throat Once   Or   mouth rinse  15 mL Mouth Rinse Once   [MAR Hold] Chlorhexidine Gluconate Cloth  6 each Topical Q0600   Chlorhexidine Gluconate Cloth  6 each Topical Once   [MAR Hold] metoprolol tartrate  2.5 mg Intravenous Q6H   [MAR Hold] mupirocin ointment  1 application Nasal BID   [MAR Hold] neomycin-bacitracin-polymyxin  1 application Topical Daily   [MAR Hold] pantoprazole  40 mg Oral BID   [MAR Hold] scopolamine  1 patch Transdermal Q72H   Continuous  Infusions:  lactated ringers 75 mL/hr at 01/23/21 [id number]   lactated ringers 10 mL/hr at 01/23/21 1306   magnesium sulfate bolus IVPB     potassium chloride       LOS: 10 days    Time spent: 35 minutes    Magdeline Prange Hoover Brunette Kearstyn Avitia, DO [facility]  If 7PM-7AM, please contact night-coverage [email or web address] 01/23/2021, 2:13 PM

## 2021-01-23 NOTE — Progress Notes (Signed)
  Speech Language Pathology Treatment: Dysphagia  Patient Details Name: Kathryn [name] MRN: [id number] DOB: 19-Apr-1941 Today's Date: 01/23/2021 Time: 0730-0749 SLP Time Calculation (min) (ACUTE ONLY): 19 min  Assessment / Plan / Recommendation Clinical Impression  Ongoing diagnostic dysphagia therapy provided this am; Pt had PEG placed yesterday and reports she continues to be unable to swallow anything. Pt was agreeable to 3-4 ice chips (one at a time), after a brief time (~12mins) Pt cleared her throat and used oral suction to clear regurgitated ice chips. There are no further ST needs at this time secondary to Pt receiving nutrition by alternative means and Pt's inability to consume PO. Recommend continue NPO, oral care QID and ice chips after oral care for comfort with oral suction to remain available to Pt. ST will sign off at this time; Please re-order ST if needed. Thank you.   HPI HPI: Kathryn [name] is a 80 y.o. female with medical history significant for  hypertension, type 2 diabetes mellitus, GERD, positive ANA and SSA autoantibodies who presents to the emergency department due to more than 2 months history of decreased oral intake, generalized weakness and about 3 to 4-week onset of abdominal pain associated with difficulty in being able to swallow (solid and liquid).  She believes that she must have lost up to 30 pounds since onset of symptoms.  Patient endorsed that she usually regurgitates any food/liquid ingested within 15 to 20 minutes of ingestion.  Abdominal pain was in the epigastric area and was sharp in nature, this worsens with food.  She denies fever, chills, chest pain, shortness of breath.  She was admitted to the hospital from 12/25/2020-12/28/2020, at that time barium p.o. esophagram done urgently as an outpatient was indicated.  She was also admitted from 10/23/2020 to 10/25/2020 during which patient presented with proximal muscle weakness with an elevated CPK of 1206, this was  treated with prednisone with improvement.  EGD done on 10/04/2020 showed normal dilated esophagus and small hiatal hernia.       SLP Plan  Discharge SLP treatment due to (comment) (no further ST needs at this time; PEG placed Pt unable to consume PO)       Recommendations  Diet recommendations: NPO                Oral Care Recommendations: Oral care QID;Oral care prior to ice chip/H20 Plan: Discharge SLP treatment due to (comment) (no further ST needs at this time; PEG placed Pt unable to consume PO)       Kathryn [name] H. Kathryn [name], CCC-SLP Speech Language Pathologist    Kathryn [name] 01/23/2021, 7:56 AM

## 2021-01-24 DIAGNOSIS — R1010 Upper abdominal pain, unspecified: Secondary | ICD-10-CM | POA: Diagnosis not present

## 2021-01-24 LAB — BASIC METABOLIC PANEL
Anion gap: 6 (ref 5–15)
BUN: 20 mg/dL (ref 8–23)
CO2: 26 mmol/L (ref 22–32)
Calcium: 8.2 mg/dL — ABNORMAL LOW (ref 8.9–10.3)
Chloride: 107 mmol/L (ref 98–111)
Creatinine, Ser: 0.64 mg/dL (ref 0.44–1.00)
GFR, Estimated: >60 mL/min (ref >60)
Glucose, Bld: 108 mg/dL — ABNORMAL HIGH (ref 70–99)
Potassium: 3.9 mmol/L (ref 3.5–5.1)
Sodium: 139 mmol/L (ref 135–145)

## 2021-01-24 LAB — CBC
HCT: 37.7 % (ref 36.0–46.0)
Hemoglobin: 11.8 g/dL — ABNORMAL LOW (ref 12.0–15.0)
MCH: 31.6 pg (ref 26.0–34.0)
MCHC: 31.3 g/dL (ref 30.0–36.0)
MCV: 101.1 fL — ABNORMAL HIGH (ref 80.0–100.0)
Platelets: 161 10*3/uL (ref 150–400)
RBC: 3.73 MIL/uL — ABNORMAL LOW (ref 3.87–5.11)
RDW: 15 % (ref 11.5–15.5)
WBC: 7.8 10*3/uL (ref 4.0–10.5)
nRBC: 0 % (ref 0.0–0.2)

## 2021-01-24 LAB — GLUCOSE, CAPILLARY
Glucose-Capillary: 84 mg/dL (ref 70–99)
Glucose-Capillary: 94 mg/dL (ref 70–99)
Glucose-Capillary: 98 mg/dL (ref 70–99)

## 2021-01-24 LAB — MAGNESIUM: Magnesium: 2.1 mg/dL (ref 1.7–2.4)

## 2021-01-24 MED ORDER — VITAL HIGH PROTEIN PO LIQD
1000.0000 mL | ORAL | Status: DC
  Administered 2021-01-24: 1000 mL

## 2021-01-24 MED ORDER — OXYCODONE HCL 20 MG/ML PO CONC
5.0000 mg | ORAL | Status: DC | PRN
  Administered 2021-01-24 – 2021-02-04 (×7): 5 mg
  Filled 2021-01-24 (×8): qty 1

## 2021-01-24 MED ORDER — ACETAMINOPHEN 325 MG PO TABS: 650.0000 mg | ORAL_TABLET | Freq: Four times a day (QID) | ORAL | Status: DC | PRN

## 2021-01-24 MED ORDER — OSMOLITE 1.2 CAL PO LIQD
1000.0000 mL | ORAL | Status: DC
  Administered 2021-01-24 – 2021-01-25 (×2): 1000 mL
  Filled 2021-01-24 (×5): qty 1000

## 2021-01-24 MED ORDER — PANTOPRAZOLE SODIUM 40 MG PO PACK
40.0000 mg | PACK | Freq: Two times a day (BID) | ORAL | Status: DC
  Administered 2021-01-24 – 2021-02-06 (×23): 40 mg
  Filled 2021-01-24 (×26): qty 20

## 2021-01-24 MED ORDER — FREE WATER
30.0000 mL | Status: DC
  Administered 2021-01-24 – 2021-02-04 (×54): 30 mL

## 2021-01-24 NOTE — Progress Notes (Addendum)
Patient offered PO water. Unable to tolerate PO fluids without coughing and choking. Oral Suction performed. Diet changed to NPO. Will continue to monitor.

## 2021-01-24 NOTE — Progress Notes (Signed)
PROGRESS NOTE    Kathryn [name]  ZLD:[id number] DOB: [birth year] DOA: 01/13/2021 PCP: Suzan Slick, MD   Brief Narrative:   Kathryn [name] is a 80 y.o. female with medical history significant for  hypertension, type 2 diabetes mellitus, GERD, positive ANA and SSA autoantibodies, and chronic achalasia (2014 manometry showed abnormal motility)  who presents to the emergency department due to more than 2 months history of decreased oral intake, generalized weakness and about 3 to 4-week onset of abdominal pain associated with difficulty in being able to swallow (solid and liquid).  She believes that she must have lost up to 30 pounds since onset of symptoms.  Patient endorsed that she usually regurgitates any food/liquid ingested within 15 to 20 minutes of ingestion.  Abdominal pain was in the epigastric area and was sharp in nature, this worsens with food.  She denies fever, chills, chest pain, shortness of breath.   She was admitted to the hospital from 12/25/2020-12/28/2020, at that time barium p.o. esophagram done urgently as an outpatient was indicated.  She was also admitted from 10/23/2020 to 10/25/2020 during which patient presented with proximal muscle weakness with an elevated CPK of 1206 that was concerning for polymyalgia rheumatica. This was treated with prednisone with improvement.  EGD done on 10/04/2020 showed normal dilated esophagus and small hiatal hernia.   In the emergency department, she was intermittently tachypneic and tachycardic.  Other vital signs were within normal range.  Work-up in the ED showed normocytic anemia and leukocytosis, BMP was normal except for elevated BUN at and hyperglycemia with CBG of 135.  Albumin 3.2. CT of head without contrast showed no acute intracranial abnormality. IV hydration was provided, hospitalist was asked to admit patient for further evaluation and management.   7/20 - Patient still experiences significant epigastric pain. Using suction for  oral secretion. No other acute complaints. GI (Dr. Karilyn Cota) was consulted and will provide further recommendations. Of note, patient has not been able to follow up with outpatient rheumatology and has not been receiving treatments    7/21 - Patient is clinically stable. EGD with botox treatment was successfully completed.   7/22 - Patient continues to have difficulty swallowing.  Began discussion with patient regarding possible need for temporary PEG placement if needed.   7/24-7/25 - pt making trials of liquids but only tolerating small amounts of liquids.     7/26 -7/27- recurrent abdominal pain, unable to take any liquids today.  Still suctioning frequently.  Agreeable to PEG consultation and placement. Will start IVF since intake is minimal.   7/28-patient has undergone open gastrostomy tube placement and has tube to gravity with plans to keep n.p.o. until 7/29.   7/29-patient has had dislodgment of G-tube with need for replacement per general surgery today.  7/30-CLD and trickle feeds initiated per General Surgery.  Patient continues to have some soft blood pressure readings and elevated heart rates requiring IV metoprolol dosing.  Continues to be monitored in stepdown unit.  Assessment & Plan:   Principal Problem:   Abdominal pain Active Problems:   HTN (hypertension)   Dysphagia   Nausea and vomiting   Generalized weakness   Dehydration   Normocytic anemia   Leukocytosis   Hyperglycemia   Hypoalbuminemia due to protein-calorie malnutrition (HCC)   Prolonged QT interval   Achalasia   Malnutrition of moderate degree   Hypoactive bowel sounds   Dislodged gastrostomy tube   Epigastric/Abdominal pain, nausea and vomiting in the setting of esophageal dysphagia  Patient is ANA/SSA antibody positive. Sjogren's syndrome and SLE are on the differential. Patient also has had increased oral secretion, suggestive of sicca-related symptoms. Gastrointestinal involvement of SLE can  manifest as esophageal motility disorder, such as achalasia, which has been suggesting in this patient. Other GI manifestations of SLE include acute pancreatitis, for which  patient signs and symptoms are concerning, but CT 6/30 has shown no acute pancreatic findings. EGD in April 2022 showed unremarkable esophagus, and patient underwent dialtion given history of dysphagia, but patient states that did not help with symptoms.   -Continue IV morphine 0.5 mg q.4h p.r.n. for severe pain -Continue Protonix 40 mg po BID -Continue clear liquid diet with plan to advanced diet as tolerated -Gastroenterology following for further recommendations -ongoing goals of care discussions with palliative team, patient and daughter regarding tube feeding -Patient has undergone G-tube placement on 7/28 with dislodgment noted on 7/29 which will require replacement.   Hypokalemia/hypomagnesemia -Replete and reevaluate in a.m.   Generalized weakness and dehydration in the setting of above -Hypoalbuminemia secondary to mild protein calorie malnutrition -Continue IV hydration as described above -Continue protein supplement - surgery consult for PEG placement likely 7/29   Urinary tract infection - TREATED - UA in the ED showed many bacteria, moderate ketones, nitrites -Patient is on ceftriaxone 1g for coverage (completed on 7/23)  Normocytic anemia -Chronic and Stable  Leukocytosis/hyperglycemia possibly reactive - WBC normalized - Continue to monitor WBC and blood glucose with morning labs   Prolonged QTc (RESOLVED) - Avoid QT prolonging drugs - Magnesium 1.9 - Repeated EKG with resolution of prolonged QTc   COVID-19 infection (incidental finding) -Patient is asymptomatic and stable on room air -This was already diagnosed during last admission   Essential hypertension -Continue Lopressor but since can't take tabs now, we changed it to IV lopressor 2.5 mg Q6h until she can take the tablets  again  Hyperglycemia secondary to T2DM -Hemoglobin A1c on 6/30-5.8%   GERD -Continue Protonix     DVT prophylaxis:SCD Code Status: Full Family Communication: Daughter at bedside 7/28 Disposition Plan:  Status is: Inpatient   Remains inpatient appropriate because:Ongoing diagnostic testing needed not appropriate for outpatient work up and IV treatments appropriate due to intensity of illness or inability to take PO   Dispo: The patient is from: Home              Anticipated d/c is to: SNF              Patient currently is not medically stable to d/c.              Difficult to place patient No   Consultants:  Gastroenterology Palliative care Surgery (regarding PEG placement)   Procedures:  Barium swallow 7/20 EGD with botox injection 7/21 Open gastrostomy tube placement with lysis of adhesions and small bowel resection 7/28  Antimicrobials:  Anti-infectives (From admission, onward)    Start     Dose/Rate Route Frequency Ordered Stop   01/23/21 1300  cefoTEtan (CEFOTAN) 2 g in sodium chloride 0.9 % 100 mL IVPB        2 g 200 mL/hr over 30 Minutes Intravenous On call to O.R. 01/23/21 0959 01/23/21 1952   01/23/21 1157  sodium chloride 0.9 % with cefoTEtan (CEFOTAN) ADS Med       Note to Pharmacy: Devoria Glassing   : cabinet override      01/23/21 1157 01/23/21 1411   01/22/21 0600  ceFAZolin (ANCEF) IVPB 2g/100 mL premix  2 g 200 mL/hr over 30 Minutes Intravenous On call to O.R. 01/21/21 1747 01/22/21 0900   01/14/21 0830  cefTRIAXone (ROCEPHIN) 1 g in sodium chloride 0.9 % 100 mL IVPB        1 g 200 mL/hr over 30 Minutes Intravenous Every 24 hours 01/14/21 0735 01/16/21 1023   01/14/21 0800  fosfomycin (MONUROL) packet 3 g  Status:  Discontinued        3 g Oral  Once 01/14/21 3329 01/14/21 0734       Subjective: Patient seen and evaluated today with no new acute complaints or concerns. No acute concerns or events noted overnight.  She has continued to have  some soft blood pressures as well as tachycardia overnight requiring stepdown unit monitoring.  Objective: Vitals:   01/24/21 0500 01/24/21 0530 01/24/21 0736 01/24/21 1129  BP: 122/66 (!) 93/52    Pulse: (!) 119 (!) 105 (!) 111 (!) 108  Resp: (!) 27 18 20 20   Temp:   98.1 F (36.7 C) 98.4 F (36.9 C)  TempSrc:   Oral Oral  SpO2: 93% 97% 98% 100%  Weight:      Height:        Intake/Output Summary (Last 24 hours) at 01/24/2021 1336 Last data filed at 01/24/2021 0500 Gross per 24 hour  Intake 2229.89 ml  Output 60 ml  Net 2169.89 ml   Filed Weights   01/13/21 1909 01/23/21 1530 01/24/21 0400  Weight: 54.4 kg 49.7 kg 49.6 kg    Examination:  General exam: Appears calm and comfortable  Respiratory system: Clear to auscultation. Respiratory effort normal.  Nasal cannula. Cardiovascular system: S1 & S2 heard, RRR.  Gastrointestinal system: Abdomen is soft, binder in place with G-tube. Central nervous system: Alert and awake Extremities: No edema Skin: No significant lesions noted Psychiatry: Flat affect.    Data Reviewed: I have personally reviewed following labs and imaging studies  CBC: Recent Labs  Lab 01/18/21 0554 01/20/21 0413 01/23/21 0834 01/24/21 0335  WBC 9.2 10.2 11.5* 7.8  NEUTROABS  --  8.9*  --   --   HGB 10.5* 11.5* 9.8* 11.8*  HCT 32.2* 36.3 30.1* 37.7  MCV 98.8 101.4* 98.0 101.1*  PLT 147* 176 172 161   Basic Metabolic Panel: Recent Labs  Lab 01/18/21 0554 01/20/21 0413 01/23/21 0834 01/24/21 0335  NA 137 137 142 139  K 3.8 3.8 3.0* 3.9  CL 105 103 107 107  CO2 26 20* 26 26  GLUCOSE 72 71 127* 108*  BUN 10 14 23 20   CREATININE 0.42* 0.59  0.63 0.69 0.64  CALCIUM 8.7* 8.9 8.3* 8.2*  MG 1.7  --  1.6* 2.1  PHOS 2.5  --   --   --    GFR: Estimated Creatinine Clearance: 43.2 mL/min (by C-G formula based on SCr of 0.64 mg/dL). Liver Function Tests: Recent Labs  Lab 01/20/21 0413  AST 27  ALT 15  ALKPHOS 65  BILITOT 1.1  PROT  7.4  ALBUMIN 2.9*   Recent Labs  Lab 01/20/21 0413  LIPASE 118*   No results for input(s): AMMONIA in the last 168 hours. Coagulation Profile: No results for input(s): INR, PROTIME in the last 168 hours. Cardiac Enzymes: No results for input(s): CKTOTAL, CKMB, CKMBINDEX, TROPONINI in the last 168 hours. BNP (last 3 results) No results for input(s): PROBNP in the last 8760 hours. HbA1C: No results for input(s): HGBA1C in the last 72 hours. CBG: Recent Labs  Lab 01/22/21 1100  GLUCAP 110*   Lipid Profile: No results for input(s): CHOL, HDL, LDLCALC, TRIG, CHOLHDL, LDLDIRECT in the last 72 hours. Thyroid Function Tests: No results for input(s): TSH, T4TOTAL, FREET4, T3FREE, THYROIDAB in the last 72 hours. Anemia Panel: No results for input(s): VITAMINB12, FOLATE, FERRITIN, TIBC, IRON, RETICCTPCT in the last 72 hours. Sepsis Labs: No results for input(s): PROCALCITON, LATICACIDVEN in the last 168 hours.  Recent Results (from the past 240 hour(s))  Surgical pcr screen     Status: Abnormal   Collection Time: 01/22/21  8:16 AM   Specimen: Nasal Mucosa; Nasal Swab  Result Value Ref Range Status   MRSA, PCR POSITIVE (A) NEGATIVE Final    Comment: RESULT CALLED TO, READ BACK BY AND VERIFIED WITH: EMILY WRIGHT,RN @1152  01/22/2021 KAY    Staphylococcus aureus POSITIVE (A) NEGATIVE Final    Comment: RESULT CALLED TO, READ BACK BY AND VERIFIED WITH: EMILY WRIGHT,RN @1152  01/22/2021 KAY (NOTE) The Xpert SA Assay (FDA approved for NASAL specimens in patients 82 years of age and older), is one component of a comprehensive surveillance program. It is not intended to diagnose infection nor to guide or monitor treatment. Performed at [facility], [address] [place], [place] [address]          Radiology Studies: No results found.      Scheduled Meds:  Chlorhexidine Gluconate Cloth  6 each Topical Q0600   feeding supplement (VITAL HIGH PROTEIN)  1,000 mL Per Tube Q24H    metoprolol tartrate  2.5 mg Intravenous Q6H   mupirocin ointment  1 application Nasal BID   pantoprazole  40 mg Oral BID   scopolamine  1 patch Transdermal Q72H   Continuous Infusions:  lactated ringers 75 mL/hr at 01/24/21 0238     LOS: 11 days    Time spent: 35 minutes    Xaine Sansom Hoover Brunette Sidda Humm, DO [facility]  If 7PM-7AM, please contact night-coverage [email or web address] 01/24/2021, 1:36 PM

## 2021-01-24 NOTE — Progress Notes (Signed)
[facility]  Updated Kathryn [name] daughter at bedside. Can start to use the G tube for meds and trickle feeds. Ok to have clears to for comfort. Checking residuals for the feeds.  Binder and tube to be secured well at all times given dislodgement.   Roxicodone liquid ordered for pain.  BP (!) 93/52   Pulse (!) 108   Temp 98.4 F (36.9 C) (Oral)   Resp 20   Ht 5\' 4"  (1.626 m)   Wt 49.6 kg   SpO2 100%   BMI 18.77 kg/m  G tube in place and midline dressing c/d/I without erythema or drainage  , MD [facility] [address] [place], [place] [phone] (office)

## 2021-01-25 ENCOUNTER — Inpatient Hospital Stay (HOSPITAL_COMMUNITY): Payer: Medicare Other

## 2021-01-25 DIAGNOSIS — R1010 Upper abdominal pain, unspecified: Secondary | ICD-10-CM | POA: Diagnosis not present

## 2021-01-25 LAB — CBC
HCT: 30.5 % — ABNORMAL LOW (ref 36.0–46.0)
Hemoglobin: 9.6 g/dL — ABNORMAL LOW (ref 12.0–15.0)
MCH: 31.7 pg (ref 26.0–34.0)
MCHC: 31.5 g/dL (ref 30.0–36.0)
MCV: 100.7 fL — ABNORMAL HIGH (ref 80.0–100.0)
Platelets: 154 10*3/uL (ref 150–400)
RBC: 3.03 MIL/uL — ABNORMAL LOW (ref 3.87–5.11)
RDW: 14.9 % (ref 11.5–15.5)
WBC: 9.3 10*3/uL (ref 4.0–10.5)
nRBC: 0 % (ref 0.0–0.2)

## 2021-01-25 LAB — BASIC METABOLIC PANEL
Anion gap: 6 (ref 5–15)
BUN: 16 mg/dL (ref 8–23)
CO2: 28 mmol/L (ref 22–32)
Calcium: 8.3 mg/dL — ABNORMAL LOW (ref 8.9–10.3)
Chloride: 110 mmol/L (ref 98–111)
Creatinine, Ser: 0.47 mg/dL (ref 0.44–1.00)
GFR, Estimated: >60 mL/min (ref >60)
Glucose, Bld: 120 mg/dL — ABNORMAL HIGH (ref 70–99)
Potassium: 3.5 mmol/L (ref 3.5–5.1)
Sodium: 144 mmol/L (ref 135–145)

## 2021-01-25 LAB — GLUCOSE, CAPILLARY
Glucose-Capillary: 114 mg/dL — ABNORMAL HIGH (ref 70–99)
Glucose-Capillary: 119 mg/dL — ABNORMAL HIGH (ref 70–99)
Glucose-Capillary: 122 mg/dL — ABNORMAL HIGH (ref 70–99)
Glucose-Capillary: 122 mg/dL — ABNORMAL HIGH (ref 70–99)
Glucose-Capillary: 126 mg/dL — ABNORMAL HIGH (ref 70–99)
Glucose-Capillary: 133 mg/dL — ABNORMAL HIGH (ref 70–99)

## 2021-01-25 LAB — MAGNESIUM: Magnesium: 1.8 mg/dL (ref 1.7–2.4)

## 2021-01-25 NOTE — Progress Notes (Signed)
MD notified about Concern about aspiration of secretions. Patient has copious amount of thick frothy secretions and is unable to clear them due to weakness. Oral care and suction provided. Patient RR 30 and O2 sat 90-93%. Lungs are diminished. Surgeon contacted regading leaking around gtube and need to change dressing. Will continue to monitor.

## 2021-01-25 NOTE — Progress Notes (Signed)
Surgeon notified of CXR results. Ordered feed to be stopped. Feed stopped. Gtube clamped.

## 2021-01-25 NOTE — Progress Notes (Signed)
[facility]  TF running at 20 cc without high residuals. Having some abdominal pain. Abdomen soft, midline c/d/I with honeycomb. Would leave on 20 cc for now via the G tube.  Can start advancing tomorrow 8/1.   Would get nutrition to weigh in on goal and best formula for her.  Would adv as tolerated starting tomorrow 8/1 via the G tube and meds via the G tube.   If tolerates goal and meds via G tube then can continue this. Can trial a motility agent starting next week around 8/4 (1 week after the small bowel resection).   If does not tolerate G tube feeds. We have the plan for IR to exchange the G tube to a GJ. Dr. Marliss Coots is the contact at IR that I have spoken with if IR gives you any issues.   G tube is 20 french and is pexyed to the stomach. Dr. Elby Showers said likely could place GJ in 1 week, so around 8/5 (1 week from replacement) if needed.   I am out of town until 8/14. Dr. Lovell Sheehan is on call and can answer any questions.  Kathryn Greenhouse, MD [name] [address] Kathryn [name] Kath[name], [place] [address] [phone] (office)

## 2021-01-25 NOTE — Progress Notes (Signed)
PROGRESS NOTE    Kathryn [name]  WNU:[id number] DOB: 1941/02/15 DOA: 01/13/2021 PCP: Suzan Slick, MD   Brief Narrative:   Kathryn [name] is a 80 y.o. female with medical history significant for  hypertension, type 2 diabetes mellitus, GERD, positive ANA and SSA autoantibodies, and chronic achalasia (2014 manometry showed abnormal motility)  who presents to the emergency department due to more than 2 months history of decreased oral intake, generalized weakness and about 3 to 4-week onset of abdominal pain associated with difficulty in being able to swallow (solid and liquid).  She believes that she must have lost up to 30 pounds since onset of symptoms.  Patient endorsed that she usually regurgitates any food/liquid ingested within 15 to 20 minutes of ingestion.  Abdominal pain was in the epigastric area and was sharp in nature, this worsens with food.  She denies fever, chills, chest pain, shortness of breath.   She was admitted to the hospital from 12/25/2020-12/28/2020, at that time barium p.o. esophagram done urgently as an outpatient was indicated.  She was also admitted from 10/23/2020 to 10/25/2020 during which patient presented with proximal muscle weakness with an elevated CPK of 1206 that was concerning for polymyalgia rheumatica. This was treated with prednisone with improvement.  EGD done on 10/04/2020 showed normal dilated esophagus and small hiatal hernia.   In the emergency department, she was intermittently tachypneic and tachycardic.  Other vital signs were within normal range.  Work-up in the ED showed normocytic anemia and leukocytosis, BMP was normal except for elevated BUN at and hyperglycemia with CBG of 135.  Albumin 3.2. CT of head without contrast showed no acute intracranial abnormality. IV hydration was provided, hospitalist was asked to admit patient for further evaluation and management.   7/20 - Patient still experiences significant epigastric pain. Using suction for  oral secretion. No other acute complaints. GI (Dr. Karilyn Cota) was consulted and will provide further recommendations. Of note, patient has not been able to follow up with outpatient rheumatology and has not been receiving treatments    7/21 - Patient is clinically stable. EGD with botox treatment was successfully completed.   7/22 - Patient continues to have difficulty swallowing.  Began discussion with patient regarding possible need for temporary PEG placement if needed.   7/24-7/25 - pt making trials of liquids but only tolerating small amounts of liquids.     7/26 -7/27- recurrent abdominal pain, unable to take any liquids today.  Still suctioning frequently.  Agreeable to PEG consultation and placement. Will start IVF since intake is minimal.   7/28-patient has undergone open gastrostomy tube placement and has tube to gravity with plans to keep n.p.o. until 7/29.   7/29-patient has had dislodgment of G-tube with need for replacement per general surgery today.   7/30-CLD and trickle feeds initiated per General Surgery.  Patient continues to have some soft blood pressure readings and elevated heart rates requiring IV metoprolol dosing.  Continues to be monitored in stepdown unit.  7/31-tube feeds will be advanced per dietary discretion starting 8/1.  Okay to transfer to telemetry today if heart rate remains stable.  Assessment & Plan:   Principal Problem:   Abdominal pain Active Problems:   HTN (hypertension)   Dysphagia   Nausea and vomiting   Generalized weakness   Dehydration   Normocytic anemia   Leukocytosis   Hyperglycemia   Hypoalbuminemia due to protein-calorie malnutrition (HCC)   Prolonged QT interval   Achalasia   Malnutrition of moderate  degree   Hypoactive bowel sounds   Dislodged gastrostomy tube   Epigastric/Abdominal pain, nausea and vomiting in the setting of esophageal dysphagia Patient is ANA/SSA antibody positive. Sjogren's syndrome and SLE are on the  differential. Patient also has had increased oral secretion, suggestive of sicca-related symptoms. Gastrointestinal involvement of SLE can manifest as esophageal motility disorder, such as achalasia, which has been suggesting in this patient. Other GI manifestations of SLE include acute pancreatitis, for which  patient signs and symptoms are concerning, but CT 6/30 has shown no acute pancreatic findings. EGD in April 2022 showed unremarkable esophagus, and patient underwent dialtion given history of dysphagia, but patient states that did not help with symptoms.   -Continue IV morphine 0.5 mg q.4h p.r.n. for severe pain -Continue Protonix 40 mg po BID -Continue clear liquid diet with plan to advanced diet as tolerated -Gastroenterology following for further recommendations -ongoing goals of care discussions with palliative team, patient and daughter regarding tube feeding -Patient has undergone G-tube placement on 7/28 with dislodgment noted on 7/29 which has undergone replacement -She is now tolerating trickle feeds which will be advanced further on 8/1.   Generalized weakness and dehydration in the setting of above -Hypoalbuminemia secondary to mild protein calorie malnutrition -Continue IV hydration as described above -Continue protein supplement - surgery consult for PEG placement likely 7/29   Urinary tract infection - TREATED - UA in the ED showed many bacteria, moderate ketones, nitrites -Patient is on ceftriaxone 1g for coverage (completed on 7/23)  Normocytic anemia -Chronic and Stable   COVID-19 infection (incidental finding) -Patient is asymptomatic and stable on room air -This was already diagnosed during last admission   Essential hypertension -Continue Lopressor but since can't take tabs now, we changed it to IV lopressor 2.5 mg Q6h until she can take the tablets again  Hyperglycemia secondary to T2DM -Hemoglobin A1c on 6/30-5.8%   GERD -Continue Protonix     DVT  prophylaxis:SCD Code Status: Full Family Communication: Daughter at bedside 7/28 Disposition Plan:  Status is: Inpatient   Remains inpatient appropriate because:Ongoing diagnostic testing needed not appropriate for outpatient work up and IV treatments appropriate due to intensity of illness or inability to take PO   Dispo: The patient is from: Home              Anticipated d/c is to: SNF              Patient currently is not medically stable to d/c.              Difficult to place patient No   Consultants:  Gastroenterology Palliative care Surgery (regarding PEG placement)   Procedures:  Barium swallow 7/20 EGD with botox injection 7/21 Open gastrostomy tube placement with lysis of adhesions and small bowel resection 7/28  Antimicrobials:  Anti-infectives (From admission, onward)    Start     Dose/Rate Route Frequency Ordered Stop   01/23/21 1300  cefoTEtan (CEFOTAN) 2 g in sodium chloride 0.9 % 100 mL IVPB        2 g 200 mL/hr over 30 Minutes Intravenous On call to O.R. 01/23/21 0959 01/23/21 1952   01/23/21 1157  sodium chloride 0.9 % with cefoTEtan (CEFOTAN) ADS Med       Note to Pharmacy: Devoria Glassing   : cabinet override      01/23/21 1157 01/23/21 1411   01/22/21 0600  ceFAZolin (ANCEF) IVPB 2g/100 mL premix        2 g 200  mL/hr over 30 Minutes Intravenous On call to O.R. 01/21/21 1747 01/22/21 0900   01/14/21 0830  cefTRIAXone (ROCEPHIN) 1 g in sodium chloride 0.9 % 100 mL IVPB        1 g 200 mL/hr over 30 Minutes Intravenous Every 24 hours 01/14/21 0735 01/16/21 1023   01/14/21 0800  fosfomycin (MONUROL) packet 3 g  Status:  Discontinued        3 g Oral  Once 01/14/21 [id number] 01/14/21 0734      Subjective: Patient seen and evaluated today with no new acute complaints or concerns. No acute concerns or events noted overnight.  She appears to be tolerating trickle feeds.  Heart rate is stabilizing and patient may transfer to telemetry.  Patient was transition back to  n.p.o. last night after choking on some clear liquids.  Objective: Vitals:   01/25/21 1000 01/25/21 1100 01/25/21 1126 01/25/21 1200  BP: (!) 141/71 123/61  113/60  Pulse: (!) 103   91  Resp: (!) 24 19 (!) 28 (!) 21  Temp:   (!) 97.4 F (36.3 C)   TempSrc:   Oral   SpO2: 100%   100%  Weight:      Height:        Intake/Output Summary (Last 24 hours) at 01/25/2021 1229 Last data filed at 01/25/2021 1200 Gross per 24 hour  Intake 2250.17 ml  Output 2230 ml  Net 20.17 ml   Filed Weights   01/23/21 1530 01/24/21 0400 01/25/21 0312  Weight: 49.7 kg 49.6 kg 50 kg    Examination:  General exam: Appears calm and comfortable  Respiratory system: Clear to auscultation. Respiratory effort normal.  Currently on nasal cannula oxygen Cardiovascular system: S1 & S2 heard, RRR.  Mildly tachycardic Gastrointestinal system: Abdomen is soft, binder in place with G-tube clean dry and intact. Central nervous system: Alert and awake Extremities: No edema Skin: No significant lesions noted Psychiatry: Flat affect.    Data Reviewed: I have personally reviewed following labs and imaging studies  CBC: Recent Labs  Lab 01/20/21 0413 01/23/21 0834 01/24/21 0335 01/25/21 0434  WBC 10.2 11.5* 7.8 9.3  NEUTROABS 8.9*  --   --   --   HGB 11.5* 9.8* 11.8* 9.6*  HCT 36.3 30.1* 37.7 30.5*  MCV 101.4* 98.0 101.1* 100.7*  PLT 176 172 161 154   Basic Metabolic Panel: Recent Labs  Lab 01/20/21 0413 01/23/21 0834 01/24/21 0335 01/25/21 0434  NA 137 142 139 144  K 3.8 3.0* 3.9 3.5  CL 103 107 107 110  CO2 20* 26 26 28   GLUCOSE 71 127* 108* 120*  BUN 14 23 20 16   CREATININE 0.59  0.63 0.69 0.64 0.47  CALCIUM 8.9 8.3* 8.2* 8.3*  MG  --  1.6* 2.1 1.8   GFR: Estimated Creatinine Clearance: 43.5 mL/min (by C-G formula based on SCr of 0.47 mg/dL). Liver Function Tests: Recent Labs  Lab 01/20/21 0413  AST 27  ALT 15  ALKPHOS 65  BILITOT 1.1  PROT 7.4  ALBUMIN 2.9*   Recent Labs   Lab 01/20/21 0413  LIPASE 118*   No results for input(s): AMMONIA in the last 168 hours. Coagulation Profile: No results for input(s): INR, PROTIME in the last 168 hours. Cardiac Enzymes: No results for input(s): CKTOTAL, CKMB, CKMBINDEX, TROPONINI in the last 168 hours. BNP (last 3 results) No results for input(s): PROBNP in the last 8760 hours. HbA1C: No results for input(s): HGBA1C in the last 72 hours. CBG: Recent  Labs  Lab 01/24/21 1939 01/24/21 2333 01/25/21 0324 01/25/21 0732 01/25/21 1124  GLUCAP 84 98 114* 122* 122*   Lipid Profile: No results for input(s): CHOL, HDL, LDLCALC, TRIG, CHOLHDL, LDLDIRECT in the last 72 hours. Thyroid Function Tests: No results for input(s): TSH, T4TOTAL, FREET4, T3FREE, THYROIDAB in the last 72 hours. Anemia Panel: No results for input(s): VITAMINB12, FOLATE, FERRITIN, TIBC, IRON, RETICCTPCT in the last 72 hours. Sepsis Labs: No results for input(s): PROCALCITON, LATICACIDVEN in the last 168 hours.  Recent Results (from the past 240 hour(s))  Surgical pcr screen     Status: Abnormal   Collection Time: 01/22/21  8:16 AM   Specimen: Nasal Mucosa; Nasal Swab  Result Value Ref Range Status   MRSA, PCR POSITIVE (A) NEGATIVE Final    Comment: RESULT CALLED TO, READ BACK BY AND VERIFIED WITH: EMILY WRIGHT,RN @1152  01/22/2021 KAY    Staphylococcus aureus POSITIVE (A) NEGATIVE Final    Comment: RESULT CALLED TO, READ BACK BY AND VERIFIED WITH: EMILY WRIGHT,RN @1152  01/22/2021 KAY (NOTE) The Xpert SA Assay (FDA approved for NASAL specimens in patients 61 years of age and older), is one component of a comprehensive surveillance program. It is not intended to diagnose infection nor to guide or monitor treatment. Performed at [facility], [address], [place], [address] [place]          Radiology Studies: No results found.      Scheduled Meds:  Chlorhexidine Gluconate Cloth  6 each Topical Q0600   feeding supplement  (OSMOLITE 1.2 CAL)  1,000 mL Per Tube Q24H   free water  30 mL Per Tube Q4H   metoprolol tartrate  2.5 mg Intravenous Q6H   mupirocin ointment  1 application Nasal BID   pantoprazole sodium  40 mg Per Tube BID   scopolamine  1 patch Transdermal Q72H   Continuous Infusions:  lactated ringers 50 mL/hr at 01/25/21 1225     LOS: 12 days    Time spent: 35 minutes    Yaileen Hofferber [id number], DO [facility]  If 7PM-7AM, please contact night-coverage [email or web address] 01/25/2021, 12:29 PM

## 2021-01-26 ENCOUNTER — Encounter (HOSPITAL_COMMUNITY): Payer: Self-pay | Admitting: General Surgery

## 2021-01-26 ENCOUNTER — Inpatient Hospital Stay: Payer: Self-pay

## 2021-01-26 ENCOUNTER — Inpatient Hospital Stay (HOSPITAL_COMMUNITY): Payer: Medicare Other

## 2021-01-26 ENCOUNTER — Ambulatory Visit: Payer: Medicare Other | Admitting: Internal Medicine

## 2021-01-26 DIAGNOSIS — Z515 Encounter for palliative care: Secondary | ICD-10-CM | POA: Diagnosis not present

## 2021-01-26 DIAGNOSIS — Z7189 Other specified counseling: Secondary | ICD-10-CM | POA: Diagnosis not present

## 2021-01-26 DIAGNOSIS — R1010 Upper abdominal pain, unspecified: Secondary | ICD-10-CM | POA: Diagnosis not present

## 2021-01-26 DIAGNOSIS — K220 Achalasia of cardia: Secondary | ICD-10-CM | POA: Diagnosis not present

## 2021-01-26 LAB — GLUCOSE, CAPILLARY
Glucose-Capillary: 106 mg/dL — ABNORMAL HIGH (ref 70–99)
Glucose-Capillary: 114 mg/dL — ABNORMAL HIGH (ref 70–99)
Glucose-Capillary: 116 mg/dL — ABNORMAL HIGH (ref 70–99)
Glucose-Capillary: 141 mg/dL — ABNORMAL HIGH (ref 70–99)
Glucose-Capillary: 99 mg/dL (ref 70–99)

## 2021-01-26 LAB — BASIC METABOLIC PANEL
Anion gap: 5 (ref 5–15)
BUN: 12 mg/dL (ref 8–23)
CO2: 29 mmol/L (ref 22–32)
Calcium: 8.2 mg/dL — ABNORMAL LOW (ref 8.9–10.3)
Chloride: 109 mmol/L (ref 98–111)
Creatinine, Ser: 0.44 mg/dL (ref 0.44–1.00)
GFR, Estimated: >60 mL/min (ref >60)
Glucose, Bld: 120 mg/dL — ABNORMAL HIGH (ref 70–99)
Potassium: 3.4 mmol/L — ABNORMAL LOW (ref 3.5–5.1)
Sodium: 143 mmol/L (ref 135–145)

## 2021-01-26 LAB — CBC
HCT: 29.6 % — ABNORMAL LOW (ref 36.0–46.0)
Hemoglobin: 9.5 g/dL — ABNORMAL LOW (ref 12.0–15.0)
MCH: 32.2 pg (ref 26.0–34.0)
MCHC: 32.1 g/dL (ref 30.0–36.0)
MCV: 100.3 fL — ABNORMAL HIGH (ref 80.0–100.0)
Platelets: 164 10*3/uL (ref 150–400)
RBC: 2.95 MIL/uL — ABNORMAL LOW (ref 3.87–5.11)
RDW: 15.1 % (ref 11.5–15.5)
WBC: 8.1 10*3/uL (ref 4.0–10.5)
nRBC: 0 % (ref 0.0–0.2)

## 2021-01-26 LAB — PROCALCITONIN: Procalcitonin: 0.93 ng/mL

## 2021-01-26 LAB — MAGNESIUM: Magnesium: 1.8 mg/dL (ref 1.7–2.4)

## 2021-01-26 MED ORDER — POTASSIUM CHLORIDE 2 MEQ/ML IV SOLN
INTRAVENOUS | Status: AC
  Filled 2021-01-26: qty 1000

## 2021-01-26 MED ORDER — SODIUM CHLORIDE 0.9 % IV SOLN
100.0000 mg | Freq: Two times a day (BID) | INTRAVENOUS | Status: DC
  Administered 2021-01-26: 100 mg via INTRAVENOUS
  Filled 2021-01-26 (×3): qty 100

## 2021-01-26 MED ORDER — SODIUM CHLORIDE 0.9% FLUSH
10.0000 mL | INTRAVENOUS | Status: DC | PRN
  Administered 2021-02-02: 10 mL

## 2021-01-26 MED ORDER — SODIUM CHLORIDE 0.9% FLUSH
10.0000 mL | Freq: Two times a day (BID) | INTRAVENOUS | Status: DC
  Administered 2021-01-27: 10 mL
  Administered 2021-01-27: 20 mL
  Administered 2021-01-29 – 2021-02-06 (×14): 10 mL

## 2021-01-26 MED ORDER — SODIUM CHLORIDE 0.9 % IV SOLN
3.0000 g | Freq: Four times a day (QID) | INTRAVENOUS | Status: AC
  Administered 2021-01-26 – 2021-02-02 (×31): 3 g via INTRAVENOUS
  Filled 2021-01-26 (×2): qty 8
  Filled 2021-01-26: qty 3
  Filled 2021-01-26 (×5): qty 8
  Filled 2021-01-26: qty 3
  Filled 2021-01-26 (×4): qty 8
  Filled 2021-01-26 (×2): qty 3
  Filled 2021-01-26 (×9): qty 8
  Filled 2021-01-26: qty 3
  Filled 2021-01-26: qty 8
  Filled 2021-01-26: qty 3
  Filled 2021-01-26 (×7): qty 8
  Filled 2021-01-26: qty 3
  Filled 2021-01-26 (×2): qty 8

## 2021-01-26 MED ORDER — FAT EMUL FISH OIL/PLANT BASED 20% (SMOFLIPID)IV EMUL
INTRAVENOUS | Status: AC
  Filled 2021-01-26: qty 384

## 2021-01-26 MED ORDER — FLEET ENEMA 7-19 GM/118ML RE ENEM
1.0000 | ENEMA | Freq: Once | RECTAL | Status: AC
  Administered 2021-01-26: 1 via RECTAL

## 2021-01-26 MED ORDER — ORAL CARE MOUTH RINSE
15.0000 mL | Freq: Two times a day (BID) | OROMUCOSAL | Status: DC
  Administered 2021-01-26 – 2021-02-05 (×17): 15 mL via OROMUCOSAL

## 2021-01-26 MED ORDER — INSULIN ASPART 100 UNIT/ML IJ SOLN
0.0000 [IU] | Freq: Three times a day (TID) | INTRAMUSCULAR | Status: DC
  Administered 2021-01-27: 2 [IU] via SUBCUTANEOUS
  Administered 2021-01-27: 1 [IU] via SUBCUTANEOUS
  Administered 2021-01-27: 2 [IU] via SUBCUTANEOUS

## 2021-01-26 MED ORDER — CHLORHEXIDINE GLUCONATE 0.12 % MT SOLN
15.0000 mL | Freq: Two times a day (BID) | OROMUCOSAL | Status: DC
  Administered 2021-01-26 – 2021-02-06 (×20): 15 mL via OROMUCOSAL
  Filled 2021-01-26 (×22): qty 15

## 2021-01-26 NOTE — Progress Notes (Signed)
PROGRESS NOTE    Kathryn [name]  MWU:[id number] DOB: Jan 17, 1941 DOA: 01/13/2021 PCP: Suzan Slick, MD   Brief Narrative:   Kathryn [name] is a 80 y.o. female with medical history significant for  hypertension, type 2 diabetes mellitus, GERD, positive ANA and SSA autoantibodies, and chronic achalasia (2014 manometry showed abnormal motility)  who presents to the emergency department due to more than 2 months history of decreased oral intake, generalized weakness and about 3 to 4-week onset of abdominal pain associated with difficulty in being able to swallow (solid and liquid).  She believes that she must have lost up to 30 pounds since onset of symptoms.  Patient endorsed that she usually regurgitates any food/liquid ingested within 15 to 20 minutes of ingestion.  Abdominal pain was in the epigastric area and was sharp in nature, this worsens with food.  She denies fever, chills, chest pain, shortness of breath.   She was admitted to the hospital from 12/25/2020-12/28/2020, at that time barium p.o. esophagram done urgently as an outpatient was indicated.  She was also admitted from 10/23/2020 to 10/25/2020 during which patient presented with proximal muscle weakness with an elevated CPK of 1206 that was concerning for polymyalgia rheumatica. This was treated with prednisone with improvement.  EGD done on 10/04/2020 showed normal dilated esophagus and small hiatal hernia.   In the emergency department, she was intermittently tachypneic and tachycardic.  Other vital signs were within normal range.  Work-up in the ED showed normocytic anemia and leukocytosis, BMP was normal except for elevated BUN at and hyperglycemia with CBG of 135.  Albumin 3.2. CT of head without contrast showed no acute intracranial abnormality. IV hydration was provided, hospitalist was asked to admit patient for further evaluation and management.   7/20 - Patient still experiences significant epigastric pain. Using suction for  oral secretion. No other acute complaints. GI (Dr. Karilyn Cota) was consulted and will provide further recommendations. Of note, patient has not been able to follow up with outpatient rheumatology and has not been receiving treatments    7/21 - Patient is clinically stable. EGD with botox treatment was successfully completed.   7/22 - Patient continues to have difficulty swallowing.  Began discussion with patient regarding possible need for temporary PEG placement if needed.   7/24-7/25 - pt making trials of liquids but only tolerating small amounts of liquids.     7/26 -7/27- recurrent abdominal pain, unable to take any liquids today.  Still suctioning frequently.  Agreeable to PEG consultation and placement. Will start IVF since intake is minimal.   7/28-patient has undergone open gastrostomy tube placement and has tube to gravity with plans to keep n.p.o. until 7/29.   7/29-patient has had dislodgment of G-tube with need for replacement per general surgery today.   7/30-CLD and trickle feeds initiated per General Surgery.  Patient continues to have some soft blood pressure readings and elevated heart rates requiring IV metoprolol dosing.  Continues to be monitored in stepdown unit.   7/31-tube feeds will be advanced per dietary discretion starting 8/1.    8/1-patient had aspirated overnight and tube feeds were discontinued.  She is now on IV Unasyn for aspiration coverage.  Plan for PICC line placement and TPN and replacement of G-tube to GJ by 8/5.  Assessment & Plan:   Principal Problem:   Abdominal pain Active Problems:   HTN (hypertension)   Dysphagia   Nausea and vomiting   Generalized weakness   Dehydration   Normocytic anemia  Leukocytosis   Hyperglycemia   Hypoalbuminemia due to protein-calorie malnutrition (HCC)   Prolonged QT interval   Achalasia   Malnutrition of moderate degree   Hypoactive bowel sounds   Dislodged gastrostomy tube   Epigastric/Abdominal pain,  nausea and vomiting in the setting of esophageal dysphagia Patient is ANA/SSA antibody positive. Sjogren's syndrome and SLE are on the differential. Patient also has had increased oral secretion, suggestive of sicca-related symptoms. Gastrointestinal involvement of SLE can manifest as esophageal motility disorder, such as achalasia, which has been suggesting in this patient. Other GI manifestations of SLE include acute pancreatitis, for which  patient signs and symptoms are concerning, but CT 6/30 has shown no acute pancreatic findings. EGD in April 2022 showed unremarkable esophagus, and patient underwent dialtion given history of dysphagia, but patient states that did not help with symptoms.   -Continue IV morphine 0.5 mg q.4h p.r.n. for severe pain -Continue Protonix 40 mg po BID -Continue clear liquid diet with plan to advanced diet as tolerated -Gastroenterology following for further recommendations -ongoing goals of care discussions with palliative team, patient and daughter regarding tube feeding -Patient has undergone G-tube placement on 7/28 with dislodgment noted on 7/29 which has undergone replacement -She unfortunately developed aspiration pneumonia overnight and is on Unasyn now for coverage.  Tube feeds have been discontinued.  Plan for PICC line placement and TPN initiation  Aspiration pneumonia -Cover with Unasyn and monitor   Generalized weakness and dehydration in the setting of above -Hypoalbuminemia secondary to mild protein calorie malnutrition -Continue IV hydration as described above -Continue protein supplement - surgery consult for PEG placement likely 7/29   Urinary tract infection - TREATED - UA in the ED showed many bacteria, moderate ketones, nitrites -Patient is on ceftriaxone 1g for coverage (completed on 7/23)  Normocytic anemia -Chronic and Stable   COVID-19 infection (incidental finding) -Patient is asymptomatic and stable on room air -This was already  diagnosed during last admission   Essential hypertension -Continue Lopressor but since can't take tabs now, we changed it to IV lopressor 2.5 mg Q6h until she can take the tablets again  Hyperglycemia secondary to T2DM -Hemoglobin A1c on 6/30-5.8%   GERD -Continue Protonix     DVT prophylaxis:SCD Code Status: Full Family Communication: Daughter on phone 8/1, sister-in-law [name] on phone Disposition Plan:  Status is: Inpatient   Remains inpatient appropriate because:Ongoing diagnostic testing needed not appropriate for outpatient work up and IV treatments appropriate due to intensity of illness or inability to take PO   Dispo: The patient is from: Home              Anticipated d/c is to: SNF              Patient currently is not medically stable to d/c.              Difficult to place patient No   Consultants:  Gastroenterology Palliative care Surgery (regarding PEG placement) IR   Procedures:  Barium swallow 7/20 EGD with botox injection 7/21 Open gastrostomy tube placement with lysis of adhesions and small bowel resection 7/28  Antimicrobials:  Anti-infectives (From admission, onward)    Start     Dose/Rate Route Frequency Ordered Stop   01/26/21 0100  Ampicillin-Sulbactam (UNASYN) 3 g in sodium chloride 0.9 % 100 mL IVPB        3 g 200 mL/hr over 30 Minutes Intravenous Every 6 hours 01/26/21 0009     01/26/21 0100  doxycycline (VIBRAMYCIN) 100  mg in sodium chloride 0.9 % 250 mL IVPB  Status:  Discontinued        100 mg 125 mL/hr over 120 Minutes Intravenous Every 12 hours 01/26/21 0009 01/26/21 0745   01/23/21 1300  cefoTEtan (CEFOTAN) 2 g in sodium chloride 0.9 % 100 mL IVPB        2 g 200 mL/hr over 30 Minutes Intravenous On call to O.R. 01/23/21 0959 01/23/21 1952   01/23/21 1157  sodium chloride 0.9 % with cefoTEtan (CEFOTAN) ADS Med       Note to Pharmacy: Devoria GlassingMoore, Kimberly   : cabinet override      01/23/21 1157 01/23/21 1411   01/22/21 0600  ceFAZolin  (ANCEF) IVPB 2g/100 mL premix        2 g 200 mL/hr over 30 Minutes Intravenous On call to O.R. 01/21/21 1747 01/22/21 0900   01/14/21 0830  cefTRIAXone (ROCEPHIN) 1 g in sodium chloride 0.9 % 100 mL IVPB        1 g 200 mL/hr over 30 Minutes Intravenous Every 24 hours 01/14/21 0735 01/16/21 1023   01/14/21 0800  fosfomycin (MONUROL) packet 3 g  Status:  Discontinued        3 g Oral  Once 01/14/21 [id number] 01/14/21 0734       Subjective: Patient seen and evaluated today with noted aspiration overnight for which she was started on Unasyn.  Tube feeds have now been discontinued.  Objective: Vitals:   01/26/21 0600 01/26/21 0700 01/26/21 0800 01/26/21 0806  BP: (!) 144/58 (!) 144/59 (!) 137/59   Pulse: (!) 106 97 97   Resp: 20 13 16    Temp:    98 F (36.7 C)  TempSrc:    Axillary  SpO2: 92% 96% 96%   Weight:      Height:        Intake/Output Summary (Last 24 hours) at 01/26/2021 1053 Last data filed at 01/26/2021 0830 Gross per 24 hour  Intake 2345.63 ml  Output 2030 ml  Net 315.63 ml   Filed Weights   01/24/21 0400 01/25/21 0312 01/26/21 0300  Weight: 49.6 kg 50 kg 52.3 kg    Examination:  General exam: Appears calm and comfortable  Respiratory system: Clear to auscultation. Respiratory effort normal.  Currently on nasal cannula Cardiovascular system: S1 & S2 heard, RRR.  Gastrointestinal system: Abdomen is soft, G-tube present Central nervous system: Alert and awake Extremities: No edema Skin: No significant lesions noted Psychiatry: Flat affect.    Data Reviewed: I have personally reviewed following labs and imaging studies  CBC: Recent Labs  Lab 01/20/21 0413 01/23/21 0834 01/24/21 0335 01/25/21 0434 01/26/21 0432  WBC 10.2 11.5* 7.8 9.3 8.1  NEUTROABS 8.9*  --   --   --   --   HGB 11.5* 9.8* 11.8* 9.6* 9.5*  HCT 36.3 30.1* 37.7 30.5* 29.6*  MCV 101.4* 98.0 101.1* 100.7* 100.3*  PLT 176 172 161 154 164   Basic Metabolic Panel: Recent Labs  Lab  01/20/21 0413 01/23/21 0834 01/24/21 0335 01/25/21 0434 01/26/21 0432  NA 137 142 139 144 143  K 3.8 3.0* 3.9 3.5 3.4*  CL 103 107 107 110 109  CO2 20* 26 26 28 29   GLUCOSE 71 127* 108* 120* 120*  BUN 14 23 20 16 12   CREATININE 0.59  0.63 0.69 0.64 0.47 0.44  CALCIUM 8.9 8.3* 8.2* 8.3* 8.2*  MG  --  1.6* 2.1 1.8 1.8   GFR: Estimated Creatinine Clearance: 45.5  mL/min (by C-G formula based on SCr of 0.44 mg/dL). Liver Function Tests: Recent Labs  Lab 01/20/21 0413  AST 27  ALT 15  ALKPHOS 65  BILITOT 1.1  PROT 7.4  ALBUMIN 2.9*   Recent Labs  Lab 01/20/21 0413  LIPASE 118*   No results for input(s): AMMONIA in the last 168 hours. Coagulation Profile: No results for input(s): INR, PROTIME in the last 168 hours. Cardiac Enzymes: No results for input(s): CKTOTAL, CKMB, CKMBINDEX, TROPONINI in the last 168 hours. BNP (last 3 results) No results for input(s): PROBNP in the last 8760 hours. HbA1C: No results for input(s): HGBA1C in the last 72 hours. CBG: Recent Labs  Lab 01/25/21 1649 01/25/21 2049 01/25/21 2335 01/26/21 0309 01/26/21 0806  GLUCAP 119* 126* 133* 116* 114*   Lipid Profile: No results for input(s): CHOL, HDL, LDLCALC, TRIG, CHOLHDL, LDLDIRECT in the last 72 hours. Thyroid Function Tests: No results for input(s): TSH, T4TOTAL, FREET4, T3FREE, THYROIDAB in the last 72 hours. Anemia Panel: No results for input(s): VITAMINB12, FOLATE, FERRITIN, TIBC, IRON, RETICCTPCT in the last 72 hours. Sepsis Labs: Recent Labs  Lab 01/26/21 0432  PROCALCITON 0.93    Recent Results (from the past 240 hour(s))  Surgical pcr screen     Status: Abnormal   Collection Time: 01/22/21  8:16 AM   Specimen: Nasal Mucosa; Nasal Swab  Result Value Ref Range Status   MRSA, PCR POSITIVE (A) NEGATIVE Final    Comment: RESULT CALLED TO, READ BACK BY AND VERIFIED WITH: EMILY WRIGHT,RN @1152  01/22/2021 KAY    Staphylococcus aureus POSITIVE (A) NEGATIVE Final     Comment: RESULT CALLED TO, READ BACK BY AND VERIFIED WITH: EMILY WRIGHT,RN @1152  01/22/2021 KAY (NOTE) The Xpert SA Assay (FDA approved for NASAL specimens in patients 36 years of age and older), is one component of a comprehensive surveillance program. It is not intended to diagnose infection nor to guide or monitor treatment. Performed at [facility], [address] [place], [address] [place]          Radiology Studies: DG Chest 1 View  Result Date: 01/25/2021 CLINICAL DATA:  Increased shortness of breath. EXAM: CHEST  1 VIEW COMPARISON:  Radiograph 01/12/2019 FINDINGS: Lower lung volumes from prior exam. Normal heart size with stable mediastinal contours. Development of patchy airspace disease in the right perihilar and right lung base. Minor left lung base atelectasis. Chronic elevation of left hemidiaphragm, accentuated by lower lung volumes. There may be a small right pleural effusion. No pneumothorax. There is mild residual barium in the upper abdomen from prior esophagram. IMPRESSION: Development of patchy airspace disease in the right perihilar and right lung base suspicious for pneumonia, aspiration is considered. Possible small right pleural effusion. Electronically Signed   By: 01/27/2021 M.D.   On: 01/25/2021 22:02   Narda Rutherford EKG SITE RITE  Result Date: 01/26/2021 If Site Rite image not attached, placement could not be confirmed due to current cardiac rhythm.       Scheduled Meds:  chlorhexidine  15 mL Mouth Rinse BID   Chlorhexidine Gluconate Cloth  6 each Topical Q0600   feeding supplement (OSMOLITE 1.2 CAL)  1,000 mL Per Tube Q24H   free water  30 mL Per Tube Q4H   mouth rinse  15 mL Mouth Rinse q12n4p   metoprolol tartrate  2.5 mg Intravenous Q6H   mupirocin ointment  1 application Nasal BID   pantoprazole sodium  40 mg Per Tube BID   scopolamine  1  patch Transdermal Q72H   Continuous Infusions:  ampicillin-sulbactam (UNASYN) IV Stopped (01/26/21 0636)    lactated ringers with kcl       LOS: 13 days    Time spent: 35 minutes    Sahasra Belue Hoover Brunette, DO [facility]  If 7PM-7AM, please contact night-coverage [email or web address] 01/26/2021, 10:53 AM

## 2021-01-26 NOTE — Progress Notes (Signed)
PT Cancellation Note  Patient Details Name: Kathryn [name] MRN: [id number] DOB: 04-16-1941   Cancelled Treatment:    Reason Eval/Treat Not Completed: Medical issues which prohibited therapy.  Patient transferred to a higher level of care and will need new PT consult resume therapy when patient is medically stable.  Thank you.   7:40 AM, 01/26/21 Ocie Bob, MPT Physical Therapist with [facility] [phone] office [phone] mobile phone

## 2021-01-26 NOTE — Progress Notes (Signed)
Peripherally Inserted Central Catheter Placement  The IV Nurse has discussed with the patient and/or persons authorized to consent for the patient, the purpose of this procedure and the potential benefits and risks involved with this procedure.  The benefits include less needle sticks, lab draws from the catheter, and the patient may be discharged home with the catheter. Risks include, but not limited to, infection, bleeding, blood clot (thrombus formation), and puncture of an artery; nerve damage and irregular heartbeat and possibility to perform a PICC exchange if needed/ordered by physician.  Alternatives to this procedure were also discussed.  Bard Power PICC patient education guide, fact sheet on infection prevention and patient information card has been provided to patient /or left at bedside. Consent obtained from daughter [name]) at bedside with second verify by Florentina Addison, RN.   PICC Placement Documentation  PICC Double Lumen 01/26/21 PICC Right Brachial 33 cm 0 cm (Active)  Indication for Insertion or Continuance of Line Administration of hyperosmolar/irritating solutions (i.e. TPN, Vancomycin, etc.) 01/26/21 1639  Exposed Catheter (cm) 0 cm 01/26/21 1639  Site Assessment Clean;Dry;Intact 01/26/21 1639  Lumen #1 Status Flushed;Saline locked;Blood return noted 01/26/21 1639  Lumen #2 Status Flushed;Saline locked;Blood return noted 01/26/21 1639  Dressing Type Transparent;Securing device 01/26/21 1639  Dressing Status Clean;Dry;Intact 01/26/21 1639  Antimicrobial disc in place? Yes 01/26/21 1639  Safety Lock Not Applicable 01/26/21 1639  Line Care Connections checked and tightened 01/26/21 1639  Dressing Intervention New dressing 01/26/21 1639  Dressing Change Due 02/02/21 01/26/21 1639       Kathryn [name] 01/26/2021, 4:40 PM

## 2021-01-26 NOTE — Progress Notes (Signed)
Subjective: Feels "OK". Denies nausea or vomiting. Continues to have to suction oral secretions. Also reports intermittent regurgitation of fluid after tube feeds. Denies abdominal pain. No BM. Can't remember if she has had once since being in the hospital. No documented BMs. States she has passed some gas. Notes some rectal pressure.   Spoke with nursing staff.  No documented bowel movements.  She had episode of aspiration yesterday evening.  Continues to have to suction intermittently from her mouth with contents looking like tube feed.  States they are checking gastric residuals and they have all been greater than 300 mL.  Today, it was 600 mL.  Objective: Vital signs in last 24 hours: Temp:  [97.5 F (36.4 C)-98 F (36.7 C)] 98 F (36.7 C) (08/01 0806) Pulse Rate:  [97-126] 97 (08/01 0800) Resp:  [13-27] 16 (08/01 0800) BP: (85-160)/(56-78) 137/59 (08/01 0800) SpO2:  [91 %-100 %] 96 % (08/01 0800) Weight:  [52.3 kg] 52.3 kg (08/01 0300) Last BM Date:  (unable to verbalize/recall last BM) General:   Alert and oriented, pleasant, ill appearing.  Head:  Normocephalic and atraumatic. Eyes:  No icterus, sclera clear. Conjuctiva pink.  Abdomen:  Bowel sounds present, soft, non-distended. Mild diffuse TTP. No HSM or hernias noted. No rebound or guarding. No masses appreciated  Rectal: Formed stool in rectal vault. Only able to remove a very small amount of brown stool due to mushy consistency.  Msk:  Symmetrical without gross deformities. Normal posture. Extremities:  Without edema. Neurologic:  Alert and  oriented x4 Psych:  Flat affect.  Intake/Output from previous day: 07/31 0701 - 08/01 0700 In: 2345.6 [I.V.:1399.6; NG/GT:496; IV Piggyback:450] Out: 1730 [Urine:350; Drains:1380] Intake/Output this shift: Total I/O In: -  Out: 600 [Drains:600]  Lab Results: Recent Labs    01/24/21 0335 01/25/21 0434 01/26/21 0432  WBC 7.8 9.3 8.1  HGB 11.8* 9.6* 9.5*  HCT 37.7 30.5*  29.6*  PLT 161 154 164   BMET Recent Labs    01/24/21 0335 01/25/21 0434 01/26/21 0432  NA 139 144 143  K 3.9 3.5 3.4*  CL 107 110 109  CO2 26 28 29   GLUCOSE 108* 120* 120*  BUN 20 16 12   CREATININE 0.64 0.47 0.44  CALCIUM 8.2* 8.3* 8.2*   Studies/Results: DG Chest 1 View  Result Date: 01/25/2021 CLINICAL DATA:  Increased shortness of breath. EXAM: CHEST  1 VIEW COMPARISON:  Radiograph 01/12/2019 FINDINGS: Lower lung volumes from prior exam. Normal heart size with stable mediastinal contours. Development of patchy airspace disease in the right perihilar and right lung base. Minor left lung base atelectasis. Chronic elevation of left hemidiaphragm, accentuated by lower lung volumes. There may be a small right pleural effusion. No pneumothorax. There is mild residual barium in the upper abdomen from prior esophagram. IMPRESSION: Development of patchy airspace disease in the right perihilar and right lung base suspicious for pneumonia, aspiration is considered. Possible small right pleural effusion. Electronically Signed   By: 01/27/2021 M.D.   On: 01/25/2021 22:02   Narda Rutherford EKG SITE RITE  Result Date: 01/26/2021 If Site Rite image not attached, placement could not be confirmed due to current cardiac rhythm.   Assessment: 80 y.o. female with medical history significant for  hypertension, type 2 diabetes mellitus, GERD, positive ANA and SSA autoantibodies, and chronic achalasia (2014 manometry showed abnormal motility, possibly type II) that had been managed supportively, who presented to the emergency room on 7/19 after worsening regurgitation, increased oral secretions,  dysphagia, poor oral intake with episodes of vomiting over the last 2 months with associated significant weight loss.  She underwent EGD 7/21 with Dr. Karilyn Cota which revealed hypertonic LES, large amount of fluid in the stomach (suctioned), and Botox injection was performed.    Unfortunately, patient had very little  improvement with Botox therapy.  Extensive discussion was had between GI and general surgery.  Due to concern for underlying gastroparesis and possibly more diffuse motility issues related to an undiagnosed rheumatologic issue in light of significant fluid suctioned from the stomach on EGD, abdominal x-ray on 7/26 with residual barium in the stomach from esophagram 6 days prior, colonic ileus on x-ray, and positive ANA and SSA autoantibodies, it was ultimately decided that the best option would be to proceed with open G-tube placement as this could be transition to GJ tube if needed by IR 1 week after G-tube placement.    Open G-tube was placed on 7/28 and also had to perform partial small bowel resection due to adhesion of the small bowel to the midline with side-to-side anastomosis performed.  G-tube had to be reinserted on 7/29.  Trickle tube feeds were started on 7/30.  Unfortunately, patient experienced an episode of aspiration yesterday evening.  Chest x-ray consistent with aspiration pneumonia, now on Unasyn.  Tube feeds were stopped and patient was made NPO.  PICC line is being placed today with plans for TPN.  Per Dr. Henreitta Leber prior note, IR can exchange the G-tube to a GJ tube on 8/5.   There had been discussion of utilizing motility agents to assist with suspected generalized dysmotility. This will need to be discussed further once GJ tube is placed. Additionally, had considered referral for consideration of definitive achalasia management with Heller myotomy versus POEM.  However, with patient's frail condition, I am not sure she would be a surgical candidate.  Additionally, if patient has generalized motility disorder, correcting achalasia alone would not provide any significant benefit. This can be reconsidered once we are able to establish effective tube feeding and possibly trial motility agent.   Constipation/obstipation: No documented BM since admission, but reports passing flatus. Associated  rectal pressure. Likely multifactorial in the setting of little to no intake, decreased mobility, morphine, and suspected diffuse GI motility disorder.  On exam, bowel sounds are present, abdomen is soft and nondistended.  On rectal exam, she does have some formed stool in her rectal vault.  I was only able to remove a small amount of brown stool.  Will order Fleet enema. May need to consider adding additional agent such as Linzess per tube. Holding off on MiraLAX in light of added fluid with administration and patient's aspiration as per above.   Plan: Appreciate general surgery recommendations. Tube feeds on hold due to aspiration.  PICC line to be placed today with plans for TPN. Likely consult with IR on 8/5 to replace G-tube with GJ tube. Recommendations per General Surgery.  Will need to reevaluate possibility of motility agents and consideration of definitive achalasia management once GJ tube is placed and is successfully being used. Fleet enema today due to constipation. Will discuss with Dr. Marletta Lor on possibility of adding additional agent such as Linzess per G tube to help with constipation.  This is a difficult situation in the setting of dysmotility.    LOS: 13 days    01/26/2021, 12:41 PM   Ermalinda Memos, [facility] Gastroenterology

## 2021-01-26 NOTE — Progress Notes (Signed)
PHARMACY - TOTAL PARENTERAL NUTRITION CONSULT NOTE   Indication: Intolerance to enteral feeding  Patient Measurements: Height: 5\' 4"  (162.6 cm) Weight: 52.3 kg (115 lb 4.8 oz) IBW/kg (Calculated) : 54.7 TPN AdjBW (KG): 49.7 Body mass index is 19.79 kg/m. Usual Weight:   Assessment: Patient aspirated overnight and tube feeds were discontinued- consulted to transition to TPN.  Plan is replacement w/ G-tube by 8/5  Glucose / Insulin: 84-126 Electrolytes: WNL   K 3.4 Renal: stable Hepatic: pending Intake / Output; MIVF: LR w/ potassium @ 75 mL/hr   Central access: PICC line being placed 8/1 TPN start date: 8/1  Nutritional Goals (per RD recommendation on 7/29): kCal: 1600-1795, Protein: 75-80 g, Fluid: 1.6-1.7 liters daily   Current Nutrition:  Tube feeds- transitioning to TPN  Plan:  Start TPN at 40 mL/hr at 1800 Electrolytes in TPN: Na 35 mEq/L, K 10mEq/L, Ca 50mEq/L, Mg 59mEq/L, and Phos 25mmol/L. Cl:Ac 1:1 Add standard MVI and trace elements to TPN Initiate Sensitive q8h SSI and adjust as needed  Reduce MIVF to 35 mL/hr at 1800 Monitor TPN labs on Mon/Thurs,   12m, PharmD Clinical Pharmacist 01/26/2021 12:14 PM

## 2021-01-26 NOTE — Progress Notes (Signed)
3 Days Post-Op  Subjective: Events of last night noted.  Objective: Vital signs in last 24 hours: Temp:  [97.7 F (36.5 C)-98 F (36.7 C)] 98 F (36.7 C) (08/01 0806) Pulse Rate:  [97-121] 120 (08/01 1600) Resp:  [13-26] 22 (08/01 1600) BP: (85-183)/(58-89) 183/89 (08/01 1600) SpO2:  [92 %-99 %] 96 % (08/01 1600) Weight:  [52.3 kg-54.8 kg] 54.8 kg (08/01 1223) Last BM Date:  (unable to verbalize/recall last BM)  Intake/Output from previous day: 07/31 0701 - 08/01 0700 In: 2345.6 [I.V.:1399.6; NG/GT:496; IV Piggyback:450] Out: 1730 [Urine:350; Drains:1380] Intake/Output this shift: Total I/O In: 205.3 [I.V.:205.3] Out: 600 [Drains:600]  GI: Soft, flat.  Incision healing well with dressing in place.  Gastrostomy tube intact.  Lab Results:  Recent Labs    01/25/21 0434 01/26/21 0432  WBC 9.3 8.1  HGB 9.6* 9.5*  HCT 30.5* 29.6*  PLT 154 164   BMET Recent Labs    01/25/21 0434 01/26/21 0432  NA 144 143  K 3.5 3.4*  CL 110 109  CO2 28 29  GLUCOSE 120* 120*  BUN 16 12  CREATININE 0.47 0.44  CALCIUM 8.3* 8.2*   PT/INR No results for input(s): LABPROT, INR in the last 72 hours.  Studies/Results: DG Chest 1 View  Result Date: 01/25/2021 CLINICAL DATA:  Increased shortness of breath. EXAM: CHEST  1 VIEW COMPARISON:  Radiograph 01/12/2019 FINDINGS: Lower lung volumes from prior exam. Normal heart size with stable mediastinal contours. Development of patchy airspace disease in the right perihilar and right lung base. Minor left lung base atelectasis. Chronic elevation of left hemidiaphragm, accentuated by lower lung volumes. There may be a small right pleural effusion. No pneumothorax. There is mild residual barium in the upper abdomen from prior esophagram. IMPRESSION: Development of patchy airspace disease in the right perihilar and right lung base suspicious for pneumonia, aspiration is considered. Possible small right pleural effusion. Electronically Signed   By:  Narda Rutherford M.D.   On: 01/25/2021 22:02   [name] EKG SITE RITE  Result Date: 01/26/2021 If Site Rite image not attached, placement could not be confirmed due to current cardiac rhythm.   Anti-infectives: Anti-infectives (From admission, onward)    Start     Dose/Rate Route Frequency Ordered Stop   01/26/21 0100  Ampicillin-Sulbactam (UNASYN) 3 g in sodium chloride 0.9 % 100 mL IVPB        3 g 200 mL/hr over 30 Minutes Intravenous Every 6 hours 01/26/21 0009     01/26/21 0100  doxycycline (VIBRAMYCIN) 100 mg in sodium chloride 0.9 % 250 mL IVPB  Status:  Discontinued        100 mg 125 mL/hr over 120 Minutes Intravenous Every 12 hours 01/26/21 0009 01/26/21 0745   01/23/21 1300  cefoTEtan (CEFOTAN) 2 g in sodium chloride 0.9 % 100 mL IVPB        2 g 200 mL/hr over 30 Minutes Intravenous On call to O.R. 01/23/21 0959 01/23/21 1952   01/23/21 1157  sodium chloride 0.9 % with cefoTEtan (CEFOTAN) ADS Med       Note to Pharmacy: Devoria Glassing   : cabinet override      01/23/21 1157 01/23/21 1411   01/22/21 0600  ceFAZolin (ANCEF) IVPB 2g/100 mL premix        2 g 200 mL/hr over 30 Minutes Intravenous On call to O.R. 01/21/21 1747 01/22/21 0900   01/14/21 0830  cefTRIAXone (ROCEPHIN) 1 g in sodium chloride 0.9 % 100 mL IVPB  1 g 200 mL/hr over 30 Minutes Intravenous Every 24 hours 01/14/21 0735 01/16/21 1023   01/14/21 0800  fosfomycin (MONUROL) packet 3 g  Status:  Discontinued        3 g Oral  Once 01/14/21 9381 01/14/21 0734       Assessment/Plan: s/p Procedure(s): REINSERTION OF GASTROSTOMY TUBE Impression: Ongoing gastrointestinal dysmotility.  Agree with holding tube feeds.  PICC line has been placed for TPN.  Patient is scheduled to have a jejunostomy tube placed by interventional radiology on 01/30/2021.  LOS: 13 days    Franky Macho 01/26/2021

## 2021-01-26 NOTE — Progress Notes (Signed)
RN called due to concern for aspiration inpatient.  Chest x-ray was ordered and showed suspicion for aspiration pneumonia.  General surgery was notified by RN and ordered stopping the tube feed, G tube was clamped.  Patient was empirically started on IV Unasyn and doxycycline with plan to de-escalate/discontinue based on procalcitonin.  CBC and sputum culture pending.  Patient was afebrile. Continue incentive telemetry and flutter valve if able to cooperate.

## 2021-01-26 NOTE — Progress Notes (Signed)
Palliative: Kathryn [name] is lying quietly in bed in a dark room.  She does not make or keep eye contact.  She appears acutely/chronically ill and very frail.  Kathryn [name] is able to tell me her name, that we are in the hospital, and that it is the end of July.  I believe that she is able to make her basic needs known.  Her sister, Regenia Skeeter, is at bedside.  Regenia Skeeter tells me that she is in nursing.  We talked at length about Kathryn [name] acute health problems including, but not limited to, small bowel resection last week, abdominal x-ray from 7/26 showing barium and stomach from a study 6 days prior, PEG tube pulled out then replaced, aspiration of tube feeding, poor gastric motility but unable to use motility agent for 1 week after surgery, possibility of GJ tube by IR, selected labs including kidney function, no BM documented since admission, the treatment plan including but not limited to IV antibiotics, fluids, further testing.  Overall, Fett seems very knowledgeable and understanding of Kathryn [name] health concerns.  After reassurance, Kathryn [name] and I talked about CODE STATUS.  I asked that if we are doing all these things for her, and she worsens, would she want to be on life support.  Kathryn [name] gently shakes her head no, which Regenia Skeeter is able to witness.  Kathryn [name] shares, "I am not afraid to go" and was going to make another statement when Fett interrupted and states "you are going anywhere".  I asked Kathryn [name] to finish her statement.  She tells Kathryn [name] that she is not afraid to go anywhere, but wants to continue to treat.  I reassured her that she will continue with medical treatment.  Regenia Skeeter shares that she can continue CODE STATUS discussions with daughter, Kathryn [name].   Conference with attending, bedside nursing staff, transition of care team related to patient condition, needs, goals of care, CODE STATUS.  Plan: At this point continue to treat the treatable, time for outcomes.  Family is continuing CODE STATUS  discussions.  45 minutes  Kathryn Carmel, NP Palliative medicine team Team phone [phone] Greater than 50% of this time was spent counseling and coordinating care related to the above assessment and plan.

## 2021-01-26 NOTE — Progress Notes (Signed)
Nutrition Follow-up  DOCUMENTATION CODES:   Non-severe (moderate) malnutrition in context of chronic illness  INTERVENTION:  TPN per pharmacy  Recommend: Monitor magnesium, potassium, and phosphorus daily for at least 3 days, MD to replete as needed, as pt is at risk for refeeding syndrome given prolonged poor intake.   NUTRITION DIAGNOSIS:   Moderate Malnutrition related to chronic illness (achalasia) as evidenced by energy intake < or equal to 75% for > or equal to 1 month, mild fat depletion, mild muscle depletion.   GOAL:  Patient will meet greater than or equal to 90% of their needs   MONITOR:  Diet advancement, Supplement acceptance, PO intake, Weight trends  REASON FOR ASSESSMENT:   Malnutrition Screening Tool    ASSESSMENT: patient is a 80 yo female with history of DM-2, HTN, GERD and chronic achalasia. Patient presents with epigastric pain, weakness and dehydration.   Positive ANA and SSA antibody (to follow up with Rheumatology).  Patient is currently NPO. Poor tolerance of oral intake per chart review. No family at bedside during RD visit. Expect pt meeting < 75% of energy needs the past month based on chart review. RD assessed pt on 7/1 and she was eating very little at that time. Question if pt a candidate for tube feeding if chronically unable to meet nutrition needs orally.   Weight loss reported per chart review. Patient weight 10/24/20 -56.7 kg (124.7 lb) and currently 54.4 kg (119.6 lb).    7/20 SLP swallow evaluation- dysphagia NPO   7/21 EGD w/botox. Stomach filled with fluid, possible gastroparesis. Palliative GOC-pt doesn't want a feeding tube  7/22 Per GI- consider Heller myotomy or POEM. Patient suctions excess fluids and is open to further discuss tube feeding. Diet advanced to clear liquids.   7/26 Per GI abdominal x-ray question for ileus. General surgery consulted possible G-tube placement Friday.   7/27 Discussed patient during rounds. Clear  liquid diet and protein with intake 0-50% the past 3 days. There are at least 6-8 containers of fluids staked on pt table besides the untouched liquids on her tray. Talked with patient and daughter who was bedside trying to encourage pt to take fluids. No intake at lunch and daughter says pt is afraid she will vomit. Per daughter they have decided to pursue PEG placement and plans are for Friday. Recommendations for enteral feeding above. Patient is high refeeding risk due to her prolong poor intake.   7/28 Open gastrostomy tube placed due to malnutrition, esophageal dysmotility. No enteral feeding only use for medications per MD.   7/29 SLP has signed off. Patient g-tube pulled out overnight. Will require operating room for replacement. Patient remains NPO. Plans were to start tube feeding today per MD note. Recommend consider providing D5% while pt is waiting for new PEG. Last BM 7/25 per nursing.  8/1 Talked with nursing and patient discussed during rounds. Patient not tolerating tube feeds and will need TPN while waiting for PEG-J conversion scheduled 8/5. PICC being placed today. TPN @ 40 ml/hr start @ 1800 today. Last BM 7/25. At risk for refeeding.   Medications reviewed and include:  protonix.  Labs: BMP Latest Ref Rng & Units 01/26/2021 01/25/2021 01/24/2021  Glucose 70 - 99 mg/dL 867(J) 449(E) 010(O)  BUN 8 - 23 mg/dL 12 16 20   Creatinine 0.44 - 1.00 mg/dL 7.12 1.97  Sodium 135 - 145 mmol/L 143 144 139  Potassium 3.5 - 5.1 mmol/L 3.4(L) 3.5 3.9  Chloride 98 - 111 mmol/L 109  110 107  CO2 22 - 32 mmol/L 29 28 26   Calcium 8.9 - 10.3 mg/dL 8.2(L) 8.3(L) 8.2(L)      NUTRITION - FOCUSED PHYSICAL EXAM: Nutrition-Focused physical exam completed. Findings are mild upper arm fat depletion, moderate clavicle and mild temporal muscle depletion, and no edema.     Diet Order:   Diet Order             Diet NPO time specified  Diet effective now                   EDUCATION NEEDS:   Not appropriate for education at this time  Skin:  Skin Assessment: Reviewed RN Assessment  Last BM:  7/25   Height:   Ht Readings from Last 1 Encounters:  01/23/21 5\' 4"  (1.626 m)    Weight:   Wt Readings from Last 1 Encounters:  01/26/21 52.3 kg    Ideal Body Weight:   52 kg  BMI:  Body mass index is 19.79 kg/m.  Estimated Nutritional Needs:   Kcal:  1600-1795  Protein:  75-80 gr  Fluid:  1.6-1.7 liters daily  MS,RD,CSG,LDN Contact: 03/28/21

## 2021-01-26 NOTE — Progress Notes (Signed)
Fleet enema given to patient. No output with enema itself, however this RN was able to disimpact the stool that was present in her rectum as the enema had softened it. Patient tolerated procedure well. Stool characteristics charted in flowsheet.

## 2021-01-27 DIAGNOSIS — K220 Achalasia of cardia: Secondary | ICD-10-CM | POA: Diagnosis not present

## 2021-01-27 DIAGNOSIS — K5900 Constipation, unspecified: Secondary | ICD-10-CM | POA: Diagnosis not present

## 2021-01-27 DIAGNOSIS — R1010 Upper abdominal pain, unspecified: Secondary | ICD-10-CM | POA: Diagnosis not present

## 2021-01-27 DIAGNOSIS — R1310 Dysphagia, unspecified: Secondary | ICD-10-CM | POA: Diagnosis not present

## 2021-01-27 LAB — DIFFERENTIAL
Basophils Absolute: 0 10*3/uL (ref 0.0–0.1)
Basophils Relative: 0 %
Eosinophils Absolute: 0 10*3/uL (ref 0.0–0.5)
Eosinophils Relative: 0 %
Lymphocytes Relative: 6 %
Lymphs Abs: 0.3 10*3/uL — ABNORMAL LOW (ref 0.7–4.0)
Monocytes Absolute: 0.1 10*3/uL (ref 0.1–1.0)
Monocytes Relative: 1 %
Neutro Abs: 5.2 10*3/uL (ref 1.7–7.7)
Neutrophils Relative %: 93 %
nRBC: 1 /100 WBC — ABNORMAL HIGH

## 2021-01-27 LAB — GLUCOSE, CAPILLARY
Glucose-Capillary: 162 mg/dL — ABNORMAL HIGH (ref 70–99)
Glucose-Capillary: 163 mg/dL — ABNORMAL HIGH (ref 70–99)
Glucose-Capillary: 167 mg/dL — ABNORMAL HIGH (ref 70–99)
Glucose-Capillary: 176 mg/dL — ABNORMAL HIGH (ref 70–99)
Glucose-Capillary: 188 mg/dL — ABNORMAL HIGH (ref 70–99)
Glucose-Capillary: 208 mg/dL — ABNORMAL HIGH (ref 70–99)

## 2021-01-27 LAB — COMPREHENSIVE METABOLIC PANEL
ALT: 6 U/L (ref 0–44)
AST: 17 U/L (ref 15–41)
Albumin: 1.9 g/dL — ABNORMAL LOW (ref 3.5–5.0)
Alkaline Phosphatase: 56 U/L (ref 38–126)
Anion gap: 3 — ABNORMAL LOW (ref 5–15)
BUN: 9 mg/dL (ref 8–23)
CO2: 32 mmol/L (ref 22–32)
Calcium: 8.4 mg/dL — ABNORMAL LOW (ref 8.9–10.3)
Chloride: 109 mmol/L (ref 98–111)
Creatinine, Ser: 0.33 mg/dL — ABNORMAL LOW (ref 0.44–1.00)
GFR, Estimated: >60 mL/min (ref >60)
Glucose, Bld: 186 mg/dL — ABNORMAL HIGH (ref 70–99)
Potassium: 3.6 mmol/L (ref 3.5–5.1)
Sodium: 144 mmol/L (ref 135–145)
Total Bilirubin: 0.5 mg/dL (ref 0.3–1.2)
Total Protein: 5.7 g/dL — ABNORMAL LOW (ref 6.5–8.1)

## 2021-01-27 LAB — CBC
HCT: 28.1 % — ABNORMAL LOW (ref 36.0–46.0)
Hemoglobin: 8.8 g/dL — ABNORMAL LOW (ref 12.0–15.0)
MCH: 31.2 pg (ref 26.0–34.0)
MCHC: 31.3 g/dL (ref 30.0–36.0)
MCV: 99.6 fL (ref 80.0–100.0)
Platelets: 155 10*3/uL (ref 150–400)
RBC: 2.82 MIL/uL — ABNORMAL LOW (ref 3.87–5.11)
RDW: 14.8 % (ref 11.5–15.5)
WBC: 5.6 10*3/uL (ref 4.0–10.5)
nRBC: 0 % (ref 0.0–0.2)

## 2021-01-27 LAB — MAGNESIUM: Magnesium: 1.7 mg/dL (ref 1.7–2.4)

## 2021-01-27 LAB — PHOSPHORUS: Phosphorus: 1.3 mg/dL — ABNORMAL LOW (ref 2.5–4.6)

## 2021-01-27 LAB — TRIGLYCERIDES: Triglycerides: 108 mg/dL (ref <150)

## 2021-01-27 LAB — PREALBUMIN: Prealbumin: 5.9 mg/dL — ABNORMAL LOW (ref 18–38)

## 2021-01-27 MED ORDER — TRAVASOL 10 % IV SOLN
INTRAVENOUS | Status: AC
  Filled 2021-01-27: qty 768

## 2021-01-27 MED ORDER — CHLORHEXIDINE GLUCONATE CLOTH 2 % EX PADS
6.0000 | MEDICATED_PAD | Freq: Every day | CUTANEOUS | Status: DC
  Administered 2021-01-27 – 2021-02-06 (×11): 6 via TOPICAL

## 2021-01-27 NOTE — Progress Notes (Addendum)
PHARMACY - TOTAL PARENTERAL NUTRITION CONSULT NOTE   Indication: Intolerance to enteral feeding  Patient Measurements: Height: 5\' 4"  (162.6 cm) Weight: 53.2 kg (117 lb 4.6 oz) IBW/kg (Calculated) : 54.7 TPN AdjBW (KG): 49.7 Body mass index is 20.13 kg/m. Usual Weight:   Assessment: Patient aspirated overnight and tube feeds were discontinued- consulted to transition to TPN.  Plan is replacement w/ G-Jtube on 8/5. Patient tolerating TPN, will advance. Increase rate, K, and Magnesium  Glucose / Insulin: 106-188 ;  3 units insulin Electrolytes: WNL   K 3.6, Mg 1.7 Renal: stable Hepatic: WNL Intake / Output; MIVF: LR w/ potassium @ 75 mL/hr   Central access: PICC line being placed 8/1 TPN start date: 8/1  Nutritional Goals (per RD recommendation on 7/29): kCal: 1600-1795, Protein: 75-80 g, Fluid: 1.6-1.7 liters daily  Current Nutrition:  Tube feeds- transitioning to TPN  Plan:  Increase TPN to 80 mL/hr at 1800 Electrolytes in TPN: Na 35 mEq/L, K 50mEq/L, Ca 74mEq/L, Mg 58mEq/L, and Phos 47mmol/L. Cl:Ac 1:1 Add standard MVI and trace elements to TPN Initiate Sensitive q8h SSI and adjust as needed  Reduce MIVF to off at 1800 Monitor TPN labs on Mon/Thurs,   12m, BS Pharm D, BCPS Clinical Pharmacist Pager [phone]  01/27/2021 11:00 AM

## 2021-01-27 NOTE — Progress Notes (Signed)
Nutrition Follow-up  DOCUMENTATION CODES:   Non-severe (moderate) malnutrition in context of chronic illness  INTERVENTION:  TPN per pharmacy  Recommend: Monitor magnesium, potassium, and phosphorus daily for at least 3 days, MD to replete as needed, as pt is at risk for refeeding syndrome given prolonged poor intake.   NUTRITION DIAGNOSIS:   Moderate Malnutrition related to chronic illness (achalasia) as evidenced by energy intake < or equal to 75% for > or equal to 1 month, mild fat depletion, mild muscle depletion. - receiving TPN and plans to resume enteral nutrition once PEG-J is placed  GOAL:  Patient will meet greater than or equal to 90% of their needs -progressing   MONITOR:  Diet advancement, Supplement acceptance, PO intake, Weight trends   ASSESSMENT: patient is a 80 yo female with history of DM-2, HTN, GERD and chronic achalasia. Patient presents with epigastric pain, weakness and dehydration.   Positive ANA and SSA antibody (to follow up with Rheumatology).  Patient is currently NPO. Poor tolerance of oral intake per chart review. No family at bedside during RD visit. Expect pt meeting < 75% of energy needs the past month based on chart review. RD assessed pt on 7/1 and she was eating very little at that time. Question if pt a candidate for tube feeding if chronically unable to meet nutrition needs orally.   Weight loss reported per chart review. Patient weight 10/24/20 -56.7 kg (124.7 lb) and currently 54.4 kg (119.6 lb).    7/29 SLP has signed off. Patient g-tube pulled out overnight. Will require operating room for replacement. Patient remains NPO. Plans were to start tube feeding today per MD note. Recommend consider providing D5% while pt is waiting for new PEG. Last BM 7/25 per nursing.  8/1 Talked with nursing and patient discussed during rounds. Patient not tolerating tube feeds and will need TPN while waiting for PEG-J conversion scheduled 8/5. PICC being  placed today. TPN @ 40 ml/hr start @ 1800 today. Last BM 7/25. At risk for refeeding.   8/2 Patient given enema and was disimpacted per nursing. Patient TPN advancing today to 80 ml/hr per pharmacy note. Standard MVI and trace elements added. Plan remains for PEG-J on Friday -IR at [facility]. Patient weight currently 53.2 kg (117 lb).   Medications reviewed and include:  protonix.  Intake/Output Summary (Last 24 hours) at 01/27/2021 1334 Last data filed at 01/27/2021 1306 Gross per 24 hour  Intake 2083.28 ml  Output 2350 ml  Net -266.72 ml    Labs: BMP Latest Ref Rng & Units 01/27/2021 01/26/2021 01/25/2021  Glucose 70 - 99 mg/dL 409(W) 119(J) 478(G)  BUN 8 - 23 mg/dL 9 12 16   Creatinine 0.44 - 1.00 mg/dL ) 9.56(O 1.30  Sodium 135 - 145 mmol/L 144 143 144  Potassium 3.5 - 5.1 mmol/L 3.6 3.4(L) 3.5  Chloride 98 - 111 mmol/L 109 109 110  CO2 22 - 32 mmol/L 32 29 28  Calcium 8.9 - 10.3 mg/dL 8.65) 7.8(I) 8.3(L)      NUTRITION - FOCUSED PHYSICAL EXAM: Nutrition-Focused physical exam completed. Findings are mild upper arm fat depletion, moderate clavicle and mild temporal muscle depletion, and no edema.     Diet Order:   Diet Order             Diet NPO time specified  Diet effective now                   EDUCATION NEEDS:  Not appropriate for education at  this time  Skin:  Skin Assessment: Reviewed RN Assessment  Last BM:  8/1 given enema and manually disimpacted by nursing per RN note   Height:   Ht Readings from Last 1 Encounters:  01/23/21 5\' 4"  (1.626 m)    Weight:   Wt Readings from Last 1 Encounters:  01/27/21 53.2 kg    Ideal Body Weight:   52 kg  BMI:  Body mass index is 20.13 kg/m.  Estimated Nutritional Needs:   Kcal:  1600-1795  Protein:  75-80 gr  Fluid:  1.6-1.7 liters daily  03/29/21 MS,RD,CSG,LDN Contact: Royann Shivers

## 2021-01-27 NOTE — Progress Notes (Signed)
Subjective: Patient has no complaints for me today.  She denies abdominal pain, nausea, or vomiting.  States she has not had to suction quite as much from her mouth today.  No bowel movement. Rectal pressure resolved since yesterday. States she is passing gas.  Objective: Vital signs in last 24 hours: Temp:  [97.9 F (36.6 C)-98.6 F (37 C)] 98.4 F (36.9 C) (08/02 1200) Pulse Rate:  [86-120] 97 (08/02 1300) Resp:  [18-25] 20 (08/02 1000) BP: (125-183)/(61-95) 171/88 (08/02 1300) SpO2:  [92 %-100 %] 99 % (08/02 1300) Weight:  [53.2 kg] 53.2 kg (08/02 0400) Last BM Date: 01/26/21 General:   Alert and oriented, pleasant, NAD Head:  Normocephalic and atraumatic. Eyes:  No icterus, sclera clear. Conjuctiva pink.  Abdomen:  Bowel sounds present, soft, non-distended.  Mild generalized tenderness to palpation, similar to yesterday's exam.  No rebound or guarding. No masses appreciated PEG site clean and try with dressing in place.  Msk:  Symmetrical without gross deformities. Normal posture. Extremities:  Without edema. Neurologic:  Alert and  oriented x4;  grossly normal neurologically. Skin:  Warm and dry, intact without significant lesions.  Psych:  Normal mood and affect.  Intake/Output from previous day: 08/01 0701 - 08/02 0700 In: 1550.8 [I.V.:1150.8; IV Piggyback:400] Out: 1970 [Urine:800; Drains:1170] Intake/Output this shift: Total I/O In: 532.5 [I.V.:532.5] Out: 980 [Urine:900; Drains:80]  Lab Results: Recent Labs    01/25/21 0434 01/26/21 0432 01/27/21 0341  WBC 9.3 8.1 5.6  HGB 9.6* 9.5* 8.8*  HCT 30.5* 29.6* 28.1*  PLT 154 164 155   BMET Recent Labs    01/25/21 0434 01/26/21 0432 01/27/21 0341  NA 144 143 144  K 3.5 3.4* 3.6  CL 110 109 109  CO2 28 29 32  GLUCOSE 120* 120* 186*  BUN 16 12 9   CREATININE 0.47 0.44 0.33*  CALCIUM 8.3* 8.2* 8.4*   LFT Recent Labs    01/27/21 0341  PROT 5.7*  ALBUMIN 1.9*  AST 17  ALT 6  ALKPHOS 56  BILITOT  0.5    Studies/Results: DG Chest 1 View  Result Date: 01/25/2021 CLINICAL DATA:  Increased shortness of breath. EXAM: CHEST  1 VIEW COMPARISON:  Radiograph 01/12/2019 FINDINGS: Lower lung volumes from prior exam. Normal heart size with stable mediastinal contours. Development of patchy airspace disease in the right perihilar and right lung base. Minor left lung base atelectasis. Chronic elevation of left hemidiaphragm, accentuated by lower lung volumes. There may be a small right pleural effusion. No pneumothorax. There is mild residual barium in the upper abdomen from prior esophagram. IMPRESSION: Development of patchy airspace disease in the right perihilar and right lung base suspicious for pneumonia, aspiration is considered. Possible small right pleural effusion. Electronically Signed   By: 01/14/2019 M.D.   On: 01/25/2021 22:02   DG CHEST PORT 1 VIEW  Result Date: 01/26/2021 CLINICAL DATA:  PICC line placement EXAM: PORTABLE CHEST 1 VIEW COMPARISON:  01/25/2021 FINDINGS: Right upper extremity PICC line tip is seen within the superior cavoatrial junction. Right basilar pulmonary infiltrate again noted, compatible with infection or aspiration the appropriate clinical setting, has improved. No pneumothorax or pleural effusion. Cardiac size within normal limits. Pulmonary vascularity is normal. IMPRESSION: Right upper extremity PICC line tip within the superior cavoatrial junction. Improving right basilar consolidation. Electronically Signed   By: 01/27/2021 MD   On: 01/26/2021 19:02   03/28/2021 EKG SITE RITE  Result Date: 01/26/2021 If Site Rite image not attached,  placement could not be confirmed due to current cardiac rhythm.   Assessment: 80 y.o. female with medical history significant for  hypertension, type 2 diabetes mellitus, GERD, positive ANA and SSA autoantibodies, and chronic achalasia (per review of manometry procedure note 03/07/2013, findings were consistent with type II achalasia)  previously managed supportively, who presented to the emergency room on 7/19 after worsening regurgitation, increased oral secretions, dysphagia, poor oral intake with episodes of vomiting over the last 2 months with associated significant weight loss.   She underwent EGD 7/21 with Dr. Karilyn Cota which revealed hypertonic LES, large amount of fluid in the stomach (suctioned), and Botox injection was performed.  Unfortunately, patient had no real improvement with Botox therapy.  Extensive discussion was had between GI and general surgery.  Due to concern for gastroparesis and likely more diffuse motility issues possibly secondary to undiagnosed rheumatologic condition, it was decided to proceed with open G-tube placement as this could be transition to GJ tube if needed by IR 1 week after G-tube was placed.  Open G-tube was placed on 7/28.  Partial small bowel resection was also performed due to adhesion of the small bowel to the midline with side-to-side anastomosis performed.  NG tube had to be reinserted on 7/29.  She started on trickle tube feeds on 7/30, but experienced an episode of aspiration the evening of 7/31, now with aspiration pneumonia on Unasyn.  Due to this, tube feeds were stopped and PICC line was placed with plans for TPN until G-tube can be converted to GJ tube on 8/5 with IR.   There had been discussion of utilizing motility agents to assist with suspected generalized dysmotility. This will need to be discussed further once GJ tube is placed. Additionally, had considered referral for consideration of definitive achalasia management with Heller myotomy versus POEM.  However, with patient's frail condition, I am not sure she would be a surgical candidate.  Additionally, if patient has generalized motility disorder, correcting achalasia alone would not provide any significant benefit. This can be reconsidered if we are able to establish effective tube feeding and possibly trial motility agent.    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 Attempted enema yesterday due to formed stool in the rectal vault.  Enema was not successful; however, RN was able to disimpact stool from the rectum following enema. This was the first passage of stool since 7/25.  She is not currently on a daily bowel regimen.  Dr. Marletta Lor discussed case with Dr. Lovell Sheehan who stated it would be okay to start MiraLAX daily.  Hold off on other agents for now.  Plan: Appreciate general surgery input. Continue TPN. Await intervention with IR to convert G-tube to GJ tube on 8/5. Start MiraLAX 17 g today per tube. Will need to reevaluate possibility of motility agents and consideration of definitive achalasia management once GJ tube is placed and is successfully being used.    LOS: 14 days    01/27/2021, 2:16 PM   Ermalinda Memos, [facility]

## 2021-01-27 NOTE — Progress Notes (Signed)
4 Days Post-Op  Subjective: No apparent pain  Objective: Vital signs in last 24 hours: Temp:  [97.9 F (36.6 C)-98.6 F (37 C)] 98.4 F (36.9 C) (08/02 0743) Pulse Rate:  [91-120] 95 (08/02 1000) Resp:  [18-25] 20 (08/02 1000) BP: (125-183)/(61-95) 166/76 (08/02 1000) SpO2:  [92 %-98 %] 97 % (08/02 1000) Weight:  [53.2 kg-54.8 kg] 53.2 kg (08/02 0400) Last BM Date: 01/26/21  Intake/Output from previous day: 08/01 0701 - 08/02 0700 In: 1550.8 [I.V.:1150.8; IV Piggyback:400] Out: 1970 [Urine:800; Drains:1170] Intake/Output this shift: Total I/O In: 300.1 [I.V.:300.1] Out: -   GI: Abdomen soft and flat.  PEG site clean and dry. Last gastric aspirate was 100 cc.  Lab Results:  Recent Labs    01/26/21 0432 01/27/21 0341  WBC 8.1 5.6  HGB 9.5* 8.8*  HCT 29.6* 28.1*  PLT 164 155   BMET Recent Labs    01/26/21 0432 01/27/21 0341  NA 143 144  K 3.4* 3.6  CL 109 109  CO2 29 32  GLUCOSE 120* 186*  BUN 12 9  CREATININE 0.44 0.33*  CALCIUM 8.2* 8.4*   PT/INR No results for input(s): LABPROT, INR in the last 72 hours.  Studies/Results: DG Chest 1 View  Result Date: 01/25/2021 CLINICAL DATA:  Increased shortness of breath. EXAM: CHEST  1 VIEW COMPARISON:  Radiograph 01/12/2019 FINDINGS: Lower lung volumes from prior exam. Normal heart size with stable mediastinal contours. Development of patchy airspace disease in the right perihilar and right lung base. Minor left lung base atelectasis. Chronic elevation of left hemidiaphragm, accentuated by lower lung volumes. There may be a small right pleural effusion. No pneumothorax. There is mild residual barium in the upper abdomen from prior esophagram. IMPRESSION: Development of patchy airspace disease in the right perihilar and right lung base suspicious for pneumonia, aspiration is considered. Possible small right pleural effusion. Electronically Signed   By: Narda Rutherford M.D.   On: 01/25/2021 22:02   DG CHEST PORT 1  VIEW  Result Date: 01/26/2021 CLINICAL DATA:  PICC line placement EXAM: PORTABLE CHEST 1 VIEW COMPARISON:  01/25/2021 FINDINGS: Right upper extremity PICC line tip is seen within the superior cavoatrial junction. Right basilar pulmonary infiltrate again noted, compatible with infection or aspiration the appropriate clinical setting, has improved. No pneumothorax or pleural effusion. Cardiac size within normal limits. Pulmonary vascularity is normal. IMPRESSION: Right upper extremity PICC line tip within the superior cavoatrial junction. Improving right basilar consolidation. Electronically Signed   By: Helyn Numbers MD   On: 01/26/2021 19:02   [name] EKG SITE RITE  Result Date: 01/26/2021 If Site Rite image not attached, placement could not be confirmed due to current cardiac rhythm.   Anti-infectives: Anti-infectives (From admission, onward)    Start     Dose/Rate Route Frequency Ordered Stop   01/26/21 0100  Ampicillin-Sulbactam (UNASYN) 3 g in sodium chloride 0.9 % 100 mL IVPB        3 g 200 mL/hr over 30 Minutes Intravenous Every 6 hours 01/26/21 0009     01/26/21 0100  doxycycline (VIBRAMYCIN) 100 mg in sodium chloride 0.9 % 250 mL IVPB  Status:  Discontinued        100 mg 125 mL/hr over 120 Minutes Intravenous Every 12 hours 01/26/21 0009 01/26/21 0745   01/23/21 1300  cefoTEtan (CEFOTAN) 2 g in sodium chloride 0.9 % 100 mL IVPB        2 g 200 mL/hr over 30 Minutes Intravenous On call to O.R.  01/23/21 0959 01/23/21 1952   01/23/21 1157  sodium chloride 0.9 % with cefoTEtan (CEFOTAN) ADS Med       Note to Pharmacy: Devoria Glassing   : cabinet override      01/23/21 1157 01/23/21 1411   01/22/21 0600  ceFAZolin (ANCEF) IVPB 2g/100 mL premix        2 g 200 mL/hr over 30 Minutes Intravenous On call to O.R. 01/21/21 1747 01/22/21 0900   01/14/21 0830  cefTRIAXone (ROCEPHIN) 1 g in sodium chloride 0.9 % 100 mL IVPB        1 g 200 mL/hr over 30 Minutes Intravenous Every 24 hours 01/14/21 0735  01/16/21 1023   01/14/21 0800  fosfomycin (MONUROL) packet 3 g  Status:  Discontinued        3 g Oral  Once 01/14/21 0711 01/14/21 0734       Assessment/Plan: s/p Procedure(s): REINSERTION OF GASTROSTOMY TUBE Impression: Did not tolerate PEG tube feedings and is being treated for aspiration pneumonia.  The plan is to have interventional radiology place a jejunostomy tube on 01/30/2021.  We will follow peripherally with you.  LOS: 14 days    Kathryn [name] 01/27/2021

## 2021-01-27 NOTE — Progress Notes (Signed)
PROGRESS NOTE    Kathryn [name]  WUJ:[id number] DOB: 1941/04/26 DOA: 01/13/2021 PCP: Suzan Slick, MD   Brief Narrative:   Kathryn [name] is a 80 y.o. female with medical history significant for  hypertension, type 2 diabetes mellitus, GERD, positive ANA and SSA autoantibodies, and chronic achalasia (2014 manometry showed abnormal motility)  who presents to the emergency department due to more than 2 months history of decreased oral intake, generalized weakness and about 3 to 4-week onset of abdominal pain associated with difficulty in being able to swallow (solid and liquid).  She believes that she must have lost up to 30 pounds since onset of symptoms.  Patient endorsed that she usually regurgitates any food/liquid ingested within 15 to 20 minutes of ingestion.  Abdominal pain was in the epigastric area and was sharp in nature, this worsens with food.  She denies fever, chills, chest pain, shortness of breath.   She was admitted to the hospital from 12/25/2020-12/28/2020, at that time barium p.o. esophagram done urgently as an outpatient was indicated.  She was also admitted from 10/23/2020 to 10/25/2020 during which patient presented with proximal muscle weakness with an elevated CPK of 1206 that was concerning for polymyalgia rheumatica. This was treated with prednisone with improvement.  EGD done on 10/04/2020 showed normal dilated esophagus and small hiatal hernia.   In the emergency department, she was intermittently tachypneic and tachycardic.  Other vital signs were within normal range.  Work-up in the ED showed normocytic anemia and leukocytosis, BMP was normal except for elevated BUN at and hyperglycemia with CBG of 135.  Albumin 3.2. CT of head without contrast showed no acute intracranial abnormality. IV hydration was provided, hospitalist was asked to admit patient for further evaluation and management.  -Patient continued to experience significant epigastric pain and underwent EGD with  Botox treatment on 7/21.  She continued to have difficulty swallowing and plans were being made for PEG placement which patient was agreeable to.  She underwent open gastrostomy tube placement on 7/28 and then had dislodgment on 7/29 which required subsequent placement on that date.  She started on trickle feeds on 7/30 and feeds were being advanced, but unfortunately on 8/1 she had recurrent aspiration and is now on Unasyn for coverage.  Tube feeds had to be discontinued and plans are now for conversion to GJ tube on 8/5 per IR.  In the meantime, she remains on TPN feeds via PICC line.  Assessment & Plan:   Principal Problem:   Abdominal pain Active Problems:   HTN (hypertension)   Dysphagia   Nausea and vomiting   Generalized weakness   Dehydration   Normocytic anemia   Leukocytosis   Hyperglycemia   Hypoalbuminemia due to protein-calorie malnutrition (HCC)   Prolonged QT interval   Achalasia   Malnutrition of moderate degree   Hypoactive bowel sounds   Dislodged gastrostomy tube   Epigastric/Abdominal pain, nausea and vomiting in the setting of esophageal dysphagia Patient is ANA/SSA antibody positive. Sjogren's syndrome and SLE are on the differential. Patient also has had increased oral secretion, suggestive of sicca-related symptoms. Gastrointestinal involvement of SLE can manifest as esophageal motility disorder, such as achalasia, which has been suggesting in this patient. Other GI manifestations of SLE include acute pancreatitis, for which  patient signs and symptoms are concerning, but CT 6/30 has shown no acute pancreatic findings. EGD in April 2022 showed unremarkable esophagus, and patient underwent dialtion given history of dysphagia, but patient states that did not  help with symptoms.   -Continue IV morphine 0.5 mg q.4h p.r.n. for severe pain -Continue Protonix 40 mg po BID -Continue clear liquid diet with plan to advanced diet as tolerated -Gastroenterology following for  further recommendations -ongoing goals of care discussions with palliative team, patient and daughter regarding tube feeding -Patient has undergone G-tube placement on 7/28 with dislodgment noted on 7/29 which has undergone replacement -She unfortunately developed aspiration pneumonia overnight and is on Unasyn now for coverage.  Tube feeds have been discontinued.  PICC line placed 8/1 and TPN initiated.  Plan for GJ tube conversion on 8/5. -She had fecal impaction on 8/1 that was mechanically disimpacted.   Aspiration pneumonia -Cover with Unasyn and monitor, currently day 2/7   Generalized weakness and dehydration in the setting of above -Hypoalbuminemia secondary to mild protein calorie malnutrition -Continue IV hydration as described above -Continue protein supplement - surgery consult for PEG placement likely 7/29   Urinary tract infection - TREATED - UA in the ED showed many bacteria, moderate ketones, nitrites -Patient is on ceftriaxone 1g for coverage (completed on 7/23)  Normocytic anemia -Chronic and Stable   COVID-19 infection (incidental finding) -Patient is asymptomatic and stable on room air -This was already diagnosed during last admission   Essential hypertension -Continue Lopressor but since can't take tabs now, we changed it to IV lopressor 2.5 mg Q6h until she can take the tablets again  Hyperglycemia secondary to T2DM -Hemoglobin A1c on 6/30-5.8%   GERD -Continue Protonix     DVT prophylaxis:SCD Code Status: Full Family Communication: Daughter on phone 8/1, sister-in-law [name] on phone Disposition Plan:  Status is: Inpatient   Remains inpatient appropriate because:Ongoing diagnostic testing needed not appropriate for outpatient work up and IV treatments appropriate due to intensity of illness or inability to take PO   Dispo: The patient is from: Home              Anticipated d/c is to: SNF              Patient currently is not medically stable to  d/c.              Difficult to place patient No   Consultants:  Gastroenterology Palliative care Surgery (regarding PEG placement) IR   Procedures:  Barium swallow 7/20 EGD with botox injection 7/21 Open gastrostomy tube placement with lysis of adhesions and small bowel resection 7/28 Repeat placement of open gastrostomy tube on 7/29 PICC line for TPN 8/1  Antimicrobials:  Anti-infectives (From admission, onward)    Start     Dose/Rate Route Frequency Ordered Stop   01/26/21 0100  Ampicillin-Sulbactam (UNASYN) 3 g in sodium chloride 0.9 % 100 mL IVPB        3 g 200 mL/hr over 30 Minutes Intravenous Every 6 hours 01/26/21 0009     01/26/21 0100  doxycycline (VIBRAMYCIN) 100 mg in sodium chloride 0.9 % 250 mL IVPB  Status:  Discontinued        100 mg 125 mL/hr over 120 Minutes Intravenous Every 12 hours 01/26/21 0009 01/26/21 0745   01/23/21 1300  cefoTEtan (CEFOTAN) 2 g in sodium chloride 0.9 % 100 mL IVPB        2 g 200 mL/hr over 30 Minutes Intravenous On call to O.R. 01/23/21 0959 01/23/21 1952   01/23/21 1157  sodium chloride 0.9 % with cefoTEtan (CEFOTAN) ADS Med       Note to Pharmacy: Devoria Glassing   : cabinet override  01/23/21 1157 01/23/21 1411   01/22/21 0600  ceFAZolin (ANCEF) IVPB 2g/100 mL premix        2 g 200 mL/hr over 30 Minutes Intravenous On call to O.R. 01/21/21 1747 01/22/21 0900   01/14/21 0830  cefTRIAXone (ROCEPHIN) 1 g in sodium chloride 0.9 % 100 mL IVPB        1 g 200 mL/hr over 30 Minutes Intravenous Every 24 hours 01/14/21 0735 01/16/21 1023   01/14/21 0800  fosfomycin (MONUROL) packet 3 g  Status:  Discontinued        3 g Oral  Once 01/14/21 1884 01/14/21 0734       Subjective: Patient seen and evaluated today with no new acute complaints or concerns. No acute concerns or events noted overnight.  She continues to receive TPN feeds via PICC line.   Objective: Vitals:   01/27/21 0600 01/27/21 0700 01/27/21 0743 01/27/21 0800  BP: (!)  157/78 (!) 163/80  (!) 156/76  Pulse:      Resp: 20 18  (!) 23  Temp:   98.4 F (36.9 C)   TempSrc:   Oral   SpO2:      Weight:      Height:        Intake/Output Summary (Last 24 hours) at 01/27/2021 0921 Last data filed at 01/27/2021 0600 Gross per 24 hour  Intake 1550.77 ml  Output 1370 ml  Net 180.77 ml   Filed Weights   01/26/21 0300 01/26/21 1223 01/27/21 0400  Weight: 52.3 kg 54.8 kg 53.2 kg    Examination:  General exam: Appears calm and comfortable  Respiratory system: Clear to auscultation. Respiratory effort normal. Cardiovascular system: S1 & S2 heard, RRR.  Gastrointestinal system: Abdomen with binder in place as well as G-tube with no drainage. Central nervous system: Alert and awake Extremities: No edema Skin: No significant lesions noted Psychiatry: Flat affect.    Data Reviewed: I have personally reviewed following labs and imaging studies  CBC: Recent Labs  Lab 01/23/21 0834 01/24/21 0335 01/25/21 0434 01/26/21 0432 01/27/21 0341  WBC 11.5* 7.8 9.3 8.1 5.6  NEUTROABS  --   --   --   --  5.2  HGB 9.8* 11.8* 9.6* 9.5* 8.8*  HCT 30.1* 37.7 30.5* 29.6* 28.1*  MCV 98.0 101.1* 100.7* 100.3* 99.6  PLT 172 161 154 164 155   Basic Metabolic Panel: Recent Labs  Lab 01/23/21 0834 01/24/21 0335 01/25/21 0434 01/26/21 0432 01/27/21 0341  NA 142 139 144 143 144  K 3.0* 3.9 3.5 3.4* 3.6  CL 107 107 110 109 109  CO2 26 26 28 29  32  GLUCOSE 127* 108* 120* 120* 186*  BUN 23 20 16 12 9   CREATININE 0.69 0.64 0.47 0.44 0.33*  CALCIUM 8.3* 8.2* 8.3* 8.2* 8.4*  MG 1.6* 2.1 1.8 1.8 1.7  PHOS  --   --   --   --  1.3*   GFR: Estimated Creatinine Clearance: 46.3 mL/min (A) (by C-G formula based on SCr of 0.33 mg/dL (L)). Liver Function Tests: Recent Labs  Lab 01/27/21 0341  AST 17  ALT 6  ALKPHOS 56  BILITOT 0.5  PROT 5.7*  ALBUMIN 1.9*   No results for input(s): LIPASE, AMYLASE in the last 168 hours. No results for input(s): AMMONIA in the last  168 hours. Coagulation Profile: No results for input(s): INR, PROTIME in the last 168 hours. Cardiac Enzymes: No results for input(s): CKTOTAL, CKMB, CKMBINDEX, TROPONINI in the last 168 hours.  BNP (last 3 results) No results for input(s): PROBNP in the last 8760 hours. HbA1C: No results for input(s): HGBA1C in the last 72 hours. CBG: Recent Labs  Lab 01/26/21 1708 01/26/21 1934 01/26/21 2340 01/27/21 0434 01/27/21 0740  GLUCAP 99 106* 141* 162* 188*   Lipid Profile: Recent Labs    01/27/21 0341  TRIG 108   Thyroid Function Tests: No results for input(s): TSH, T4TOTAL, FREET4, T3FREE, THYROIDAB in the last 72 hours. Anemia Panel: No results for input(s): VITAMINB12, FOLATE, FERRITIN, TIBC, IRON, RETICCTPCT in the last 72 hours. Sepsis Labs: Recent Labs  Lab 01/26/21 0432  PROCALCITON 0.93    Recent Results (from the past 240 hour(s))  Surgical pcr screen     Status: Abnormal   Collection Time: 01/22/21  8:16 AM   Specimen: Nasal Mucosa; Nasal Swab  Result Value Ref Range Status   MRSA, PCR POSITIVE (A) NEGATIVE Final    Comment: RESULT CALLED TO, READ BACK BY AND VERIFIED WITH: EMILY WRIGHT,RN @1152  01/22/2021 KAY    Staphylococcus aureus POSITIVE (A) NEGATIVE Final    Comment: RESULT CALLED TO, READ BACK BY AND VERIFIED WITH: EMILY WRIGHT,RN @1152  01/22/2021 KAY (NOTE) The Xpert SA Assay (FDA approved for NASAL specimens in patients 5 years of age and older), is one component of a comprehensive surveillance program. It is not intended to diagnose infection nor to guide or monitor treatment. Performed at [facility], [address], [place], [address] [place]          Radiology Studies: DG Chest 1 View  Result Date: 01/25/2021 CLINICAL DATA:  Increased shortness of breath. EXAM: CHEST  1 VIEW COMPARISON:  Radiograph 01/12/2019 FINDINGS: Lower lung volumes from prior exam. Normal heart size with stable mediastinal contours. Development of patchy  airspace disease in the right perihilar and right lung base. Minor left lung base atelectasis. Chronic elevation of left hemidiaphragm, accentuated by lower lung volumes. There may be a small right pleural effusion. No pneumothorax. There is mild residual barium in the upper abdomen from prior esophagram. IMPRESSION: Development of patchy airspace disease in the right perihilar and right lung base suspicious for pneumonia, aspiration is considered. Possible small right pleural effusion. Electronically Signed   By: 01/27/2021 M.D.   On: 01/25/2021 22:02   DG CHEST PORT 1 VIEW  Result Date: 01/26/2021 CLINICAL DATA:  PICC line placement EXAM: PORTABLE CHEST 1 VIEW COMPARISON:  01/25/2021 FINDINGS: Right upper extremity PICC line tip is seen within the superior cavoatrial junction. Right basilar pulmonary infiltrate again noted, compatible with infection or aspiration the appropriate clinical setting, has improved. No pneumothorax or pleural effusion. Cardiac size within normal limits. Pulmonary vascularity is normal. IMPRESSION: Right upper extremity PICC line tip within the superior cavoatrial junction. Improving right basilar consolidation. Electronically Signed   By: 03/28/2021 MD   On: 01/26/2021 19:02   Helyn Numbers EKG SITE RITE  Result Date: 01/26/2021 If Site Rite image not attached, placement could not be confirmed due to current cardiac rhythm.       Scheduled Meds:  chlorhexidine  15 mL Mouth Rinse BID   Chlorhexidine Gluconate Cloth  6 each Topical Daily   feeding supplement (OSMOLITE 1.2 CAL)  1,000 mL Per Tube Q24H   free water  30 mL Per Tube Q4H   insulin aspart  0-9 Units Subcutaneous Q8H   mouth rinse  15 mL Mouth Rinse q12n4p   metoprolol tartrate  2.5 mg Intravenous Q6H   mupirocin ointment  1  application Nasal BID   pantoprazole sodium  40 mg Per Tube BID   scopolamine  1 patch Transdermal Q72H   sodium chloride flush  10-40 mL Intracatheter Q12H   Continuous Infusions:   ampicillin-sulbactam (UNASYN) IV 3 g (01/27/21 0502)   lactated ringers with kcl 35 mL/hr at 01/26/21 2034   TPN ADULT (ION) 40 mL/hr at 01/26/21 2031     LOS: 14 days    Time spent: 35 minutes    Lamyah Creed Hoover Brunette Chayce Rullo, DO [facility]  If 7PM-7AM, please contact night-coverage [email or web address] 01/27/2021, 9:21 AM

## 2021-01-28 DIAGNOSIS — L8990 Pressure ulcer of unspecified site, unspecified stage: Secondary | ICD-10-CM | POA: Insufficient documentation

## 2021-01-28 DIAGNOSIS — K220 Achalasia of cardia: Secondary | ICD-10-CM | POA: Diagnosis not present

## 2021-01-28 DIAGNOSIS — Z7189 Other specified counseling: Secondary | ICD-10-CM | POA: Diagnosis not present

## 2021-01-28 DIAGNOSIS — Z515 Encounter for palliative care: Secondary | ICD-10-CM | POA: Diagnosis not present

## 2021-01-28 DIAGNOSIS — R1319 Other dysphagia: Secondary | ICD-10-CM

## 2021-01-28 DIAGNOSIS — R1010 Upper abdominal pain, unspecified: Secondary | ICD-10-CM | POA: Diagnosis not present

## 2021-01-28 DIAGNOSIS — K5901 Slow transit constipation: Secondary | ICD-10-CM

## 2021-01-28 LAB — GLUCOSE, CAPILLARY
Glucose-Capillary: 242 mg/dL — ABNORMAL HIGH (ref 70–99)
Glucose-Capillary: 232 mg/dL — ABNORMAL HIGH (ref 70–99)
Glucose-Capillary: 203 mg/dL — ABNORMAL HIGH (ref 70–99)
Glucose-Capillary: 187 mg/dL — ABNORMAL HIGH (ref 70–99)
Glucose-Capillary: 166 mg/dL — ABNORMAL HIGH (ref 70–99)

## 2021-01-28 MED ORDER — TRAVASOL 10 % IV SOLN
INTRAVENOUS | Status: AC
  Filled 2021-01-28: qty 768

## 2021-01-28 MED ORDER — INSULIN ASPART 100 UNIT/ML IJ SOLN
0.0000 [IU] | Freq: Three times a day (TID) | INTRAMUSCULAR | Status: DC
  Administered 2021-01-28 (×2): 3 [IU] via SUBCUTANEOUS
  Administered 2021-01-28 – 2021-01-29 (×2): 2 [IU] via SUBCUTANEOUS

## 2021-01-28 MED ORDER — INSULIN ASPART 100 UNIT/ML IJ SOLN: 0.0000 [IU] | Freq: Every day | INTRAMUSCULAR | Status: DC

## 2021-01-28 NOTE — Consult Note (Signed)
Subjective: Patient reports no abdominal pain today, states she is feeling a little better. She denies nausea or vomiting at this time, reports small bowel movement yesterday. Suctioning oral secretions some but not as often as she previously was.   Objective: Vital signs in last 24 hours: Temp:  [97.7 F (36.5 C)-98.4 F (36.9 C)] 97.7 F (36.5 C) (08/03 0800) Pulse Rate:  [86-105] 105 (08/02 2200) Resp:  [19-23] 21 (08/03 0500) BP: (160-178)/(71-110) 170/110 (08/03 0500) SpO2:  [97 %-100 %] 100 % (08/02 2200) Weight:  [52.4 kg] 52.4 kg (08/03 0356) Last BM Date: 01/27/21 General:   Alert and oriented, pleasant, cachectic  Head:  Normocephalic and atraumatic.Marland Kitchen  Heart:  S1, S2 present, no murmurs noted.  Lungs: Clear to auscultation bilaterally, without wheezing, rales, or rhonchi.  Abdomen:  Bowel sounds hypoactive but present, soft, non-tender, non-distended. Recent surgical incision present.  Msk:  Symmetrical without gross deformities. Normal posture. Pulses:  Normal pulses noted. Extremities:  Without clubbing or edema. Neurologic:  Alert and  oriented x4;  grossly normal neurologically. Skin:  Warm and dry, intact without significant lesions.  Psych:  Alert and cooperative. Normal mood and affect.  Intake/Output from previous day: 08/02 0701 - 08/03 0700 In: 2133 [I.V.:1814.8; IV Piggyback:318.2] Out: 3050 [Urine:2450; Drains:600] Intake/Output this shift: Total I/O In: -  Out: 950 [Urine:950]  Lab Results: Recent Labs    01/26/21 0432 01/27/21 0341  WBC 8.1 5.6  HGB 9.5* 8.8*  HCT 29.6* 28.1*  PLT 164 155   BMET Recent Labs    01/26/21 0432 01/27/21 0341  NA 143 144  K 3.4* 3.6  CL 109 109  CO2 29 32  GLUCOSE 120* 186*  BUN 12 9  CREATININE 0.44 0.33*  CALCIUM 8.2* 8.4*   LFT Recent Labs    01/27/21 0341  PROT 5.7*  ALBUMIN 1.9*  AST 17  ALT 6  ALKPHOS 56  BILITOT 0.5   Studies/Results: DG CHEST PORT 1 VIEW  Result Date:  01/26/2021 CLINICAL DATA:  PICC line placement EXAM: PORTABLE CHEST 1 VIEW COMPARISON:  01/25/2021 FINDINGS: Right upper extremity PICC line tip is seen within the superior cavoatrial junction. Right basilar pulmonary infiltrate again noted, compatible with infection or aspiration the appropriate clinical setting, has improved. No pneumothorax or pleural effusion. Cardiac size within normal limits. Pulmonary vascularity is normal. IMPRESSION: Right upper extremity PICC line tip within the superior cavoatrial junction. Improving right basilar consolidation. Electronically Signed   By: Helyn Numbers MD   On: 01/26/2021 19:02   [name] EKG SITE RITE  Result Date: 01/26/2021 If Site Rite image not attached, placement could not be confirmed due to current cardiac rhythm.   Assessment: 80 year-old female with PMH of HTN, Type II DM, GERD, positive ANA and SSA, chronic achalasia (2014 manometry showed possible type II), previously managed supportively, however, patient presented to ED on 7/19 with worsening regurgitation, increased oral secretions, dysphagia and poor oral intake with associated weight loss over the past 2 months.   EGD with Dr. Karilyn Cota on 7/21 that revealed hypertonic LES, large amount of fluid in stomach (suctioned). Botox injection performed at time of EGD, however, patient experienced little improvement from botox thereafter and was still experiencing severe dysphagia.  GI and surgery discussed case extensively, due to question of underlying motility issue, given colonic ileus discovered on xray as well as residual barium from esophagram 6 days prior, noted on abd xray on 7/26,  it was decided that open G-tube would  be the best option for patient at this time, with plan to transition to GJ tube after 1 week. G tube placed on 7/28 with partial small bowel resection due to adhesions. G-tube became dislodged and had to be replaced on 7/29. Tube feedings started on 7/30. Patient had episode of aspiration  on evening of 8/1 with chest xray consistent with aspiration pneumonia, which she is now being treated with Unasyn for. Tube feeds stopped and patient made NPO. PICC line placed  yesterday for TPN administration and plans for IR to exchange G-tube with GJ tube on 8/5.  Previous discussion of trialling motility agents to assist with suspected dysmotility, however, this will need further consideration after GJ tube is placed. It was also discussed previously that patient may have later referral for surgical management of achalasia with heller myotomy or POEM, however, it is unlikely patient would be a good candidate for surgery at this time, given her frailty. Underlying motility disorder would still be an issue even after correction of achalasia, therefore surgical correction of achalasia is unlikely to provide much benefit. We can readdress this once supplementation of nutrition is reestablished and possible trial of motility agents has been started.   Constipation: patient reports small bm yesterday, rectal disimpaction performed yesterday and fleet enema ordered. Can consider further agents for constipation such as Linzess per tube.    Plan: Tube feeds on hold r/t aspiration, PICC line placed yesterday and TPN started Likely IR consult on 8/5 to transition G-tube to GJ tube. Recommendations per general surgery Further consideration of motility agents for suspected underlying dysmotility can be reevaluated once GJ tube placed and successfully being used. Can reevaluate definitive achalasia management (Heller myotomy vs POEM) once GJ tube placed and patient is able to receive further supplemental nutrition Consider adding additional agent such as Linzess per G tube to help with constipation   LOS: 15 days    01/28/2021, 9:33 AM  [name] L. Jeanmarie Hubert, MSN, APRN, AGNP-C Adult-Gerontology Nurse Practitioner [facility]

## 2021-01-28 NOTE — Progress Notes (Signed)
PHARMACY - TOTAL PARENTERAL NUTRITION CONSULT NOTE   Indication: Intolerance to enteral feeding  Patient Measurements: Height: 5\' 4"  (162.6 cm) Weight: 52.4 kg (115 lb 8.3 oz) IBW/kg (Calculated) : 54.7 TPN AdjBW (KG): 49.7 Body mass index is 19.83 kg/m. Usual Weight:   Assessment: Patient aspirated overnight and tube feeds were discontinued- consulted to transition to TPN.  Plan is replacement w/ jejunostomy tube on 01/30/2021.  TPN advanced.  Glucose / Insulin: 167-242 ;  7 units insulin Electrolytes: WNL   K 3.6, Mg 1.7 Renal: stable Hepatic: WNL Intake / Output; MIVF: LR w/ potassium @ 75 mL/hr   Central access: PICC line being placed 8/1 TPN start date: 8/1  Nutritional Goals (per RD recommendation on 7/29): kCal: 1600-1795, Protein: 75-80 g, Fluid: 1.6-1.7 liters daily TPN provides 1670 kcal/day. Prot 76.8G; lipids 38.4g, and dextrose 288g  Current Nutrition:  Tube feeds- transitioning to TPN  Plan:  IContinueTPN to 80 mL/hr at 1800 Electrolytes in TPN: Na 35 mEq/L, K 28mEq/L, Ca 19mEq/L, Mg 108mEq/L, and Phos 77mmol/L. Cl:Ac 1:1 Add standard MVI and trace elements to TPN Initiate Sensitive q8h SSI and adjust as needed  Reduce MIVF to off at 1800 Monitor TPN labs on Mon/Thurs,   12m, BS Elder Cyphers, Loura Back Clinical Pharmacist Pager [phone] 01/28/2021 9:13 AM

## 2021-01-28 NOTE — Progress Notes (Signed)
Palliative: Kathryn [name] is lying quietly in bed in a dark room.  She greets me making and somewhat keeping eye contact.  She is alert and oriented, able to make her basic needs known.  There is no family at bedside at this time.  We talk in detail about the treatment plan including, but not limited to plan for advancing G-tube to GJ tube on Friday, if/when stabilized with tube feeding, discontinuing TPN, time for outcomes.  We also talked about CODE STATUS.  Kathryn [name] states that she was able to have discussion with her daughter Kathryn [name] and sister Kathryn [name].  She tells me today that she would like to continue to treat the treatable, seek somewhat aggressive care, but would not want to be on life support.  She is agreeable for me to call daughter to discuss.  Call to daughter, Worthy Rancher "Kathryn [name]" [place]. We talk about the current plan, no questions about procedure, outcomes or time frame.  We talk about Code status and Kathryn [name] shares that they have talked about CODE STATUS in the past, Mrs. Fredericks husband, Rainey's stepfather, was DNR.  She also endorses DNR status.  We talked about time for outcomes and Connye Burkitt shares her faith.  Kathryn [name] brings up short-term rehab.  She states that although family would like to bring her home, she is unsure about their ability.  I share that short-term rehab can often be a bridge to go home safely.  24/7 care with physical therapy, wound care and medicines can sometimes make it easier for people to return home in a better state.  Kathryn [name] shares that they are considering discharge options.  Conference with attending, bedside nursing staff, transition of care team related to patient condition, needs, goals of care, disposition.  Plan:   Continue to treat the treatable but no CPR or intubation, FULL SCOPE care otherwise.  Time for outcomes.  May qualify for short-term rehab versus home with home health  45 minutes  [name], NP Palliative medicine team Team phone [phone] Greater  than 50% of this time was spent counseling and coordinating care related to the above assessment and plan.

## 2021-01-28 NOTE — Progress Notes (Signed)
PROGRESS NOTE    [name]  PTW:[id number] DOB: [birth year] DOA: 01/13/2021 PCP: Suzan Slick, MD   Brief Narrative:   Kathryn [name] is a 80 y.o. female with medical history significant for  hypertension, type 2 diabetes mellitus, GERD, positive ANA and SSA autoantibodies, and chronic achalasia (2014 manometry showed abnormal motility)  who presents to the emergency department due to more than 2 months history of decreased oral intake, generalized weakness and about 3 to 4-week onset of abdominal pain associated with difficulty in being able to swallow (solid and liquid).  She believes that she must have lost up to 30 pounds since onset of symptoms.  Patient endorsed that she usually regurgitates any food/liquid ingested within 15 to 20 minutes of ingestion.  Abdominal pain was in the epigastric area and was sharp in nature, this worsens with food.  She denies fever, chills, chest pain, shortness of breath.   She was admitted to the hospital from 12/25/2020-12/28/2020, at that time barium p.o. esophagram done urgently as an outpatient was indicated.  She was also admitted from 10/23/2020 to 10/25/2020 during which patient presented with proximal muscle weakness with an elevated CPK of 1206 that was concerning for polymyalgia rheumatica. This was treated with prednisone with improvement.  EGD done on 10/04/2020 showed normal dilated esophagus and small hiatal hernia.   In the emergency department, she was intermittently tachypneic and tachycardic.  Other vital signs were within normal range.  Work-up in the ED showed normocytic anemia and leukocytosis, BMP was normal except for elevated BUN at and hyperglycemia with CBG of 135.  Albumin 3.2. CT of head without contrast showed no acute intracranial abnormality. IV hydration was provided, hospitalist was asked to admit patient for further evaluation and management.  -Patient continued to experience significant epigastric pain and underwent EGD with  Botox treatment on 7/21.  She continued to have difficulty swallowing and plans were being made for PEG placement which patient was agreeable to.  She underwent open gastrostomy tube placement on 7/28 and then had dislodgment on 7/29 which required subsequent placement on that date.  She started on trickle feeds on 7/30 and feeds were being advanced, but unfortunately on 8/1 she had recurrent aspiration and is now on Unasyn for coverage.  Tube feeds had to be discontinued and plans are now for conversion to GJ tube on 8/5 per IR.  In the meantime, she remains on TPN feeds via PICC line.  Assessment & Plan:   Principal Problem:   Abdominal pain Active Problems:   HTN (hypertension)   Dysphagia   Nausea and vomiting   Generalized weakness   Dehydration   Normocytic anemia   Leukocytosis   Hyperglycemia   Hypoalbuminemia due to protein-calorie malnutrition (HCC)   Prolonged QT interval   Achalasia   Malnutrition of moderate degree   Hypoactive bowel sounds   Dislodged gastrostomy tube   Constipation   Pressure injury of skin   Epigastric/Abdominal pain, nausea and vomiting in the setting of esophageal dysphagia Patient is ANA/SSA antibody positive. Sjogren's syndrome and SLE are on the differential. Patient also has had increased oral secretion, suggestive of sicca-related symptoms. Gastrointestinal involvement of SLE can manifest as esophageal motility disorder, such as achalasia, which has been suggesting in this patient. Other GI manifestations of SLE include acute pancreatitis, for which  patient signs and symptoms are concerning, but CT 6/30 has shown no acute pancreatic findings. EGD in April 2022 showed unremarkable esophagus, and patient underwent dialtion given  history of dysphagia, but patient states that did not help with symptoms.   -Continue IV morphine 0.5 mg q.4h p.r.n. for severe pain -Continue Protonix 40 mg po BID -Continue clear liquid diet with plan to advanced diet as  tolerated -Gastroenterology following for further recommendations -ongoing goals of care discussions with palliative team, patient and daughter regarding tube feeding -Patient has undergone G-tube placement on 7/28 with dislodgment noted on 7/29 which has undergone replacement -She unfortunately developed aspiration pneumonia overnight and is on Unasyn now for coverage.  Tube feeds have been discontinued.  PICC line placed 8/1 and TPN initiated.  Plan for GJ tube conversion on 8/5. -She had fecal impaction on 8/1 that was mechanically disimpacted. -Denies abdominal pain has agree to proceed GJ tube conversion placement by IR on 01/30/2021.   Aspiration pneumonia -Cover with Unasyn and monitor, currently day 3/7 -No requiring oxygen supplementation no using accessory muscles.   Generalized weakness and dehydration in the setting of above -Hypoalbuminemia secondary to mild protein calorie malnutrition -Continue IV hydration as described above -Continue protein supplement -surgery consult for PEG placement likely 7/29   Urinary tract infection - TREATED - UA in the ED showed many bacteria, moderate ketones, nitrites -Patient has completed IV antibiotic therapy on 01/17/2021.  Normocytic anemia -Chronic and Stable   COVID-19 infection (incidental finding) -Patient is asymptomatic and stable on room air -This was already diagnosed during last admission   Essential hypertension -Continue Lopressor but since can't take tabs now, we changed it to IV lopressor 2.5 mg Q6h until she can take the tablets again  Hyperglycemia secondary to T2DM -Hemoglobin A1c on 6/30-5.8%   GERD -Continue Protonix     DVT prophylaxis:SCD Code Status: DNR Family Communication: Daughter on phone 8/1, sister-in-law [name] on phone Disposition Plan:  Status is: Inpatient   Remains inpatient appropriate because:Ongoing diagnostic testing needed not appropriate for outpatient work up and IV treatments  appropriate due to intensity of illness or inability to take PO   Dispo: The patient is from: Home              Anticipated d/c is to: SNF              Patient currently is not medically stable to d/c.              Difficult to place patient No   Consultants:  Gastroenterology Palliative care Surgery (regarding PEG placement) IR   Procedures:  Barium swallow 7/20 EGD with botox injection 7/21 Open gastrostomy tube placement with lysis of adhesions and small bowel resection 7/28 Repeat placement of open gastrostomy tube on 7/29 PICC line for TPN 8/1  Antimicrobials:  Anti-infectives (From admission, onward)    Start     Dose/Rate Route Frequency Ordered Stop   01/26/21 0100  Ampicillin-Sulbactam (UNASYN) 3 g in sodium chloride 0.9 % 100 mL IVPB        3 g 200 mL/hr over 30 Minutes Intravenous Every 6 hours 01/26/21 0009     01/26/21 0100  doxycycline (VIBRAMYCIN) 100 mg in sodium chloride 0.9 % 250 mL IVPB  Status:  Discontinued        100 mg 125 mL/hr over 120 Minutes Intravenous Every 12 hours 01/26/21 0009 01/26/21 0745   01/23/21 1300  cefoTEtan (CEFOTAN) 2 g in sodium chloride 0.9 % 100 mL IVPB        2 g 200 mL/hr over 30 Minutes Intravenous On call to O.R. 01/23/21 4098 01/23/21 1952  01/23/21 1157  sodium chloride 0.9 % with cefoTEtan (CEFOTAN) ADS Med       Note to Pharmacy: Devoria GlassingMoore, Kimberly   : cabinet override      01/23/21 1157 01/23/21 1411   01/22/21 0600  ceFAZolin (ANCEF) IVPB 2g/100 mL premix        2 g 200 mL/hr over 30 Minutes Intravenous On call to O.R. 01/21/21 1747 01/22/21 0900   01/14/21 0830  cefTRIAXone (ROCEPHIN) 1 g in sodium chloride 0.9 % 100 mL IVPB        1 g 200 mL/hr over 30 Minutes Intravenous Every 24 hours 01/14/21 0735 01/16/21 1023   01/14/21 0800  fosfomycin (MONUROL) packet 3 g  Status:  Discontinued        3 g Oral  Once 01/14/21 0711 01/14/21 0734       Subjective: Patient seen and examined; in no acute distress.  No nausea,  no vomiting, no fever.  Afebrile with good oxygen saturations on room air.  Continue to receive TPN feeds via PICC line; no overnight events.  Objective: Vitals:   01/28/21 1500 01/28/21 1600 01/28/21 1659 01/28/21 1700  BP: (!) 155/69 (!) 163/85  (!) 167/91  Pulse:  (!) 118    Resp: 19 (!) 30  19  Temp:   98.7 F (37.1 C)   TempSrc:   Axillary   SpO2:  100%    Weight:      Height:        Intake/Output Summary (Last 24 hours) at 01/28/2021 1822 Last data filed at 01/28/2021 1800 Gross per 24 hour  Intake 2495.18 ml  Output 3395 ml  Net -899.82 ml   Filed Weights   01/26/21 1223 01/27/21 0400 01/28/21 0356  Weight: 54.8 kg 53.2 kg 52.4 kg    Examination: General exam: Alert, awake, oriented x 3; afebrile, no chest pain, no nausea, no vomiting. Respiratory system: Clear to auscultation. Respiratory effort normal. Cardiovascular system:RRR. No rubs, no gallops.  No JVD. Gastrointestinal system: Abdomen is nondistended, binder in place; decreased breath sounds appreciated.  No guarding. Central nervous system: Alert and oriented. No focal neurological deficits. Extremities: No cyanosis or clubbing. Skin: No petechiae. Psychiatry: Judgement and insight appear normal. Mood & affect appropriate.   Data Reviewed: I have personally reviewed following labs and imaging studies  CBC: Recent Labs  Lab 01/23/21 0834 01/24/21 0335 01/25/21 0434 01/26/21 0432 01/27/21 0341  WBC 11.5* 7.8 9.3 8.1 5.6  NEUTROABS  --   --   --   --  5.2  HGB 9.8* 11.8* 9.6* 9.5* 8.8*  HCT 30.1* 37.7 30.5* 29.6* 28.1*  MCV 98.0 101.1* 100.7* 100.3* 99.6  PLT 172 161 154 164 155   Basic Metabolic Panel: Recent Labs  Lab 01/23/21 0834 01/24/21 0335 01/25/21 0434 01/26/21 0432 01/27/21 0341  NA 142 139 144 143 144  K 3.0* 3.9 3.5 3.4* 3.6  CL 107 107 110 109 109  CO2 26 26 28 29  32  GLUCOSE 127* 108* 120* 120* 186*  BUN 23 20 16 12 9   CREATININE 0.69 0.64 0.47 0.44 0.33*  CALCIUM 8.3* 8.2*  8.3* 8.2* 8.4*  MG 1.6* 2.1 1.8 1.8 1.7  PHOS  --   --   --   --  1.3*   GFR: Estimated Creatinine Clearance: 45.6 mL/min (A) (by C-G formula based on SCr of 0.33 mg/dL (L)).  Liver Function Tests: Recent Labs  Lab 01/27/21 0341  AST 17  ALT 6  ALKPHOS 56  BILITOT 0.5  PROT 5.7*  ALBUMIN 1.9*   CBG: Recent Labs  Lab 01/27/21 2349 01/28/21 0351 01/28/21 0812 01/28/21 1124 01/28/21 1642  GLUCAP 208* 242* 232* 203* 187*   Lipid Profile: Recent Labs    01/27/21 0341  TRIG 108    Sepsis Labs: Recent Labs  Lab 01/26/21 0432  PROCALCITON 0.93    Recent Results (from the past 240 hour(s))  Surgical pcr screen     Status: Abnormal   Collection Time: 01/22/21  8:16 AM   Specimen: Nasal Mucosa; Nasal Swab  Result Value Ref Range Status   MRSA, PCR POSITIVE (A) NEGATIVE Final    Comment: RESULT CALLED TO, READ BACK BY AND VERIFIED WITH: EMILY WRIGHT,RN @1152  01/22/2021 KAY    Staphylococcus aureus POSITIVE (A) NEGATIVE Final    Comment: RESULT CALLED TO, READ BACK BY AND VERIFIED WITH: EMILY WRIGHT,RN @1152  01/22/2021 KAY (NOTE) The Xpert SA Assay (FDA approved for NASAL specimens in patients 14 years of age and older), is one component of a comprehensive surveillance program. It is not intended to diagnose infection nor to guide or monitor treatment. Performed at [facility], [address] [place], [address] [place]      Radiology Studies: No results found.   Scheduled Meds:  chlorhexidine  15 mL Mouth Rinse BID   Chlorhexidine Gluconate Cloth  6 each Topical Daily   free water  30 mL Per Tube Q4H   insulin aspart  0-5 Units Subcutaneous QHS   insulin aspart  0-9 Units Subcutaneous TID WC   mouth rinse  15 mL Mouth Rinse q12n4p   metoprolol tartrate  2.5 mg Intravenous Q6H   pantoprazole sodium  40 mg Per Tube BID   scopolamine  1 patch Transdermal Q72H   sodium chloride flush  10-40 mL Intracatheter Q12H   Continuous Infusions:   ampicillin-sulbactam (UNASYN) IV Stopped (01/28/21 1316)   TPN ADULT (ION) 80 mL/hr at 01/28/21 1739     LOS: 15 days    Time spent: 30 minutes    03/30/21, MD [facility]  If 7PM-7AM, please contact night-coverage [email or web address] 01/28/2021, 6:22 PM

## 2021-01-28 NOTE — Consult Note (Addendum)
Chief Complaint: Patient was seen in consultation today for conversion of G tube to GJ Chief Complaint  Patient presents with   Abdominal Pain   at the request of Dr Baltazar Apo  Referring Physician(s): Dr Vladimir Crofts  Supervising Physician: Marliss Coots  Patient Status: AP IP  History of Present Illness: Kathryn [name] is a 80 y.o. female    HTN; DM; GERD Chronic achalasia Presented to ED with worsening regurgitation; increased oral secretions; dysphagia Poor oral intake; significant wt loss EGD and Botox 7/21- no real improvement Gastro intestinal motility disorder Aspiration G tube placed by Surgery 01/22/21--partial small bowel resection while in OR for adhesions Continued aspiration after trickle feeds Now with aspiration pneumonia Tube feeds stopped - now with PICC for TPN Request for G tube to be converted to GJ tube per GI and Surgery teams  GI note 8/2: Dr Levon Hedger The patient has presented clinical stability.  Suspected diffuse gastrointestinal motility disorder as she had aspiration after being on tube feeds.  Currently on TPN tolerating adequately.  She is awaiting IR intervention to convert G-tube to GJ tube and will assess if she is able to tolerate a motility agent such as prucalopride or Reglan.  As she has not presented any bowel movements she can start taking low-dose MiraLAX 17 g/day but it will be important to note that she may not have too much output as she is not eating at the moment.  Scheduled in IR at [facility] 8/5  Past Medical History:  Diagnosis Date   Acid reflux    Diabetes (HCC)    controlled with diet.    HTN (hypertension)     Past Surgical History:  Procedure Laterality Date   ABDOMINAL HYSTERECTOMY     BOTOX INJECTION N/A 01/15/2021   Procedure: BOTOX INJECTION;  Surgeon: Lanelle Bal, DO;  Location: [facility];  Service: Endoscopy;  Laterality: N/A;   BOWEL RESECTION N/A 01/22/2021   Procedure: SMALL BOWEL RESECTION;   Surgeon: Lucretia Roers, MD;  Location: [facility] OR[facility];  Service: General;  Laterality: N/A;   CHOLECYSTECTOMY     ESOPHAGEAL DILATION N/A 10/24/2020   Procedure: ESOPHAGEAL DILATION;  Surgeon: Corbin Ade, MD;  Location: [facility];  Service: Endoscopy;  Laterality: N/A;   ESOPHAGOGASTRODUODENOSCOPY N/A 10/24/2020   Procedure: ESOPHAGOGASTRODUODENOSCOPY (EGD);  Surgeon: Corbin Ade, MD;  Location: [facility];  Service: Endoscopy;  Laterality: N/A;   ESOPHAGOGASTRODUODENOSCOPY (EGD) WITH PROPOFOL N/A 01/15/2021   Procedure: ESOPHAGOGASTRODUODENOSCOPY (EGD) WITH PROPOFOL;  Surgeon: Lanelle Bal, DO;  [facility];  Service: Endoscopy;  Laterality: N/A;   GASTROSTOMY N/A 01/23/2021   Procedure: REINSERTION OF GASTROSTOMY TUBE;  Surgeon: Lucretia Roers, MD;  Location: [facility];  Service: General;  Laterality: N/A;   GASTROSTOMY N/A 01/22/2021   Procedure: INSERTION OF GASTROSTOMY TUBE;  Surgeon: Lucretia Roers, MD;  Location: [facility];  Service: General;  Laterality: N/A;   Histosalpingogram     LYSIS OF ADHESION  01/22/2021   Procedure: LYSIS OF ADHESION;  Surgeon: Lucretia Roers, MD;  Location: [facility];  Service: General;;    Allergies: Cyclobenzaprine hcl  Medications: Prior to Admission medications   Medication Sig Start Date End Date Taking? Authorizing Provider  losartan (COZAAR) 50 MG tablet Take 50 mg by mouth daily. 09/26/20  Yes [provider]  metoprolol tartrate (LOPRESSOR) 25 MG tablet Take 1 tablet (25 mg total) by mouth 2 (two) times daily. 10/25/20  Yes Memon,  Durward Mallard, MD  metoprolol tartrate (LOPRESSOR) 25 MG tablet Take by mouth. 12/30/20  Yes [provider]  pantoprazole (PROTONIX) 40 MG tablet Take 40 mg by mouth daily.   Yes [provider]  triamcinolone ointment (KENALOG) 0.1 % Apply topically. 09/26/20  Yes [provider]  aspirin 81 MG tablet Take 81 mg by mouth daily. Patient not taking: No sig reported     [provider]     Family History  Problem Relation Age of Onset   Cancer Other    Diabetes Other    Arthritis Other    Colon cancer Father        diagnosed in his late 107s.   Esophageal cancer Neg Hx    Stomach cancer Neg Hx     Social History   Socioeconomic History   Marital status: Widowed    Spouse name: Not on file   Number of children: Not on file   Years of education: Not on file   Highest education level: Not on file  Occupational History   Not on file  Tobacco Use   Smoking status: Never   Smokeless tobacco: Never  Vaping Use   Vaping Use: Never used  Substance and Sexual Activity   Alcohol use: No   Drug use: No   Sexual activity: Never  Other Topics Concern   Not on file  Social History Narrative   Not on file   Social Determinants of Health   Financial Resource Strain: Not on file  Food Insecurity: Not on file  Transportation Needs: Not on file  Physical Activity: Not on file  Stress: Not on file  Social Connections: Not on file     Review of Systems: A 12 point ROS discussed and pertinent positives are indicated in the HPI above.  All other systems are negative.    Vital Signs: BP (!) 170/110   Pulse (!) 105   Temp 98.3 F (36.8 C) (Oral)   Resp (!) 21   Ht 5\' 4"  (1.626 m)   Wt 115 lb 8.3 oz (52.4 kg)   SpO2 100%   BMI 19.83 kg/m   Physical Exam HENT:     Mouth/Throat:     Mouth: Mucous membranes are moist.  Cardiovascular:     Rate and Rhythm: Regular rhythm. Tachycardia present.  Pulmonary:     Effort: Pulmonary effort is normal.     Breath sounds: Normal breath sounds.  Abdominal:     Palpations: Abdomen is soft.     Tenderness: There is no abdominal tenderness.     Comments: G tube in place NT no bleeding Binder in place  Skin:    General: Skin is warm.  Neurological:     Mental Status: She is alert and oriented to person, place, and time.     Comments: She is able to answer questions Knows DOB; Name   Psychiatric:     Comments: Discussed procedure with Dtr Kathryn [name] via phone    Imaging: DG Chest 1 View  Result Date: 01/25/2021 CLINICAL DATA:  Increased shortness of breath. EXAM: CHEST  1 VIEW COMPARISON:  Radiograph 01/12/2019 FINDINGS: Lower lung volumes from prior exam. Normal heart size with stable mediastinal contours. Development of patchy airspace disease in the right perihilar and right lung base. Minor left lung base atelectasis. Chronic elevation of left hemidiaphragm, accentuated by lower lung volumes. There may be a small right pleural effusion. No pneumothorax. There is mild residual barium in the upper abdomen from  prior esophagram. IMPRESSION: Development of patchy airspace disease in the right perihilar and right lung base suspicious for pneumonia, aspiration is considered. Possible small right pleural effusion. Electronically Signed   By: Narda Rutherford M.D.   On: 01/25/2021 22:02   DG Abd 1 View  Result Date: 01/20/2021 CLINICAL DATA:  Abdominal pain. EXAM: ABDOMEN - 1 VIEW COMPARISON:  CT of the abdomen on 12/04/2020, esophagram on 01/14/2021 FINDINGS: There is some residual barium in the stomach from recent esophagram. There is some gaseous distension of the colon in the region of the transverse colon and there may be a component of colonic ileus. No small bowel dilatation or signs of free air. No abnormal calcifications. Clips related to prior cholecystectomy. Degenerative disc disease of the lumbar spine. IMPRESSION: Potential colonic ileus. Residual barium in the stomach related to recent esophagram 6 days ago. This may implicate some degree poor gastric motility/emptying. Electronically Signed   By: Irish Lack M.D.   On: 01/20/2021 12:26   CT Head Wo Contrast  Result Date: 01/13/2021 CLINICAL DATA:  Delirium. EXAM: CT HEAD WITHOUT CONTRAST TECHNIQUE: Contiguous axial images were obtained from the base of the skull through the vertex without intravenous contrast.  COMPARISON:  None. FINDINGS: Brain: No evidence of acute infarction, hemorrhage, hydrocephalus, extra-axial collection or mass lesion/mass effect. Vascular: No hyperdense vessel or unexpected calcification. Skull: Normal. Negative for fracture or focal lesion. Sinuses/Orbits: No acute finding. Other: None. IMPRESSION: No acute intracranial abnormality seen. Electronically Signed   By: Lupita Raider M.D.   On: 01/13/2021 20:34   DG CHEST PORT 1 VIEW  Result Date: 01/26/2021 CLINICAL DATA:  PICC line placement EXAM: PORTABLE CHEST 1 VIEW COMPARISON:  01/25/2021 FINDINGS: Right upper extremity PICC line tip is seen within the superior cavoatrial junction. Right basilar pulmonary infiltrate again noted, compatible with infection or aspiration the appropriate clinical setting, has improved. No pneumothorax or pleural effusion. Cardiac size within normal limits. Pulmonary vascularity is normal. IMPRESSION: Right upper extremity PICC line tip within the superior cavoatrial junction. Improving right basilar consolidation. Electronically Signed   By: Helyn Numbers MD   On: 01/26/2021 19:02   [name] EKG SITE RITE  Result Date: 01/26/2021 If Site Rite image not attached, placement could not be confirmed due to current cardiac rhythm.  DG ESOPHAGUS W DOUBLE CM (HD)  Result Date: 01/14/2021 CLINICAL DATA:  Achalasia by prior esophageal manometry, dysphagia, regurgitation EXAM: ESOPHOGRAM / BARIUM SWALLOW / BARIUM TABLET STUDY TECHNIQUE: Combined double contrast and single contrast examination performed using effervescent crystals, thick barium liquid, and thin barium liquid. 12.5 mm diameter barium tablet was not administered. FLUOROSCOPY TIME:  Fluoroscopy Time:  3 minutes 18 seconds Radiation Exposure Index (if provided by the fluoroscopic device): 55.3 mGy Number of Acquired Spot Images: multiple fluoroscopic screen captures COMPARISON:  None FINDINGS: Patient unable to safely stand for upright imaging. Study  performed horizontal up to 40 degrees of head elevation. Thoracic esophagus appears dilated with relative narrowing of the gastroesophageal junction. Extremely poor esophageal motility with very weak to absent primary peristaltic waves, occasional secondary waves, and multiple non propulsive tertiary waves. Prolonged retention of contrast within thoracic esophagus despite elevation of table 40 degrees. Questionable linear filling defect at distal esophagus. Suspected ulcer along LEFT anterolateral aspect of the distal esophagus (RF#3 image 182/199 and RF#4 image 157/157)). No definite hiatal hernia. Focal area of narrowing at the cervical esophagus as well, though this could be relative related to distention of remainder of esophagus. No  definite aspiration of contrast. At AP window, a potential small diverticulum or less likely additional ulcer is seen, RF#5 image109/127, RF#1 image 3/85. IMPRESSION: Dilated thoracic esophagus with extremely poor motility and relative narrowing of the GE junction, suggestive of achalasia. Probable ulcer at distal esophagus with additional diverticulum versus less likely ulcer at the level of the AP window. Questionable linear filling defect at distal esophagus such as a web or related to an ulcer. Electronically Signed   By: Ulyses SouthwardMark  Boles M.D.   On: 01/14/2021 12:47    Labs:  CBC: Recent Labs    01/24/21 0335 01/25/21 0434 01/26/21 0432 01/27/21 0341  WBC 7.8 9.3 8.1 5.6  HGB 11.8* 9.6* 9.5* 8.8*  HCT 37.7 30.5* 29.6* 28.1*  PLT 161 154 164 155    COAGS: Recent Labs    01/14/21 0409  INR 1.0  APTT 28    BMP: Recent Labs    01/24/21 0335 01/25/21 0434 01/26/21 0432 01/27/21 0341  NA 139 144 143 144  K 3.9 3.5 3.4* 3.6  CL 107 110 109 109  CO2 26 28 29  32  GLUCOSE 108* 120* 120* 186*  BUN 20 16 12 9   CALCIUM 8.2* 8.3* 8.2* 8.4*  CREATININE 0.64 0.47 0.44 0.33*  GFRNONAA >60 >60 >60 >60    LIVER FUNCTION TESTS: Recent Labs    01/13/21 2014  01/14/21 0409 01/20/21 0413 01/27/21 0341  BILITOT 1.0 0.7 1.1 0.5  AST 27 23 27 17   ALT 15 14 15 6   ALKPHOS 54 55 65 56  PROT 8.0 7.7 7.4 5.7*  ALBUMIN 3.2* 3.1* 2.9* 1.9*    TUMOR MARKERS: No results for input(s): AFPTM, CEA, CA199, CHROMGRNA in the last 8760 hours.  Assessment and Plan:  Chronic achalasia Worsening regurgitation and vomiting Wt loss EGD and Botox but no help G tube placed in OR with Dr Henreitta LeberBridges 7/28 Continued aspiration with G tube--- aspiration pneumonia Scheduled now for Gastric tube to gastro-jejunal tube conversion in IR [facility] 8/5 [facility] and [facility] after procedure. Pt and dtr Winnifred are aware of procedure benefits and risks. Discussed with daughter via phone. Agreeable to proceed Consent signed and in chart   Thank you for this interesting consult.  I greatly enjoyed meeting Kathryn [name] and look forward to participating in their care.  A copy of this report was sent to the requesting provider on this date.  Electronically Signed: Robet LeuPamela A Alden Bensinger, PA-C 01/28/2021, 7:57 AM   I spent a total of 20 Minutes    in face to face in clinical consultation, greater than 50% of which was counseling/coordinating care for conversion of G tube to GJ tube

## 2021-01-29 DIAGNOSIS — R1310 Dysphagia, unspecified: Secondary | ICD-10-CM | POA: Diagnosis not present

## 2021-01-29 DIAGNOSIS — K5900 Constipation, unspecified: Secondary | ICD-10-CM | POA: Diagnosis not present

## 2021-01-29 DIAGNOSIS — K220 Achalasia of cardia: Secondary | ICD-10-CM | POA: Diagnosis not present

## 2021-01-29 DIAGNOSIS — R1010 Upper abdominal pain, unspecified: Secondary | ICD-10-CM | POA: Diagnosis not present

## 2021-01-29 DIAGNOSIS — Z7189 Other specified counseling: Secondary | ICD-10-CM | POA: Diagnosis not present

## 2021-01-29 DIAGNOSIS — Z515 Encounter for palliative care: Secondary | ICD-10-CM | POA: Diagnosis not present

## 2021-01-29 LAB — COMPREHENSIVE METABOLIC PANEL
ALT: 19 U/L (ref 0–44)
AST: 35 U/L (ref 15–41)
Albumin: 1.9 g/dL — ABNORMAL LOW (ref 3.5–5.0)
Alkaline Phosphatase: 92 U/L (ref 38–126)
Anion gap: 7 (ref 5–15)
BUN: 13 mg/dL (ref 8–23)
CO2: 24 mmol/L (ref 22–32)
Calcium: 8.5 mg/dL — ABNORMAL LOW (ref 8.9–10.3)
Chloride: 102 mmol/L (ref 98–111)
Creatinine, Ser: 0.34 mg/dL — ABNORMAL LOW (ref 0.44–1.00)
GFR, Estimated: >60 mL/min (ref >60)
Glucose, Bld: 230 mg/dL — ABNORMAL HIGH (ref 70–99)
Potassium: 4.3 mmol/L (ref 3.5–5.1)
Sodium: 133 mmol/L — ABNORMAL LOW (ref 135–145)
Total Bilirubin: 0.5 mg/dL (ref 0.3–1.2)
Total Protein: 6 g/dL — ABNORMAL LOW (ref 6.5–8.1)

## 2021-01-29 LAB — GLUCOSE, CAPILLARY
Glucose-Capillary: 162 mg/dL — ABNORMAL HIGH (ref 70–99)
Glucose-Capillary: 185 mg/dL — ABNORMAL HIGH (ref 70–99)
Glucose-Capillary: 208 mg/dL — ABNORMAL HIGH (ref 70–99)
Glucose-Capillary: 211 mg/dL — ABNORMAL HIGH (ref 70–99)
Glucose-Capillary: 214 mg/dL — ABNORMAL HIGH (ref 70–99)
Glucose-Capillary: 252 mg/dL — ABNORMAL HIGH (ref 70–99)

## 2021-01-29 LAB — PHOSPHORUS: Phosphorus: 3.1 mg/dL (ref 2.5–4.6)

## 2021-01-29 LAB — MAGNESIUM: Magnesium: 2.1 mg/dL (ref 1.7–2.4)

## 2021-01-29 MED ORDER — INSULIN ASPART 100 UNIT/ML IJ SOLN
0.0000 [IU] | INTRAMUSCULAR | Status: DC
  Administered 2021-01-29: 3 [IU] via SUBCUTANEOUS
  Administered 2021-01-29: 2 [IU] via SUBCUTANEOUS
  Administered 2021-01-29 – 2021-01-30 (×4): 3 [IU] via SUBCUTANEOUS
  Administered 2021-01-30: 2 [IU] via SUBCUTANEOUS
  Administered 2021-01-31: 3 [IU] via SUBCUTANEOUS
  Administered 2021-01-31: 2 [IU] via SUBCUTANEOUS
  Administered 2021-01-31: 1 [IU] via SUBCUTANEOUS
  Administered 2021-01-31: 2 [IU] via SUBCUTANEOUS
  Administered 2021-02-01: 1 [IU] via SUBCUTANEOUS
  Administered 2021-02-01: 3 [IU] via SUBCUTANEOUS

## 2021-01-29 MED ORDER — TRAVASOL 10 % IV SOLN
INTRAVENOUS | Status: AC
  Filled 2021-01-29: qty 768

## 2021-01-29 NOTE — Progress Notes (Addendum)
Inpatient Diabetes Program Recommendations  AACE/ADA: New Consensus Statement on Inpatient Glycemic Control   Target Ranges:  Prepandial:   less than 140 mg/dL      Peak postprandial:   less than 180 mg/dL (1-2 hours)      Critically ill patients:  140 - 180 mg/dL   Results for Kathryn [name], Kathryn [name] (MRN [id number]) as of 01/29/2021 08:09  Ref. Range 01/28/2021 08:12 01/28/2021 11:24 01/28/2021 16:42 01/28/2021 20:42 01/29/2021 00:21 01/29/2021 03:52 01/29/2021 07:32  Glucose-Capillary Latest Ref Range: 70 - 99 mg/dL 751 (H) 700 (H) 174 (H) 166 (H) 208 (H) 252 (H) 185 (H)    Review of Glycemic Control  Current orders for Inpatient glycemic control: Novolog 0-9 units TID with meals, Novolog 0-5 units QHS; TPN @ 80 ml/hr  Inpatient Diabetes Program Recommendations:    Insulin: Please consider changing CBGs to Q4H and Novolog correction to 0-9 units Q4H.  Thanks, Orlando Penner, RN, MSN, CDE Diabetes Coordinator Inpatient Diabetes Program [phone] (Team Pager from 8am to 5pm)

## 2021-01-29 NOTE — Progress Notes (Signed)
Palliative: Chart review completed.  [name] is to go to interventional radiology at main campus on 8/5 for advancement of G-tube to GJ tube.  At this point patient and daughter, Kathryn [name], are open to further treatment as offered.  They have shared their worry that [name] would not be strong enough for any future surgeries.  Although daughter Kathryn [name] and sister Kathryn [name] would like to take her home, they realize that she may need a few weeks in short-term rehab.    Conference with attending and transition of care team related to patient condition, needs, goals of care.   PMT to continue to follow.  Plan: Continue to treat the treatable but no CPR or intubation.  Time for outcomes.  Would benefit from outpatient palliative.  15 minutes Kathryn Carmel, NP Palliative medicine team Team phone [phone] Greater than 50% of this time was spent counseling and coordinating care related to the above assessment and plan.

## 2021-01-29 NOTE — Progress Notes (Signed)
PROGRESS NOTE    Kathryn [name]  PTW:[id number] DOB: [birth year] DOA: 01/13/2021 PCP: Suzan Slick, MD   Brief Narrative:   Kathryn [name] is a 80 y.o. female with medical history significant for  hypertension, type 2 diabetes mellitus, GERD, positive ANA and SSA autoantibodies, and chronic achalasia (2014 manometry showed abnormal motility)  who presents to the emergency department due to more than 2 months history of decreased oral intake, generalized weakness and about 3 to 4-week onset of abdominal pain associated with difficulty in being able to swallow (solid and liquid).  She believes that she must have lost up to 30 pounds since onset of symptoms.  Patient endorsed that she usually regurgitates any food/liquid ingested within 15 to 20 minutes of ingestion.  Abdominal pain was in the epigastric area and was sharp in nature, this worsens with food.  She denies fever, chills, chest pain, shortness of breath.   She was admitted to the hospital from 12/25/2020-12/28/2020, at that time barium p.o. esophagram done urgently as an outpatient was indicated.  She was also admitted from 10/23/2020 to 10/25/2020 during which patient presented with proximal muscle weakness with an elevated CPK of 1206 that was concerning for polymyalgia rheumatica. This was treated with prednisone with improvement.  EGD done on 10/04/2020 showed normal dilated esophagus and small hiatal hernia.   In the emergency department, she was intermittently tachypneic and tachycardic.  Other vital signs were within normal range.  Work-up in the ED showed normocytic anemia and leukocytosis, BMP was normal except for elevated BUN at and hyperglycemia with CBG of 135.  Albumin 3.2. CT of head without contrast showed no acute intracranial abnormality. IV hydration was provided, hospitalist was asked to admit patient for further evaluation and management.  -Patient continued to experience significant epigastric pain and underwent EGD with  Botox treatment on 7/21.  She continued to have difficulty swallowing and plans were being made for PEG placement which patient was agreeable to.  She underwent open gastrostomy tube placement on 7/28 and then had dislodgment on 7/29 which required subsequent placement on that date.  She started on trickle feeds on 7/30 and feeds were being advanced, but unfortunately on 8/1 she had recurrent aspiration and is now on Unasyn for coverage.  Tube feeds had to be discontinued and plans are now for conversion to GJ tube on 8/5 per IR.  In the meantime, she remains on TPN feeds via PICC line.  Assessment & Plan:   Principal Problem:   Abdominal pain Active Problems:   HTN (hypertension)   Dysphagia   Nausea and vomiting   Generalized weakness   Dehydration   Normocytic anemia   Leukocytosis   Hyperglycemia   Hypoalbuminemia due to protein-calorie malnutrition (HCC)   Prolonged QT interval   Achalasia   Malnutrition of moderate degree   Hypoactive bowel sounds   Dislodged gastrostomy tube   Constipation   Pressure injury of skin   Epigastric/Abdominal pain, nausea and vomiting in the setting of esophageal dysphagia Patient is ANA/SSA antibody positive. Sjogren's syndrome and SLE are on the differential. Patient also has had increased oral secretion, suggestive of sicca-related symptoms. Gastrointestinal involvement of SLE can manifest as esophageal motility disorder, such as achalasia, which has been suggesting in this patient. Other GI manifestations of SLE include acute pancreatitis, for which  patient signs and symptoms are concerning, but CT 6/30 has shown no acute pancreatic findings. EGD in April 2022 showed unremarkable esophagus, and patient underwent dialtion given  history of dysphagia, but patient states that did not help with symptoms.   -Continue IV morphine 0.5 mg q.4h p.r.n. for severe pain -Continue Protonix 40 mg po BID -Continue clear liquid diet with plan to advanced diet as  tolerated -Gastroenterology following for further recommendations -ongoing goals of care discussions with palliative team, patient and daughter regarding tube feeding -Patient has undergone G-tube placement on 7/28 with dislodgment noted on 7/29 which has undergone replacement -She unfortunately developed aspiration pneumonia overnight and is on Unasyn now for coverage.  Tube feeds have been discontinued.  PICC line placed 8/1 and TPN initiated.  Plan for GJ tube conversion on 8/5. -She had fecal impaction on 8/1 that was mechanically disimpacted. -Denies abdominal pain has agree to proceed GJ tube conversion placement by IR on 01/30/2021.   Aspiration pneumonia -Cover with Unasyn and monitor, currently day 3/7 -No requiring oxygen supplementation no using accessory muscles.   Generalized weakness and dehydration in the setting of above -Hypoalbuminemia secondary to mild protein calorie malnutrition -Continue IV hydration as described above -Continue protein supplement -surgery consult for PEG placement likely 7/29   Urinary tract infection - TREATED - UA in the ED showed many bacteria, moderate ketones, nitrites -Patient has completed IV antibiotic therapy on 01/17/2021.  Normocytic anemia -Chronic and Stable   COVID-19 infection (incidental finding) -Patient is asymptomatic and stable on room air -This was already diagnosed during last admission   Essential hypertension -Continue Lopressor but since can't take tabs now, we changed it to IV lopressor 2.5 mg Q6h until she can take the tablets again  Hyperglycemia secondary to T2DM -Hemoglobin A1c on 6/30-5.8%   GERD -Continue Protonix  DNR -Continue to treat what is treatable -No intubation, no mechanical ventilatory support and no resuscitation. -Wishes and goals of care discussion will be respected.     DVT prophylaxis:SCD Code Status: DNR Family Communication: Daughter on phone 8/1, sister-in-law [name] on  phone Disposition Plan:  Status is: Inpatient   Remains inpatient appropriate because:Ongoing diagnostic testing needed not appropriate for outpatient work up and IV treatments appropriate due to intensity of illness or inability to take PO   Dispo: The patient is from: Home              Anticipated d/c is to: SNF              Patient currently is not medically stable to d/c.              Difficult to place patient No   Consultants:  Gastroenterology Palliative care Surgery (regarding PEG placement) IR   Procedures:  Barium swallow 7/20 EGD with botox injection 7/21 Open gastrostomy tube placement with lysis of adhesions and small bowel resection 7/28 Repeat placement of open gastrostomy tube on 7/29 PICC line for TPN 8/1  Antimicrobials:  Anti-infectives (From admission, onward)    Start     Dose/Rate Route Frequency Ordered Stop   01/26/21 0100  Ampicillin-Sulbactam (UNASYN) 3 g in sodium chloride 0.9 % 100 mL IVPB        3 g 200 mL/hr over 30 Minutes Intravenous Every 6 hours 01/26/21 0009     01/26/21 0100  doxycycline (VIBRAMYCIN) 100 mg in sodium chloride 0.9 % 250 mL IVPB  Status:  Discontinued        100 mg 125 mL/hr over 120 Minutes Intravenous Every 12 hours 01/26/21 0009 01/26/21 0745   01/23/21 1300  cefoTEtan (CEFOTAN) 2 g in sodium chloride 0.9 % 100 mL  IVPB        2 g 200 mL/hr over 30 Minutes Intravenous On call to O.R. 01/23/21 0959 01/23/21 1952   01/23/21 1157  sodium chloride 0.9 % with cefoTEtan (CEFOTAN) ADS Med       Note to Pharmacy: Devoria Glassing   : cabinet override      01/23/21 1157 01/23/21 1411   01/22/21 0600  ceFAZolin (ANCEF) IVPB 2g/100 mL premix        2 g 200 mL/hr over 30 Minutes Intravenous On call to O.R. 01/21/21 1747 01/22/21 0900   01/14/21 0830  cefTRIAXone (ROCEPHIN) 1 g in sodium chloride 0.9 % 100 mL IVPB        1 g 200 mL/hr over 30 Minutes Intravenous Every 24 hours 01/14/21 0735 01/16/21 1023   01/14/21 0800  fosfomycin  (MONUROL) packet 3 g  Status:  Discontinued        3 g Oral  Once 01/14/21 0711 01/14/21 0734       Subjective: No nausea, no vomiting, no fever, normal chest pain.  Patient is afebrile.  Good saturation on room air.  Receiving TPN feeds through PICC line and waiting for GJ tube conversion by IR on  01/30/21  Objective: Vitals:   01/29/21 0411 01/29/21 0541 01/29/21 0810 01/29/21 1321  BP: (!) 142/84  135/69 140/72  Pulse: (!) 101  96 94  Resp: 14  18 18   Temp: 97.6 F (36.4 C)  (!) 97.5 F (36.4 C) 97.6 F (36.4 C)  TempSrc: Oral  Oral Axillary  SpO2: 98%  100% 100%  Weight:  53.7 kg    Height:        Intake/Output Summary (Last 24 hours) at 01/29/2021 1615 Last data filed at 01/29/2021 1330 Gross per 24 hour  Intake 660.64 ml  Output 875 ml  Net -214.36 ml   Filed Weights   01/27/21 0400 01/28/21 0356 01/29/21 0541  Weight: 53.2 kg 52.4 kg 53.7 kg    Examination: General exam: Alert, awake, oriented x 3; patient reports 2 bowel movement in the last 24 hours; no chest pain, no nausea, no vomiting, no abdominal pain.  Patient is afebrile Respiratory system: Clear to auscultation. Respiratory effort normal.  No using accessory muscle.  Good saturation on room air. Cardiovascular system:RRR. No rubs or gallops; no JVD. Gastrointestinal system: Abdomen is nondistended, decreased breath sounds appreciated on exam; binder in place. Central nervous system: Alert and oriented. No focal neurological deficits. Extremities: No cyanosis or clubbing. Skin: No petechiae. Psychiatry: Judgement and insight appear normal. Mood & affect appropriate.   Data Reviewed: I have personally reviewed following labs and imaging studies  CBC: Recent Labs  Lab 01/23/21 0834 01/24/21 0335 01/25/21 0434 01/26/21 0432 01/27/21 0341  WBC 11.5* 7.8 9.3 8.1 5.6  NEUTROABS  --   --   --   --  5.2  HGB 9.8* 11.8* 9.6* 9.5* 8.8*  HCT 30.1* 37.7 30.5* 29.6* 28.1*  MCV 98.0 101.1* 100.7* 100.3* 99.6   PLT 172 161 154 164 155   Basic Metabolic Panel: Recent Labs  Lab 01/24/21 0335 01/25/21 0434 01/26/21 0432 01/27/21 0341 01/29/21 0443  NA 139 144 143 144 133*  K 3.9 3.5 3.4* 3.6 4.3  CL 107 110 109 109 102  CO2 26 28 29  32 24  GLUCOSE 108* 120* 120* 186* 230*  BUN 20 16 12 9 13   CREATININE 0.64 0.47 0.44 0.33* 0.34*  CALCIUM 8.2* 8.3* 8.2* 8.4* 8.5*  MG 2.1  1.8 1.8 1.7 2.1  PHOS  --   --   --  1.3* 3.1   GFR: Estimated Creatinine Clearance: 46.8 mL/min (A) (by C-G formula based on SCr of 0.34 mg/dL (L)).  Liver Function Tests: Recent Labs  Lab 01/27/21 0341 01/29/21 0443  AST 17 35  ALT 6 19  ALKPHOS 56 92  BILITOT 0.5 0.5  PROT 5.7* 6.0*  ALBUMIN 1.9* 1.9*   CBG: Recent Labs  Lab 01/28/21 2042 01/29/21 0021 01/29/21 0352 01/29/21 0732 01/29/21 1106  GLUCAP 166* 208* 252* 185* 214*   Lipid Profile: Recent Labs    01/27/21 0341  TRIG 108    Sepsis Labs: Recent Labs  Lab 01/26/21 0432  PROCALCITON 0.93    Recent Results (from the past 240 hour(s))  Surgical pcr screen     Status: Abnormal   Collection Time: 01/22/21  8:16 AM   Specimen: Nasal Mucosa; Nasal Swab  Result Value Ref Range Status   MRSA, PCR POSITIVE (A) NEGATIVE Final    Comment: RESULT CALLED TO, READ BACK BY AND VERIFIED WITH: EMILY WRIGHT,RN @1152  01/22/2021 KAY    Staphylococcus aureus POSITIVE (A) NEGATIVE Final    Comment: RESULT CALLED TO, READ BACK BY AND VERIFIED WITH: EMILY WRIGHT,RN @1152  01/22/2021 KAY (NOTE) The Xpert SA Assay (FDA approved for NASAL specimens in patients 35 years of age and older), is one component of a comprehensive surveillance program. It is not intended to diagnose infection nor to guide or monitor treatment. Performed at [facility], [address] [place], [address] [place]      Radiology Studies: No results found.   Scheduled Meds:  chlorhexidine  15 mL Mouth Rinse BID   Chlorhexidine Gluconate Cloth  6 each Topical Daily    free water  30 mL Per Tube Q4H   insulin aspart  0-9 Units Subcutaneous Q4H   mouth rinse  15 mL Mouth Rinse q12n4p   metoprolol tartrate  2.5 mg Intravenous Q6H   pantoprazole sodium  40 mg Per Tube BID   scopolamine  1 patch Transdermal Q72H   sodium chloride flush  10-40 mL Intracatheter Q12H   Continuous Infusions:  ampicillin-sulbactam (UNASYN) IV 3 g (01/29/21 1246)   TPN ADULT (ION) 80 mL/hr at 01/28/21 1739   TPN ADULT (ION)       LOS: 16 days    Time spent: 30 minutes   03/31/21, MD [facility]  If 7PM-7AM, please contact night-coverage [email or web address] 01/29/2021, 4:15 PM

## 2021-01-29 NOTE — Progress Notes (Signed)
Subjective:  Patient with no complaints except her lips are dry. Some mild discomfort ruq. Denies pain at site of tube. Last BM reported yesterday. No N/V.   Objective: Vital signs in last 24 hours: Temp:  [97.4 F (36.3 C)-98.7 F (37.1 C)] 97.6 F (36.4 C) (08/04 0411) Pulse Rate:  [91-118] 101 (08/04 0411) Resp:  [14-30] 14 (08/04 0411) BP: (139-184)/(69-94) 142/84 (08/04 0411) SpO2:  [95 %-100 %] 98 % (08/04 0411) Weight:  [53.7 kg] 53.7 kg (08/04 0541) Last BM Date: 01/28/21 General:   Alert,  Well-developed, well-nourished, pleasant and cooperative in NAD Head:  Normocephalic and atraumatic. Eyes:  Sclera clear, no icterus.   Abdomen:  Soft, nontender and nondistended. Binder in place. Normal bowel sounds.   Extremities:  Without clubbing, deformity or edema. Neurologic:  Alert and  oriented x4;  grossly normal neurologically. Skin:  Intact without significant lesions or rashes. Psych:  Alert and cooperative. Normal mood and affect.  Intake/Output from previous day: 08/03 0701 - 08/04 0700 In: 1612.3 [I.V.:850.9; NG/GT:360; IV Piggyback:216.4] Out: 2075 [Urine:1950; Drains:125] Intake/Output this shift: No intake/output data recorded.  Lab Results: CBC Recent Labs    01/27/21 0341  WBC 5.6  HGB 8.8*  HCT 28.1*  MCV 99.6  PLT 155   BMET Recent Labs    01/27/21 0341 01/29/21 0443  NA 144 133*  K 3.6 4.3  CL 109 102  CO2 32 24  GLUCOSE 186* 230*  BUN 9 13  CREATININE 0.33* 0.34*  CALCIUM 8.4* 8.5*   LFTs Recent Labs    01/27/21 0341 01/29/21 0443  BILITOT 0.5 0.5  ALKPHOS 56 92  AST 17 35  ALT 6 19  PROT 5.7* 6.0*  ALBUMIN 1.9* 1.9*   No results for input(s): LIPASE in the last 72 hours. PT/INR No results for input(s): LABPROT, INR in the last 72 hours.    Imaging Studies: DG Chest 1 View  Result Date: 01/25/2021 CLINICAL DATA:  Increased shortness of breath. EXAM: CHEST  1 VIEW COMPARISON:  Radiograph 01/12/2019 FINDINGS: Lower lung  volumes from prior exam. Normal heart size with stable mediastinal contours. Development of patchy airspace disease in the right perihilar and right lung base. Minor left lung base atelectasis. Chronic elevation of left hemidiaphragm, accentuated by lower lung volumes. There may be a small right pleural effusion. No pneumothorax. There is mild residual barium in the upper abdomen from prior esophagram. IMPRESSION: Development of patchy airspace disease in the right perihilar and right lung base suspicious for pneumonia, aspiration is considered. Possible small right pleural effusion. Electronically Signed   By: Narda Rutherford M.D.   On: 01/25/2021 22:02   DG Abd 1 View  Result Date: 01/20/2021 CLINICAL DATA:  Abdominal pain. EXAM: ABDOMEN - 1 VIEW COMPARISON:  CT of the abdomen on 12/04/2020, esophagram on 01/14/2021 FINDINGS: There is some residual barium in the stomach from recent esophagram. There is some gaseous distension of the colon in the region of the transverse colon and there may be a component of colonic ileus. No small bowel dilatation or signs of free air. No abnormal calcifications. Clips related to prior cholecystectomy. Degenerative disc disease of the lumbar spine. IMPRESSION: Potential colonic ileus. Residual barium in the stomach related to recent esophagram 6 days ago. This may implicate some degree poor gastric motility/emptying. Electronically Signed   By: Irish Lack M.D.   On: 01/20/2021 12:26   CT Head Wo Contrast  Result Date: 01/13/2021 CLINICAL DATA:  Delirium. EXAM: CT HEAD  WITHOUT CONTRAST TECHNIQUE: Contiguous axial images were obtained from the base of the skull through the vertex without intravenous contrast. COMPARISON:  None. FINDINGS: Brain: No evidence of acute infarction, hemorrhage, hydrocephalus, extra-axial collection or mass lesion/mass effect. Vascular: No hyperdense vessel or unexpected calcification. Skull: Normal. Negative for fracture or focal lesion.  Sinuses/Orbits: No acute finding. Other: None. IMPRESSION: No acute intracranial abnormality seen. Electronically Signed   By: Lupita Raider M.D.   On: 01/13/2021 20:34   DG CHEST PORT 1 VIEW  Result Date: 01/26/2021 CLINICAL DATA:  PICC line placement EXAM: PORTABLE CHEST 1 VIEW COMPARISON:  01/25/2021 FINDINGS: Right upper extremity PICC line tip is seen within the superior cavoatrial junction. Right basilar pulmonary infiltrate again noted, compatible with infection or aspiration the appropriate clinical setting, has improved. No pneumothorax or pleural effusion. Cardiac size within normal limits. Pulmonary vascularity is normal. IMPRESSION: Right upper extremity PICC line tip within the superior cavoatrial junction. Improving right basilar consolidation. Electronically Signed   By: Helyn Numbers MD   On: 01/26/2021 19:02   [name] EKG SITE RITE  Result Date: 01/26/2021 If Site Rite image not attached, placement could not be confirmed due to current cardiac rhythm.  DG ESOPHAGUS W DOUBLE CM (HD)  Result Date: 01/14/2021 CLINICAL DATA:  Achalasia by prior esophageal manometry, dysphagia, regurgitation EXAM: ESOPHOGRAM / BARIUM SWALLOW / BARIUM TABLET STUDY TECHNIQUE: Combined double contrast and single contrast examination performed using effervescent crystals, thick barium liquid, and thin barium liquid. 12.5 mm diameter barium tablet was not administered. FLUOROSCOPY TIME:  Fluoroscopy Time:  3 minutes 18 seconds Radiation Exposure Index (if provided by the fluoroscopic device): 55.3 mGy Number of Acquired Spot Images: multiple fluoroscopic screen captures COMPARISON:  None FINDINGS: Patient unable to safely stand for upright imaging. Study performed horizontal up to 40 degrees of head elevation. Thoracic esophagus appears dilated with relative narrowing of the gastroesophageal junction. Extremely poor esophageal motility with very weak to absent primary peristaltic waves, occasional secondary waves, and  multiple non propulsive tertiary waves. Prolonged retention of contrast within thoracic esophagus despite elevation of table 40 degrees. Questionable linear filling defect at distal esophagus. Suspected ulcer along LEFT anterolateral aspect of the distal esophagus (RF#3 image 182/199 and RF#4 image 157/157)). No definite hiatal hernia. Focal area of narrowing at the cervical esophagus as well, though this could be relative related to distention of remainder of esophagus. No definite aspiration of contrast. At AP window, a potential small diverticulum or less likely additional ulcer is seen, RF#5 image109/127, RF#1 image 3/85. IMPRESSION: Dilated thoracic esophagus with extremely poor motility and relative narrowing of the GE junction, suggestive of achalasia. Probable ulcer at distal esophagus with additional diverticulum versus less likely ulcer at the level of the AP window. Questionable linear filling defect at distal esophagus such as a web or related to an ulcer. Electronically Signed   By: Ulyses Southward M.D.   On: 01/14/2021 12:47  [2 weeks]   Assessment: 80 year old female with hypertension, type 2 diabetes, GERD, positive ANA and SSA, chronic achalasia previously managed supportively, presented July 19 with worsening regurgitation, poor oral intake, dysphagia, weight loss.  EGD with Dr. Karilyn Cota 7/21st revealed hypertonic LES, large amount of fluid in the stomach (suctioned).  Botox injection performed.  Patient experienced very little improvement after.  Patient ultimately underwent open G-tube placement with plans for transition to GJ tube after 1 week.  G-tube placed July 28 with partial small bowel resection due to adhesions.  G-tube became dislodged  and replaced July 29.  Tube feedings started July 30.  Patient had episode of aspiration in the evening of August 1 with chest x-ray consistent with aspiration pneumonia, now on Unasyn.  Patient is now n.p.o., PICC line placed August 2 for TPN  administration.  Scheduled for G-tube to GJ tube August 5 with IR.  There has been discussion regarding trying motility agents to assist dysmotility once GJ tube is placed.  Consideration for later referral for surgical management of achalasia, at this time she would not be a good candidate due to her frailty.  Underlying motility disorder would still be an issue after correction of achalasia.  Constipation: Required rectal disimpaction and Fleet enema August 2. Two BMs in past 24 hours. Will continue to monitor.   Plan: Continue TPN.  Tube feeds on hold. G-tube to GJ tube planned August 5 with IR.   Leanna Battles. Dixon Boos [facility] [phone] 8/4/20228:07 AM    LOS: 16 days

## 2021-01-29 NOTE — Progress Notes (Signed)
PHARMACY - TOTAL PARENTERAL NUTRITION CONSULT NOTE   Indication: Intolerance to enteral feeding  Patient Measurements: Height: 5\' 4"  (162.6 cm) Weight: 53.7 kg (118 lb 6.2 oz) IBW/kg (Calculated) : 54.7 TPN AdjBW (KG): 49.7 Body mass index is 20.32 kg/m.  Assessment: Patient aspirated overnight and tube feeds were discontinued- consulted to transition to TPN.  Plan is replacement w/ jejunostomy tube on 01/30/2021.  TPN advanced.Electrolytes replaced and WNL.   Glucose / Insulin: 166-252 ;  8 units insulin Electrolytes: WNL   K 3.6> 4.3, Mg 1.7> 2.1, Phos 3.1  Renal: stable Hepatic: WNL Intake / Output; 1920/2075  Central access: PICC line being placed 8/1 TPN start date: 8/1  Nutritional Goals (per RD recommendation on 7/29): kCal: 1600-1795, Protein: 75-80 g, Fluid: 1.6-1.7 liters daily TPN provides 1670 kcal/day. Prot 76.8G; lipids 38.4g, and dextrose 288g  Current Nutrition:  Tube feeds- transitioned to TPN  Plan:  ContinueTPN to 80 mL/hr at 1800 Electrolytes in TPN: Na 35 mEq/L, K 46mEq/L, Ca 41mEq/L, Mg 7.15mEq/L, and Phos 27mmol/L. Cl:Ac 1:1 Add standard MVI and trace elements to TPN  Sensitive q4h SSI and adjust as needed  Reduce MIVF to off at 1800 Monitor TPN labs on Mon/Thurs,   9m, BS Elder Cyphers, Loura Back Clinical Pharmacist Pager [phone] 01/29/2021 8:19 AM

## 2021-01-30 DIAGNOSIS — K5900 Constipation, unspecified: Secondary | ICD-10-CM

## 2021-01-30 DIAGNOSIS — R1310 Dysphagia, unspecified: Secondary | ICD-10-CM | POA: Diagnosis not present

## 2021-01-30 DIAGNOSIS — K220 Achalasia of cardia: Secondary | ICD-10-CM

## 2021-01-30 LAB — GLUCOSE, CAPILLARY
Glucose-Capillary: 189 mg/dL — ABNORMAL HIGH (ref 70–99)
Glucose-Capillary: 209 mg/dL — ABNORMAL HIGH (ref 70–99)
Glucose-Capillary: 201 mg/dL — ABNORMAL HIGH (ref 70–99)
Glucose-Capillary: 209 mg/dL — ABNORMAL HIGH (ref 70–99)

## 2021-01-30 MED ORDER — GLYCOPYRROLATE 0.2 MG/ML IJ SOLN
0.1000 mg | Freq: Four times a day (QID) | INTRAMUSCULAR | Status: DC | PRN
  Administered 2021-01-30: 0.1 mg via INTRAVENOUS
  Filled 2021-01-30: qty 1

## 2021-01-30 MED ORDER — TRAVASOL 10 % IV SOLN
INTRAVENOUS | Status: AC
  Filled 2021-01-30: qty 768

## 2021-01-30 NOTE — Progress Notes (Signed)
IR called with rescheduled time for 0900 on Monday, 8/8. Patient and family made aware. [facility] Secretary called carelink to set up in advance.

## 2021-01-30 NOTE — Progress Notes (Signed)
Patient was scheduled for conversion of G to GJ tube with IR today.   Patient was seen by IR APP on 8/3, and plan was to transfer patient to [facility] IR by 9 am today but she was not transferred.   The procedure is rescheduled for Monday 8/8.  Carlynn Herald RN at [facility] was called and asked to transfer patient to [facility] IR by 9 am on Monday 8/8.  Patient will be transferred back to [facility] after the procedure.   Please call IR for questions and concerns.   Laurren Lepkowski H Francena Zender PA-C 01/30/2021 1:25 PM

## 2021-01-30 NOTE — Progress Notes (Signed)
Nutrition Follow-up  DOCUMENTATION CODES:   Non-severe (moderate) malnutrition in context of chronic illness  INTERVENTION:  TPN per pharmacy  When pt has is able to have PEG-J tube conversion and is clear to resume enteral feeding:   Initiate Osmolite 1.2 @ 20 ml/hr via PEG and increase by 10 ml every 8 hours to goal rate of 55 ml/hr.  Tube feeding regimen provides 1584 kcal, 73.2 grams of protein, and 1082 ml of H2O.      NUTRITION DIAGNOSIS:   Moderate Malnutrition related to chronic illness (achalasia) as evidenced by energy intake < or equal to 75% for > or equal to 1 month, mild fat depletion, mild muscle depletion. - receiving TPN and plans to resume enteral nutrition once PEG-J is placed  GOAL:  Patient will meet greater than or equal to 90% of their needs -progressing   MONITOR:  Diet advancement, Supplement acceptance, PO intake, Weight trends   ASSESSMENT: patient is a 80 yo female with history of DM-2, HTN, GERD and chronic achalasia. Patient presents with epigastric pain, weakness and dehydration.   Positive ANA and SSA antibody (to follow up with Rheumatology).  Patient is currently NPO. Poor tolerance of oral intake per chart review. No family at bedside during RD visit. Expect pt meeting < 75% of energy needs the past month based on chart review. RD assessed pt on 7/1 and she was eating very little at that time. Question if pt a candidate for tube feeding if chronically unable to meet nutrition needs orally.   Weight loss reported per chart review. Patient weight 10/24/20 -56.7 kg (124.7 lb) and currently 54.4 kg (119.6 lb).    7/29 SLP has signed off. Patient g-tube pulled out overnight. Will require operating room for replacement. Patient remains NPO. Plans were to start tube feeding today per MD note. Recommend consider providing D5% while pt is waiting for new PEG. Last BM 7/25 per nursing.  8/1 Talked with nursing and patient discussed during rounds. Patient  not tolerating tube feeds and will need TPN while waiting for PEG-J conversion scheduled 8/5. PICC being placed today. TPN @ 40 ml/hr start @ 1800 today. Last BM 7/25. At risk for refeeding.   8/2 Patient given enema and was disimpacted per nursing. Patient TPN advancing today to 80 ml/hr per pharmacy note. Standard MVI and trace elements added. Plan remains for PEG-J on Friday -IR at [facility]. Patient weight currently 53.2 kg (117 lb).    8/5 patient receiving TPN and was to have conversion of G- a GJ tube today IR. Procedure has been rescheduled for Monday 8/8. TPN @ 80 ml/hr meeting 100% estimated nutrition needs. [7510 kcal, 76.8 gr protein, 38.4 gr lipids]. Current weight 53.7 kg.  Medications reviewed and include:  protonix.  Intake/Output Summary (Last 24 hours) at 01/30/2021 1524 Last data filed at 01/29/2021 2330 Gross per 24 hour  Intake --  Output 320 ml  Net -320 ml    Labs: BMP Latest Ref Rng & Units 01/29/2021 01/27/2021 01/26/2021  Glucose 70 - 99 mg/dL 258(N) 277(O) 242(P)  BUN 8 - 23 mg/dL 13 9 12   Creatinine 0.44 - 1.00 mg/dL ) 5.36(R) 4.43(X  Sodium 135 - 145 mmol/L 133(L) 144 143  Potassium 3.5 - 5.1 mmol/L 4.3 3.6 3.4(L)  Chloride 98 - 111 mmol/L 102 109 109  CO2 22 - 32 mmol/L 24 32 29  Calcium 8.9 - 10.3 mg/dL 5.40) 0.8(Q) 7.6(P)      NUTRITION - FOCUSED PHYSICAL EXAM: Nutrition-Focused  physical exam completed. Findings are mild upper arm fat depletion, moderate clavicle and mild temporal muscle depletion, and no edema.     Diet Order:   Diet Order             Diet NPO time specified Except for: Sips with Meds  Diet effective midnight                   EDUCATION NEEDS:  Not appropriate for education at this time  Skin:  Skin Assessment: Reviewed RN Assessment  Last BM:  8/3   Height:   Ht Readings from Last 1 Encounters:  01/23/21 5\' 4"  (1.626 m)    Weight:   Wt Readings from Last 1 Encounters:  01/30/21 53.7 kg    Ideal Body Weight:   52  kg  BMI:  Body mass index is 20.32 kg/m.  Estimated Nutritional Needs:   Kcal:  1600-1795  Protein:  75-80 gr  Fluid:  1.6-1.7 liters daily  04/01/21 MS,RD,CSG,LDN Contact: Royann Shivers

## 2021-01-30 NOTE — Progress Notes (Signed)
Nursing secretary called IR at start of shift to get scheduled procedure time to call [facility], IR reported that they do not have scheduled time but will set up transport for this patient.   IR called and was inquiring if patient was on the way, stated that we were waiting on transport due to IR personnel stating they would set it up and they stated that they did not handle that.   Nurse stated that due to miscommunication that the patient would have to be rescheduled but no known time for that at this time.

## 2021-01-30 NOTE — Progress Notes (Signed)
PHARMACY - TOTAL PARENTERAL NUTRITION CONSULT NOTE   Indication: Intolerance to enteral feeding  Patient Measurements: Height: 5\' 4"  (162.6 cm) Weight: 53.7 kg (118 lb 6.2 oz) IBW/kg (Calculated) : 54.7 TPN AdjBW (KG): 49.7 Body mass index is 20.32 kg/m.  Assessment: Patient aspirated overnight and tube feeds were discontinued- consulted to transition to TPN.  Plan is replacement w/ jejunostomy tube on 01/30/2021.  TPN advanced.Electrolytes replaced and WNL.   Glucose / Insulin: 162-209 ;  13 units insulin Electrolytes: WNL   K 3.6> 4.3, Mg 1.7> 2.1, Phos 3.1  Renal: stable Hepatic: WNL Intake / Output; 1920/2075  Central access: PICC line being placed 8/1 TPN start date: 8/1  Nutritional Goals (per RD recommendation on 7/29): kCal: 1600-1795, Protein: 75-80 g, Fluid: 1.6-1.7 liters daily TPN provides 1670 kcal/day. Prot 76.8G; lipids 38.4g, and dextrose 288g  Current Nutrition:  Tube feeds- transitioned to TPN  Plan:  ContinueTPN to 80 mL/hr at 1800 Electrolytes in TPN: Na 35 mEq/L, K 46mEq/L, Ca 51mEq/L, Mg 7.64mEq/L, and Phos 21mmol/L. Cl:Ac 1:1 Add standard MVI and trace elements to TPN Add 10 units insulin  Sensitive q4h SSI and adjust as needed  Monitor TPN labs on Mon/Thurs, Likely discontinue TPN by 8/6  9m, PharmD Clinical Pharmacist 01/30/2021 10:48 AM

## 2021-01-30 NOTE — Care Management Important Message (Signed)
Important Message  Patient Details  Name: Kathryn [name] MRN: [id number] Date of Birth: August 03, 1940   Medicare Important Message Given:  Yes     Corey Harold 01/30/2021, 12:21 PM

## 2021-01-30 NOTE — Progress Notes (Signed)
PROGRESS NOTE    Kathryn [name]  NKN:[id number] DOB: May 06, 1941 DOA: 01/13/2021 PCP: Suzan Slick, MD   Brief Narrative:   Kathryn [name] is a 80 y.o. female with medical history significant for  hypertension, type 2 diabetes mellitus, GERD, positive ANA and SSA autoantibodies, and chronic achalasia (2014 manometry showed abnormal motility)  who presents to the emergency department due to more than 2 months history of decreased oral intake, generalized weakness and about 3 to 4-week onset of abdominal pain associated with difficulty in being able to swallow (solid and liquid).  She believes that she must have lost up to 30 pounds since onset of symptoms.  Patient endorsed that she usually regurgitates any food/liquid ingested within 15 to 20 minutes of ingestion.  Abdominal pain was in the epigastric area and was sharp in nature, this worsens with food.  She denies fever, chills, chest pain, shortness of breath.   She was admitted to the hospital from 12/25/2020-12/28/2020, at that time barium p.o. esophagram done urgently as an outpatient was indicated.  She was also admitted from 10/23/2020 to 10/25/2020 during which patient presented with proximal muscle weakness with an elevated CPK of 1206 that was concerning for polymyalgia rheumatica. This was treated with prednisone with improvement.  EGD done on 10/04/2020 showed normal dilated esophagus and small hiatal hernia.   In the emergency department, she was intermittently tachypneic and tachycardic.  Other vital signs were within normal range.  Work-up in the ED showed normocytic anemia and leukocytosis, BMP was normal except for elevated BUN at and hyperglycemia with CBG of 135.  Albumin 3.2. CT of head without contrast showed no acute intracranial abnormality. IV hydration was provided, hospitalist was asked to admit patient for further evaluation and management.  -Patient continued to experience significant epigastric pain and underwent EGD with  Botox treatment on 7/21.  She continued to have difficulty swallowing and plans were being made for PEG placement which patient was agreeable to.  She underwent open gastrostomy tube placement on 7/28 and then had dislodgment on 7/29 which required subsequent placement on that date.  She started on trickle feeds on 7/30 and feeds were being advanced, but unfortunately on 8/1 she had recurrent aspiration and is now on Unasyn for coverage.  Tube feeds had to be discontinued and plans are now for conversion to GJ tube later today 8/5 per IR.  In the meantime, she remains on TPN feeds via PICC line.  Assessment & Plan:   Principal Problem:   Abdominal pain Active Problems:   HTN (hypertension)   Dysphagia   Nausea and vomiting   Generalized weakness   Dehydration   Normocytic anemia   Leukocytosis   Hyperglycemia   Hypoalbuminemia due to protein-calorie malnutrition (HCC)   Prolonged QT interval   Achalasia   Malnutrition of moderate degree   Hypoactive bowel sounds   Dislodged gastrostomy tube   Constipation   Pressure injury of skin   Epigastric/Abdominal pain, nausea and vomiting in the setting of esophageal dysphagia Patient is ANA/SSA antibody positive. Sjogren's syndrome and SLE are on the differential. Patient also has had increased oral secretion, suggestive of sicca-related symptoms. Gastrointestinal involvement of SLE can manifest as esophageal motility disorder, such as achalasia, which has been suggesting in this patient. Other GI manifestations of SLE include acute pancreatitis, for which  patient signs and symptoms are concerning, but CT 6/30 has shown no acute pancreatic findings. EGD in April 2022 showed unremarkable esophagus, and patient underwent dialtion  given history of dysphagia, but patient states that did not help with symptoms.   -Continue IV morphine 0.5 mg q.4h p.r.n. for severe pain -Continue Protonix 40 mg po BID -Continue clear liquid diet with plan to advanced  diet as tolerated -Gastroenterology following for further recommendations -ongoing goals of care discussions with palliative team, patient and daughter regarding tube feeding -Patient has undergone G-tube placement on 7/28 with dislodgment noted on 7/29 which has undergone replacement -She unfortunately developed aspiration pneumonia overnight and is on Unasyn now for coverage.  Tube feeds have been discontinued.  PICC line placed 8/1 and TPN initiated.  Plan for GJ tube conversion on 8/5. -She had fecal impaction on 8/1 that was mechanically disimpacted. -Denies abdominal pain has agree to proceed GJ tube conversion placement by IR on 01/30/2021.   Aspiration pneumonia -Cover with Unasyn and monitor, currently day 5/7 -No requiring oxygen supplementation no using accessory muscles.   Generalized weakness and dehydration in the setting of above -Hypoalbuminemia secondary to mild protein calorie malnutrition -Continue IV hydration as described above -Continue protein supplement -surgery consult for PEG placement likely 7/29   Urinary tract infection - TREATED - UA in the ED showed many bacteria, moderate ketones, nitrites -Patient has completed IV antibiotic therapy on 01/17/2021.  Normocytic anemia -Chronic and Stable   COVID-19 infection (incidental finding) -Patient is asymptomatic and stable on room air -This was already diagnosed during last admission   Essential hypertension -Continue Lopressor but since can't take tabs now, we changed it to IV lopressor 2.5 mg Q6h until she can take the tablets again  Hyperglycemia secondary to T2DM -Hemoglobin A1c on 6/30-5.8%   GERD -Continue Protonix  DNR -Continue to treat what is treatable -No intubation, no mechanical ventilatory support and no resuscitation. -Wishes and goals of care discussion will be respected.     DVT prophylaxis:SCD Code Status: DNR Family Communication: Sister at bedside 8/5 Disposition Plan:  Status is:  Inpatient   Remains inpatient appropriate because:Ongoing diagnostic testing needed not appropriate for outpatient work up and IV treatments appropriate due to intensity of illness or inability to take PO   Dispo: The patient is from: Home              Anticipated d/c is to: SNF              Patient currently is not medically stable to d/c.              Difficult to place patient No   Consultants:  Gastroenterology Palliative care Surgery (regarding PEG placement) IR   Procedures:  Barium swallow 7/20 EGD with botox injection 7/21 Open gastrostomy tube placement with lysis of adhesions and small bowel resection 7/28 Repeat placement of open gastrostomy tube on 7/29 PICC line for TPN 8/1  Antimicrobials:  Anti-infectives (From admission, onward)    Start     Dose/Rate Route Frequency Ordered Stop   01/26/21 0100  Ampicillin-Sulbactam (UNASYN) 3 g in sodium chloride 0.9 % 100 mL IVPB        3 g 200 mL/hr over 30 Minutes Intravenous Every 6 hours 01/26/21 0009     01/26/21 0100  doxycycline (VIBRAMYCIN) 100 mg in sodium chloride 0.9 % 250 mL IVPB  Status:  Discontinued        100 mg 125 mL/hr over 120 Minutes Intravenous Every 12 hours 01/26/21 0009 01/26/21 0745   01/23/21 1300  cefoTEtan (CEFOTAN) 2 g in sodium chloride 0.9 % 100 mL IVPB  2 g 200 mL/hr over 30 Minutes Intravenous On call to O.R. 01/23/21 0959 01/23/21 1952   01/23/21 1157  sodium chloride 0.9 % with cefoTEtan (CEFOTAN) ADS Med       Note to Pharmacy: Devoria GlassingMoore, Kimberly   : cabinet override      01/23/21 1157 01/23/21 1411   01/22/21 0600  ceFAZolin (ANCEF) IVPB 2g/100 mL premix        2 g 200 mL/hr over 30 Minutes Intravenous On call to O.R. 01/21/21 1747 01/22/21 0900   01/14/21 0830  cefTRIAXone (ROCEPHIN) 1 g in sodium chloride 0.9 % 100 mL IVPB        1 g 200 mL/hr over 30 Minutes Intravenous Every 24 hours 01/14/21 0735 01/16/21 1023   01/14/21 0800  fosfomycin (MONUROL) packet 3 g  Status:   Discontinued        3 g Oral  Once 01/14/21 0711 01/14/21 0734       Subjective: No nausea, no vomiting, no fever, no chest pain.  Good oxygen saturation and no shortness of breath reported.  Ready to proceed with GJ tube placement.  Objective: Vitals:   01/29/21 2114 01/29/21 2323 01/30/21 0357 01/30/21 0500  BP: 136/68 129/68 136/68   Pulse: 99 99 97   Resp: 14  18   Temp: 98.9 F (37.2 C)  98.3 F (36.8 C)   TempSrc: Oral  Oral   SpO2: 100%  98%   Weight:    53.7 kg  Height:        Intake/Output Summary (Last 24 hours) at 01/30/2021 1147 Last data filed at 01/29/2021 2330 Gross per 24 hour  Intake --  Output 470 ml  Net -470 ml   Filed Weights   01/28/21 0356 01/29/21 0541 01/30/21 0500  Weight: 52.4 kg 53.7 kg 53.7 kg    Examination: General exam: Alert, awake, oriented x 3; denies chest pain, no nausea, no vomiting.  Patient is afebrile.  In no acute distress.  Good oxygen saturation on room air. Respiratory system: Clear to auscultation. Respiratory effort normal.  No using accessory muscle. Cardiovascular system:RRR. No rubs or gallops; no JVD. Gastrointestinal system: Abdomen is nondistended, decreased bowel sounds appreciated; no tenderness, no guarding.   Central nervous system: Alert and oriented. No focal neurological deficits. Extremities: No cyanosis or clubbing. Skin: No petechiae. Psychiatry: Judgement and insight appear normal. Mood & affect appropriate.     Data Reviewed: I have personally reviewed following labs and imaging studies  CBC: Recent Labs  Lab 01/24/21 0335 01/25/21 0434 01/26/21 0432 01/27/21 0341  WBC 7.8 9.3 8.1 5.6  NEUTROABS  --   --   --  5.2  HGB 11.8* 9.6* 9.5* 8.8*  HCT 37.7 30.5* 29.6* 28.1*  MCV 101.1* 100.7* 100.3* 99.6  PLT 161 154 164 155   Basic Metabolic Panel: Recent Labs  Lab 01/24/21 0335 01/25/21 0434 01/26/21 0432 01/27/21 0341 01/29/21 0443  NA 139 144 143 144 133*  K 3.9 3.5 3.4* 3.6 4.3  CL 107  110 109 109 102  CO2 26 28 29  32 24  GLUCOSE 108* 120* 120* 186* 230*  BUN 20 16 12 9 13   CREATININE 0.64 0.47 0.44 0.33* 0.34*  CALCIUM 8.2* 8.3* 8.2* 8.4* 8.5*  MG 2.1 1.8 1.8 1.7 2.1  PHOS  --   --   --  1.3* 3.1   GFR: Estimated Creatinine Clearance: 46.8 mL/min (A) (by C-G formula based on SCr of 0.34 mg/dL (L)).  Liver Function  Tests: Recent Labs  Lab 01/27/21 0341 01/29/21 0443  AST 17 35  ALT 6 19  ALKPHOS 56 92  BILITOT 0.5 0.5  PROT 5.7* 6.0*  ALBUMIN 1.9* 1.9*   CBG: Recent Labs  Lab 01/29/21 1615 01/29/21 2156 01/30/21 0349 01/30/21 0758 01/30/21 1117  GLUCAP 162* 211* 189* 209* 201*   Lipid Profile: No results for input(s): CHOL, HDL, LDLCALC, TRIG, CHOLHDL, LDLDIRECT in the last 72 hours.   Sepsis Labs: Recent Labs  Lab 01/26/21 0432  PROCALCITON 0.93    Recent Results (from the past 240 hour(s))  Surgical pcr screen     Status: Abnormal   Collection Time: 01/22/21  8:16 AM   Specimen: Nasal Mucosa; Nasal Swab  Result Value Ref Range Status   MRSA, PCR POSITIVE (A) NEGATIVE Final    Comment: RESULT CALLED TO, READ BACK BY AND VERIFIED WITH: EMILY WRIGHT,RN @1152  01/22/2021 KAY    Staphylococcus aureus POSITIVE (A) NEGATIVE Final    Comment: RESULT CALLED TO, READ BACK BY AND VERIFIED WITH: EMILY WRIGHT,RN @1152  01/22/2021 KAY (NOTE) The Xpert SA Assay (FDA approved for NASAL specimens in patients 59 years of age and older), is one component of a comprehensive surveillance program. It is not intended to diagnose infection nor to guide or monitor treatment. Performed at [facility], [address] [place], [address] [place]      Radiology Studies: No results found.   Scheduled Meds:  chlorhexidine  15 mL Mouth Rinse BID   Chlorhexidine Gluconate Cloth  6 each Topical Daily   free water  30 mL Per Tube Q4H   insulin aspart  0-9 Units Subcutaneous Q4H   mouth rinse  15 mL Mouth Rinse q12n4p   metoprolol tartrate  2.5 mg  Intravenous Q6H   pantoprazole sodium  40 mg Per Tube BID   scopolamine  1 patch Transdermal Q72H   sodium chloride flush  10-40 mL Intracatheter Q12H   Continuous Infusions:  ampicillin-sulbactam (UNASYN) IV 3 g (01/30/21 0549)   TPN ADULT (ION) 80 mL/hr at 01/29/21 1945   TPN ADULT (ION)       LOS: 17 days    Time spent: 25 minutes   04/01/21, MD [facility]  If 7PM-7AM, please contact night-coverage [email or web address] 01/30/2021, 11:47 AM

## 2021-01-30 NOTE — Progress Notes (Signed)
Patient has no complaints. Patient remains on parenteral nutrition. She was supposed to go to [facility] for conversion of PEG tube PEJ but this never materialized. I talked with Dr. Daune Perch of IR this afternoon and he indicates that the procedure would not be possible until Monday. Therefore patient will continue parenteral nutrition for now.  Patient's condition discussed with her sister and daughter who are at bedside. Her daughter Ms. Haynes Dage is worried that she may have scleroderma. She certainly could have scleroderma but she definitely has achalasia for which she had laparoscopic Heller's myotomy in December 2014 and endoscopic findings from 2 weeks ago consistent with this diagnosis. GI study was also consistent with this diagnosis. She will be referred back to [facility] where she had initial evaluation and treatment.

## 2021-01-31 DIAGNOSIS — R1310 Dysphagia, unspecified: Secondary | ICD-10-CM | POA: Diagnosis not present

## 2021-01-31 DIAGNOSIS — K220 Achalasia of cardia: Secondary | ICD-10-CM | POA: Diagnosis not present

## 2021-01-31 DIAGNOSIS — K5900 Constipation, unspecified: Secondary | ICD-10-CM | POA: Diagnosis not present

## 2021-01-31 LAB — COMPREHENSIVE METABOLIC PANEL
ALT: 29 U/L (ref 0–44)
AST: 47 U/L — ABNORMAL HIGH (ref 15–41)
Albumin: 1.9 g/dL — ABNORMAL LOW (ref 3.5–5.0)
Alkaline Phosphatase: 133 U/L — ABNORMAL HIGH (ref 38–126)
Anion gap: 4 — ABNORMAL LOW (ref 5–15)
BUN: 14 mg/dL (ref 8–23)
CO2: 25 mmol/L (ref 22–32)
Calcium: 8.5 mg/dL — ABNORMAL LOW (ref 8.9–10.3)
Chloride: 103 mmol/L (ref 98–111)
Creatinine, Ser: 0.36 mg/dL — ABNORMAL LOW (ref 0.44–1.00)
GFR, Estimated: >60 mL/min (ref >60)
Glucose, Bld: 202 mg/dL — ABNORMAL HIGH (ref 70–99)
Potassium: 4.6 mmol/L (ref 3.5–5.1)
Sodium: 132 mmol/L — ABNORMAL LOW (ref 135–145)
Total Bilirubin: 0.5 mg/dL (ref 0.3–1.2)
Total Protein: 5.8 g/dL — ABNORMAL LOW (ref 6.5–8.1)

## 2021-01-31 LAB — GLUCOSE, CAPILLARY
Glucose-Capillary: 186 mg/dL — ABNORMAL HIGH (ref 70–99)
Glucose-Capillary: 216 mg/dL — ABNORMAL HIGH (ref 70–99)
Glucose-Capillary: 94 mg/dL (ref 70–99)
Glucose-Capillary: 128 mg/dL — ABNORMAL HIGH (ref 70–99)
Glucose-Capillary: 186 mg/dL — ABNORMAL HIGH (ref 70–99)

## 2021-01-31 LAB — PHOSPHORUS: Phosphorus: 3.5 mg/dL (ref 2.5–4.6)

## 2021-01-31 LAB — MAGNESIUM: Magnesium: 2 mg/dL (ref 1.7–2.4)

## 2021-01-31 MED ORDER — FAT EMUL FISH OIL/PLANT BASED 20% (SMOFLIPID)IV EMUL
INTRAVENOUS | Status: AC
  Filled 2021-01-31: qty 768

## 2021-01-31 NOTE — Progress Notes (Signed)
PROGRESS NOTE    Kathryn [name]  MNO:[id number] DOB: 1941/01/09 DOA: 01/13/2021 PCP: Suzan Slick, MD   Brief Narrative:   Kathryn [name] is a 80 y.o. female with medical history significant for  hypertension, type 2 diabetes mellitus, GERD, positive ANA and SSA autoantibodies, and chronic achalasia (2014 manometry showed abnormal motility)  who presents to the emergency department due to more than 2 months history of decreased oral intake, generalized weakness and about 3 to 4-week onset of abdominal pain associated with difficulty in being able to swallow (solid and liquid).  She believes that she must have lost up to 30 pounds since onset of symptoms.  Patient endorsed that she usually regurgitates any food/liquid ingested within 15 to 20 minutes of ingestion.  Abdominal pain was in the epigastric area and was sharp in nature, this worsens with food.  She denies fever, chills, chest pain, shortness of breath.   She was admitted to the hospital from 12/25/2020-12/28/2020, at that time barium p.o. esophagram done urgently as an outpatient was indicated.  She was also admitted from 10/23/2020 to 10/25/2020 during which patient presented with proximal muscle weakness with an elevated CPK of 1206 that was concerning for polymyalgia rheumatica. This was treated with prednisone with improvement.  EGD done on 10/04/2020 showed normal dilated esophagus and small hiatal hernia.   In the emergency department, she was intermittently tachypneic and tachycardic.  Other vital signs were within normal range.  Work-up in the ED showed normocytic anemia and leukocytosis, BMP was normal except for elevated BUN at and hyperglycemia with CBG of 135.  Albumin 3.2. CT of head without contrast showed no acute intracranial abnormality. IV hydration was provided, hospitalist was asked to admit patient for further evaluation and management.  -Patient continued to experience significant epigastric pain and underwent EGD with  Botox treatment on 7/21.  She continued to have difficulty swallowing and plans were being made for PEG placement which patient was agreeable to.  She underwent open gastrostomy tube placement on 7/28 and then had dislodgment on 7/29 which required subsequent placement on that date.  She started on trickle feeds on 7/30 and feeds were being advanced, but unfortunately on 8/1 she had recurrent aspiration and is now on Unasyn for coverage.  Tube feeds had to be discontinued and plans are now for conversion to GJ tube on Monday 02/02/21 per IR.  In the meantime, she remains on TPN feeds via PICC line.  Assessment & Plan:   Principal Problem:   Abdominal pain Active Problems:   HTN (hypertension)   Dysphagia   Nausea and vomiting   Generalized weakness   Dehydration   Normocytic anemia   Leukocytosis   Hyperglycemia   Hypoalbuminemia due to protein-calorie malnutrition (HCC)   Prolonged QT interval   Achalasia   Malnutrition of moderate degree   Hypoactive bowel sounds   Dislodged gastrostomy tube   Constipation   Pressure injury of skin   Epigastric/Abdominal pain, nausea and vomiting in the setting of esophageal dysphagia Patient is ANA/SSA antibody positive. Sjogren's syndrome and SLE are on the differential. Patient also has had increased oral secretion, suggestive of sicca-related symptoms. Gastrointestinal involvement of SLE can manifest as esophageal motility disorder, such as achalasia, which has been suggesting in this patient. Other GI manifestations of SLE include acute pancreatitis, for which  patient signs and symptoms are concerning, but CT 6/30 has shown no acute pancreatic findings. EGD in April 2022 showed unremarkable esophagus, and patient underwent dialtion  given history of dysphagia, but patient states that did not help with symptoms.   -Continue IV morphine 0.5 mg q.4h p.r.n. for severe pain -Continue Protonix 40 mg po BID -Continue clear liquid diet with plan to  advanced diet as tolerated -Gastroenterology following for further recommendations -ongoing goals of care discussions with palliative team, patient and daughter regarding tube feeding -Patient has undergone G-tube placement on 7/28 with dislodgment noted on 7/29 which has undergone replacement -She unfortunately developed aspiration pneumonia overnight and is on Unasyn now for coverage.  Tube feeds have been discontinued.  PICC line placed 8/1 and TPN initiated.   -Given transportation issues patient was not transferred to: For GJ tube conversion by IR as previously scheduled; currently not availability until 02/02/2021 for GJ tube placement. -She had fecal impaction on 8/1 that was mechanically disimpacted. -Continue TPN.  Aspiration pneumonia -Cover with Unasyn and monitor, currently day 5/7 -No requiring oxygen supplementation no using accessory muscles.   Generalized weakness and dehydration in the setting of above -Hypoalbuminemia secondary to mild protein calorie malnutrition -Continue IV hydration as described above -Continue protein supplement -surgery consult for PEG placement likely 7/29   Urinary tract infection - TREATED - UA in the ED showed many bacteria, moderate ketones, nitrites -Patient has completed IV antibiotic therapy on 01/17/2021.  Normocytic anemia -Chronic and Stable   COVID-19 infection (incidental finding) -Patient is asymptomatic and stable on room air -This was already diagnosed during last admission   Essential hypertension -Continue Lopressor but since can't take tabs now, we changed it to IV lopressor 2.5 mg Q6h until she can take the tablets again  Hyperglycemia secondary to T2DM -Hemoglobin A1c on 6/30-5.8%   GERD -Continue Protonix  DNR -Continue to treat what is treatable -No intubation, no mechanical ventilatory support and no resuscitation. -Wishes and goals of care discussion will be respected.     DVT prophylaxis:SCD Code Status:  DNR Family Communication: Sister at bedside 8/5; no family at bedside during today's evaluation. Disposition Plan:  Status is: Inpatient   Remains inpatient appropriate because:Ongoing diagnostic testing needed not appropriate for outpatient work up and IV treatments appropriate due to intensity of illness or inability to take PO   Dispo: The patient is from: Home              Anticipated d/c is to: SNF              Patient currently is not medically stable to d/c.              Difficult to place patient No   Consultants:  Gastroenterology Palliative care Surgery (regarding PEG placement) IR   Procedures:  Barium swallow 7/20 EGD with botox injection 7/21 Open gastrostomy tube placement with lysis of adhesions and small bowel resection 7/28 Repeat placement of open gastrostomy tube on 7/29 PICC line for TPN 8/1  Antimicrobials:  Anti-infectives (From admission, onward)    Start     Dose/Rate Route Frequency Ordered Stop   01/26/21 0100  Ampicillin-Sulbactam (UNASYN) 3 g in sodium chloride 0.9 % 100 mL IVPB        3 g 200 mL/hr over 30 Minutes Intravenous Every 6 hours 01/26/21 0009     01/26/21 0100  doxycycline (VIBRAMYCIN) 100 mg in sodium chloride 0.9 % 250 mL IVPB  Status:  Discontinued        100 mg 125 mL/hr over 120 Minutes Intravenous Every 12 hours 01/26/21 0009 01/26/21 0745   01/23/21 1300  cefoTEtan (  CEFOTAN) 2 g in sodium chloride 0.9 % 100 mL IVPB        2 g 200 mL/hr over 30 Minutes Intravenous On call to O.R. 01/23/21 0959 01/23/21 1952   01/23/21 1157  sodium chloride 0.9 % with cefoTEtan (CEFOTAN) ADS Med       Note to Pharmacy: Devoria Glassing   : cabinet override      01/23/21 1157 01/23/21 1411   01/22/21 0600  ceFAZolin (ANCEF) IVPB 2g/100 mL premix        2 g 200 mL/hr over 30 Minutes Intravenous On call to O.R. 01/21/21 1747 01/22/21 0900   01/14/21 0830  cefTRIAXone (ROCEPHIN) 1 g in sodium chloride 0.9 % 100 mL IVPB        1 g 200 mL/hr over 30  Minutes Intravenous Every 24 hours 01/14/21 0735 01/16/21 1023   01/14/21 0800  fosfomycin (MONUROL) packet 3 g  Status:  Discontinued        3 g Oral  Once 01/14/21 0711 01/14/21 0734       Subjective:  No nausea, no vomiting, no fever, no chest pain, no shortness of breath.  Due to transportation issues patient was not able to be transfer to [facility] on 01/30/2021 for anticipated/schedule GJ tube conversion by IR.  Objective: Vitals:   01/30/21 2250 01/31/21 0500 01/31/21 0556 01/31/21 1403  BP: 139/61  130/62 116/90  Pulse: (!) 107  (!) 102 (!) 101  Resp: 20  20   Temp: 97.8 F (36.6 C)  98.7 F (37.1 C) 99 F (37.2 C)  TempSrc: Oral  Oral Oral  SpO2: 99%  100% 99%  Weight:  53.5 kg    Height:        Intake/Output Summary (Last 24 hours) at 01/31/2021 1743 Last data filed at 01/31/2021 1300 Gross per 24 hour  Intake 1671.58 ml  Output --  Net 1671.58 ml   Filed Weights   01/29/21 0541 01/30/21 0500 01/31/21 0500  Weight: 53.7 kg 53.7 kg 53.5 kg    Examination: General exam: Alert, awake, oriented x 3; afebrile, no chest pain, no nausea, no vomiting.  Patient reporting thick oral secretions.  In no acute distress. Respiratory system: Clear to auscultation. Respiratory effort normal.  No requiring oxygen supplementation.  No using accessory muscle. Cardiovascular system:RRR. No murmurs, rubs, gallops.  No JVD. Gastrointestinal system: Abdomen is nondistended, soft and with positive bowel sounds.  Patient reports mild discomfort to deep palpation. Central nervous system: Alert and oriented. No focal neurological deficits. Extremities: No cyanosis or clubbing. Skin: No petechiae. Psychiatry: Judgement and insight appear normal. Mood & affect appropriate.    Data Reviewed: I have personally reviewed following labs and imaging studies  CBC: Recent Labs  Lab 01/25/21 0434 01/26/21 0432 01/27/21 0341  WBC 9.3 8.1 5.6  NEUTROABS  --   --  5.2  HGB 9.6* 9.5*  8.8*  HCT 30.5* 29.6* 28.1*  MCV 100.7* 100.3* 99.6  PLT 154 164 155   Basic Metabolic Panel: Recent Labs  Lab 01/25/21 0434 01/26/21 0432 01/27/21 0341 01/29/21 0443 01/31/21 0545  NA 144 143 144 133* 132*  K 3.5 3.4* 3.6 4.3 4.6  CL 110 109 109 102 103  CO2 28 29 32 24 25  GLUCOSE 120* 120* 186* 230* 202*  BUN 16 12 9 13 14   CREATININE 0.47 0.44 0.33* 0.34* 0.36*  CALCIUM 8.3* 8.2* 8.4* 8.5* 8.5*  MG 1.8 1.8 1.7 2.1 2.0  PHOS  --   --  1.3* 3.1 3.5   GFR: Estimated Creatinine Clearance: 46.6 mL/min (A) (by C-G formula based on SCr of 0.36 mg/dL (L)).  Liver Function Tests: Recent Labs  Lab 01/27/21 0341 01/29/21 0443 01/31/21 0545  AST 17 35 47*  ALT 6 19 29   ALKPHOS 56 92 133*  BILITOT 0.5 0.5 0.5  PROT 5.7* 6.0* 5.8*  ALBUMIN 1.9* 1.9* 1.9*   CBG: Recent Labs  Lab 01/30/21 2154 01/31/21 0555 01/31/21 0734 01/31/21 1109 01/31/21 1558  GLUCAP 209* 186* 216* 94 128*   Lipid Profile: No results for input(s): CHOL, HDL, LDLCALC, TRIG, CHOLHDL, LDLDIRECT in the last 72 hours.   Sepsis Labs: Recent Labs  Lab 01/26/21 0432  PROCALCITON 0.93    Recent Results (from the past 240 hour(s))  Surgical pcr screen     Status: Abnormal   Collection Time: 01/22/21  8:16 AM   Specimen: Nasal Mucosa; Nasal Swab  Result Value Ref Range Status   MRSA, PCR POSITIVE (A) NEGATIVE Final    Comment: RESULT CALLED TO, READ BACK BY AND VERIFIED WITH: EMILY WRIGHT,RN @1152  01/22/2021 KAY    Staphylococcus aureus POSITIVE (A) NEGATIVE Final    Comment: RESULT CALLED TO, READ BACK BY AND VERIFIED WITH: EMILY WRIGHT,RN @1152  01/22/2021 KAY (NOTE) The Xpert SA Assay (FDA approved for NASAL specimens in patients 38 years of age and older), is one component of a comprehensive surveillance program. It is not intended to diagnose infection nor to guide or monitor treatment. Performed at [facility], [address], [place], [facility] [address]      Radiology Studies: No  results found.   Scheduled Meds:  chlorhexidine  15 mL Mouth Rinse BID   Chlorhexidine Gluconate Cloth  6 each Topical Daily   free water  30 mL Per Tube Q4H   insulin aspart  0-9 Units Subcutaneous Q4H   mouth rinse  15 mL Mouth Rinse q12n4p   metoprolol tartrate  2.5 mg Intravenous Q6H   pantoprazole sodium  40 mg Per Tube BID   scopolamine  1 patch Transdermal Q72H   sodium chloride flush  10-40 mL Intracatheter Q12H   Continuous Infusions:  ampicillin-sulbactam (UNASYN) IV 3 g (01/31/21 1730)   TPN ADULT (ION) 80 mL/hr at 01/30/21 1736   TPN ADULT (ION) 80 mL/hr at 01/31/21 1733     LOS: 18 days    Time spent: 25 minutes   04/02/21, MD [facility]  If 7PM-7AM, please contact night-coverage [email or web address] 01/31/2021, 5:43 PM

## 2021-01-31 NOTE — Progress Notes (Addendum)
PHARMACY - TOTAL PARENTERAL NUTRITION CONSULT NOTE   Indication: Intolerance to enteral feeding  Patient Measurements: Height: 5\' 4"  (162.6 cm) Weight: 53.5 kg (117 lb 15.1 oz) IBW/kg (Calculated) : 54.7 TPN AdjBW (KG): 49.7 Body mass index is 20.25 kg/m.  Assessment: Patient aspirated overnight and tube feeds were discontinued- consulted to transition to TPN.  Plan is replacement w/ jejunostomy tube on 02/02/2021.  TPN advanced.Electrolytes replaced and WNL.   Glucose / Insulin: 186-216 ;  11 units insulin Electrolytes: WNL     Na 132    Renal: stable Hepatic: WNL   Central access: PICC line being placed 8/1 TPN start date: 8/1  Nutritional Goals (per RD recommendation on 7/29): kCal: 1600-1795, Protein: 75-80 g, Fluid: 1.6-1.7 liters daily TPN provides 1670 kcal/day. Prot 76.8G; lipids 38.4g, and dextrose 288g  Current Nutrition:  Tube feeds- transitioned to TPN  Plan:  ContinueTPN to 80 mL/hr at 1800 Electrolytes in TPN: Na 76mEq/L, K 63mEq/L, Ca 32mEq/L, Mg 7.53mEq/L, and Phos 74mmol/L. Cl:Ac 1:1 Add standard MVI and trace elements to TPN Add 15 units insulin  Sensitive q4h SSI and adjust as needed  Monitor TPN labs on Mon/Thurs,   9m, PharmD Clinical Pharmacist 01/31/2021 9:52 AM

## 2021-02-01 DIAGNOSIS — K5900 Constipation, unspecified: Secondary | ICD-10-CM | POA: Diagnosis not present

## 2021-02-01 DIAGNOSIS — K220 Achalasia of cardia: Secondary | ICD-10-CM | POA: Diagnosis not present

## 2021-02-01 DIAGNOSIS — R1310 Dysphagia, unspecified: Secondary | ICD-10-CM | POA: Diagnosis not present

## 2021-02-01 LAB — GLUCOSE, CAPILLARY
Glucose-Capillary: 205 mg/dL — ABNORMAL HIGH (ref 70–99)
Glucose-Capillary: 135 mg/dL — ABNORMAL HIGH (ref 70–99)
Glucose-Capillary: 143 mg/dL — ABNORMAL HIGH (ref 70–99)
Glucose-Capillary: 143 mg/dL — ABNORMAL HIGH (ref 70–99)
Glucose-Capillary: 162 mg/dL — ABNORMAL HIGH (ref 70–99)

## 2021-02-01 MED ORDER — INSULIN ASPART 100 UNIT/ML IJ SOLN
0.0000 [IU] | Freq: Four times a day (QID) | INTRAMUSCULAR | Status: DC
  Administered 2021-02-01 (×2): 1 [IU] via SUBCUTANEOUS
  Administered 2021-02-02: 2 [IU] via SUBCUTANEOUS
  Administered 2021-02-02: 3 [IU] via SUBCUTANEOUS
  Administered 2021-02-03: 2 [IU] via SUBCUTANEOUS
  Administered 2021-02-03: 3 [IU] via SUBCUTANEOUS
  Administered 2021-02-03 (×2): 2 [IU] via SUBCUTANEOUS
  Administered 2021-02-04 – 2021-02-05 (×4): 1 [IU] via SUBCUTANEOUS
  Administered 2021-02-05: 3 [IU] via SUBCUTANEOUS
  Administered 2021-02-05 – 2021-02-06 (×3): 2 [IU] via SUBCUTANEOUS

## 2021-02-01 MED ORDER — TRAVASOL 10 % IV SOLN
INTRAVENOUS | Status: AC
  Filled 2021-02-01: qty 768

## 2021-02-01 NOTE — Progress Notes (Signed)
Patient is sleeping. Patient's daughter Kathryn [name] and brother Kathryn [name] at at bedside. According them she continues to complain of abdominal pain. Patient had open gastrostomy tube placement possibly the reason for abdominal pain. She remains with stable vital signs. Patient remains on TPN which is being managed by pharmacy. Patient is scheduled to be at [facility] radiology tomorrow at 9 AM tomorrow. Transportation has been arranged. Patient will be started on enteral feeding once she has jejunal feeding tube.

## 2021-02-01 NOTE — Progress Notes (Signed)
PROGRESS NOTE    Kathryn [name]  MNO:[id number] DOB: 1941/01/09 DOA: 01/13/2021 PCP: Suzan Slick, MD   Brief Narrative:   Kathryn [name] is a 80 y.o. female with medical history significant for  hypertension, type 2 diabetes mellitus, GERD, positive ANA and SSA autoantibodies, and chronic achalasia (2014 manometry showed abnormal motility)  who presents to the emergency department due to more than 2 months history of decreased oral intake, generalized weakness and about 3 to 4-week onset of abdominal pain associated with difficulty in being able to swallow (solid and liquid).  She believes that she must have lost up to 30 pounds since onset of symptoms.  Patient endorsed that she usually regurgitates any food/liquid ingested within 15 to 20 minutes of ingestion.  Abdominal pain was in the epigastric area and was sharp in nature, this worsens with food.  She denies fever, chills, chest pain, shortness of breath.   She was admitted to the hospital from 12/25/2020-12/28/2020, at that time barium p.o. esophagram done urgently as an outpatient was indicated.  She was also admitted from 10/23/2020 to 10/25/2020 during which patient presented with proximal muscle weakness with an elevated CPK of 1206 that was concerning for polymyalgia rheumatica. This was treated with prednisone with improvement.  EGD done on 10/04/2020 showed normal dilated esophagus and small hiatal hernia.   In the emergency department, she was intermittently tachypneic and tachycardic.  Other vital signs were within normal range.  Work-up in the ED showed normocytic anemia and leukocytosis, BMP was normal except for elevated BUN at and hyperglycemia with CBG of 135.  Albumin 3.2. CT of head without contrast showed no acute intracranial abnormality. IV hydration was provided, hospitalist was asked to admit patient for further evaluation and management.  -Patient continued to experience significant epigastric pain and underwent EGD with  Botox treatment on 7/21.  She continued to have difficulty swallowing and plans were being made for PEG placement which patient was agreeable to.  She underwent open gastrostomy tube placement on 7/28 and then had dislodgment on 7/29 which required subsequent placement on that date.  She started on trickle feeds on 7/30 and feeds were being advanced, but unfortunately on 8/1 she had recurrent aspiration and is now on Unasyn for coverage.  Tube feeds had to be discontinued and plans are now for conversion to GJ tube on Monday 02/02/21 per IR.  In the meantime, she remains on TPN feeds via PICC line.  Assessment & Plan:   Principal Problem:   Abdominal pain Active Problems:   HTN (hypertension)   Dysphagia   Nausea and vomiting   Generalized weakness   Dehydration   Normocytic anemia   Leukocytosis   Hyperglycemia   Hypoalbuminemia due to protein-calorie malnutrition (HCC)   Prolonged QT interval   Achalasia   Malnutrition of moderate degree   Hypoactive bowel sounds   Dislodged gastrostomy tube   Constipation   Pressure injury of skin   Epigastric/Abdominal pain, nausea and vomiting in the setting of esophageal dysphagia Patient is ANA/SSA antibody positive. Sjogren's syndrome and SLE are on the differential. Patient also has had increased oral secretion, suggestive of sicca-related symptoms. Gastrointestinal involvement of SLE can manifest as esophageal motility disorder, such as achalasia, which has been suggesting in this patient. Other GI manifestations of SLE include acute pancreatitis, for which  patient signs and symptoms are concerning, but CT 6/30 has shown no acute pancreatic findings. EGD in April 2022 showed unremarkable esophagus, and patient underwent dialtion  given history of dysphagia, but patient states that did not help with symptoms.   -Continue IV morphine 0.5 mg q.4h p.r.n. for severe pain -Continue Protonix 40 mg po BID -Continue clear liquid diet with plan to  advanced diet as tolerated -Gastroenterology following for further recommendations -ongoing goals of care discussions with palliative team, patient and daughter regarding tube feeding -Patient has undergone G-tube placement on 7/28 with dislodgment noted on 7/29 which has undergone replacement -She unfortunately developed aspiration pneumonia overnight and is on Unasyn now for coverage.  Tube feeds have been discontinued.  PICC line placed 8/1 and TPN initiated.   -Given transportation issues patient was not transferred to: For GJ tube conversion by IR as previously scheduled; currently not availability until 02/02/2021 for GJ tube placement. -Transportation has been arranged for tomorrow morning. -She had fecal impaction on 8/1 that was mechanically disimpacted. -Continue TPN.  Aspiration pneumonia -Cover with Unasyn and monitor, currently day 5/7 -No requiring oxygen supplementation no using accessory muscles.   Generalized weakness and dehydration in the setting of above -Hypoalbuminemia secondary to mild protein calorie malnutrition -Continue IV hydration as described above -Continue protein supplement -surgery consult for PEG placement likely 7/29   Urinary tract infection - TREATED - UA in the ED showed many bacteria, moderate ketones, nitrites -Patient has completed IV antibiotic therapy on 01/17/2021.  Normocytic anemia -Chronic and Stable   COVID-19 infection (incidental finding) -Patient is asymptomatic and stable on room air -This was already diagnosed during last admission   Essential hypertension -Continue Lopressor but since can't take tabs now, we changed it to IV lopressor 2.5 mg Q6h until she can take the tablets again  Hyperglycemia secondary to T2DM -Hemoglobin A1c on 6/30-5.8%   GERD -Continue Protonix  DNR -Continue to treat what is treatable -No intubation, no mechanical ventilatory support and no resuscitation. -Wishes and goals of care discussion will  be respected.     DVT prophylaxis:SCD Code Status: DNR Family Communication: Sister at bedside 8/5; no family at bedside during today's evaluation. Disposition Plan:  Status is: Inpatient   Remains inpatient appropriate because:Ongoing diagnostic testing needed not appropriate for outpatient work up and IV treatments appropriate due to intensity of illness or inability to take PO   Dispo: The patient is from: Home              Anticipated d/c is to: SNF              Patient currently is not medically stable to d/c.              Difficult to place patient No   Consultants:  Gastroenterology Palliative care Surgery (regarding PEG placement) IR   Procedures:  Barium swallow 7/20 EGD with botox injection 7/21 Open gastrostomy tube placement with lysis of adhesions and small bowel resection 7/28 Repeat placement of open gastrostomy tube on 7/29 PICC line for TPN 8/1  Antimicrobials:  Anti-infectives (From admission, onward)    Start     Dose/Rate Route Frequency Ordered Stop   01/26/21 0100  Ampicillin-Sulbactam (UNASYN) 3 g in sodium chloride 0.9 % 100 mL IVPB        3 g 200 mL/hr over 30 Minutes Intravenous Every 6 hours 01/26/21 0009     01/26/21 0100  doxycycline (VIBRAMYCIN) 100 mg in sodium chloride 0.9 % 250 mL IVPB  Status:  Discontinued        100 mg 125 mL/hr over 120 Minutes Intravenous Every 12 hours 01/26/21 0009 01/26/21  0745   01/23/21 1300  cefoTEtan (CEFOTAN) 2 g in sodium chloride 0.9 % 100 mL IVPB        2 g 200 mL/hr over 30 Minutes Intravenous On call to O.R. 01/23/21 0959 01/23/21 1952   01/23/21 1157  sodium chloride 0.9 % with cefoTEtan (CEFOTAN) ADS Med       Note to Pharmacy: Devoria Glassing   : cabinet override      01/23/21 1157 01/23/21 1411   01/22/21 0600  ceFAZolin (ANCEF) IVPB 2g/100 mL premix        2 g 200 mL/hr over 30 Minutes Intravenous On call to O.R. 01/21/21 1747 01/22/21 0900   01/14/21 0830  cefTRIAXone (ROCEPHIN) 1 g in sodium  chloride 0.9 % 100 mL IVPB        1 g 200 mL/hr over 30 Minutes Intravenous Every 24 hours 01/14/21 0735 01/16/21 1023   01/14/21 0800  fosfomycin (MONUROL) packet 3 g  Status:  Discontinued        3 g Oral  Once 01/14/21 0711 01/14/21 0734       Subjective:  No nausea vomiting; no chest pain, no shortness of breath.  Reports mild intermittent abdominal discomfort since open gastrostomy tube attempt.  Objective: Vitals:   01/31/21 2010 02/01/21 0500 02/01/21 0630 02/01/21 1437  BP: 123/66  137/66 135/75  Pulse: 97  (!) 110 (!) 103  Resp: 18  16 17   Temp: 98.8 F (37.1 C)  98.6 F (37 C) 99.9 F (37.7 C)  TempSrc: Oral   Oral  SpO2: 100%  99% 100%  Weight:  53.6 kg    Height:        Intake/Output Summary (Last 24 hours) at 02/01/2021 1557 Last data filed at 02/01/2021 0900 Gross per 24 hour  Intake 1264.67 ml  Output --  Net 1264.67 ml   Filed Weights   01/30/21 0500 01/31/21 0500 02/01/21 0500  Weight: 53.7 kg 53.5 kg 53.6 kg    Examination: General exam: Alert, awake, oriented x 3; no chest pain, no nausea, no vomiting.  Reports mild intermittent abdominal discomfort. Respiratory system: Clear to auscultation. Respiratory effort normal.  No using accessory muscle. Cardiovascular system:RRR. No rubs or gallops; no JVD. Gastrointestinal system: Abdomen is nondistended, soft and without guarding; mild discomfort on deep palpation. Central nervous system: Alert and oriented. No focal neurological deficits. Extremities: No cyanosis or clubbing. Skin: No petechiae. Psychiatry: Judgement and insight appear normal.  Flat affect.   Data Reviewed: I have personally reviewed following labs and imaging studies  CBC: Recent Labs  Lab 01/26/21 0432 01/27/21 0341  WBC 8.1 5.6  NEUTROABS  --  5.2  HGB 9.5* 8.8*  HCT 29.6* 28.1*  MCV 100.3* 99.6  PLT 164 155   Basic Metabolic Panel: Recent Labs  Lab 01/26/21 0432 01/27/21 0341 01/29/21 0443 01/31/21 0545  NA 143  144 133* 132*  K 3.4* 3.6 4.3 4.6  CL 109 109 102 103  CO2 29 32 24 25  GLUCOSE 120* 186* 230* 202*  BUN 12 9 13 14   CREATININE 0.44 0.33* 0.34* 0.36*  CALCIUM 8.2* 8.4* 8.5* 8.5*  MG 1.8 1.7 2.1 2.0  PHOS  --  1.3* 3.1 3.5   GFR: Estimated Creatinine Clearance: 46.7 mL/min (A) (by C-G formula based on SCr of 0.36 mg/dL (L)).  Liver Function Tests: Recent Labs  Lab 01/27/21 0341 01/29/21 0443 01/31/21 0545  AST 17 35 47*  ALT 6 19 29   ALKPHOS 56 92  133*  BILITOT 0.5 0.5 0.5  PROT 5.7* 6.0* 5.8*  ALBUMIN 1.9* 1.9* 1.9*   CBG: Recent Labs  Lab 01/31/21 1558 01/31/21 2014 02/01/21 0435 02/01/21 0730 02/01/21 1113  GLUCAP 128* 186* 205* 135* 143*   Sepsis Labs: Recent Labs  Lab 01/26/21 0432  PROCALCITON 0.93    Radiology Studies: No results found.   Scheduled Meds:  chlorhexidine  15 mL Mouth Rinse BID   Chlorhexidine Gluconate Cloth  6 each Topical Daily   free water  30 mL Per Tube Q4H   insulin aspart  0-9 Units Subcutaneous Q6H   mouth rinse  15 mL Mouth Rinse q12n4p   metoprolol tartrate  2.5 mg Intravenous Q6H   pantoprazole sodium  40 mg Per Tube BID   scopolamine  1 patch Transdermal Q72H   sodium chloride flush  10-40 mL Intracatheter Q12H   Continuous Infusions:  ampicillin-sulbactam (UNASYN) IV 3 g (02/01/21 1241)   TPN ADULT (ION) 80 mL/hr at 01/31/21 1733   TPN ADULT (ION)       LOS: 19 days    Time spent: 25 minutes   Vassie Loll, MD [facility]  If 7PM-7AM, please contact night-coverage [email or web address] 02/01/2021, 3:57 PM

## 2021-02-01 NOTE — Progress Notes (Signed)
PHARMACY - TOTAL PARENTERAL NUTRITION CONSULT NOTE   Indication: Intolerance to enteral feeding  Patient Measurements: Height: 5\' 4"  (162.6 cm) Weight: 53.6 kg (118 lb 2.7 oz) IBW/kg (Calculated) : 54.7 TPN AdjBW (KG): 49.7 Body mass index is 20.28 kg/m.  Assessment: Patient aspirated overnight and tube feeds were discontinued- consulted to transition to TPN.  Plan is replacement w/ jejunostomy tube on 02/02/2021.  TPN advanced.Electrolytes replaced and WNL.   Glucose / Insulin: 94-204 ;  6 units insulin Electrolytes: WNL     Na 132    Renal: stable Hepatic: WNL   Central access: PICC line being placed 8/1 TPN start date: 8/1  Nutritional Goals (per RD recommendation on 7/29): kCal: 1600-1795, Protein: 75-80 g, Fluid: 1.6-1.7 liters daily TPN provides 1670 kcal/day. Prot 76.8G; lipids 38.4g, and dextrose 288g  Current Nutrition:  Tube feeds- transitioned to TPN  Plan:  Continue TPN to 80 mL/hr at 1800 Electrolytes in TPN: Na 39mEq/L, K 73mEq/L, Ca 13mEq/L, Mg 7.55mEq/L, and Phos 61mmol/L. Cl:Ac 1:1 Add standard MVI and trace elements to TPN Add 15 units insulin  Sensitive q6h SSI and adjust as needed  Monitor TPN labs on Mon/Thurs,   9m, PharmD Clinical Pharmacist 02/01/2021 8:34 AM

## 2021-02-02 ENCOUNTER — Ambulatory Visit (HOSPITAL_COMMUNITY): Payer: Medicare Other | Attending: Internal Medicine

## 2021-02-02 DIAGNOSIS — K220 Achalasia of cardia: Secondary | ICD-10-CM | POA: Diagnosis not present

## 2021-02-02 DIAGNOSIS — R1310 Dysphagia, unspecified: Secondary | ICD-10-CM | POA: Diagnosis not present

## 2021-02-02 DIAGNOSIS — K5900 Constipation, unspecified: Secondary | ICD-10-CM | POA: Diagnosis not present

## 2021-02-02 DIAGNOSIS — Z431 Encounter for attention to gastrostomy: Secondary | ICD-10-CM | POA: Insufficient documentation

## 2021-02-02 HISTORY — PX: IR GASTR TUBE CONVERT GASTR-JEJ PER W/FL MOD SED: IMG2332

## 2021-02-02 LAB — DIFFERENTIAL
Abs Immature Granulocytes: 0.13 10*3/uL — ABNORMAL HIGH (ref 0.00–0.07)
Basophils Absolute: 0 10*3/uL (ref 0.0–0.1)
Basophils Relative: 0 %
Eosinophils Absolute: 0 10*3/uL (ref 0.0–0.5)
Eosinophils Relative: 0 %
Immature Granulocytes: 1 %
Lymphocytes Relative: 6 %
Lymphs Abs: 0.8 10*3/uL (ref 0.7–4.0)
Monocytes Absolute: 0.5 10*3/uL (ref 0.1–1.0)
Monocytes Relative: 3 %
Neutro Abs: 13.6 10*3/uL — ABNORMAL HIGH (ref 1.7–7.7)
Neutrophils Relative %: 90 %

## 2021-02-02 LAB — COMPREHENSIVE METABOLIC PANEL
ALT: 63 U/L — ABNORMAL HIGH (ref 0–44)
AST: 66 U/L — ABNORMAL HIGH (ref 15–41)
Albumin: 1.9 g/dL — ABNORMAL LOW (ref 3.5–5.0)
Alkaline Phosphatase: 188 U/L — ABNORMAL HIGH (ref 38–126)
Anion gap: 4 — ABNORMAL LOW (ref 5–15)
BUN: 15 mg/dL (ref 8–23)
CO2: 25 mmol/L (ref 22–32)
Calcium: 8.3 mg/dL — ABNORMAL LOW (ref 8.9–10.3)
Chloride: 99 mmol/L (ref 98–111)
Creatinine, Ser: 0.38 mg/dL — ABNORMAL LOW (ref 0.44–1.00)
GFR, Estimated: >60 mL/min (ref >60)
Glucose, Bld: 212 mg/dL — ABNORMAL HIGH (ref 70–99)
Potassium: 4.4 mmol/L (ref 3.5–5.1)
Sodium: 128 mmol/L — ABNORMAL LOW (ref 135–145)
Total Bilirubin: 0.5 mg/dL (ref 0.3–1.2)
Total Protein: 6 g/dL — ABNORMAL LOW (ref 6.5–8.1)

## 2021-02-02 LAB — CBC
HCT: 27.1 % — ABNORMAL LOW (ref 36.0–46.0)
Hemoglobin: 8.8 g/dL — ABNORMAL LOW (ref 12.0–15.0)
MCH: 31.7 pg (ref 26.0–34.0)
MCHC: 32.5 g/dL (ref 30.0–36.0)
MCV: 97.5 fL (ref 80.0–100.0)
Platelets: 255 10*3/uL (ref 150–400)
RBC: 2.78 MIL/uL — ABNORMAL LOW (ref 3.87–5.11)
RDW: 14.4 % (ref 11.5–15.5)
WBC: 15.1 10*3/uL — ABNORMAL HIGH (ref 4.0–10.5)
nRBC: 0 % (ref 0.0–0.2)

## 2021-02-02 LAB — TRIGLYCERIDES: Triglycerides: 72 mg/dL (ref <150)

## 2021-02-02 LAB — GLUCOSE, CAPILLARY
Glucose-Capillary: 206 mg/dL — ABNORMAL HIGH (ref 70–99)
Glucose-Capillary: 190 mg/dL — ABNORMAL HIGH (ref 70–99)
Glucose-Capillary: 115 mg/dL — ABNORMAL HIGH (ref 70–99)

## 2021-02-02 LAB — PREALBUMIN: Prealbumin: 10.3 mg/dL — ABNORMAL LOW (ref 18–38)

## 2021-02-02 LAB — MAGNESIUM: Magnesium: 1.9 mg/dL (ref 1.7–2.4)

## 2021-02-02 LAB — PHOSPHORUS: Phosphorus: 3.2 mg/dL (ref 2.5–4.6)

## 2021-02-02 MED ORDER — FENTANYL CITRATE (PF) 100 MCG/2ML IJ SOLN
INTRAMUSCULAR | Status: AC | PRN
  Administered 2021-02-02: 12.5 ug via INTRAVENOUS
  Administered 2021-02-02: 25 ug via INTRAVENOUS
  Administered 2021-02-02: 12.5 ug via INTRAVENOUS

## 2021-02-02 MED ORDER — IOHEXOL 300 MG/ML  SOLN: 50.0000 mL | Freq: Once | INTRAMUSCULAR | Status: DC | PRN

## 2021-02-02 MED ORDER — MORPHINE SULFATE (PF) 2 MG/ML IV SOLN: 0.5000 mg | INTRAVENOUS | Status: DC | PRN

## 2021-02-02 MED ORDER — TRAVASOL 10 % IV SOLN
INTRAVENOUS | Status: DC
  Filled 2021-02-02: qty 768

## 2021-02-02 MED ORDER — ACETAMINOPHEN 325 MG PO TABS
650.0000 mg | ORAL_TABLET | Freq: Four times a day (QID) | ORAL | Status: DC | PRN
  Administered 2021-02-04 – 2021-02-06 (×2): 650 mg
  Filled 2021-02-02 (×2): qty 2

## 2021-02-02 MED ORDER — OSMOLITE 1.2 CAL PO LIQD
1000.0000 mL | ORAL | Status: DC
  Administered 2021-02-02: 1000 mL
  Filled 2021-02-02 (×2): qty 1000

## 2021-02-02 MED ORDER — MIDAZOLAM HCL 2 MG/2ML IJ SOLN
INTRAMUSCULAR | Status: AC | PRN
  Administered 2021-02-02 (×2): 0.5 mg via INTRAVENOUS

## 2021-02-02 MED ORDER — BACITRACIN-NEOMYCIN-POLYMYXIN 400-5-5000 EX OINT
1.0000 "application " | TOPICAL_OINTMENT | Freq: Every day | CUTANEOUS | Status: DC
  Administered 2021-02-02 – 2021-02-06 (×5): 1 via TOPICAL
  Filled 2021-02-02 (×4): qty 1

## 2021-02-02 NOTE — Progress Notes (Signed)
PROGRESS NOTE    Kathryn [name]  FYB:[id number] DOB: [birth year] DOA: 01/13/2021 PCP: Suzan Slick, MD   Brief Narrative:   Kathryn [name] is a 80 y.o. female with medical history significant for  hypertension, type 2 diabetes mellitus, GERD, positive ANA and SSA autoantibodies, and chronic achalasia (2014 manometry showed abnormal motility)  who presents to the emergency department due to more than 2 months history of decreased oral intake, generalized weakness and about 3 to 4-week onset of abdominal pain associated with difficulty in being able to swallow (solid and liquid).  She believes that she must have lost up to 30 pounds since onset of symptoms.  Patient endorsed that she usually regurgitates any food/liquid ingested within 15 to 20 minutes of ingestion.  Abdominal pain was in the epigastric area and was sharp in nature, this worsens with food.  She denies fever, chills, chest pain, shortness of breath.   She was admitted to the hospital from 12/25/2020-12/28/2020, at that time barium p.o. esophagram done urgently as an outpatient was indicated.  She was also admitted from 10/23/2020 to 10/25/2020 during which patient presented with proximal muscle weakness with an elevated CPK of 1206 that was concerning for polymyalgia rheumatica. This was treated with prednisone with improvement.  EGD done on 10/04/2020 showed normal dilated esophagus and small hiatal hernia.   In the emergency department, she was intermittently tachypneic and tachycardic.  Other vital signs were within normal range.  Work-up in the ED showed normocytic anemia and leukocytosis, BMP was normal except for elevated BUN at and hyperglycemia with CBG of 135.  Albumin 3.2. CT of head without contrast showed no acute intracranial abnormality. IV hydration was provided, hospitalist was asked to admit patient for further evaluation and management.  -Patient continued to experience significant epigastric pain and underwent EGD with  Botox treatment on 7/21.  She continued to have difficulty swallowing and plans were being made for PEG placement which patient was agreeable to.  She underwent open gastrostomy tube placement on 7/28 and then had dislodgment on 7/29 which required subsequent placement on that date.  She started on trickle feeds on 7/30 and feeds were being advanced, but unfortunately on 8/1 she had recurrent aspiration and is now on Unasyn for coverage.  Tube feeds had to be discontinued and plans are for conversion to GJ tube later today, Monday 02/02/21 per IR.  In the meantime, she remains on TPN feeds via PICC line.  Assessment & Plan:   Principal Problem:   Abdominal pain Active Problems:   HTN (hypertension)   Dysphagia   Nausea and vomiting   Generalized weakness   Dehydration   Normocytic anemia   Leukocytosis   Hyperglycemia   Hypoalbuminemia due to protein-calorie malnutrition (HCC)   Prolonged QT interval   Achalasia   Malnutrition of moderate degree   Hypoactive bowel sounds   Dislodged gastrostomy tube   Constipation   Pressure injury of skin   Epigastric/Abdominal pain, nausea and vomiting in the setting of esophageal dysphagia Patient is ANA/SSA antibody positive. Sjogren's syndrome and SLE are on the differential. Patient also has had increased oral secretion, suggestive of sicca-related symptoms. Gastrointestinal involvement of SLE can manifest as esophageal motility disorder, such as achalasia, which has been suggesting in this patient. Other GI manifestations of SLE include acute pancreatitis, for which  patient signs and symptoms are concerning, but CT 6/30 has shown no acute pancreatic findings. EGD in April 2022 showed unremarkable esophagus, and patient underwent dialtion  given history of dysphagia, but patient states that did not help with symptoms.   -Continue IV morphine 0.5 mg q.4h p.r.n. for severe pain -Continue Protonix 40 mg po BID -Continue clear liquid diet with plan to  advanced diet as tolerated -Gastroenterology following for further recommendations -ongoing goals of care discussions with palliative team, patient and daughter regarding tube feeding -Patient has undergone G-tube placement on 7/28 with dislodgment noted on 7/29 which has undergone replacement -She unfortunately developed aspiration pneumonia overnight and is on Unasyn now for coverage.  Tube feeds have been discontinued.  PICC line placed 8/1 and TPN initiated.   -Given transportation issues patient was not transferred to: For GJ tube conversion by IR as previously scheduled on 01/30/21; patient will be transfer today 02/02/21 for GJ conversion. -She had fecal impaction on 8/1 that was mechanically disimpacted. -Continue TPN.  Aspiration pneumonia -Cover with Unasyn and monitor,last day of antibiotics today. -No requiring oxygen supplementation no using accessory muscles. Follow tolerance.   Generalized weakness and dehydration in the setting of above -Hypoalbuminemia secondary to mild protein calorie malnutrition -Continue IV hydration as described above -Continue protein supplement -surgery consulted for open PEG placement on 7/29   Urinary tract infection - TREATED - UA in the ED showed many bacteria, moderate ketones, nitrites -Patient has completed IV antibiotic therapy on 01/17/2021.  Normocytic anemia -Chronic and Stable   COVID-19 infection (incidental finding) -Patient is asymptomatic and stable on room air -This was already diagnosed during last admission and no treatment has been required; patient is out of isolation at this time.   Essential hypertension -Continue Lopressor but since can't take tabs now, we changed it to IV lopressor 2.5 mg Q6h until she can take the tablets again  Hyperglycemia secondary to T2DM -Hemoglobin A1c on 6/30-5.8%   GERD -Continue Protonix  DNR -Continue to treat what is treatable -No intubation, no mechanical ventilatory support and no  resuscitation. -Wishes and goals of care discussion will be respected.  Physical deconditioning -Patient has been seen by physical therapy with recommendation to skilled nursing facility placement at discharge for rehabilitation and care.     DVT prophylaxis:SCD Code Status: DNR Family Communication: Daughter at bedside 02/02/2021. Disposition Plan:  Status is: Inpatient   Remains inpatient appropriate because:Ongoing diagnostic testing needed not appropriate for outpatient work up and IV treatments appropriate due to intensity of illness or inability to take PO   Dispo: The patient is from: Home              Anticipated d/c is to: SNF              Patient currently is not medically stable to d/c.              Difficult to place patient No   Consultants:  Gastroenterology Palliative care Surgery (regarding PEG placement) IR   Procedures:  Barium swallow 7/20 EGD with botox injection 7/21 Open gastrostomy tube placement with lysis of adhesions and small bowel resection 7/28 Repeat placement of open gastrostomy tube on 7/29 PICC line for TPN 8/1  Antimicrobials:  Anti-infectives (From admission, onward)    Start     Dose/Rate Route Frequency Ordered Stop   01/26/21 0100  Ampicillin-Sulbactam (UNASYN) 3 g in sodium chloride 0.9 % 100 mL IVPB        3 g 200 mL/hr over 30 Minutes Intravenous Every 6 hours 01/26/21 0009 02/02/21 2359   01/26/21 0100  doxycycline (VIBRAMYCIN) 100 mg in sodium chloride 0.9 %  250 mL IVPB  Status:  Discontinued        100 mg 125 mL/hr over 120 Minutes Intravenous Every 12 hours 01/26/21 0009 01/26/21 0745   01/23/21 1300  cefoTEtan (CEFOTAN) 2 g in sodium chloride 0.9 % 100 mL IVPB        2 g 200 mL/hr over 30 Minutes Intravenous On call to O.R. 01/23/21 0959 01/23/21 1952   01/23/21 1157  sodium chloride 0.9 % with cefoTEtan (CEFOTAN) ADS Med       Note to Pharmacy: Devoria Glassing   : cabinet override      01/23/21 1157 01/23/21 1411   01/22/21  0600  ceFAZolin (ANCEF) IVPB 2g/100 mL premix        2 g 200 mL/hr over 30 Minutes Intravenous On call to O.R. 01/21/21 1747 01/22/21 0900   01/14/21 0830  cefTRIAXone (ROCEPHIN) 1 g in sodium chloride 0.9 % 100 mL IVPB        1 g 200 mL/hr over 30 Minutes Intravenous Every 24 hours 01/14/21 0735 01/16/21 1023   01/14/21 0800  fosfomycin (MONUROL) packet 3 g  Status:  Discontinued        3 g Oral  Once 01/14/21 0711 01/14/21 0734       Subjective:  No chest pain, no nausea, no vomiting, no shortness of breath.  Patient is afebrile.  No overnight events reported.  Objective: Vitals:   02/01/21 2044 02/02/21 0505 02/02/21 0839 02/02/21 0957  BP: (!) 149/74 (!) 109/55 (!) 94/51 (!) 106/59  Pulse: (!) 103 (!) 111 (!) 115 (!) 109  Resp: 18 18 20 10   Temp: 98.1 F (36.7 C) 99.1 F (37.3 C) 99.1 F (37.3 C)   TempSrc: Oral Oral Oral   SpO2: 100% 97% 100% 100%  Weight:      Height:        Intake/Output Summary (Last 24 hours) at 02/02/2021 1102 Last data filed at 02/02/2021 0300 Gross per 24 hour  Intake 2282.91 ml  Output --  Net 2282.91 ml   Filed Weights   01/30/21 0500 01/31/21 0500 02/01/21 0500  Weight: 53.7 kg 53.5 kg 53.6 kg    Examination: General exam: Alert, awake, oriented x 3, denies chest pain, no shortness of breath, no nausea or vomiting.  So far tolerating TPN without difficulties.  Ready for GJ conversion by IR. Respiratory system: Clear to auscultation. Respiratory effort normal.  No accessory muscle. Cardiovascular system: Sinus tachycardia, no rubs, no gallops, no JVD. Gastrointestinal system: Abdomen is soft, nondistended, positive bowel sounds.  No guarding Central nervous system: No focal neurological deficits. Extremities: No cyanosis or clubbing. Skin: No petechiae. Psychiatry: Flat affect; stable mood.  Judgment and insight appear to be normal.   Data Reviewed: I have personally reviewed following labs and imaging studies  CBC: Recent Labs  Lab  01/27/21 0341 02/02/21 0528  WBC 5.6 15.1*  NEUTROABS 5.2 13.6*  HGB 8.8* 8.8*  HCT 28.1* 27.1*  MCV 99.6 97.5  PLT 155 255   Basic Metabolic Panel: Recent Labs  Lab 01/27/21 0341 01/29/21 0443 01/31/21 0545 02/02/21 0528  NA 144 133* 132* 128*  K 3.6 4.3 4.6 4.4  CL 109 102 103 99  CO2 32 24 25 25   GLUCOSE 186* 230* 202* 212*  BUN 9 13 14 15   CREATININE 0.33* 0.34* 0.36* 0.38*  CALCIUM 8.4* 8.5* 8.5* 8.3*  MG 1.7 2.1 2.0 1.9  PHOS 1.3* 3.1 3.5 3.2   GFR: Estimated Creatinine Clearance: 46.7  mL/min (A) (by C-G formula based on SCr of 0.38 mg/dL (L)).  Liver Function Tests: Recent Labs  Lab 01/27/21 0341 01/29/21 0443 01/31/21 0545 02/02/21 0528  AST 17 35 47* 66*  ALT 6 19 29  63*  ALKPHOS 56 92 133* 188*  BILITOT 0.5 0.5 0.5 0.5  PROT 5.7* 6.0* 5.8* 6.0*  ALBUMIN 1.9* 1.9* 1.9* 1.9*   CBG: Recent Labs  Lab 02/01/21 0730 02/01/21 1113 02/01/21 1622 02/01/21 2045 02/02/21 0555  GLUCAP 135* 143* 143* 162* 206*   Radiology Studies: No results found.   Scheduled Meds:  chlorhexidine  15 mL Mouth Rinse BID   Chlorhexidine Gluconate Cloth  6 each Topical Daily   free water  30 mL Per Tube Q4H   insulin aspart  0-9 Units Subcutaneous Q6H   mouth rinse  15 mL Mouth Rinse q12n4p   metoprolol tartrate  2.5 mg Intravenous Q6H   pantoprazole sodium  40 mg Per Tube BID   scopolamine  1 patch Transdermal Q72H   sodium chloride flush  10-40 mL Intracatheter Q12H   Continuous Infusions:  ampicillin-sulbactam (UNASYN) IV 3 g (02/02/21 0126)   TPN ADULT (ION) 80 mL/hr at 02/01/21 1847   TPN ADULT (ION)       LOS: 20 days    Time spent: 25 minutes   04/03/21, MD [facility]  If 7PM-7AM, please contact night-coverage [email or web address] 02/02/2021, 11:02 AM

## 2021-02-02 NOTE — Sedation Documentation (Signed)
Procedure finished. Pt tolerated very well. Report called to [facility] RN and [facility] RN  Totals: Time: 43 mins Fentanyl 50 mcg Versed 1mg   Awaiting [facility] transport back to [facility]. VSS, Will continue to monitor

## 2021-02-02 NOTE — Progress Notes (Signed)
PHARMACY - TOTAL PARENTERAL NUTRITION CONSULT NOTE   Indication: Intolerance to enteral feeding  Patient Measurements: Height: 5\' 4"  (162.6 cm) Weight: 53.6 kg (118 lb 2.7 oz) IBW/kg (Calculated) : 54.7 TPN AdjBW (KG): 49.7 Body mass index is 20.28 kg/m.  Assessment: Patient aspirated overnight and tube feeds were discontinued- consulted to transition to TPN.  Plan is replacement w/ jejunostomy tube on 02/02/2021.  TPN advanced.Electrolytes replaced and WNL.   Glucose / Insulin: 143-212 ;  6 units insulin Electrolytes: WNL     Na 128    Renal: stable Hepatic: AST 66  ALT 63   Central access: PICC line being placed 8/1 TPN start date: 8/1  Nutritional Goals (per RD recommendation on 7/29): kCal: 1600-1795, Protein: 75-80 g, Fluid: 1.6-1.7 liters daily TPN provides 1670 kcal/day. Prot 76.8G; lipids 38.4g, and dextrose 288g  Current Nutrition:  Tube feeds- transitioned to TPN  Plan:  Continue TPN to 80 mL/hr at 1800 Electrolytes in TPN: Na 65 mEq/L, K 40 mEq/L, Ca 59mEq/L, Mg 10 mEq/L, and Phos 74mmol/L. Cl:Ac 1:1 Add standard MVI and trace elements to TPN Add 20 units insulin  Sensitive q6h SSI and adjust as needed  Monitor TPN labs on Mon/Thurs,   9m, PharmD Clinical Pharmacist 02/02/2021 8:27 AM

## 2021-02-02 NOTE — Plan of Care (Signed)
  Problem: Acute Rehab PT Goals(only PT should resolve) Goal: Pt Will Go Supine/Side To Sit Outcome: Progressing Flowsheets (Taken 02/02/2021 0829) Pt will go Supine/Side to Sit: with minimal assist Goal: Pt Will Go Sit To Supine/Side Outcome: Progressing Flowsheets (Taken 02/02/2021 0829) Pt will go Sit to Supine/Side: with minimal assist Goal: Patient Will Transfer Sit To/From Stand Outcome: Progressing Flowsheets (Taken 02/02/2021 0829) Patient will transfer sit to/from stand: with minimal assist Goal: Pt Will Transfer Bed To Chair/Chair To Bed Outcome: Progressing Flowsheets (Taken 02/02/2021 0829) Pt will Transfer Bed to Chair/Chair to Bed: with min assist Goal: Pt Will Ambulate Outcome: Progressing Flowsheets (Taken 02/02/2021 0829) Pt will Ambulate:  25 feet  with minimal assist  with least restrictive assistive device  8:30 AM, 02/02/21 Wyman Songster PT, DPT Physical Therapist at [facility]

## 2021-02-02 NOTE — Progress Notes (Signed)
Patient transferred to [facility] for procedure. Transported by [facility]. Paperwork given to [employer]. Daughter aware of transfer.

## 2021-02-02 NOTE — Procedures (Signed)
Interventional Radiology Procedure Note  Procedure:   Conversion of surgical gastrostomy to a gastro-jejunostomy. 7F balloon retention. ~5cc into the balloon.   Findings: Acute angle from surgical g to the pylorus.  43F omni flush required to make turn.   Complications: None  Recommendations: - OK to use - Routine wound care  Signed,   Yvone Neu. Loreta Ave, DO

## 2021-02-02 NOTE — Progress Notes (Signed)
   02/02/21 0839  Assess: MEWS Score  Temp 99.1 F (37.3 C)  BP (!) 94/51  Pulse Rate (!) 115  Resp 20  SpO2 100 %  O2 Device Room Air  Assess: MEWS Score  MEWS Temp 0  MEWS Systolic 1  MEWS Pulse 2  MEWS RR 0  MEWS LOC 0  MEWS Score 3  MEWS Score Color Yellow  Assess: if the MEWS score is Yellow or Red  Were vital signs taken at a resting state? Yes  Focused Assessment No change from prior assessment  Early Detection of Sepsis Score *See Row Information* Medium  MEWS guidelines implemented *See Row Information* No, other (Comment) (Patient transfered to [facility] for procedure. MD Gwenlyn Perking aware of yellow MEWS)  Treat  Pain Scale 0-10  Pain Score 0  Take Vital Signs  Increase Vital Sign Frequency  Yellow: Q 2hr X 2 then Q 4hr X 2, if remains yellow, continue Q 4hrs  Escalate  MEWS: Escalate Yellow: discuss with charge nurse/RN and consider discussing with provider and RRT  Notify: Charge Nurse/RN  Name of Charge Nurse/RN Notified Chales Abrahams RN  Date Charge Nurse/RN Notified 02/02/21  Time Charge Nurse/RN Notified 0630  Notify: Provider  Provider Name/Title MD Gwenlyn Perking  Date Provider Notified 02/02/21  Time Provider Notified 0840  Notification Type Face-to-face  Notification Reason Change in status  Provider response At bedside  Date of Provider Response 02/02/21  Time of Provider Response [phone]

## 2021-02-02 NOTE — Progress Notes (Addendum)
Nutrition Follow-up  DOCUMENTATION CODES:   Non-severe (moderate) malnutrition in context of chronic illness  INTERVENTION:  Osmolite 1.2 @ 20 ml/hr via PEG and increase by 10 ml every 8 hours to goal rate of 55 ml/hr.  Patient will need free water flushes- 250 ml BID per tube if not receiving IVF   Tube feeding regimen provides 1584 kcal, 73.2 grams of protein, and 1082 ml of H2O.   Recommend daily weights with discharge to next venue     NUTRITION DIAGNOSIS:   Moderate Malnutrition related to chronic illness (achalasia) as evidenced by energy intake < or equal to 75% for > or equal to 1 month, mild fat depletion, mild muscle depletion. - receiving TPN and plans to resume enteral nutrition once PEG-J is placed  GOAL:  Patient will meet greater than or equal to 90% of their needs -progressing   MONITOR:  Diet advancement, Supplement acceptance, PO intake, Weight trends   ASSESSMENT: patient is a 80 yo female with history of DM-2, HTN, GERD and chronic achalasia. Patient presents with epigastric pain, weakness and dehydration.   Positive ANA and SSA antibody (to follow up with Rheumatology).  Patient is currently NPO. Poor tolerance of oral intake per chart review. No family at bedside during RD visit. Expect pt meeting < 75% of energy needs the past month based on chart review. RD assessed pt on 7/1 and she was eating very little at that time. Question if pt a candidate for tube feeding if chronically unable to meet nutrition needs orally.   Weight loss reported per chart review. Patient weight 10/24/20 -56.7 kg (124.7 lb) and currently 54.4 kg (119.6 lb).    8/5 patient receiving TPN and was to have conversion of G- a GJ tube today IR. Procedure has been rescheduled for Monday 8/8. TPN @ 80 ml/hr meeting 100% estimated nutrition needs. [4008 kcal, 76.8 gr protein, 38.4 gr lipids]. Current weight 53.7 kg.  8/8 Patient has returned from procedure IR- Conversion of surgical  gastrostomy to a gastro-jejunostomy. Per MD- OK to use. Weights reviewed. Patient weight gain of 3.9 kg since admission. Expect related to fluid status change. Re-estimated nutrition needs.  8/9 Patient tube feeding started and tolerating at 20 ml/hr. Weight updated and reviewed. Patient planned disposition is to SNF possibly tomorrow. Hyponatremia noted.   Medications reviewed and include:  protonix.  Intake/Output Summary (Last 24 hours) at 02/02/2021 1634 Last data filed at 02/02/2021 0300 Gross per 24 hour  Intake 2282.91 ml  Output --  Net 2282.91 ml    Labs: BMP Latest Ref Rng & Units 02/02/2021 01/31/2021 01/29/2021  Glucose 70 - 99 mg/dL 676(P) 950(D) 326(Z)  BUN 8 - 23 mg/dL 15 14 13   Creatinine 0.44 - 1.00 mg/dL ) 1.24(P) 8.09(X)  Sodium 135 - 145 mmol/L 128(L) 132(L) 133(L)  Potassium 3.5 - 5.1 mmol/L 4.4 4.6 4.3  Chloride 98 - 111 mmol/L 99 103 102  CO2 22 - 32 mmol/L 25 25 24   Calcium 8.9 - 10.3 mg/dL 8.3(L) 8.5(L) 8.5(L)      NUTRITION - FOCUSED PHYSICAL EXAM: Nutrition-Focused physical exam completed. Findings are mild upper arm fat depletion, moderate clavicle and mild temporal muscle depletion, and no edema.     Diet Order:   Diet Order             Diet NPO time specified Except for: Sips with Meds  Diet effective midnight  EDUCATION NEEDS:  Not appropriate for education at this time  Skin:  Skin Assessment: Reviewed RN Assessment  Last BM:  8/8 type 7   Height:   Ht Readings from Last 1 Encounters:  01/23/21 5\' 4"  (1.626 m)    Weight:   Wt Readings from Last 1 Encounters:  02/01/21 53.6 kg  Admission wt 49.7 kg  Ideal Body Weight:   52 kg  BMI:  Body mass index is 20.28 kg/m.  Estimated Nutritional Needs:   Kcal:  1500-1700  Protein:  70-75 gr  Fluid:  1500 ml daily  04/03/21 MS,RD,CSG,LDN Contact: Royann Shivers

## 2021-02-02 NOTE — Progress Notes (Signed)
Patient returned to [facility], transported by [facility]. Patients vital signs stable. Reports no complaints of pain/discomfort. Notified Daughter of patients return to [facility].

## 2021-02-02 NOTE — Sedation Documentation (Signed)
Patient is resting comfortably. 

## 2021-02-02 NOTE — Sedation Documentation (Signed)
Vital signs stable. 

## 2021-02-02 NOTE — Sedation Documentation (Signed)
Patient is resting comfortably. Procedure started °

## 2021-02-02 NOTE — Sedation Documentation (Signed)
Report called to [facility] nurse Babs Sciara. [facility] here to pick up pt at this time. Vitals stable.

## 2021-02-02 NOTE — Sedation Documentation (Signed)
Patient is resting comfortably. Procedure continues 

## 2021-02-02 NOTE — Evaluation (Signed)
Physical Therapy Evaluation Patient Details Name: Kathryn [name] MRN: [id number] DOB: Sep 09, 1940 Today's Date: 02/02/2021   History of Present Illness  Kathryn [name] is a 80 y.o. female with medical history significant for  hypertension, type 2 diabetes mellitus, GERD, positive ANA and SSA autoantibodies who presents to the emergency department due to more than 2 months history of decreased oral intake, generalized weakness and about 3 to 4-week onset of abdominal pain associated with difficulty in being able to swallow (solid and liquid).  She believes that she must have lost up to 30 pounds since onset of symptoms.  Patient endorsed that she usually regurgitates any food/liquid ingested within 15 to 20 minutes of ingestion.  Abdominal pain was in the epigastric area and was sharp in nature, this worsens with food.  She denies fever, chills, chest pain, shortness of breath.  She was admitted to the hospital from 12/25/2020-12/28/2020, at that time barium p.o. esophagram done urgently as an outpatient was indicated.  She was also admitted from 10/23/2020 to 10/25/2020 during which patient presented with proximal muscle weakness with an elevated CPK of 1206, this was treated with prednisone with improvement.  EGD done on 10/04/2020 showed normal dilated esophagus and small hiatal hernia.   Clinical Impression  Patient limited for functional mobility as stated below secondary to BLE weakness, fatigue and poor balance. Patient requires assist for LE movement and for pulling to seated EOB. Patient unable to transition to full sitting position secondary to weakness despite assist and trunk support. Patient returned back to supine at end of session with fatigue noted. Patient will benefit from continued physical therapy in hospital and recommended venue below to increase strength, balance, endurance for safe ADLs and gait.     Follow Up Recommendations SNF    Equipment Recommendations  None recommended by PT     Recommendations for Other Services       Precautions / Restrictions Precautions Precautions: Fall Restrictions Weight Bearing Restrictions: No      Mobility  Bed Mobility Overal bed mobility: Needs Assistance Bed Mobility: Supine to Sit;Sit to Supine     Supine to sit: Mod assist;Max assist Sit to supine: Mod assist;Max assist   General bed mobility comments: labored movement, requires assist for LE movement and assist to pull to sitting    Transfers                    Ambulation/Gait                Stairs            Wheelchair Mobility    Modified Rankin (Stroke Patients Only)       Balance Overall balance assessment: Needs assistance Sitting-balance support: No upper extremity supported;Feet unsupported Sitting balance-Leahy Scale: Poor                                       Pertinent Vitals/Pain Pain Assessment: No/denies pain    Home Living Family/patient expects to be discharged to:: Private residence Living Arrangements: Alone Available Help at Discharge: Family;Available 24 hours/day Type of Home: House Home Access: Level entry     Home Layout: One level Home Equipment: Walker - 2 wheels;Shower seat      Prior Function Level of Independence: Needs assistance   Gait / Transfers Assistance Needed: household ambulator using RW  ADL's / Homemaking Assistance Needed: assisted by  family        Hand Dominance        Extremity/Trunk Assessment   Upper Extremity Assessment Upper Extremity Assessment: Generalized weakness    Lower Extremity Assessment Lower Extremity Assessment: Generalized weakness    Cervical / Trunk Assessment Cervical / Trunk Assessment: Normal  Communication   Communication: No difficulties  Cognition Arousal/Alertness: Awake/alert Behavior During Therapy: WFL for tasks assessed/performed Overall Cognitive Status: Within Functional Limits for tasks assessed                                         General Comments      Exercises     Assessment/Plan    PT Assessment Patient needs continued PT services  PT Problem List Decreased strength;Decreased activity tolerance;Decreased balance;Decreased mobility       PT Treatment Interventions DME instruction;Gait training;Stair training;Functional mobility training;Therapeutic activities;Therapeutic exercise;Patient/family education;Balance training    PT Goals (Current goals can be found in the Care Plan section)  Acute Rehab PT Goals Patient Stated Goal: get stronger PT Goal Formulation: With patient/family Time For Goal Achievement: 02/16/21 Potential to Achieve Goals: Good    Frequency Min 3X/week   Barriers to discharge        Co-evaluation               AM-PAC PT "6 Clicks" Mobility  Outcome Measure Help needed turning from your back to your side while in a flat bed without using bedrails?: A Little Help needed moving from lying on your back to sitting on the side of a flat bed without using bedrails?: A Lot Help needed moving to and from a bed to a chair (including a wheelchair)?: A Lot Help needed standing up from a chair using your arms (e.g., wheelchair or bedside chair)?: A Lot Help needed to walk in hospital room?: A Lot Help needed climbing 3-5 steps with a railing? : Total 6 Click Score: 12    End of Session   Activity Tolerance: Patient tolerated treatment well;Patient limited by fatigue Patient left: in bed;with family/visitor present;with call bell/phone within reach Nurse Communication: Mobility status PT Visit Diagnosis: Unsteadiness on feet (R26.81);Other abnormalities of gait and mobility (R26.89);Muscle weakness (generalized) (M62.81)    Time: 0932-3557 PT Time Calculation (min) (ACUTE ONLY): 14 min   Charges:   PT Evaluation $PT Eval Moderate Complexity: 1 Mod          8:28 AM, 02/02/21 Wyman Songster PT, DPT Physical Therapist at  [facility]

## 2021-02-03 DIAGNOSIS — K5900 Constipation, unspecified: Secondary | ICD-10-CM | POA: Diagnosis not present

## 2021-02-03 DIAGNOSIS — Z95828 Presence of other vascular implants and grafts: Secondary | ICD-10-CM

## 2021-02-03 DIAGNOSIS — I10 Essential (primary) hypertension: Secondary | ICD-10-CM | POA: Diagnosis not present

## 2021-02-03 DIAGNOSIS — K220 Achalasia of cardia: Secondary | ICD-10-CM | POA: Diagnosis not present

## 2021-02-03 DIAGNOSIS — R1310 Dysphagia, unspecified: Secondary | ICD-10-CM | POA: Diagnosis not present

## 2021-02-03 LAB — GLUCOSE, CAPILLARY
Glucose-Capillary: 185 mg/dL — ABNORMAL HIGH (ref 70–99)
Glucose-Capillary: 214 mg/dL — ABNORMAL HIGH (ref 70–99)
Glucose-Capillary: 167 mg/dL — ABNORMAL HIGH (ref 70–99)
Glucose-Capillary: 172 mg/dL — ABNORMAL HIGH (ref 70–99)

## 2021-02-03 LAB — COMPREHENSIVE METABOLIC PANEL
ALT: 46 U/L — ABNORMAL HIGH (ref 0–44)
AST: 36 U/L (ref 15–41)
Albumin: 1.8 g/dL — ABNORMAL LOW (ref 3.5–5.0)
Alkaline Phosphatase: 152 U/L — ABNORMAL HIGH (ref 38–126)
Anion gap: 5 (ref 5–15)
BUN: 18 mg/dL (ref 8–23)
CO2: 24 mmol/L (ref 22–32)
Calcium: 8.2 mg/dL — ABNORMAL LOW (ref 8.9–10.3)
Chloride: 102 mmol/L (ref 98–111)
Creatinine, Ser: 0.38 mg/dL — ABNORMAL LOW (ref 0.44–1.00)
GFR, Estimated: >60 mL/min (ref >60)
Glucose, Bld: 211 mg/dL — ABNORMAL HIGH (ref 70–99)
Potassium: 4.6 mmol/L (ref 3.5–5.1)
Sodium: 131 mmol/L — ABNORMAL LOW (ref 135–145)
Total Bilirubin: 0.4 mg/dL (ref 0.3–1.2)
Total Protein: 5.7 g/dL — ABNORMAL LOW (ref 6.5–8.1)

## 2021-02-03 MED ORDER — SODIUM CHLORIDE 0.9 % IV BOLUS
500.0000 mL | Freq: Once | INTRAVENOUS | Status: AC
  Administered 2021-02-03: 500 mL via INTRAVENOUS

## 2021-02-03 NOTE — Progress Notes (Signed)
PROGRESS NOTE    Kathryn [name]  VEL:[id number] DOB: Sep 13, 1940 DOA: 01/13/2021 PCP: Suzan Slick, MD   Brief Narrative:   Kathryn [name] is a 80 y.o. female with medical history significant for  hypertension, type 2 diabetes mellitus, GERD, positive ANA and SSA autoantibodies, and chronic achalasia (2014 manometry showed abnormal motility)  who presents to the emergency department due to more than 2 months history of decreased oral intake, generalized weakness and about 3 to 4-week onset of abdominal pain associated with difficulty in being able to swallow (solid and liquid).  She believes that she must have lost up to 30 pounds since onset of symptoms.  Patient endorsed that she usually regurgitates any food/liquid ingested within 15 to 20 minutes of ingestion.  Abdominal pain was in the epigastric area and was sharp in nature, this worsens with food.  She denies fever, chills, chest pain, shortness of breath.   She was admitted to the hospital from 12/25/2020-12/28/2020, at that time barium p.o. esophagram done urgently as an outpatient was indicated.  She was also admitted from 10/23/2020 to 10/25/2020 during which patient presented with proximal muscle weakness with an elevated CPK of 1206 that was concerning for polymyalgia rheumatica. This was treated with prednisone with improvement.  EGD done on 10/04/2020 showed normal dilated esophagus and small hiatal hernia.   In the emergency department, she was intermittently tachypneic and tachycardic.  Other vital signs were within normal range.  Work-up in the ED showed normocytic anemia and leukocytosis, BMP was normal except for elevated BUN at and hyperglycemia with CBG of 135.  Albumin 3.2. CT of head without contrast showed no acute intracranial abnormality. IV hydration was provided, hospitalist was asked to admit patient for further evaluation and management.  -Patient continued to experience significant epigastric pain and underwent EGD with  Botox treatment on 7/21.  She continued to have difficulty swallowing and plans were being made for PEG placement which patient was agreeable to.  She underwent open gastrostomy tube placement on 7/28 and then had dislodgment on 7/29 which required subsequent placement on that date.  She started on trickle feeds on 7/30 and feeds were being advanced, but unfortunately on 8/1 she had recurrent aspiration and is now on Unasyn for coverage.  Tube feeds had to be discontinued and status post GJ tube conversion by IR; maximizing tube feedings and discontinue TPN.  Patient will need a skilled nursing facility at discharge for rehabilitation and further care.  Assessment & Plan:   Principal Problem:   Abdominal pain Active Problems:   HTN (hypertension)   Dysphagia   Nausea and vomiting   Generalized weakness   Dehydration   Normocytic anemia   Leukocytosis   Hyperglycemia   Hypoalbuminemia due to protein-calorie malnutrition (HCC)   Prolonged QT interval   Achalasia   Malnutrition of moderate degree   Hypoactive bowel sounds   Dislodged gastrostomy tube   Constipation   Pressure injury of skin   Epigastric/Abdominal pain, nausea and vomiting in the setting of esophageal dysphagia Patient is ANA/SSA antibody positive. Sjogren's syndrome and SLE are on the differential. Patient also has had increased oral secretion, suggestive of sicca-related symptoms. Gastrointestinal involvement of SLE can manifest as esophageal motility disorder, such as achalasia, which has been suggesting in this patient. Other GI manifestations of SLE include acute pancreatitis, for which  patient signs and symptoms are concerning, but CT 6/30 has shown no acute pancreatic findings. EGD in April 2022 showed unremarkable esophagus, and  patient underwent dialtion given history of dysphagia, but patient states that did not help with symptoms.   -Continue IV morphine 0.5 mg q.4h p.r.n. for severe pain -Continue Protonix 40 mg  po BID -Continue clear liquid diet with plan to advanced diet as tolerated -Gastroenterology following for further recommendations -ongoing goals of care discussions with palliative team, patient and daughter regarding tube feeding -Patient has undergone G-tube placement on 7/28 with dislodgment noted on 7/29 which has undergone replacement -She unfortunately developed aspiration pneumonia overnight and is on Unasyn now for coverage.  Tube feeds have been discontinued.  PICC line placed 8/1 and TPN initiated.   -Given transportation issues patient was not transferred to: For GJ tube conversion by IR as previously scheduled on 01/30/21; patient was transferred on 02/02/21 for GJ tube conversion by IR and well-tolerated.  At this moment working on maximizing tube feedings.  We will discontinue TPN.  Aspiration pneumonia -Patient has completed treatment with Unasyn. -No requiring oxygen supplementation no using accessory muscles. Follow tolerance.   Generalized weakness and dehydration in the setting of above -Hypoalbuminemia secondary to mild protein calorie malnutrition -Continue IV hydration as described above -Continue protein supplement -surgery consulted for open PEG placement on 7/29   Urinary tract infection - TREATED - UA in the ED showed many bacteria, moderate ketones, nitrites -Patient has completed IV antibiotic therapy on 01/17/2021.  Normocytic anemia -Chronic and Stable   COVID-19 infection (incidental finding) -Patient is asymptomatic and stable on room air -This was already diagnosed during last admission and no treatment has been required; patient is out of isolation at this time.   Essential hypertension -Continue Lopressor, currently IV -Will assess tolerance of patient's GJ tube feedings before transitioning medications to the route.  Hyperglycemia secondary to T2DM -Hemoglobin A1c on 6/30-5.8% -Continue sliding scale insulin for elevated CBGs in the setting of TPN  and tube feedings.   GERD -Continue Protonix  DNR -Continue to treat what is treatable -No intubation, no mechanical ventilatory support and no resuscitation. -Wishes and goals of care discussion will be respected.  Physical deconditioning -Patient has been seen by physical therapy with recommendation to skilled nursing facility placement at discharge for rehabilitation and care. -Discussed with daughter at bedside, in agreement with rehabilitation placement. -TOC has been made aware.     DVT prophylaxis:SCD Code Status: DNR Family Communication: Daughter at bedside 02/03/2021. Disposition Plan:  Status is: Inpatient   Remains inpatient appropriate because:Ongoing diagnostic testing needed not appropriate for outpatient work up and IV treatments appropriate due to intensity of illness or inability to take PO   Dispo: The patient is from: Home              Anticipated d/c is to: SNF              Patient currently is not medically stable to d/c.              Difficult to place patient No   Consultants:  Gastroenterology Palliative care Surgery (regarding PEG placement) IR   Procedures:  Barium swallow 7/20 EGD with botox injection 7/21 Open gastrostomy tube placement with lysis of adhesions and small bowel resection 7/28 Repeat placement of open gastrostomy tube on 7/29 PICC line for TPN 8/1  Antimicrobials:  Anti-infectives (From admission, onward)    Start     Dose/Rate Route Frequency Ordered Stop   01/26/21 0100  Ampicillin-Sulbactam (UNASYN) 3 g in sodium chloride 0.9 % 100 mL IVPB  3 g 200 mL/hr over 30 Minutes Intravenous Every 6 hours 01/26/21 0009 02/03/21 0259   01/26/21 0100  doxycycline (VIBRAMYCIN) 100 mg in sodium chloride 0.9 % 250 mL IVPB  Status:  Discontinued        100 mg 125 mL/hr over 120 Minutes Intravenous Every 12 hours 01/26/21 0009 01/26/21 0745   01/23/21 1300  cefoTEtan (CEFOTAN) 2 g in sodium chloride 0.9 % 100 mL IVPB        2  g 200 mL/hr over 30 Minutes Intravenous On call to O.R. 01/23/21 0959 01/23/21 1952   01/23/21 1157  sodium chloride 0.9 % with cefoTEtan (CEFOTAN) ADS Med       Note to Pharmacy: Devoria GlassingMoore, Kimberly   : cabinet override      01/23/21 1157 01/23/21 1411   01/22/21 0600  ceFAZolin (ANCEF) IVPB 2g/100 mL premix        2 g 200 mL/hr over 30 Minutes Intravenous On call to O.R. 01/21/21 1747 01/22/21 0900   01/14/21 0830  cefTRIAXone (ROCEPHIN) 1 g in sodium chloride 0.9 % 100 mL IVPB        1 g 200 mL/hr over 30 Minutes Intravenous Every 24 hours 01/14/21 0735 01/16/21 1023   01/14/21 0800  fosfomycin (MONUROL) packet 3 g  Status:  Discontinued        3 g Oral  Once 01/14/21 0711 01/14/21 0734       Subjective:  No chest pain, no nausea, no vomiting, no shortness of breath.  No overnight events.  Patient tolerated GJ tube conversion on 02/02/21 without complications and so far is tolerating tube feedings.  Objective: Vitals:   02/02/21 2103 02/03/21 0500 02/03/21 0554 02/03/21 1258  BP: (!) 96/59  (!) 112/57 123/61  Pulse: 93  93 (!) 109  Resp: 18  18 16   Temp: 98.8 F (37.1 C)  98.3 F (36.8 C) 98.3 F (36.8 C)  TempSrc:    Oral  SpO2: 99%  97% 98%  Weight:  49.3 kg    Height:        Intake/Output Summary (Last 24 hours) at 02/03/2021 1740 Last data filed at 02/03/2021 1635 Gross per 24 hour  Intake 3982.25 ml  Output 427 ml  Net 3555.25 ml   Filed Weights   01/31/21 0500 02/01/21 0500 02/03/21 0500  Weight: 53.5 kg 53.6 kg 49.3 kg    Examination: General exam: Alert, awake, oriented x 3, no chest pain, no nausea or vomiting.  No shortness of breath, no abdominal distention.  Patient has had 2 bowel movement.  Chronically ill, weak and deconditioned. Respiratory system: Clear to auscultation. Respiratory effort normal.  No using accessory muscle. Cardiovascular system:RRR. No rubs or gallops.  No JVD. Gastrointestinal system: Abdomen is nondistended, soft and without guarding.   Positive bowel sounds. Central nervous system: No focal neurological deficits. Extremities: No cyanosis or clubbing. Skin: No petechiae. Psychiatry: Judgement and insight appear normal.  Flat affect.   Data Reviewed: I have personally reviewed following labs and imaging studies  CBC: Recent Labs  Lab 02/02/21 0528  WBC 15.1*  NEUTROABS 13.6*  HGB 8.8*  HCT 27.1*  MCV 97.5  PLT 255   Basic Metabolic Panel: Recent Labs  Lab 01/29/21 0443 01/31/21 0545 02/02/21 0528 02/03/21 0329  NA 133* 132* 128* 131*  K 4.3 4.6 4.4 4.6  CL 102 103 99 102  CO2 24 25 25 24   GLUCOSE 230* 202* 212* 211*  BUN 13 14 15  18  CREATININE 0.34* 0.36* 0.38* 0.38*  CALCIUM 8.5* 8.5* 8.3* 8.2*  MG 2.1 2.0 1.9  --   PHOS 3.1 3.5 3.2  --    GFR: Estimated Creatinine Clearance: 42.9 mL/min (A) (by C-G formula based on SCr of 0.38 mg/dL (L)).  Liver Function Tests: Recent Labs  Lab 01/29/21 0443 01/31/21 0545 02/02/21 0528 02/03/21 0329  AST 35 47* 66* 36  ALT 19 29 63* 46*  ALKPHOS 92 133* 188* 152*  BILITOT 0.5 0.5 0.5 0.4  PROT 6.0* 5.8* 6.0* 5.7*  ALBUMIN 1.9* 1.9* 1.9* 1.8*   CBG: Recent Labs  Lab 02/02/21 1720 02/02/21 2102 02/03/21 0112 02/03/21 0542 02/03/21 1122  GLUCAP 190* 115* 185* 214* 167*   Radiology Studies: IR GASTR TUBE CONVERT GASTR-JEJ PER W/FL MOD SED  Result Date: 02/02/2021 INDICATION: 80 year old female referred for conversion of surgical gastrostomy to a gastrojejunostomy Additional history of continuous leakage at the gastrostomy site. The patient had a surgical gastrostomy placed, 24 [place], which was displaced by the patient. A repeat surgery with replacement of a 20 French gastrostomy, balloon retention, was performed. EXAM: CONVERT G-TUBE TO G-JTUBE MEDICATIONS: None ANESTHESIA/SEDATION: Versed 1.0 mg IV; Fentanyl 50 mcg IV Moderate Sedation Time:  45 minutes The patient was continuously monitored during the procedure by the interventional radiology nurse  under my direct supervision. CONTRAST:  20 cc FLUOROSCOPY TIME:  Fluoroscopy Time: 22 minutes 30 seconds (101 mGy). COMPLICATIONS: None PROCEDURE: Informed written consent was obtained from the patient and the patient's family after a thorough discussion of the procedural risks, benefits and alternatives. All questions were addressed. Maximal Sterile Barrier Technique was utilized including caps, mask, sterile gowns, sterile gloves, sterile drape, hand hygiene and skin antiseptic. A timeout was performed prior to the initiation of the procedure. The epigastrium including the indwelling gastrostomy was prepped with Betadine in a sterile fashion, and a sterile drape was applied covering the operative field. A sterile gown and sterile gloves were used for the procedure. Contrast was infused to confirm location within the stomach. The configuration of the contrast in the stomach suggest that this gastrostomy is in the distal stomach, beyond the incisura, and near the pylorus/pyloric channel. A 65 cm Kumpe the catheter was advanced through the gastrostomy. A Glidewire was used to probe for the pylorus, unsuccessful. A Cobra catheter was then used with a Glidewire to probe for the pylorus/duodenum. This was unsuccessful. Kumpe the catheter was then used with a standard Glidewire, with a circuitous route through the cardia of the stomach, looped back to the body/pyloric channel, and ultimately through the duodenum. This redundant route allow the catheter wire combination to go below the balloon on the G-tube. This redundant catheter in wire configuration was under favorable, and ultimately could not be reduced for a more direct route through the pyloric channel. At this point we placed a tandem Bentson wire into the stomach lumen, cut the retention sutures at the skin, and deflated the balloon, partly withdrawing on the gastrostomy. Kumpe the catheter and the Cobra catheter were used to probe for the pylorus, unsuccessful.  Ultimately, a 4 French omni Flush catheter was needed to direct the standard Glidewire through the pyloric channel and into the duodenum. The Omni Flush catheter was then advanced on the Glidewire. With a Glidewire in the duodenum, the 4 [place] omni Flush catheter was withdrawn and the Kumpe the catheter was advanced on the Glidewire to the proximal jejunum. Wire was withdrawn and contrast was infused confirming the location. A 22  French gastrojejunostomy was selected. The final 15 cm was amputated. The tube was then advanced on the Glidewire after withdrawal of the Bentson wire. Approximately 5 cc of mixed contrast and saline were used to inflate the balloon. Contrast was then injected confirming location within the stomach. Patient tolerated the procedure well and remained hemodynamically stable throughout. No complications were encountered and no significant blood loss encountered. IMPRESSION: Exchange of surgical gastrostomy for a new gastrojejunostomy, as above. The site of access for the surgical gastrostomy may not be favorable for Sesma-term effectiveness. Signed, Yvone Neu. Loreta Ave, DO [facility] Radiology Electronically Signed   By: Gilmer Mor D.O.   On: 02/02/2021 15:54     Scheduled Meds:  chlorhexidine  15 mL Mouth Rinse BID   Chlorhexidine Gluconate Cloth  6 each Topical Daily   free water  30 mL Per Tube Q4H   insulin aspart  0-9 Units Subcutaneous Q6H   mouth rinse  15 mL Mouth Rinse q12n4p   metoprolol tartrate  2.5 mg Intravenous Q6H   neomycin-bacitracin-polymyxin  1 application Topical Daily   pantoprazole sodium  40 mg Per Tube BID   scopolamine  1 patch Transdermal Q72H   sodium chloride flush  10-40 mL Intracatheter Q12H   Continuous Infusions:  feeding supplement (OSMOLITE 1.2 CAL) 20 mL/hr at 02/03/21 0416     LOS: 21 days    Time spent: 25 minutes   Vassie Loll, MD [facility]  If 7PM-7AM, please contact  night-coverage [email or web address] 02/03/2021, 5:40 PM

## 2021-02-03 NOTE — TOC Progression Note (Signed)
Transition of Care [facility]) - Progression Note    Patient Details  Name: Kathryn [name] MRN: [id number] Date of Birth: 23-Oct-1940  [facility] CM/SW Contact  Barry Brunner, LCSW Phone Number: 02/03/2021, 4:49 PM  Clinical Narrative:    Patient's daughter now agreeable to SNF Placement. CSW faxed patient to local SNF's and started authorization. TOC to follow.   Expected Discharge Plan: Skilled Nursing Facility Barriers to Discharge: Continued Medical Work up  Expected Discharge Plan and Services Expected Discharge Plan: Skilled Nursing Facility                                               Social Determinants of Health (SDOH) Interventions    Readmission Risk Interventions Readmission Risk Prevention Plan 02/03/2021  Transportation Screening Complete  PCP or Specialist Appt within 3-5 Days Complete  HRI or Home Care Consult Complete  Social Work Consult for Recovery Care Planning/Counseling Complete  Palliative Care Screening Not Applicable  Medication Review [employer]) Complete  Some recent data might be hidden

## 2021-02-03 NOTE — Progress Notes (Signed)
Subjective: Feeling ok overall.  Tube feeds restarted last night.  TPN is also still going.  Per nurse, this is going to be stopped once current bag is complete.  She is tolerating tube feeds.  No nausea or vomiting.  Oral secretions have significantly decreased as she is not eating or drinking by mouth.  Mild intermittent abdominal pain, about the same as it has been for several days now.  She is passing gas.  No BM today.  Cannot member she had a bowel movement yesterday.  Per nursing staff, last BM was at 5 AM this morning.  No overt GI bleeding.  Objective: Vital signs in last 24 hours: Temp:  [98.1 F (36.7 C)-98.8 F (37.1 C)] 98.3 F (36.8 C) (08/09 0554) Pulse Rate:  [93-109] 93 (08/09 0554) Resp:  [15-20] 18 (08/09 0554) BP: (96-142)/(57-74) 112/57 (08/09 0554) SpO2:  [88 %-100 %] 97 % (08/09 0554) Weight:  [49.3 kg] 49.3 kg (08/09 0500) Last BM Date: 02/02/21 General:   Alert and oriented, pleasant, frail, thin Head:  Normocephalic and atraumatic. Eyes:  No icterus, sclera clear. Conjuctiva pink.  Mouth: Dry mucous membranes..  Abdomen:  Bowel sounds present, soft, non-distended, mild generalized TTP.  No HSM or hernias noted. No rebound or guarding. PEG/J site clean and dry with dressing in place.  Msk:  Symmetrical without gross deformities. Normal posture. Extremities:  Without edema. Neurologic:  Alert and  oriented x4;  grossly normal neurologically. Skin:  Warm and dry, intact without significant lesions.  Psych:  Normal mood and affect.  Intake/Output from previous day: 08/08 0701 - 08/09 0700 In: 2874.9 [I.V.:2001.6; NG/GT:150.3; IV Piggyback:723] Out: 6 [Urine:1; Drains:3; Stool:2] Intake/Output this shift: Total I/O In: 810 [NG/GT:810] Out: 401 [Urine:401]  Lab Results: Recent Labs    02/02/21 0528  WBC 15.1*  HGB 8.8*  HCT 27.1*  PLT 255   BMET Recent Labs    02/02/21 0528 02/03/21 0329  NA 128* 131*  K 4.4 4.6  CL 99 102  CO2 25 24   GLUCOSE 212* 211*  BUN 15 18  CREATININE 0.38* 0.38*  CALCIUM 8.3* 8.2*   LFT Recent Labs    02/02/21 0528 02/03/21 0329  PROT 6.0* 5.7*  ALBUMIN 1.9* 1.8*  AST 66* 36  ALT 63* 46*  ALKPHOS 188* 152*  BILITOT 0.5 0.4    Studies/Results: IR GASTR TUBE CONVERT GASTR-JEJ PER W/FL MOD SED  Result Date: 02/02/2021 INDICATION: 80 year old female referred for conversion of surgical gastrostomy to a gastrojejunostomy Additional history of continuous leakage at the gastrostomy site. The patient had a surgical gastrostomy placed, 24 [place], which was displaced by the patient. A repeat surgery with replacement of a 20 French gastrostomy, balloon retention, was performed. EXAM: CONVERT G-TUBE TO G-JTUBE MEDICATIONS: None ANESTHESIA/SEDATION: Versed 1.0 mg IV; Fentanyl 50 mcg IV Moderate Sedation Time:  45 minutes The patient was continuously monitored during the procedure by the interventional radiology nurse under my direct supervision. CONTRAST:  20 cc FLUOROSCOPY TIME:  Fluoroscopy Time: 22 minutes 30 seconds (101 mGy). COMPLICATIONS: None PROCEDURE: Informed written consent was obtained from the patient and the patient's family after a thorough discussion of the procedural risks, benefits and alternatives. All questions were addressed. Maximal Sterile Barrier Technique was utilized including caps, mask, sterile gowns, sterile gloves, sterile drape, hand hygiene and skin antiseptic. A timeout was performed prior to the initiation of the procedure. The epigastrium including the indwelling gastrostomy was prepped with Betadine in a sterile fashion, and  a sterile drape was applied covering the operative field. A sterile gown and sterile gloves were used for the procedure. Contrast was infused to confirm location within the stomach. The configuration of the contrast in the stomach suggest that this gastrostomy is in the distal stomach, beyond the incisura, and near the pylorus/pyloric channel. A 65 cm Kumpe  the catheter was advanced through the gastrostomy. A Glidewire was used to probe for the pylorus, unsuccessful. A Cobra catheter was then used with a Glidewire to probe for the pylorus/duodenum. This was unsuccessful. Kumpe the catheter was then used with a standard Glidewire, with a circuitous route through the cardia of the stomach, looped back to the body/pyloric channel, and ultimately through the duodenum. This redundant route allow the catheter wire combination to go below the balloon on the G-tube. This redundant catheter in wire configuration was under favorable, and ultimately could not be reduced for a more direct route through the pyloric channel. At this point we placed a tandem Bentson wire into the stomach lumen, cut the retention sutures at the skin, and deflated the balloon, partly withdrawing on the gastrostomy. Kumpe the catheter and the Cobra catheter were used to probe for the pylorus, unsuccessful. Ultimately, a 4 French omni Flush catheter was needed to direct the standard Glidewire through the pyloric channel and into the duodenum. The Omni Flush catheter was then advanced on the Glidewire. With a Glidewire in the duodenum, the 4 [place] omni Flush catheter was withdrawn and the Kumpe the catheter was advanced on the Glidewire to the proximal jejunum. Wire was withdrawn and contrast was infused confirming the location. A 22 French gastrojejunostomy was selected. The final 15 cm was amputated. The tube was then advanced on the Glidewire after withdrawal of the Bentson wire. Approximately 5 cc of mixed contrast and saline were used to inflate the balloon. Contrast was then injected confirming location within the stomach. Patient tolerated the procedure well and remained hemodynamically stable throughout. No complications were encountered and no significant blood loss encountered. IMPRESSION: Exchange of surgical gastrostomy for a new gastrojejunostomy, as above. The site of access for the  surgical gastrostomy may not be favorable for Ben-term effectiveness. Signed, Yvone Neu. Loreta Ave, DO [facility] Electronically Signed   By: Gilmer Mor D.O.   On: 02/02/2021 15:54    Assessment: 80 y.o. female with medical history significant for  hypertension, type 2 diabetes mellitus, GERD, positive ANA and SSA autoantibodies, achalasia diagnosed in 2014 s/p robotic Heller myotomy at [facility] who presented to the emergency room on 7/19 after worsening regurgitation, increased oral secretions, dysphagia, poor oral intake with episodes of vomiting over the last 2 months with associated significant weight loss.  EGD 7/21 with Dr. Karilyn Cota with hypertonic LES, large amount of fluid in the stomach (suctioned), and Botox injection was performed.  Unfortunately, patient had no  improvement with Botox therapy. Due to concern for gastroparesis versus more diffuse motility issue possibly secondary to undiagnosed rheumatologic condition, patient had open G-tube placement on 7/28 with plans for conversion to GJ tube in 1 week if needed.  She also had partial small bowel resection at the time of open G-tube placement due to adhesion of small bowel to the midline with side-to-side anastomosis performed. G tube was displaced and reinserted on 7/29.  She is started on trickle tube feeds, but experienced an episode of aspiration on 7/31 and developed aspiration pneumonia, now s/p course of Unasyn.  Tube feeds were stopped  and she was started on TPN.  Patient saw IR yesterday NG tube was converted to GJ tube.  She was restarted on tube feeds at 20 mL/h last night and is tolerating this well.  Denies nausea, vomiting, bloating or abdominal distention.  Last BM was this morning at 5 AM per nursing staff.   There had been some discussion about utilizing motility agents such as Motegrity to assist with suspected generalized GI dysmotility.  As she is currently  tolerating GI tube feeds well and bowels are continuing to move, we will hold off on starting this medication for now and continue to monitor.   For definitive management of achalasia, she will need to be referred back to [facility] where she had her initial intervention for consideration of repeat Heller myotomy versus POEM.  Plan: Continue Tube feeds. Management per hospitalist/nutritionist.  Pharmacy plans to discontinue TPN after current bag is complete. Continue to monitor tolerance of tube feeds.  If she develops nausea, vomiting, decreased BMs, abdominal distention, could consider motility agent.  Patient will need referral back to [facility] for consideration of Heller myotomy versus p.o. and for definitive management of achalasia.   LOS: 21 days    02/03/2021, 12:53 PM   Ermalinda Memos, [facility] Gastroenterology

## 2021-02-03 NOTE — [place] FL2 (Signed)
[place] MEDICAID FL2 LEVEL OF CARE SCREENING TOOL     IDENTIFICATION  Patient Name: Kathryn [name] Birthdate: 08/07/1940 Sex: female Admission Date (Current Location): 01/13/2021  [facility] and [place] Number:  [employer] and Address:  [facility],  [address] [place] [address]      Provider Number: [id number]  Attending Physician Name and Address:  Vassie Loll, MD  Relative Name and Phone Number:  Pickett,Winnifred Daughter [phone]    Current Level of Care: SNF Recommended Level of Care: Skilled Nursing Facility Prior Approval Number:    Date Approved/Denied: 02/03/21 PASRR Number: [id number]  Discharge Plan: SNF    Current Diagnoses: Patient Active Problem List   Diagnosis Date Noted   Pressure injury of skin 01/28/2021   Constipation    Dislodged gastrostomy tube    Hypoactive bowel sounds    Abdominal pain 01/14/2021   Generalized weakness 01/14/2021   Dehydration 01/14/2021   Normocytic anemia 01/14/2021   Leukocytosis 01/14/2021   Hyperglycemia 01/14/2021   Hypoalbuminemia due to protein-calorie malnutrition (HCC) 01/14/2021   Prolonged QT interval 01/14/2021   Malnutrition of moderate degree 01/14/2021   Achalasia    Nausea and vomiting 01/13/2021   Colitis 12/25/2020   COVID-19 virus infection 12/25/2020   Loss of weight    Dysphagia    Early satiety    UTI (urinary tract infection) 10/23/2020   Elevated d-dimer 10/23/2020   Tachycardia 10/23/2020   Abnormal computed tomography of esophagus 10/23/2020   Mild protein-calorie malnutrition (HCC) 10/23/2020   Rhabdomyolysis 10/23/2020   HTN (hypertension) 10/23/2020   H/O rotator cuff syndrome 07/12/2012   Rotator cuff tendinitis 07/12/2012   SHOULDER PAIN 10/30/2007    Orientation RESPIRATION BLADDER Height & Weight     Self  Normal Incontinent Weight: 108 lb 11 oz (49.3 kg) Height:  5\' 4"  (162.6 cm)  BEHAVIORAL SYMPTOMS/MOOD NEUROLOGICAL BOWEL NUTRITION  STATUS      Incontinent Diet  AMBULATORY STATUS COMMUNICATION OF NEEDS Skin   Extensive Assist Verbally Other (Comment) (Pressure injury Stage 2 buttocks)                       Personal Care Assistance Level of Assistance  Bathing, Feeding, Dressing Bathing Assistance: Maximum assistance Feeding assistance: Maximum assistance Dressing Assistance: Maximum assistance     Functional Limitations Info  Sight, Hearing, Speech Sight Info: Adequate Hearing Info: Adequate Speech Info: Adequate    SPECIAL CARE FACTORS FREQUENCY  PT (By licensed PT)     PT Frequency: 5x              Contractures Contractures Info: Not present    Additional Factors Info  Code Status, Allergies Code Status Info: DNR Allergies Info: Cyclobenzaprine Hcl           Current Medications (02/03/2021):  This is the current hospital active medication list Current Facility-Administered Medications  Medication Dose Route Frequency Provider Last Rate Last Admin   acetaminophen (TYLENOL) tablet 650 mg  650 mg Per Tube Q6H PRN 04/05/2021, MD       chlorhexidine (PERIDEX) 0.12 % solution 15 mL  15 mL Mouth Rinse BID Vassie Loll, MD   15 mL at 02/03/21 1004   Chlorhexidine Gluconate Cloth 2 % PADS 6 each  6 each Topical Daily 04/05/21, MD   6 each at 02/02/21 1808   feeding supplement (OSMOLITE 1.2 CAL) liquid 1,000 mL  1,000 mL Per Tube Continuous 04/04/21, MD 20 mL/hr  at 02/03/21 0416 Infusion Verify at 02/03/21 0416   free water 30 mL  30 mL Per Tube Q4H Vassie Loll, MD   30 mL at 02/03/21 1029   glycopyrrolate (ROBINUL) injection 0.1 mg  0.1 mg Intravenous Q6H PRN Vassie Loll, MD   0.1 mg at 01/30/21 1607   insulin aspart (novoLOG) injection 0-9 Units  0-9 Units Subcutaneous Q6H Vassie Loll, MD   3 Units at 02/03/21 0545   iohexol (OMNIPAQUE) 300 MG/ML solution 50 mL  50 mL Per Tube Once PRN Gilmer Mor, DO       MEDLINE mouth rinse  15 mL Mouth Rinse q12n4p Vassie Loll,  MD   15 mL at 01/31/21 1734   metoprolol tartrate (LOPRESSOR) injection 2.5 mg  2.5 mg Intravenous Q6H Vassie Loll, MD   2.5 mg at 02/03/21 0526   metoprolol tartrate (LOPRESSOR) injection 5 mg  5 mg Intravenous Q6H PRN Vassie Loll, MD   5 mg at 01/24/21 7342   morphine 2 MG/ML injection 0.5 mg  0.5 mg Intravenous Q4H PRN Vassie Loll, MD       neomycin-bacitracin-polymyxin (NEOSPORIN) ointment packet 1 application  1 application Topical Daily Gilmer Mor, DO   1 application at 02/03/21 1004   oxyCODONE (ROXICODONE INTENSOL) 20 MG/ML concentrated solution 5 mg  5 mg Per Tube Q4H PRN Vassie Loll, MD   5 mg at 02/02/21 0444   pantoprazole sodium (PROTONIX) 40 mg/20 mL oral suspension 40 mg  40 mg Per Tube BID Vassie Loll, MD   40 mg at 02/03/21 1005   prochlorperazine (COMPAZINE) injection 10 mg  10 mg Intravenous Q6H PRN Vassie Loll, MD       scopolamine (TRANSDERM-SCOP) 1 MG/3DAYS 1.5 mg  1 patch Transdermal Q72H Vassie Loll, MD   1.5 mg at 02/01/21 2018   sodium chloride flush (NS) 0.9 % injection 10-40 mL  10-40 mL Intracatheter Q12H Vassie Loll, MD   10 mL at 02/03/21 1005   sodium chloride flush (NS) 0.9 % injection 10-40 mL  10-40 mL Intracatheter PRN Vassie Loll, MD   10 mL at 02/02/21 2215     Discharge Medications: Please see discharge summary for a list of discharge medications.  Relevant Imaging Results:  Relevant Lab Results:   Additional Information Pt SSN [id number]  Barry Brunner, LCSW

## 2021-02-04 LAB — GLUCOSE, CAPILLARY
Glucose-Capillary: 126 mg/dL — ABNORMAL HIGH (ref 70–99)
Glucose-Capillary: 144 mg/dL — ABNORMAL HIGH (ref 70–99)
Glucose-Capillary: 124 mg/dL — ABNORMAL HIGH (ref 70–99)
Glucose-Capillary: 98 mg/dL (ref 70–99)
Glucose-Capillary: 129 mg/dL — ABNORMAL HIGH (ref 70–99)

## 2021-02-04 MED ORDER — FREE WATER
200.0000 mL | Freq: Four times a day (QID) | Status: DC
  Administered 2021-02-04 – 2021-02-05 (×4): 200 mL

## 2021-02-04 MED ORDER — METOPROLOL TARTRATE 25 MG PO TABS
12.5000 mg | ORAL_TABLET | Freq: Two times a day (BID) | ORAL | Status: DC
  Administered 2021-02-04 – 2021-02-05 (×3): 12.5 mg via ORAL
  Filled 2021-02-04 (×4): qty 1

## 2021-02-04 MED ORDER — OSMOLITE 1.2 CAL PO LIQD
1000.0000 mL | ORAL | Status: DC
  Administered 2021-02-04 – 2021-02-05 (×3): 1000 mL

## 2021-02-04 NOTE — Progress Notes (Signed)
PROGRESS NOTE    GIADA SCHOPPE  VEL:[id number] DOB: Sep 13, 1940 DOA: 01/13/2021 PCP: Suzan Slick, MD   Brief Narrative:   Kathryn [name] is a 80 y.o. female with medical history significant for  hypertension, type 2 diabetes mellitus, GERD, positive ANA and SSA autoantibodies, and chronic achalasia (2014 manometry showed abnormal motility)  who presents to the emergency department due to more than 2 months history of decreased oral intake, generalized weakness and about 3 to 4-week onset of abdominal pain associated with difficulty in being able to swallow (solid and liquid).  She believes that she must have lost up to 30 pounds since onset of symptoms.  Patient endorsed that she usually regurgitates any food/liquid ingested within 15 to 20 minutes of ingestion.  Abdominal pain was in the epigastric area and was sharp in nature, this worsens with food.  She denies fever, chills, chest pain, shortness of breath.   She was admitted to the hospital from 12/25/2020-12/28/2020, at that time barium p.o. esophagram done urgently as an outpatient was indicated.  She was also admitted from 10/23/2020 to 10/25/2020 during which patient presented with proximal muscle weakness with an elevated CPK of 1206 that was concerning for polymyalgia rheumatica. This was treated with prednisone with improvement.  EGD done on 10/04/2020 showed normal dilated esophagus and small hiatal hernia.   In the emergency department, she was intermittently tachypneic and tachycardic.  Other vital signs were within normal range.  Work-up in the ED showed normocytic anemia and leukocytosis, BMP was normal except for elevated BUN at and hyperglycemia with CBG of 135.  Albumin 3.2. CT of head without contrast showed no acute intracranial abnormality. IV hydration was provided, hospitalist was asked to admit patient for further evaluation and management.  -Patient continued to experience significant epigastric pain and underwent EGD with  Botox treatment on 7/21.  She continued to have difficulty swallowing and plans were being made for PEG placement which patient was agreeable to.  She underwent open gastrostomy tube placement on 7/28 and then had dislodgment on 7/29 which required subsequent placement on that date.  She started on trickle feeds on 7/30 and feeds were being advanced, but unfortunately on 8/1 she had recurrent aspiration and is now on Unasyn for coverage.  Tube feeds had to be discontinued and status post GJ tube conversion by IR; maximizing tube feedings and discontinue TPN.  Patient will need a skilled nursing facility at discharge for rehabilitation and further care.  Assessment & Plan:   Principal Problem:   Abdominal pain Active Problems:   HTN (hypertension)   Dysphagia   Nausea and vomiting   Generalized weakness   Dehydration   Normocytic anemia   Leukocytosis   Hyperglycemia   Hypoalbuminemia due to protein-calorie malnutrition (HCC)   Prolonged QT interval   Achalasia   Malnutrition of moderate degree   Hypoactive bowel sounds   Dislodged gastrostomy tube   Constipation   Pressure injury of skin   Epigastric/Abdominal pain, nausea and vomiting in the setting of esophageal dysphagia Patient is ANA/SSA antibody positive. Sjogren's syndrome and SLE are on the differential. Patient also has had increased oral secretion, suggestive of sicca-related symptoms. Gastrointestinal involvement of SLE can manifest as esophageal motility disorder, such as achalasia, which has been suggesting in this patient. Other GI manifestations of SLE include acute pancreatitis, for which  patient signs and symptoms are concerning, but CT 6/30 has shown no acute pancreatic findings. EGD in April 2022 showed unremarkable esophagus, and  patient underwent dialtion given history of dysphagia, but patient states that did not help with symptoms. --Continue Protonix 40 mg po BID -Continue clear liquid diet with plan to advanced  diet as tolerated -Gastroenterology following for further recommendations -ongoing goals of care discussions with palliative team, patient and daughter regarding tube feeding -Patient has undergone G-tube placement on 7/28 with dislodgment noted on 7/29 which has undergone replacement - PICC line placed 8/1 and TPN initiated, TPN has since been discontinued -s/p  GJ tube conversion by IR  on 02/02/21  -\Continue Osmolite 1.2 @ 30 ml/hr via PEJ. Continue to advance 10 ml every 4 hours to goal rate of 55 ml/hr. -currently at 30 mL an hour C/n  free water flushes- 200 ml qid per tube  Tube feeding regimen provides 1584 kcal, 73.2 grams of protein, and 1082 ml of H2O. .    Aspiration pneumonia -Patient has completed treatment with Unasyn. -No requiring oxygen supplementation no using accessory muscles. -   Generalized weakness and dehydration in the setting of above -Continue protein supplement IR converted her G-tube to GJ tube as above   Urinary tract infection - TREATED - UA in the ED showed many bacteria, moderate ketones, nitrites -Patient has completed IV antibiotic therapy on 01/17/2021.  Normocytic anemia -Chronic and Stable   COVID-19 infection (incidental finding) -Patient is asymptomatic and stable on room air -This was already diagnosed during last admission and no treatment has been required; patient is out of isolation at this time.   Essential hypertension -Metoprolol 12.5 mg twice daily  Hyperglycemia secondary to T2DM -Hemoglobin A1c on 6/30-5.8% -Continue sliding scale insulin for elevated CBGs in the setting of TPN and tube feedings.   GERD -Continue Protonix  DNR -Continue to treat what is treatable -No intubation, no mechanical ventilatory support and no resuscitation. -Wishes and goals of care discussion will be respected. -Daughter Winnifred who goes by [name] [phone]  Helps patient make decisions  Physical deconditioning -Patient has been seen  by physical therapy with recommendation to skilled nursing facility placement at discharge for rehabilitation and care. -Discussed with daughter at bedside, in agreement with rehabilitation placement. -TOC has been made aware.     DVT prophylaxis:SCD Code Status: DNR Family Communication: D/w Daughter Winnifred who goes by [place] at [phone]  Disposition Plan:  Status is: Inpatient   Remains inpatient appropriate because:Ongoing diagnostic testing needed not appropriate for outpatient work up and IV treatments appropriate due to intensity of illness or inability to take PO   Dispo: The patient is from: Home              Anticipated d/c is to: SNF              Patient currently is not medically stable to d/c.              Difficult to place patient No   Consultants:  Gastroenterology Palliative care Surgery (regarding PEG placement) IR   Procedures:  Barium swallow 7/20 EGD with botox injection 7/21 Open gastrostomy tube placement with lysis of adhesions and small bowel resection 7/28 Repeat placement of open gastrostomy tube on 7/29 PICC line for TPN 8/1 GJ tube conversion on 02/02/2021 by IR  Antimicrobials:  Anti-infectives (From admission, onward)    Start     Dose/Rate Route Frequency Ordered Stop   01/26/21 0100  Ampicillin-Sulbactam (UNASYN) 3 g in sodium chloride 0.9 % 100 mL IVPB        3 g 200 mL/hr over  30 Minutes Intravenous Every 6 hours 01/26/21 0009 02/03/21 0259   01/26/21 0100  doxycycline (VIBRAMYCIN) 100 mg in sodium chloride 0.9 % 250 mL IVPB  Status:  Discontinued        100 mg 125 mL/hr over 120 Minutes Intravenous Every 12 hours 01/26/21 0009 01/26/21 0745   01/23/21 1300  cefoTEtan (CEFOTAN) 2 g in sodium chloride 0.9 % 100 mL IVPB        2 g 200 mL/hr over 30 Minutes Intravenous On call to O.R. 01/23/21 0959 01/23/21 1952   01/23/21 1157  sodium chloride 0.9 % with cefoTEtan (CEFOTAN) ADS Med       Note to Pharmacy: Devoria GlassingMoore, Kimberly   : cabinet  override      01/23/21 1157 01/23/21 1411   01/22/21 0600  ceFAZolin (ANCEF) IVPB 2g/100 mL premix        2 g 200 mL/hr over 30 Minutes Intravenous On call to O.R. 01/21/21 1747 01/22/21 0900   01/14/21 0830  cefTRIAXone (ROCEPHIN) 1 g in sodium chloride 0.9 % 100 mL IVPB        1 g 200 mL/hr over 30 Minutes Intravenous Every 24 hours 01/14/21 0735 01/16/21 1023   01/14/21 0800  fosfomycin (MONUROL) packet 3 g  Status:  Discontinued        3 g Oral  Once 01/14/21 0711 01/14/21 0734       Subjective:  -Tolerating tube feeding and water flushes okay at this time -Continues to have increased oral secretions.  Objective: Vitals:   02/04/21 0049 02/04/21 0349 02/04/21 0530 02/04/21 1204  BP: (!) 112/52 109/62 (!) 108/55 (!) 108/57  Pulse: (!) 108 96 100 95  Resp:  19 18 16   Temp:  98.2 F (36.8 C) 98.2 F (36.8 C) 98.8 F (37.1 C)  TempSrc:   Oral Oral  SpO2:  100% 100% 100%  Weight:   52.7 kg   Height:        Intake/Output Summary (Last 24 hours) at 02/04/2021 1703 Last data filed at 02/04/2021 1355 Gross per 24 hour  Intake --  Output 415 ml  Net -415 ml   Filed Weights   02/01/21 0500 02/03/21 0500 02/04/21 0530  Weight: 53.6 kg 49.3 kg 52.7 kg    Examination:  Physical Exam  Gen:- Awake Alert, chronically ill-appearing HEENT:- [place].AT, No sclera icterus Neck-Supple Neck,No JVD,.  Lungs-  CTAB , fair air movement CV- S1, S2 normal, RRR Abd-  +ve B.Sounds, Abd Soft, No tenderness, GJ tube site clean dry intact    Extremity/Skin:- No  edema,   good pedal pulses Psych-affect is flat, oriented x3 Neuro-generalized weakness, no new focal deficits, no tremors   Data Reviewed: I have personally reviewed following labs and imaging studies  CBC: Recent Labs  Lab 02/02/21 0528  WBC 15.1*  NEUTROABS 13.6*  HGB 8.8*  HCT 27.1*  MCV 97.5  PLT 255   Basic Metabolic Panel: Recent Labs  Lab 01/29/21 0443 01/31/21 0545 02/02/21 0528 02/03/21 0329  NA 133*  132* 128* 131*  K 4.3 4.6 4.4 4.6  CL 102 103 99 102  CO2 24 25 25 24   GLUCOSE 230* 202* 212* 211*  BUN 13 14 15 18   CREATININE 0.34* 0.36* 0.38* 0.38*  CALCIUM 8.5* 8.5* 8.3* 8.2*  MG 2.1 2.0 1.9  --   PHOS 3.1 3.5 3.2  --    GFR: Estimated Creatinine Clearance: 45.9 mL/min (A) (by C-G formula based on SCr of 0.38 mg/dL (L)).  Liver Function Tests: Recent Labs  Lab 01/29/21 0443 01/31/21 0545 02/02/21 0528 02/03/21 0329  AST 35 47* 66* 36  ALT 19 29 63* 46*  ALKPHOS 92 133* 188* 152*  BILITOT 0.5 0.5 0.5 0.4  PROT 6.0* 5.8* 6.0* 5.7*  ALBUMIN 1.9* 1.9* 1.9* 1.8*   CBG: Recent Labs  Lab 02/03/21 1818 02/04/21 0059 02/04/21 0534 02/04/21 1133 02/04/21 1632  GLUCAP 172* 126* 144* 124* 98   Radiology Studies: No results found.   Scheduled Meds:  chlorhexidine  15 mL Mouth Rinse BID   Chlorhexidine Gluconate Cloth  6 each Topical Daily   free water  30 mL Per Tube Q4H   insulin aspart  0-9 Units Subcutaneous Q6H   mouth rinse  15 mL Mouth Rinse q12n4p   metoprolol tartrate  2.5 mg Intravenous Q6H   neomycin-bacitracin-polymyxin  1 application Topical Daily   pantoprazole sodium  40 mg Per Tube BID   scopolamine  1 patch Transdermal Q72H   sodium chloride flush  10-40 mL Intracatheter Q12H   Continuous Infusions:  feeding supplement (OSMOLITE 1.2 CAL)       LOS: 22 days    Shon Hale, MD [facility]  If 7PM-7AM, please contact night-coverage [email or web address] 02/04/2021, 5:03 PM

## 2021-02-04 NOTE — Progress Notes (Signed)
Nutrition Follow-up  DOCUMENTATION CODES:   Non-severe (moderate) malnutrition in context of chronic illness  INTERVENTION:  Osmolite 1.2 @ 30 ml/hr via PEJ. Continue to advance 10 ml every 4 hours to goal rate of 55 ml/hr.  Patient will need free water flushes- 250 ml BID per tube if not receiving IVF   Tube feeding regimen provides 1584 kcal, 73.2 grams of protein, and 1082 ml of H2O.   Recommend daily weights with discharge to next venue     NUTRITION DIAGNOSIS:   Moderate Malnutrition related to chronic illness (achalasia) as evidenced by energy intake < or equal to 75% for > or equal to 1 month, mild fat depletion, mild muscle depletion. - receiving TPN and plans to resume enteral nutrition once PEG-J is placed  GOAL:  Patient will meet greater than or equal to 90% of their needs -progressing   MONITOR:  Diet advancement, Supplement acceptance, PO intake, Weight trends   ASSESSMENT: patient is a 80 yo female with history of DM-2, HTN, GERD and chronic achalasia. Patient presents with epigastric pain, weakness and dehydration.   Positive ANA and SSA antibody (to follow up with Rheumatology).  Weight loss reported per chart review. Patient weight 10/24/20 -56.7 kg (124.7 lb) and currently 54.4 kg (119.6 lb).    8/5 patient receiving TPN and was to have conversion of G- a GJ tube today IR. Procedure has been rescheduled for Monday 8/8. TPN @ 80 ml/hr meeting 100% estimated nutrition needs. [6283 kcal, 76.8 gr protein, 38.4 gr lipids]. Current weight 53.7 kg.  8/8 Patient has returned from procedure IR- Conversion of surgical gastrostomy to a gastro-jejunostomy. Per MD- OK to use. Weights reviewed. Patient weight gain of 3.9 kg since admission. Expect related to fluid status change. Re-estimated nutrition needs.  8/9 Patient tube feeding started and tolerating at 20 ml/hr. Weight updated and reviewed. Patient planned disposition is to SNF possibly tomorrow. Hyponatremia noted.    8/10 talked with nursing. Patient tube feeds at 30 ml/hr -patient has no complaints. Will continue to advance to goal rate. TPN discontinued. CBG's-200's.  Medications reviewed and include:  protonix.  Intake/Output Summary (Last 24 hours) at 02/04/2021 1339 Last data filed at 02/04/2021 1230 Gross per 24 hour  Intake 297.33 ml  Output 20 ml  Net 277.33 ml    Labs: BMP Latest Ref Rng & Units 02/03/2021 02/02/2021 01/31/2021  Glucose 70 - 99 mg/dL 662(H) 476(L) 465(K)  BUN 8 - 23 mg/dL 18 15 14   Creatinine 0.44 - 1.00 mg/dL ) 3.54(S) 5.68(L)  Sodium 135 - 145 mmol/L 131(L) 128(L) 132(L)  Potassium 3.5 - 5.1 mmol/L 4.6 4.4 4.6  Chloride 98 - 111 mmol/L 102 99 103  CO2 22 - 32 mmol/L 24 25 25   Calcium 8.9 - 10.3 mg/dL 8.2(L) 8.3(L) 8.5(L)      NUTRITION - FOCUSED PHYSICAL EXAM: Nutrition-Focused physical exam completed. Findings are mild upper arm fat depletion, moderate clavicle and mild temporal muscle depletion, and no edema.     Diet Order:   Diet Order             Diet NPO time specified Except for: Sips with Meds  Diet effective midnight                   EDUCATION NEEDS:  Not appropriate for education at this time  Skin:  Skin Assessment: Reviewed RN Assessment  Last BM:  8/10 type 7   Height:   Ht Readings from Last 1 Encounters:  01/23/21 5\' 4"  (1.626 m)    Weight:   Wt Readings from Last 1 Encounters:  02/04/21 52.7 kg  Admission wt 49.7 kg  Ideal Body Weight:   52 kg  BMI:  Body mass index is 19.94 kg/m.  Estimated Nutritional Needs:   Kcal:  1500-1700  Protein:  70-75 gr  Fluid:  1500 ml daily  04/06/21 MS,RD,CSG,LDN Contact: Royann Shivers

## 2021-02-04 NOTE — Progress Notes (Signed)
Patient sleeping. States she is feeling better today. Denies abdominal pain, nausea or vomiting. GJ was placed a few days ago and patient continues receiving tube feeds. She states she is doing well with this morning and has no complaints.

## 2021-02-04 NOTE — Plan of Care (Signed)
  Problem: Education: Goal: Knowledge of General Education information will improve Description Including pain rating scale, medication(s)/side effects and non-pharmacologic comfort measures Outcome: Progressing   Problem: Health Behavior/Discharge Planning: Goal: Ability to manage health-related needs will improve Outcome: Progressing   

## 2021-02-04 NOTE — TOC Progression Note (Signed)
Transition of Care [facility]) - Progression Note    Patient Details  Name: VELEKA DJORDJEVIC MRN: [id number] Date of Birth: [birth year]  Transition of Care [facility]) CM/SW Contact  Annice Needy, LCSW Phone Number: 02/04/2021, 2:21 PM  Clinical Narrative:    Spoke with daughter, Winnifred "[name]", and provided bed offers. [name] would like for patient to come home if care could be arranged. Discussed the limitations of HH services and private duty as an option. [name] stated that she would contact TOC in return to discuss potential facilities of choice for discharge.    Expected Discharge Plan: Skilled Nursing Facility Barriers to Discharge: Continued Medical Work up  Expected Discharge Plan and Services Expected Discharge Plan: Skilled Nursing Facility                                               Social Determinants of Health (SDOH) Interventions    Readmission Risk Interventions Readmission Risk Prevention Plan 02/03/2021  Transportation Screening Complete  PCP or Specialist Appt within 3-5 Days Complete  HRI or Home Care Consult Complete  Social Work Consult for Recovery Care Planning/Counseling Complete  Palliative Care Screening Not Applicable  Medication Review Oceanographer) Complete  Some recent data might be hidden

## 2021-02-05 LAB — GLUCOSE, CAPILLARY
Glucose-Capillary: 214 mg/dL — ABNORMAL HIGH (ref 70–99)
Glucose-Capillary: 169 mg/dL — ABNORMAL HIGH (ref 70–99)
Glucose-Capillary: 119 mg/dL — ABNORMAL HIGH (ref 70–99)
Glucose-Capillary: 162 mg/dL — ABNORMAL HIGH (ref 70–99)

## 2021-02-05 LAB — BASIC METABOLIC PANEL
Anion gap: 4 — ABNORMAL LOW (ref 5–15)
BUN: 10 mg/dL (ref 8–23)
CO2: 25 mmol/L (ref 22–32)
Calcium: 8.1 mg/dL — ABNORMAL LOW (ref 8.9–10.3)
Chloride: 101 mmol/L (ref 98–111)
Creatinine, Ser: 0.41 mg/dL — ABNORMAL LOW (ref 0.44–1.00)
GFR, Estimated: >60 mL/min (ref >60)
Glucose, Bld: 207 mg/dL — ABNORMAL HIGH (ref 70–99)
Potassium: 4.6 mmol/L (ref 3.5–5.1)
Sodium: 130 mmol/L — ABNORMAL LOW (ref 135–145)

## 2021-02-05 LAB — CBC
HCT: 25 % — ABNORMAL LOW (ref 36.0–46.0)
Hemoglobin: 7.9 g/dL — ABNORMAL LOW (ref 12.0–15.0)
MCH: 31 pg (ref 26.0–34.0)
MCHC: 31.6 g/dL (ref 30.0–36.0)
MCV: 98 fL (ref 80.0–100.0)
Platelets: 306 10*3/uL (ref 150–400)
RBC: 2.55 MIL/uL — ABNORMAL LOW (ref 3.87–5.11)
RDW: 14.6 % (ref 11.5–15.5)
WBC: 9.4 10*3/uL (ref 4.0–10.5)
nRBC: 0 % (ref 0.0–0.2)

## 2021-02-05 LAB — RESP PANEL BY RT-PCR (FLU A&B, COVID) ARPGX2
Influenza A by PCR: NEGATIVE
Influenza B by PCR: NEGATIVE
SARS Coronavirus 2 by RT PCR: NEGATIVE

## 2021-02-05 MED ORDER — FREE WATER
150.0000 mL | Freq: Four times a day (QID) | Status: DC
  Administered 2021-02-05 – 2021-02-06 (×3): 150 mL

## 2021-02-05 MED ORDER — OXYCODONE HCL 20 MG/ML PO CONC: 5.0000 mg | Freq: Two times a day (BID) | ORAL | Status: DC | PRN

## 2021-02-05 NOTE — NC FL2 (Signed)
[place] MEDICAID FL2 LEVEL OF CARE SCREENING TOOL     IDENTIFICATION  Patient Name: Kathryn [name] Birthdate: 01/11/1941 Sex: female Admission Date (Current Location): 01/13/2021  [facility] and [place] Number:  [employer] and Address:  [facility],  [address] [place] [address]      Provider Number: [id number]  Attending Physician Name and Address:  Shon Hale, MD  Relative Name and Phone Number:  Pickett,Winnifred Daughter [phone]    Current Level of Care: SNF Recommended Level of Care: Skilled Nursing Facility Prior Approval Number:    Date Approved/Denied: 02/03/21 PASRR Number: [id number]  Discharge Plan: SNF    Current Diagnoses: Patient Active Problem List   Diagnosis Date Noted   Pressure injury of skin 01/28/2021   Constipation    Dislodged gastrostomy tube    Hypoactive bowel sounds    Abdominal pain 01/14/2021   Generalized weakness 01/14/2021   Dehydration 01/14/2021   Normocytic anemia 01/14/2021   Leukocytosis 01/14/2021   Hyperglycemia 01/14/2021   Hypoalbuminemia due to protein-calorie malnutrition (HCC) 01/14/2021   Prolonged QT interval 01/14/2021   Malnutrition of moderate degree 01/14/2021   Achalasia    Nausea and vomiting 01/13/2021   Colitis 12/25/2020   COVID-19 virus infection 12/25/2020   Loss of weight    Dysphagia    Early satiety    UTI (urinary tract infection) 10/23/2020   Elevated d-dimer 10/23/2020   Tachycardia 10/23/2020   Abnormal computed tomography of esophagus 10/23/2020   Mild protein-calorie malnutrition (HCC) 10/23/2020   Rhabdomyolysis 10/23/2020   HTN (hypertension) 10/23/2020   H/O rotator cuff syndrome 07/12/2012   Rotator cuff tendinitis 07/12/2012   SHOULDER PAIN 10/30/2007    Orientation RESPIRATION BLADDER Height & Weight     Self  Normal Incontinent Weight: 114 lb 13.8 oz (52.1 kg) Height:  5\' 4"  (162.6 cm)  BEHAVIORAL SYMPTOMS/MOOD NEUROLOGICAL BOWEL  NUTRITION STATUS      Incontinent Feeding tube (PEJ)  AMBULATORY STATUS COMMUNICATION OF NEEDS Skin   Extensive Assist Verbally Other (Comment) (Pressure injury Stage 2 buttocks)                       Personal Care Assistance Level of Assistance  Bathing, Feeding, Dressing Bathing Assistance: Maximum assistance Feeding assistance: Maximum assistance Dressing Assistance: Maximum assistance     Functional Limitations Info  Sight, Hearing, Speech Sight Info: Adequate Hearing Info: Adequate Speech Info: Adequate    SPECIAL CARE FACTORS FREQUENCY  PT (By licensed PT)     PT Frequency: 5x              Contractures Contractures Info: Not present    Additional Factors Info  Code Status, Allergies Code Status Info: DNR Allergies Info: Cyclobenzaprine Hcl           Current Medications (02/05/2021):  This is the current hospital active medication list Current Facility-Administered Medications  Medication Dose Route Frequency Provider Last Rate Last Admin   acetaminophen (TYLENOL) tablet 650 mg  650 mg Per Tube Q6H PRN 04/07/2021, MD   650 mg at 02/04/21 0908   chlorhexidine (PERIDEX) 0.12 % solution 15 mL  15 mL Mouth Rinse BID 04/06/21, MD   15 mL at 02/05/21 04/07/21   Chlorhexidine Gluconate Cloth 2 % PADS 6 each  6 each Topical Daily 2458, MD   6 each at 02/05/21 0824   feeding supplement (OSMOLITE 1.2 CAL) liquid 1,000 mL  1,000 mL Per Tube Continuous  Vassie Loll, MD 55 mL/hr at 02/04/21 2230 1,000 mL at 02/04/21 2230   free water 150 mL  150 mL Per Tube QID Shon Hale, MD       glycopyrrolate (ROBINUL) injection 0.1 mg  0.1 mg Intravenous Q6H PRN Vassie Loll, MD   0.1 mg at 01/30/21 1607   insulin aspart (novoLOG) injection 0-9 Units  0-9 Units Subcutaneous Q6H Vassie Loll, MD   2 Units at 02/05/21 1203   iohexol (OMNIPAQUE) 300 MG/ML solution 50 mL  50 mL Per Tube Once PRN Gilmer Mor, DO       MEDLINE mouth rinse  15 mL Mouth Rinse  q12n4p Vassie Loll, MD   15 mL at 02/05/21 1532   metoprolol tartrate (LOPRESSOR) tablet 12.5 mg  12.5 mg Oral BID Emokpae, Courage, MD   12.5 mg at 02/05/21 1191   neomycin-bacitracin-polymyxin (NEOSPORIN) ointment packet 1 application  1 application Topical Daily Gilmer Mor, DO   1 application at 02/05/21 0840   oxyCODONE (ROXICODONE INTENSOL) 20 MG/ML concentrated solution 5 mg  5 mg Per Tube Q12H PRN Emokpae, Courage, MD       pantoprazole sodium (PROTONIX) 40 mg/20 mL oral suspension 40 mg  40 mg Per Tube BID Vassie Loll, MD   40 mg at 02/05/21 4782   prochlorperazine (COMPAZINE) injection 10 mg  10 mg Intravenous Q6H PRN Vassie Loll, MD       scopolamine (TRANSDERM-SCOP) 1 MG/3DAYS 1.5 mg  1 patch Transdermal Q72H Vassie Loll, MD   1.5 mg at 02/04/21 2222   sodium chloride flush (NS) 0.9 % injection 10-40 mL  10-40 mL Intracatheter Q12H Vassie Loll, MD   10 mL at 02/05/21 0840   sodium chloride flush (NS) 0.9 % injection 10-40 mL  10-40 mL Intracatheter PRN Vassie Loll, MD   10 mL at 02/02/21 2215     Discharge Medications: Please see discharge summary for a list of discharge medications.  Relevant Imaging Results:  Relevant Lab Results:   Additional Information Pt SSN [id number]  Annice Needy, LCSW

## 2021-02-05 NOTE — Care Management Important Message (Signed)
Important Message  Patient Details  Name: Kathryn [name] MRN: [id number] Date of Birth: Jun 03, 1941   Medicare Important Message Given:  Yes     Corey Harold 02/05/2021, 11:51 AM

## 2021-02-05 NOTE — TOC Progression Note (Signed)
Transition of Care [facility]) - Progression Note    Patient Details  Name: Kathryn [name] MRN: [id number] Date of Birth: 10-May-1941  Transition of Care [facility]) CM/SW Contact  Annice Needy, [place] Phone Number: 02/05/2021, 4:23 PM  Clinical Narrative:    Patient is approved for SNF.  02/05/21 - 02/09/21, next review date 02/09/21, Vesta Mixer id # [id number]. Daughter, [name], chooses bed [facility].     Expected Discharge Plan: Skilled Nursing Facility Barriers to Discharge: Continued Medical Work up  Expected Discharge Plan and Services Expected Discharge Plan: Skilled Nursing Facility         Expected Discharge Date: 02/05/21                                     Social Determinants of Health (SDOH) Interventions    Readmission Risk Interventions Readmission Risk Prevention Plan 02/03/2021  Transportation Screening Complete  PCP or Specialist Appt within 3-5 Days Complete  HRI or Home Care Consult Complete  Social Work Consult for Recovery Care Planning/Counseling Complete  Palliative Care Screening Not Applicable  Medication Review Oceanographer) Complete  Some recent data might be hidden

## 2021-02-05 NOTE — Progress Notes (Signed)
Patient's family member states that patient is never this out of it. Patient is alert and oriented to person and place for me. I was told in report the same thing from night shift. They were wanting to know what kind of pain medicine she has been given. I told her that she has morphine and oxycodone PRN. She was given oxycodone yesterday according to the [facility]. Patient's family member states that it is still in her system and needs to be taken out of her orders to be given because patient can not handle medicines like that. MD Courage notified.

## 2021-02-05 NOTE — Progress Notes (Signed)
PROGRESS NOTE    Kathryn [name]  SWN:[id number] DOB: July 05, 1940 DOA: 01/13/2021 PCP: Suzan Slick, MD   Brief Narrative:   Kathryn [name] is a 80 y.o. female with medical history significant for  hypertension, type 2 diabetes mellitus, GERD, positive ANA and SSA autoantibodies, and chronic achalasia (2014 manometry showed abnormal motility)  who presents to the emergency department due to more than 2 months history of decreased oral intake, generalized weakness and about 3 to 4-week onset of abdominal pain associated with difficulty in being able to swallow (solid and liquid).  She believes that she must have lost up to 30 pounds since onset of symptoms.  Patient endorsed that she usually regurgitates any food/liquid ingested within 15 to 20 minutes of ingestion.  Abdominal pain was in the epigastric area and was sharp in nature, this worsens with food.  She denies fever, chills, chest pain, shortness of breath.   She was admitted to the hospital from 12/25/2020-12/28/2020, at that time barium p.o. esophagram done urgently as an outpatient was indicated.  She was also admitted from 10/23/2020 to 10/25/2020 during which patient presented with proximal muscle weakness with an elevated CPK of 1206 that was concerning for polymyalgia rheumatica. This was treated with prednisone with improvement.  EGD done on 10/04/2020 showed normal dilated esophagus and small hiatal hernia.   In the emergency department, she was intermittently tachypneic and tachycardic.  Other vital signs were within normal range.  Work-up in the ED showed normocytic anemia and leukocytosis, BMP was normal except for elevated BUN at and hyperglycemia with CBG of 135.  Albumin 3.2. CT of head without contrast showed no acute intracranial abnormality. IV hydration was provided, hospitalist was asked to admit patient for further evaluation and management.  -Patient continued to experience significant epigastric pain and underwent EGD with  Botox treatment on 7/21.  She continued to have difficulty swallowing and plans were being made for PEG placement which patient was agreeable to.  She underwent open gastrostomy tube placement on 7/28 and then had dislodgment on 7/29 which required subsequent placement on that date.  She started on trickle feeds on 7/30 and feeds were being advanced, but unfortunately on 8/1 she had recurrent aspiration and is now on Unasyn for coverage.  Tube feeds had to be discontinued and status post GJ tube conversion by IR; maximizing tube feedings and discontinue TPN.  Patient will need a skilled nursing facility at discharge for rehabilitation and further care.  Assessment & Plan:   Principal Problem:   Abdominal pain Active Problems:   HTN (hypertension)   Dysphagia   Nausea and vomiting   Generalized weakness   Dehydration   Normocytic anemia   Leukocytosis   Hyperglycemia   Hypoalbuminemia due to protein-calorie malnutrition (HCC)   Prolonged QT interval   Achalasia   Malnutrition of moderate degree   Hypoactive bowel sounds   Dislodged gastrostomy tube   Constipation   Pressure injury of skin  Epigastric/Abdominal pain, nausea and vomiting in the setting of esophageal dysphagia Patient is ANA/SSA antibody positive. Sjogren's syndrome and SLE are on the differential. Patient also has had increased oral secretion, suggestive of sicca-related symptoms. Gastrointestinal involvement of SLE can manifest as esophageal motility disorder, such as achalasia, which has been suggesting in this patient. Other GI manifestations of SLE include acute pancreatitis, for which  patient signs and symptoms are concerning, but CT 6/30 has shown no acute pancreatic findings. EGD in April 2022 showed unremarkable esophagus, and patient  underwent dialtion given history of dysphagia, but patient states that did not help with symptoms. --Continue Protonix 40 mg po BID -Continue clear liquid diet with plan to advanced  diet as tolerated -Gastroenterology following for further recommendations -ongoing goals of care discussions with palliative team, patient and daughter regarding tube feeding -Patient has undergone G-tube placement on 01/22/21 with dislodgment noted on 01/23/21, which has undergone replacement, --S/p  GJ tube conversion by IR  on 02/02/21  - PICC line placed 01/26/21 and TPN initiated, TPN has since been discontinued -Continue Osmolite 1.2 @ 30 ml/hr via PEJ. Continue to advance 10 ml every 4 hours to goal rate of 55 ml/hr. -currently at 55 mL an hour C/n  free water flushes- 150 ml qid per tube  Tube feeding regimen provides 1584 kcal, 73.2 grams of protein, and 1082 ml of H2O. .  Aspiration pneumonia -Patient has completed treatment with Unasyn. -No requiring oxygen supplementation no using accessory muscles. -  Generalized weakness and dehydration in the setting of above -Continue protein supplement IR converted her G-tube to GJ tube as above   Urinary tract infection - TREATED - UA in the ED showed many bacteria, moderate ketones, nitrites -Patient has completed IV antibiotic therapy on 01/17/2021.  Normocytic anemia -Chronic and Stable   COVID-19 infection (incidental finding) -Patient is asymptomatic and stable on room air -This was already diagnosed during last admission and no treatment has been required; patient is out of isolation at this time.   Essential hypertension -Metoprolol 12.5 mg twice daily  Hyperglycemia secondary to T2DM -Hemoglobin A1c on 12/25/20-----5.8% -Continue sliding scale insulin for elevated CBGs in the setting of TPN and tube feedings.   GERD -Continue Protonix  DNR -Continue to treat what is treatable -No intubation, no mechanical ventilatory support and no resuscitation. -Wishes and goals of care discussion will be respected. -Daughter Winnifred who goes by [name] [phone]  Helps patient make decisions  Physical deconditioning -Patient  has been seen by physical therapy with recommendation to skilled nursing facility placement at discharge for rehabilitation and care. -Discussed with daughter at bedside, in agreement with rehabilitation placement. -TOC has been made aware.    DVT prophylaxis:SCD Code Status: DNR Family Communication: D/w Daughter Winnifred who goes by [place] at [phone] - Disposition Plan:  Status is: Inpatient   Remains inpatient appropriate because:Ongoing diagnostic testing needed not appropriate for outpatient work up and IV treatments appropriate due to intensity of illness or inability to take PO   Dispo: The patient is from: Home              Anticipated d/c is to: SNF              Patient currently is not medically stable to d/c.              Difficult to place patient No   Consultants:  Gastroenterology Palliative care Surgery (regarding PEG placement) IR   Procedures:  Barium swallow 01/14/21 EGD with botox injection 01/15/21 Open gastrostomy tube placement with lysis of adhesions and small bowel resection 01/22/21 Repeat placement of open gastrostomy tube on 01/23/21 PICC line for TPN 01/26/21 GJ tube conversion on 02/02/2021 by IR  Antimicrobials:  Anti-infectives (From admission, onward)    Start     Dose/Rate Route Frequency Ordered Stop   01/26/21 0100  Ampicillin-Sulbactam (UNASYN) 3 g in sodium chloride 0.9 % 100 mL IVPB        3 g 200 mL/hr over 30 Minutes Intravenous Every 6  hours 01/26/21 0009 02/03/21 0259   01/26/21 0100  doxycycline (VIBRAMYCIN) 100 mg in sodium chloride 0.9 % 250 mL IVPB  Status:  Discontinued        100 mg 125 mL/hr over 120 Minutes Intravenous Every 12 hours 01/26/21 0009 01/26/21 0745   01/23/21 1300  cefoTEtan (CEFOTAN) 2 g in sodium chloride 0.9 % 100 mL IVPB        2 g 200 mL/hr over 30 Minutes Intravenous On call to O.R. 01/23/21 0959 01/23/21 1952   01/23/21 1157  sodium chloride 0.9 % with cefoTEtan (CEFOTAN) ADS Med       Note to Pharmacy:  Devoria Glassing   : cabinet override      01/23/21 1157 01/23/21 1411   01/22/21 0600  ceFAZolin (ANCEF) IVPB 2g/100 mL premix        2 g 200 mL/hr over 30 Minutes Intravenous On call to O.R. 01/21/21 1747 01/22/21 0900   01/14/21 0830  cefTRIAXone (ROCEPHIN) 1 g in sodium chloride 0.9 % 100 mL IVPB        1 g 200 mL/hr over 30 Minutes Intravenous Every 24 hours 01/14/21 0735 01/16/21 1023   01/14/21 0800  fosfomycin (MONUROL) packet 3 g  Status:  Discontinued        3 g Oral  Once 01/14/21 0711 01/14/21 0734       Subjective:  - Tube feeds increased to goal No fever  Or chills   No Nausea, Vomiting or Diarrhea   Objective: Vitals:   02/05/21 0500 02/05/21 0838 02/05/21 0900 02/05/21 1231  BP:  129/74  119/65  Pulse:  (!) 112 90 (!) 102  Resp:    16  Temp:    98.1 F (36.7 C)  TempSrc:    Oral  SpO2:    100%  Weight: 52.1 kg     Height:        Intake/Output Summary (Last 24 hours) at 02/05/2021 1625 Last data filed at 02/05/2021 0600 Gross per 24 hour  Intake 0 ml  Output --  Net 0 ml   Filed Weights   02/03/21 0500 02/04/21 0530 02/05/21 0500  Weight: 49.3 kg 52.7 kg 52.1 kg    Examination:  Physical Exam  Gen:- Awake Alert, chronically ill-appearing HEENT:- [place].AT, No sclera icterus Neck-Supple Neck,No JVD,.  Lungs-  CTAB , fair air movement CV- S1, S2 normal, RRR Abd-  +ve B.Sounds, Abd Soft, No tenderness, GJ tube site clean dry intact    Extremity/Skin:- No  edema,   good pedal pulses Psych-affect is flat, oriented x3 Neuro-generalized weakness, no new focal deficits, no tremors   Data Reviewed: I have personally reviewed following labs and imaging studies  CBC: Recent Labs  Lab 02/02/21 0528 02/05/21 0358  WBC 15.1* 9.4  NEUTROABS 13.6*  --   HGB 8.8* 7.9*  HCT 27.1* 25.0*  MCV 97.5 98.0  PLT 255 306   Basic Metabolic Panel: Recent Labs  Lab 01/31/21 0545 02/02/21 0528 02/03/21 0329 02/05/21 0358  NA 132* 128* 131* 130*  K 4.6  4.4 4.6 4.6  CL 103 99 102 101  CO2 25 25 24 25   GLUCOSE 202* 212* 211* 207*  BUN 14 15 18 10   CREATININE 0.36* 0.38* 0.38* 0.41*  CALCIUM 8.5* 8.3* 8.2* 8.1*  MG 2.0 1.9  --   --   PHOS 3.5 3.2  --   --    GFR: Estimated Creatinine Clearance: 45.4 mL/min (A) (by C-G formula based on SCr  of 0.41 mg/dL (L)).  Liver Function Tests: Recent Labs  Lab 01/31/21 0545 02/02/21 0528 02/03/21 0329  AST 47* 66* 36  ALT 29 63* 46*  ALKPHOS 133* 188* 152*  BILITOT 0.5 0.5 0.4  PROT 5.8* 6.0* 5.7*  ALBUMIN 1.9* 1.9* 1.8*   CBG: Recent Labs  Lab 02/04/21 1632 02/04/21 2107 02/05/21 0550 02/05/21 1139 02/05/21 1611  GLUCAP 98 129* 214* 169* 119*   Radiology Studies: No results found.  Scheduled Meds:  chlorhexidine  15 mL Mouth Rinse BID   Chlorhexidine Gluconate Cloth  6 each Topical Daily   free water  200 mL Per Tube QID   insulin aspart  0-9 Units Subcutaneous Q6H   mouth rinse  15 mL Mouth Rinse q12n4p   metoprolol tartrate  12.5 mg Oral BID   neomycin-bacitracin-polymyxin  1 application Topical Daily   pantoprazole sodium  40 mg Per Tube BID   scopolamine  1 patch Transdermal Q72H   sodium chloride flush  10-40 mL Intracatheter Q12H   Continuous Infusions:  feeding supplement (OSMOLITE 1.2 CAL) 1,000 mL (02/04/21 2230)    LOS: 23 days   Shon Haleourage Mairin Lindsley, MD [facility]  If 7PM-7AM, please contact night-coverage [email or web address] 02/05/2021, 4:25 PM

## 2021-02-06 LAB — CBC
HCT: 26.4 % — ABNORMAL LOW (ref 36.0–46.0)
Hemoglobin: 8.2 g/dL — ABNORMAL LOW (ref 12.0–15.0)
MCH: 30.7 pg (ref 26.0–34.0)
MCHC: 31.1 g/dL (ref 30.0–36.0)
MCV: 98.9 fL (ref 80.0–100.0)
Platelets: 301 10*3/uL (ref 150–400)
RBC: 2.67 MIL/uL — ABNORMAL LOW (ref 3.87–5.11)
RDW: 14.7 % (ref 11.5–15.5)
WBC: 10.4 10*3/uL (ref 4.0–10.5)
nRBC: 0 % (ref 0.0–0.2)

## 2021-02-06 LAB — BASIC METABOLIC PANEL
Anion gap: 5 (ref 5–15)
BUN: 12 mg/dL (ref 8–23)
CO2: 24 mmol/L (ref 22–32)
Calcium: 7.7 mg/dL — ABNORMAL LOW (ref 8.9–10.3)
Chloride: 98 mmol/L (ref 98–111)
Creatinine, Ser: 0.41 mg/dL — ABNORMAL LOW (ref 0.44–1.00)
GFR, Estimated: >60 mL/min (ref >60)
Glucose, Bld: 165 mg/dL — ABNORMAL HIGH (ref 70–99)
Potassium: 4.5 mmol/L (ref 3.5–5.1)
Sodium: 127 mmol/L — ABNORMAL LOW (ref 135–145)

## 2021-02-06 LAB — GLUCOSE, CAPILLARY
Glucose-Capillary: 162 mg/dL — ABNORMAL HIGH (ref 70–99)
Glucose-Capillary: 173 mg/dL — ABNORMAL HIGH (ref 70–99)
Glucose-Capillary: 200 mg/dL — ABNORMAL HIGH (ref 70–99)

## 2021-02-06 MED ORDER — ASPIRIN 81 MG PO TABS: 81.0000 mg | ORAL_TABLET | Freq: Every day | ORAL | 5 refills | Status: DC

## 2021-02-06 MED ORDER — METOPROLOL TARTRATE 25 MG PO TABS: 12.5000 mg | ORAL_TABLET | Freq: Two times a day (BID) | ORAL | 5 refills | Status: DC

## 2021-02-06 MED ORDER — HYDROCODONE-ACETAMINOPHEN 7.5-325 MG/15ML PO SOLN: 10.0000 mL | Freq: Two times a day (BID) | ORAL | 0 refills | Status: DC | PRN

## 2021-02-06 MED ORDER — ACETAMINOPHEN 325 MG PO TABS: 650.0000 mg | ORAL_TABLET | Freq: Four times a day (QID) | ORAL | 2 refills | Status: DC | PRN

## 2021-02-06 MED ORDER — FREE WATER: 100.0000 mL | Freq: Three times a day (TID) | 2 refills | Status: DC

## 2021-02-06 MED ORDER — SCOPOLAMINE 1 MG/3DAYS TD PT72: 1.0000 | MEDICATED_PATCH | TRANSDERMAL | 12 refills | Status: DC

## 2021-02-06 MED ORDER — LANSOPRAZOLE 3 MG/ML SUSP: 15.0000 mg | Freq: Every day | ORAL | 5 refills | Status: DC

## 2021-02-06 MED ORDER — OSMOLITE 1.2 CAL PO LIQD: 1000.0000 mL | ORAL | 11 refills | Status: DC

## 2021-02-06 MED ORDER — INSULIN LISPRO (1 UNIT DIAL) 100 UNIT/ML (KWIKPEN): 0.0000 [IU] | PEN_INJECTOR | Freq: Three times a day (TID) | SUBCUTANEOUS | 11 refills | Status: DC

## 2021-02-06 NOTE — Discharge Instructions (Addendum)
1)Osmolite 1.5 @  Tube feed at 50 ml/hr via PEJ.Marland Kitchen 2)Free water flushes- 100 ml TID per tube  3)Follow up with Rheumatologist--- Dr. Stefano Gaul Aryal----Address: [address] [place], [place], [place] [address], Phone: [phone]----- for possible---. Sjogren's syndrome and SLE , she has  had increased oral secretion, suggestive of sicca-related symptoms. Gastrointestinal involvement of SLE can manifest as esophageal motility disorder, such as achalasia, she has  ANA/SSA antibody positive 4)Please keep head of bed at 30 to 45 degrees while tube feeding is running to reduce risk of aspiration 5)Repeat BMP and CBC on Monday 02/09/21 6)insulin Humalog injection 0-10 Units 0-10 Units Subcutaneous, 3 times daily with meals CBG < 70: Implement Hypoglycemia Standing Orders and refer to Hypoglycemia Standing Orders sidebar report  CBG 70 - 120: 0 unit CBG 121 - 150: 0 unit  CBG 151 - 200: 1 unit CBG 201 - 250: 2 units CBG 251 - 300: 4 units CBG 301 - 350: 6 units  CBG 351 - 400: 8 units  CBG > 400: 10 units

## 2021-02-06 NOTE — TOC Transition Note (Signed)
Transition of Care [facility]) - CM/SW Discharge Note   Patient Details  Name: Kathryn [name] MRN: [id number] Date of Birth: January 10, 1941  [facility] CM/SW Contact:  Elliot Gault, LCSW Phone Number: 02/06/2021, 11:42 AM   Clinical Narrative:     Pt stable for dc per MD. Zigmund Gottron at [facility] and they can accept pt today. Updated daughter who remains in agreement with dc plan.   DC clinical sent electronically. RN to call report. Pt will transfer with EMS.  There are no other TOC needs for dc.  Final next level of care: Skilled Nursing Facility Barriers to Discharge: Barriers Resolved   Patient Goals and CMS Choice Patient states their goals for this hospitalization and ongoing recovery are:: Rehab with SNF CMS Medicare.gov Compare Post Acute Care list provided to:: Patient Choice offered to / list presented to : Patient  Discharge Placement              Patient chooses bed at: [facility] Patient to be transferred to facility by: ems Name of family member notified: Winnifred Pickett Patient and family notified of of transfer: 02/06/21  Discharge Plan and Services                                     Social Determinants of Health (SDOH) Interventions     Readmission Risk Interventions Readmission Risk Prevention Plan 02/03/2021  Transportation Screening Complete  PCP or Specialist Appt within 3-5 Days Complete  HRI or Home Care Consult Complete  Social Work Consult for Recovery Care Planning/Counseling Complete  Palliative Care Screening Not Applicable  Medication Review Oceanographer) Complete  Some recent data might be hidden

## 2021-02-06 NOTE — Discharge Summary (Addendum)
Kathryn PlantsShirley M [name], is a 80 y.o. female  DOB May 06, 1941  MRN [id number].  Admission date:  01/13/2021  Admitting Physician  Frankey Shownladapo Adefeso, DO  Discharge Date:  02/06/2021   Primary MD  Suzan Slickucker, Alethea Y, MD  Recommendations for primary care physician for things to follow:   1)Osmolite 1.5 @  Tube feed at 50 ml/hr via PEJ. 2)Free water flushes- 100 ml TID per tube  3)Follow up with Rheumatologist--- Dr. Stefano GaulGovinda Aryal----Address: [address], [place], [place] [address], Phone: [phone]----- for possible---. Sjogren's syndrome and SLE , she has  had increased oral secretion, suggestive of sicca-related symptoms. Gastrointestinal involvement of SLE can manifest as esophageal motility disorder, such as achalasia, she has  ANA/SSA antibody positive 4)Please keep head of bed at 30 to 45 degrees while tube feeding is running to reduce risk of aspiration 5)Repeat BMP and CBC on Monday 02/09/21 6)insulin Humalog injection 0-10 Units 0-10 Units Subcutaneous, 3 times daily with meals CBG < 70: Implement Hypoglycemia Standing Orders and refer to Hypoglycemia Standing Orders sidebar report  CBG 70 - 120: 0 unit CBG 121 - 150: 0 unit  CBG 151 - 200: 1 unit CBG 201 - 250: 2 units CBG 251 - 300: 4 units CBG 301 - 350: 6 units  CBG 351 - 400: 8 units  CBG > 400: 10 units   Admission Diagnosis  Dehydration [E86.0] Nausea and vomiting [R11.2]   Discharge Diagnosis  Dehydration [E86.0] Nausea and vomiting [R11.2]    Principal Problem:   Abdominal pain Active Problems:   HTN (hypertension)   Dysphagia   Nausea and vomiting   Generalized weakness   Dehydration   Normocytic anemia   Leukocytosis   Hyperglycemia   Hypoalbuminemia due to protein-calorie malnutrition (HCC)   Prolonged QT interval   Achalasia   Malnutrition of moderate degree   Hypoactive bowel sounds   Dislodged gastrostomy tube    Constipation   Pressure injury of skin      Past Medical History:  Diagnosis Date   Acid reflux    Diabetes (HCC)    controlled with diet.    HTN (hypertension)     Past Surgical History:  Procedure Laterality Date   ABDOMINAL HYSTERECTOMY     BOTOX INJECTION N/A 01/15/2021   Procedure: BOTOX INJECTION;  Surgeon: Lanelle Balarver, Charles K, DO;  [facility];  Service: Endoscopy;  Laterality: N/A;   BOWEL RESECTION N/A 01/22/2021   Procedure: SMALL BOWEL RESECTION;  Surgeon: Lucretia RoersBridges, Lindsay C, MD;  Location: [facility];  Service: General;  Laterality: N/A;   CHOLECYSTECTOMY     ESOPHAGEAL DILATION N/A 10/24/2020   Procedure: ESOPHAGEAL DILATION;  Surgeon: Corbin Adeourk, Robert M, MD;  [facility];  Service: Endoscopy;  Laterality: N/A;   ESOPHAGOGASTRODUODENOSCOPY N/A 10/24/2020   Procedure: ESOPHAGOGASTRODUODENOSCOPY (EGD);  Surgeon: Corbin Adeourk, Robert M, MD;  [facility];  Service: Endoscopy;  Laterality: N/A;   ESOPHAGOGASTRODUODENOSCOPY (EGD) WITH PROPOFOL N/A 01/15/2021   Procedure: ESOPHAGOGASTRODUODENOSCOPY (EGD) WITH PROPOFOL;  Surgeon:  Lanelle Bal, DO;  [facility];  Service: Endoscopy;  Laterality: N/A;   GASTROSTOMY N/A 01/23/2021   Procedure: REINSERTION OF GASTROSTOMY TUBE;  Surgeon: Lucretia Roers, MD;  Location: [facility];  Service: General;  Laterality: N/A;   GASTROSTOMY N/A 01/22/2021   Procedure: INSERTION OF GASTROSTOMY TUBE;  Surgeon: Lucretia Roers, MD;  Location: [facility];  Service: General;  Laterality: N/A;   Histosalpingogram     IR GASTR TUBE CONVERT GASTR-JEJ PER W/FL MOD SED  02/02/2021   LYSIS OF ADHESION  01/22/2021   Procedure: LYSIS OF ADHESION;  Surgeon: Lucretia Roers, MD;  Location: [facility];  Service: General;;     HPI  from the history and physical done on the day of admission:    Chief Complaint: Abdominal pain   HPI: Kathryn [name] is a 80 y.o. female with medical history significant for  hypertension, type 2  diabetes mellitus, GERD, positive ANA and SSA autoantibodies who presents to the emergency department due to more than 2 months history of decreased oral intake, generalized weakness and about 3 to 4-week onset of abdominal pain associated with difficulty in being able to swallow (solid and liquid).  She believes that she must have lost up to 30 pounds since onset of symptoms.  Patient endorsed that she usually regurgitates any food/liquid ingested within 15 to 20 minutes of ingestion.  Abdominal pain was in the epigastric area and was sharp in nature, this worsens with food.  She denies fever, chills, chest pain, shortness of breath. She was admitted to the hospital from 12/25/2020-12/28/2020, at that time barium p.o. esophagram done urgently as an outpatient was indicated.  She was also admitted from 10/23/2020 to 10/25/2020 during which patient presented with proximal muscle weakness with an elevated CPK of 1206, this was treated with prednisone with improvement.  EGD done on 10/04/2020 showed normal dilated esophagus and small hiatal hernia.   ED Course: In the emergency department, she was intermittently tachypneic and tachycardic.  Other vital signs were within normal range.  Work-up in the ED showed normocytic anemia and leukocytosis, BMP was normal except for elevated BUN at and hyperglycemia with CBG of 135.  Albumin 3.2. CT of head without contrast showed no acute intracranial abnormality. IV hydration was provided, hospitalist was asked to admit patient for further evaluation and management.    Hospital Course:    Epigastric/Abdominal pain, nausea and vomiting in the setting of esophageal dysphagia Patient is ANA/SSA antibody positive. Sjogren's syndrome and SLE are on the differential. Patient also has had increased oral secretion, suggestive of sicca-related symptoms. Gastrointestinal involvement of SLE can manifest as esophageal motility disorder, such as achalasia, which has been suggesting in  this patient. Other GI manifestations of SLE include acute pancreatitis, for which  patient signs and symptoms are concerning, but CT 6/30 has shown no acute pancreatic findings. EGD in April 2022 showed unremarkable esophagus, and patient underwent dialtion given history of dysphagia, but patient states that did not help with symptoms. --Continue PPI  -Continue clear liquid diet  -Gastroenterology following for further recommendations -ongoing goals of care discussions with palliative team, patient and daughter regarding tube feeding -Patient has undergone G-tube placement on 01/22/21 with dislodgment noted on 01/23/21, which has undergone replacement, --S/p  GJ tube conversion by IR  on 02/02/21  -Osmolite 1.5 @  Tube feed at 50 ml/hr via PEJ.. Free water flushes- 100 ml TID per tube  Tube feeding regimen provides 1584 kcal, 73.2  grams of protein, and 1082 ml of H2O. .   Aspiration pneumonia -Patient has completed treatment with Unasyn. -No requiring oxygen supplementation no using accessory muscles. -  Generalized weakness and dehydration in the setting of above -Continue protein supplement IR converted her G-tube to GJ tube as above   Urinary tract infection - TREATED - UA in the ED showed many bacteria, moderate ketones, nitrites -Patient has completed IV antibiotic therapy on 01/17/2021.  Normocytic anemia -Chronic and Stable   COVID-19 infection (incidental finding) -Patient is asymptomatic and stable on room air -This was already diagnosed during last admission and no treatment has been required; patient is out of isolation at this time.   Essential hypertension -Metoprolol 12.5 mg twice daily  Hyperglycemia secondary to T2DM -Hemoglobin A1c on 12/25/20-----5.8% -Continue sliding scale insulin for elevated CBGs in the setting of TPN and tube feedings.   GERD -Continue Protonix   DNR -Continue to treat what is treatable -No intubation, no mechanical ventilatory support and  no resuscitation. -Wishes and goals of care discussion will be respected. -Daughter Winnifred who goes by [name] [phone]  Helps patient make decisions   Physical deconditioning -Patient has been seen by physical therapy with recommendation to skilled nursing facility placement at discharge for rehabilitation and care. -Discussed with daughter at bedside, in agreement with rehabilitation placement.  Excessive Secretions--- c/n scopolamine patch q 72 hrs  HypoNatremia--due to excessive free water flushes, sodium down to 127, decrease free water flushes to 100 ml 3 times daily via PEG tube, repeat BMP on Monday  Chronic anemia--- stable, no ongoing bleeding concerns, repeat CBC on Monday    DVT prophylaxis:SCD Code Status: DNR Family Communication: D/w Daughter Winnifred who goes by [place] at [phone] - Disposition Plan:  Status is: Inpatient   Remains inpatient appropriate because:Ongoing diagnostic testing needed not appropriate for outpatient work up and IV treatments appropriate due to intensity of illness or inability to take PO   Dispo: The patient is from: Home              Anticipated d/c is to: SNF              Patient currently is not medically stable to d/c.              Difficult to place patient No   Consultants:  Gastroenterology Palliative care Surgery (regarding PEG placement) IR   Procedures:  Barium swallow 01/14/21 EGD with botox injection 01/15/21 Open gastrostomy tube placement with lysis of adhesions and small bowel resection 01/22/21 Repeat placement of open gastrostomy tube on 01/23/21 PICC line for TPN 01/26/21 GJ tube conversion on 02/02/2021 by IR  Discharge Condition: Stable  Follow UP   Contact information for follow-up providers     Casimer Lanius, MD. Schedule an appointment as soon as possible for a visit in 1 week(s).   Specialty: Rheumatology Why: Possibe SLE Contact information: [address] [place]  [address] [phone]              Contact information for after-discharge care     Destination     [facility] .   Service: Skilled Nursing Contact information: [address] [place] [address] [phone]                     Diet and Activity recommendation:  As advised  Discharge Instructions    Discharge Instructions     Call MD  for:  difficulty breathing, headache or visual disturbances   Complete by: As directed    Call MD for:  persistant dizziness or light-headedness   Complete by: As directed    Call MD for:  persistant nausea and vomiting   Complete by: As directed    Call MD for:  redness, tenderness, or signs of infection (pain, swelling, redness, odor or green/yellow discharge around incision site)   Complete by: As directed    Call MD for:  severe uncontrolled pain   Complete by: As directed    Call MD for:  temperature >100.4   Complete by: As directed    Discharge instructions   Complete by: As directed    1)Osmolite 1.5 @  Tube feed at 50 ml/hr via PEJ.Marland Kitchen 2)Free water flushes- 100 ml TID per tube  3)Follow up with Rheumatologist--- Dr. Stefano Gaul Aryal----Address: [address] [place], [place], [place] [address], Phone: [phone]----- for possible---. Sjogren's syndrome and SLE , she has  had increased oral secretion, suggestive of sicca-related symptoms. Gastrointestinal involvement of SLE can manifest as esophageal motility disorder, such as achalasia, she has  ANA/SSA antibody positive 4)Please keep head of bed at 30 to 45 degrees while tube feeding is running to reduce risk of aspiration 5)Repeat BMP and CBC on Monday 02/09/21 6)insulin Humalog injection 0-10 Units 0-10 Units Subcutaneous, 3 times daily with meals CBG < 70: Implement Hypoglycemia Standing Orders and refer to Hypoglycemia Standing Orders sidebar report  CBG 70 - 120: 0 unit CBG 121 - 150: 0 unit  CBG 151 - 200: 1  unit CBG 201 - 250: 2 units CBG 251 - 300: 4 units CBG 301 - 350: 6 units  CBG 351 - 400: 8 units  CBG > 400: 10 units   Discharge wound care:   Complete by: As directed    As advised   Increase activity slowly   Complete by: As directed          Discharge Medications     Allergies as of 02/06/2021       Reactions   Cyclobenzaprine Hcl Swelling        Medication List     STOP taking these medications    losartan 50 MG tablet Commonly known as: COZAAR   pantoprazole 40 MG tablet Commonly known as: PROTONIX       TAKE these medications    acetaminophen 325 MG tablet Commonly known as: TYLENOL Place 2 tablets (650 mg total) into feeding tube every 6 (six) hours as needed for mild pain, fever or headache.   aspirin 81 MG tablet Take 1 tablet (81 mg total) by mouth daily.   feeding supplement (OSMOLITE 1.2 CAL) Liqd Place 1,000 mLs into feeding tube continuous.   free water Soln Place 100 mLs into feeding tube 3 (three) times daily.   HYDROcodone-acetaminophen 7.5-325 mg/15 ml solution Commonly known as: HYCET Take 10 mLs by mouth every 12 (twelve) hours as needed for moderate pain or severe pain.   insulin lispro 100 UNIT/ML KwikPen Commonly known as: HumaLOG KwikPen Inject 0-10 Units into the skin 3 (three) times daily. insulin Humalog injection 0-10 Units 0-10 Units Subcutaneous, 3 times daily with meals CBG < 70: Implement Hypoglycemia Standing Orders and refer to Hypoglycemia Standing Orders sidebar report  CBG 70 - 120: 0 unit CBG 121 - 150: 0 unit  CBG 151 - 200: 1 unit CBG 201 - 250: 2 units CBG 251 - 300: 4 units CBG 301 - 350:  6 units  CBG 351 - 400: 8 units  CBG > 400: 10 units   lansoprazole 3 mg/ml Susp oral suspension Commonly known as: PREVACID Place 5 mLs (15 mg total) into feeding tube daily at 12 noon.   metoprolol tartrate 25 MG tablet Commonly known as: LOPRESSOR Take 0.5 tablets (12.5 mg total) by mouth 2 (two) times daily. What  changed:  how much to take Another medication with the same name was removed. Continue taking this medication, and follow the directions you see here.   scopolamine 1 MG/3DAYS Commonly known as: TRANSDERM-SCOP Place 1 patch (1.5 mg total) onto the skin every 3 (three) days.   triamcinolone ointment 0.1 % Commonly known as: KENALOG Apply topically.               Discharge Care Instructions  (From admission, onward)           Start     Ordered   02/06/21 0000  Discharge wound care:       Comments: As advised   02/06/21 1057            Major procedures and Radiology Reports - PLEASE review detailed and final reports for all details, in brief -    DG Chest 1 View  Result Date: 01/25/2021 CLINICAL DATA:  Increased shortness of breath. EXAM: CHEST  1 VIEW COMPARISON:  Radiograph 01/12/2019 FINDINGS: Lower lung volumes from prior exam. Normal heart size with stable mediastinal contours. Development of patchy airspace disease in the right perihilar and right lung base. Minor left lung base atelectasis. Chronic elevation of left hemidiaphragm, accentuated by lower lung volumes. There may be a small right pleural effusion. No pneumothorax. There is mild residual barium in the upper abdomen from prior esophagram. IMPRESSION: Development of patchy airspace disease in the right perihilar and right lung base suspicious for pneumonia, aspiration is considered. Possible small right pleural effusion. Electronically Signed   By: Narda Rutherford M.D.   On: 01/25/2021 22:02   DG Abd 1 View  Result Date: 01/20/2021 CLINICAL DATA:  Abdominal pain. EXAM: ABDOMEN - 1 VIEW COMPARISON:  CT of the abdomen on 12/04/2020, esophagram on 01/14/2021 FINDINGS: There is some residual barium in the stomach from recent esophagram. There is some gaseous distension of the colon in the region of the transverse colon and there may be a component of colonic ileus. No small bowel dilatation or signs of free  air. No abnormal calcifications. Clips related to prior cholecystectomy. Degenerative disc disease of the lumbar spine. IMPRESSION: Potential colonic ileus. Residual barium in the stomach related to recent esophagram 6 days ago. This may implicate some degree poor gastric motility/emptying. Electronically Signed   By: Irish Lack M.D.   On: 01/20/2021 12:26   CT Head Wo Contrast  Result Date: 01/13/2021 CLINICAL DATA:  Delirium. EXAM: CT HEAD WITHOUT CONTRAST TECHNIQUE: Contiguous axial images were obtained from the base of the skull through the vertex without intravenous contrast. COMPARISON:  None. FINDINGS: Brain: No evidence of acute infarction, hemorrhage, hydrocephalus, extra-axial collection or mass lesion/mass effect. Vascular: No hyperdense vessel or unexpected calcification. Skull: Normal. Negative for fracture or focal lesion. Sinuses/Orbits: No acute finding. Other: None. IMPRESSION: No acute intracranial abnormality seen. Electronically Signed   By: Lupita Raider M.D.   On: 01/13/2021 20:34   IR GASTR TUBE CONVERT GASTR-JEJ PER W/FL MOD SED  Result Date: 02/02/2021 INDICATION: 80 year old female referred for conversion of surgical gastrostomy to a gastrojejunostomy Additional history of continuous leakage  at the gastrostomy site. The patient had a surgical gastrostomy placed, 24 [place], which was displaced by the patient. A repeat surgery with replacement of a 20 French gastrostomy, balloon retention, was performed. EXAM: CONVERT G-TUBE TO G-JTUBE MEDICATIONS: None ANESTHESIA/SEDATION: Versed 1.0 mg IV; Fentanyl 50 mcg IV Moderate Sedation Time:  45 minutes The patient was continuously monitored during the procedure by the interventional radiology nurse under my direct supervision. CONTRAST:  20 cc FLUOROSCOPY TIME:  Fluoroscopy Time: 22 minutes 30 seconds (101 mGy). COMPLICATIONS: None PROCEDURE: Informed written consent was obtained from the patient and the patient's family after a  thorough discussion of the procedural risks, benefits and alternatives. All questions were addressed. Maximal Sterile Barrier Technique was utilized including caps, mask, sterile gowns, sterile gloves, sterile drape, hand hygiene and skin antiseptic. A timeout was performed prior to the initiation of the procedure. The epigastrium including the indwelling gastrostomy was prepped with Betadine in a sterile fashion, and a sterile drape was applied covering the operative field. A sterile gown and sterile gloves were used for the procedure. Contrast was infused to confirm location within the stomach. The configuration of the contrast in the stomach suggest that this gastrostomy is in the distal stomach, beyond the incisura, and near the pylorus/pyloric channel. A 65 cm Kumpe the catheter was advanced through the gastrostomy. A Glidewire was used to probe for the pylorus, unsuccessful. A Cobra catheter was then used with a Glidewire to probe for the pylorus/duodenum. This was unsuccessful. Kumpe the catheter was then used with a standard Glidewire, with a circuitous route through the cardia of the stomach, looped back to the body/pyloric channel, and ultimately through the duodenum. This redundant route allow the catheter wire combination to go below the balloon on the G-tube. This redundant catheter in wire configuration was under favorable, and ultimately could not be reduced for a more direct route through the pyloric channel. At this point we placed a tandem Bentson wire into the stomach lumen, cut the retention sutures at the skin, and deflated the balloon, partly withdrawing on the gastrostomy. Kumpe the catheter and the Cobra catheter were used to probe for the pylorus, unsuccessful. Ultimately, a 4 French omni Flush catheter was needed to direct the standard Glidewire through the pyloric channel and into the duodenum. The Omni Flush catheter was then advanced on the Glidewire. With a Glidewire in the duodenum,  the 4 [place] omni Flush catheter was withdrawn and the Kumpe the catheter was advanced on the Glidewire to the proximal jejunum. Wire was withdrawn and contrast was infused confirming the location. A 22 French gastrojejunostomy was selected. The final 15 cm was amputated. The tube was then advanced on the Glidewire after withdrawal of the Bentson wire. Approximately 5 cc of mixed contrast and saline were used to inflate the balloon. Contrast was then injected confirming location within the stomach. Patient tolerated the procedure well and remained hemodynamically stable throughout. No complications were encountered and no significant blood loss encountered. IMPRESSION: Exchange of surgical gastrostomy for a new gastrojejunostomy, as above. The site of access for the surgical gastrostomy may not be favorable for Buckels-term effectiveness. Signed, Yvone Neu. Loreta Ave, DO [facility] Electronically Signed   By: Gilmer Mor D.O.   On: 02/02/2021 15:54   DG CHEST PORT 1 VIEW  Result Date: 01/26/2021 CLINICAL DATA:  PICC line placement EXAM: PORTABLE CHEST 1 VIEW COMPARISON:  01/25/2021 FINDINGS: Right upper extremity PICC line tip is seen within the superior  cavoatrial junction. Right basilar pulmonary infiltrate again noted, compatible with infection or aspiration the appropriate clinical setting, has improved. No pneumothorax or pleural effusion. Cardiac size within normal limits. Pulmonary vascularity is normal. IMPRESSION: Right upper extremity PICC line tip within the superior cavoatrial junction. Improving right basilar consolidation. Electronically Signed   By: Helyn Numbers MD   On: 01/26/2021 19:02   [name] EKG SITE RITE  Result Date: 01/26/2021 If Site Rite image not attached, placement could not be confirmed due to current cardiac rhythm.  DG ESOPHAGUS W DOUBLE CM (HD)  Result Date: 01/14/2021 CLINICAL DATA:  Achalasia by prior esophageal manometry,  dysphagia, regurgitation EXAM: ESOPHOGRAM / BARIUM SWALLOW / BARIUM TABLET STUDY TECHNIQUE: Combined double contrast and single contrast examination performed using effervescent crystals, thick barium liquid, and thin barium liquid. 12.5 mm diameter barium tablet was not administered. FLUOROSCOPY TIME:  Fluoroscopy Time:  3 minutes 18 seconds Radiation Exposure Index (if provided by the fluoroscopic device): 55.3 mGy Number of Acquired Spot Images: multiple fluoroscopic screen captures COMPARISON:  None FINDINGS: Patient unable to safely stand for upright imaging. Study performed horizontal up to 40 degrees of head elevation. Thoracic esophagus appears dilated with relative narrowing of the gastroesophageal junction. Extremely poor esophageal motility with very weak to absent primary peristaltic waves, occasional secondary waves, and multiple non propulsive tertiary waves. Prolonged retention of contrast within thoracic esophagus despite elevation of table 40 degrees. Questionable linear filling defect at distal esophagus. Suspected ulcer along LEFT anterolateral aspect of the distal esophagus (RF#3 image 182/199 and RF#4 image 157/157)). No definite hiatal hernia. Focal area of narrowing at the cervical esophagus as well, though this could be relative related to distention of remainder of esophagus. No definite aspiration of contrast. At AP window, a potential small diverticulum or less likely additional ulcer is seen, RF#5 image109/127, RF#1 image 3/85. IMPRESSION: Dilated thoracic esophagus with extremely poor motility and relative narrowing of the GE junction, suggestive of achalasia. Probable ulcer at distal esophagus with additional diverticulum versus less likely ulcer at the level of the AP window. Questionable linear filling defect at distal esophagus such as a web or related to an ulcer. Electronically Signed   By: Ulyses Southward M.D.   On: 01/14/2021 12:47    Micro Results   Recent Results (from the past  240 hour(s))  Resp Panel by RT-PCR (Flu A&B, Covid) Nasopharyngeal Swab     Status: None   Collection Time: 02/05/21  4:02 PM   Specimen: Nasopharyngeal Swab; Nasopharyngeal(NP) swabs in vial transport medium  Result Value Ref Range Status   SARS Coronavirus 2 by RT PCR NEGATIVE NEGATIVE Final    Comment: (NOTE) SARS-CoV-2 target nucleic acids are NOT DETECTED.  The SARS-CoV-2 RNA is generally detectable in upper respiratory specimens during the acute phase of infection. The lowest concentration of SARS-CoV-2 viral copies this assay can detect is 138 copies/mL. A negative result does not preclude SARS-Cov-2 infection and should not be used as the sole basis for treatment or other patient management decisions. A negative result may occur with  improper specimen collection/handling, submission of specimen other than nasopharyngeal swab, presence of viral mutation(s) within the areas targeted by this assay, and inadequate number of viral copies(<138 copies/mL). A negative result must be combined with clinical observations, patient history, and epidemiological information. The expected result is Negative.  Fact Sheet for Patients:  BloggerCourse.com  Fact Sheet for Healthcare Providers:  SeriousBroker.it  This test is no t yet approved or cleared by the  [place] [employer] and  has been authorized for detection and/or diagnosis of SARS-CoV-2 by FDA under an TEFL teacher (EUA). This EUA will remain  in effect (meaning this test can be used) for the duration of the COVID-19 declaration under Section 564(b)(1) of the Act, 21 U.S.C.section 360bbb-3(b)(1), unless the authorization is terminated  or revoked sooner.       Influenza A by PCR NEGATIVE NEGATIVE Final   Influenza B by PCR NEGATIVE NEGATIVE Final    Comment: (NOTE) The Xpert Xpress SARS-CoV-2/FLU/RSV plus assay is intended as an aid in the diagnosis of influenza  from Nasopharyngeal swab specimens and should not be used as a sole basis for treatment. Nasal washings and aspirates are unacceptable for Xpert Xpress SARS-CoV-2/FLU/RSV testing.  Fact Sheet for Patients: BloggerCourse.com  Fact Sheet for Healthcare Providers: SeriousBroker.it  This test is not yet approved or cleared by the [place] FDA and has been authorized for detection and/or diagnosis of SARS-CoV-2 by FDA under an Emergency Use Authorization (EUA). This EUA will remain in effect (meaning this test can be used) for the duration of the COVID-19 declaration under Section 564(b)(1) of the Act, 21 U.S.C. section 360bbb-3(b)(1), unless the authorization is terminated or revoked.  Performed at [facility], [address], [place], [place] [address]        Today   Subjective    Codee Bloodworth today has no new complaints  Daughter [name] at bedside, No fever  Or chills   No Nausea, Vomiting or Diarrhea         Patient has been seen and examined prior to discharge   Objective   Blood pressure (!) 106/55, pulse 98, temperature 98.9 F (37.2 C), temperature source Oral, resp. rate 18, height 5\' 4"  (1.626 m), weight 47.1 kg, SpO2 100 %.   Intake/Output Summary (Last 24 hours) at 02/06/2021 1058 Last data filed at 02/06/2021 0535 Gross per 24 hour  Intake --  Output 650 ml  Net -650 ml    Exam Gen:- Awake Alert, chronically ill-appearing HEENT:- [place].AT, No sclera icterus Neck-Supple Neck,No JVD,.  Lungs-  CTAB , fair air movement CV- S1, S2 normal, RRR Abd-  +ve B.Sounds, Abd Soft, No tenderness, GJ tube site clean dry intact    Extremity/Skin:- No  edema,   good pedal pulses Psych-affect is flat, oriented x3 Neuro-generalized weakness, no new focal deficits, no tremors   Data Review   CBC w Diff:  Lab Results  Component Value Date   WBC 10.4 02/06/2021   HGB 8.2 (L) 02/06/2021   HCT 26.4 (L) 02/06/2021    PLT 301 02/06/2021   LYMPHOPCT 6 02/02/2021   MONOPCT 3 02/02/2021   EOSPCT 0 02/02/2021   BASOPCT 0 02/02/2021    CMP:  Lab Results  Component Value Date   NA 127 (L) 02/06/2021   K 4.5 02/06/2021   CL 98 02/06/2021   CO2 24 02/06/2021   BUN 12 02/06/2021   CREATININE 0.41 (L) 02/06/2021   PROT 5.7 (L) 02/03/2021   ALBUMIN 1.8 (L) 02/03/2021   BILITOT 0.4 02/03/2021   ALKPHOS 152 (H) 02/03/2021   AST 36 02/03/2021   ALT 46 (H) 02/03/2021  .   Total Discharge time is about 33 minutes  04/05/2021 M.D on 02/06/2021 at 10:58 AM  Go to [email or web address] -  for contact info  [facility] - Office  [phone]

## 2021-02-17 ENCOUNTER — Ambulatory Visit: Payer: Medicare Other | Admitting: Internal Medicine

## 2021-02-19 ENCOUNTER — Other Ambulatory Visit: Payer: Self-pay

## 2021-02-19 ENCOUNTER — Ambulatory Visit (INDEPENDENT_AMBULATORY_CARE_PROVIDER_SITE_OTHER): Payer: Medicare Other | Admitting: General Surgery

## 2021-02-19 VITALS — BP 99/63 | HR 87 | Temp 97.9°F | Resp 12 | Ht 64.0 in | Wt 103.0 lb

## 2021-02-19 DIAGNOSIS — Z934 Other artificial openings of gastrointestinal tract status: Secondary | ICD-10-CM | POA: Insufficient documentation

## 2021-02-19 NOTE — Patient Instructions (Signed)
Tube feeds per nutrition recommendations via Jejunal port, and medications through the gastric port. Need to flush the jejunal port every 4 hours with 20 cc of water. After tube feeds are stopped or held, need to flush with 20 cc of coke and 20 cc of water.  When I saw the Jejunal portion of the tube, there was tube feeds caked inside, and this will cause the blockage.  You need to make sure all of the feeds are fully flushed through without any residual in the jejunal lumen.   Steri strips to remain in place for 5-7 days. Ok to remove once peeling. Ok to shower, bathe with strips in place.

## 2021-02-19 NOTE — Progress Notes (Signed)
[facility] Note   HPI:  80 y.o. Female presents to clinic for post-op follow-up evaluation of gastrostomy converted to GJ by IR. Patient reports tolerating feeds when it is not clogged. She is still losing weight.   Review of Systems:  Tolerating feeds via J tube  All other review of systems: otherwise negative   Vital Signs:  BP 99/63   Pulse 87   Temp 97.9 F (36.6 C) (Other (Comment))   Resp 12   Ht 5\' 4"  (1.626 m)   Wt 103 lb (46.7 kg)   SpO2 96%   BMI 17.68 kg/m    Physical Exam:  Physical Exam Vitals reviewed.  Cardiovascular:     Rate and Rhythm: Normal rate.  Pulmonary:     Effort: Pulmonary effort is normal.  Abdominal:     General: There is no distension.     Palpations: Abdomen is soft.     Tenderness: There is no abdominal tenderness.     Comments: GJ in place, J lumen with feeds, flushed and evacuated feeds from the lumen with saline, staples removed, no erythema or drainage, steri strips placed   Neurological:     Mental Status: She is alert.     Assessment:  80 y.o. yo Female with GJ in place in the setting of achalasia, presumed gastroparesis (unable to tolerate gastric feeds), and possible distal motility issues but tolerating J tube feeds per families report. .  Plan:  Tube feeds per nutrition recommendations via Jejunal port, and medications through the gastric port. Need to flush the jejunal port every 4 hours with 20 cc of water. After tube feeds are stopped or held, need to flush with 20 cc of coke and 20 cc of water.  When I saw the Jejunal portion of the tube, there was tube feeds caked inside, and this will cause the blockage.  You need to make sure all of the feeds are fully flushed through without any residual in the jejunal lumen.   Steri strips to remain in place for 5-7 days. Ok to remove once peeling. Ok to shower, bathe with strips in place.   PRN Follow up.   All of the above recommendations were discussed with  the patient and patient's family, and all of patient's and family's questions were answered to heir expressed satisfaction.  94, MD [facility] [address] [place], [place] [phone] (office)

## 2021-02-22 NOTE — Progress Notes (Signed)
Office Visit Note  Patient: Kathryn [name]             Date of Birth: 17-Nov-1940           MRN: [id number]             PCP: Leeanne Rio, MD Referring: [name], MD Visit Date: 02/23/2021   Subjective:  New Patient (Initial Visit) (Patient (accompanied by daughter) complains of overall joint and muscle pain and weakness. Patient was recently hospitalized then discharged to a rehab facility [facility] in [place], [place]). Patient is currently in a wheelchair due to pain and weakness.)   History of Present Illness: Kathryn [name] is a 80 y.o. female here for evaluation of joint pains and weakness with concern for possible PMR also for nontraumatic rhabdomyolysis.  Her medical history significant for esophageal dysphagia with previous stricture formation, type 2 diabetes, and chronic anemia with some a plasma cell dyscrasia but no prior diagnosis of chronic inflammatory disease. Her recent medical course has been complicated by dehydration and limited nutrition with reliance on gastrostomy tube.  No specific inciting cause was identified for the initial hospitalization and weakness with CK elevation since the original hospitalization, although subsequently was readmitted with complications particularly with repeated obstruction of gastrostomy tubing interfering with medication and nutrition.  She is experienced 29 pound total weight loss since documented weight at hospital admission in April of this year.  She continues to feel overall weak symptoms are actually partially improved compared to a few months ago but still much worse than her previous baseline before this entire process started about 5 months ago.  Initial onset was bilateral with involvement of upper and lower extremities first came to additional attention from her decreased stability and speed with walking and then became completely unable to transfer unaided.  Joint pain initially in shoulders and hips leading to initial concern  of PMR.  She has had previous shoulder pains related to rotator cuff arthropathy on the right and possibly left but these have not usually limit her activity to this extent. She received right shoulder steroid injection in May with orthopedic surgery clinic which helped somewhat locally.  Labs reviewed 09/2020 ANA pos SSA > 8.0 dsDNA, RNP, SM, Scl-70, SSB, chromatin, Jo-1, centromere neg RF neg ESR 44 CK 1,206   Activities of Daily Living:  Patient reports morning stiffness for 24 hours.   Patient Reports nocturnal pain.  Difficulty dressing/grooming: Reports Difficulty climbing stairs: Reports Difficulty getting out of chair: Reports Difficulty using hands for taps, buttons, cutlery, and/or writing: Reports  Review of Systems  Constitutional:  Positive for fatigue.  HENT:  Positive for mouth dryness and nose dryness. Negative for mouth sores.   Eyes:  Positive for dryness. Negative for pain, itching and visual disturbance.  Respiratory:  Positive for cough. Negative for hemoptysis, shortness of breath and difficulty breathing.   Cardiovascular:  Negative for chest pain, palpitations and swelling in legs/feet.  Gastrointestinal:  Negative for abdominal pain, blood in stool, constipation and diarrhea.  Endocrine: Negative for increased urination.  Genitourinary:  Negative for painful urination.  Musculoskeletal:  Positive for joint pain, joint pain, myalgias, muscle weakness, morning stiffness, muscle tenderness and myalgias. Negative for joint swelling.  Skin:  Negative for color change, rash and redness.  Allergic/Immunologic: Negative for susceptible to infections.  Neurological:  Positive for light-headedness. Negative for dizziness, numbness, headaches, memory loss and weakness.  Hematological:  Negative for swollen glands.  Psychiatric/Behavioral:  Positive for  sleep disturbance. Negative for confusion.    PMFS History:  Patient Active Problem List   Diagnosis Date Noted    PEG tube malfunction ([place]) 03/02/2021   Protein-calorie malnutrition, severe 03/02/2021   Type 2 diabetes mellitus ([place]) 03/01/2021   Chronic hyponatremia 03/01/2021   Encounter for gastrojejunal (GJ) tube placement 03/01/2021   Positive ANA (antinuclear antibody) 02/23/2021   Hx SBO 02/23/2021   Gastrojejunostomy tube status ([place]) 02/19/2021   Pressure injury of skin 01/28/2021   Constipation    Dislodged gastrostomy tube    Hypoactive bowel sounds    Abdominal pain 01/14/2021   Generalized weakness 01/14/2021   Dehydration 01/14/2021   Normocytic anemia 01/14/2021   Leukocytosis 01/14/2021   Hyperglycemia 01/14/2021   Hypoalbuminemia due to protein-calorie malnutrition (HCC) 01/14/2021   Prolonged QT interval 01/14/2021   Malnutrition of moderate degree 01/14/2021   Achalasia    Nausea and vomiting 01/13/2021   Colitis 12/25/2020   COVID-19 virus infection 12/25/2020   Loss of weight    Dysphagia    Early satiety    UTI (urinary tract infection) 10/23/2020   Elevated d-dimer 10/23/2020   Tachycardia 10/23/2020   Abnormal computed tomography of esophagus 10/23/2020   Mild protein-calorie malnutrition ([place]) 10/23/2020   Rhabdomyolysis 10/23/2020   Hypertension associated with diabetes ([place]) 10/23/2020   Sensorineural hearing loss (SNHL), bilateral 03/02/2016   Family history of colon cancer 04/27/2015   Family history of thyroid cancer 04/27/2015   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    HTN (hypertension)     Family History  Problem Relation Age of Onset   Colon cancer Father        diagnosed in his late 54s.   COPD Son    Esophageal cancer Neg Hx    Stomach cancer Neg Hx     Past Surgical History:  Procedure Laterality Date   ABDOMINAL HYSTERECTOMY     BOTOX INJECTION N/A 01/15/2021   Procedure: BOTOX INJECTION;  Surgeon: Eloise Harman, DO;  [facility];  Service: Endoscopy;  Laterality: N/A;   BOWEL RESECTION N/A 01/22/2021   Procedure: SMALL BOWEL RESECTION;  Surgeon: Virl Cagey, MD;  Location: [facility];  Service: General;  Laterality: N/A;   CHOLECYSTECTOMY     ESOPHAGEAL DILATION N/A 10/24/2020   Procedure: ESOPHAGEAL DILATION;  Surgeon: Daneil Dolin, MD;  Location: [facility];  Service: Endoscopy;  Laterality: N/A;   ESOPHAGOGASTRODUODENOSCOPY N/A 10/24/2020   Procedure: ESOPHAGOGASTRODUODENOSCOPY (EGD);  Surgeon: Daneil Dolin, MD;  [facility];  Service: Endoscopy;  Laterality: N/A;   ESOPHAGOGASTRODUODENOSCOPY (EGD) WITH PROPOFOL N/A 01/15/2021   Procedure: ESOPHAGOGASTRODUODENOSCOPY (EGD) WITH PROPOFOL;  Surgeon: Eloise Harman, DO;  [facility];  Service: Endoscopy;  Laterality: N/A;   GASTROSTOMY N/A 01/23/2021   Procedure: REINSERTION OF GASTROSTOMY TUBE;  Surgeon: Virl Cagey, MD;  Location: [facility];  Service: General;  Laterality: N/A;   GASTROSTOMY N/A 01/22/2021   Procedure: INSERTION OF GASTROSTOMY TUBE;  Surgeon: Virl Cagey, MD;  Location: [facility];  Service: General;  Laterality: N/A;   Histosalpingogram     IR GASTR TUBE CONVERT GASTR-JEJ PER W/FL MOD  SED  02/02/2021   LYSIS OF ADHESION  01/22/2021   Procedure: LYSIS OF ADHESION;  Surgeon: Virl Cagey, MD;  Location: [facility];  Service: General;;   Social History   Social History Narrative   Not on file   Immunization History  Administered Date(s) Administered   Influenza Inj Mdck Quad Pf 05/26/2020   Influenza,inj,Quad PF,6+ Mos 04/19/2017, 04/12/2018, 05/31/2019   Influenza-Unspecified 04/07/2015   Moderna Sars-Covid-2 Vaccination 08/08/2019, 09/05/2019, 07/21/2020   Pneumococcal Conjugate-13 04/01/2014    Pneumococcal Polysaccharide-23 06/13/2006   Tetanus 07/25/1996     Objective: Vital Signs: BP 99/65 (BP Location: Left Arm, Patient Position: Sitting, Cuff Size: Normal)   Pulse 80   Ht _0  (1.626 m) Comment: Patient stated  Wt 102 lb (46.3 kg) Comment: Patient stated  BMI 17.51 kg/m    Physical Exam Constitutional:      Appearance: She is ill-appearing.  Eyes:     Conjunctiva/sclera: Conjunctivae normal.  Cardiovascular:     Rate and Rhythm: Normal rate and regular rhythm.  Pulmonary:     Effort: Pulmonary effort is normal.     Breath sounds: Normal breath sounds.  Skin:    General: Skin is warm and dry.     Findings: No rash.  Neurological:     Mental Status: She is alert.     Comments: Generalized weakness, strength very low 2/5 in shoulder abduction 3/5 hip flexion distal strength better 4/5 b/l  Psychiatric:        Mood and Affect: Mood normal.     Musculoskeletal Exam:  Shoulders ROM limited bilaterally cannot raise overhead unaided minimal tenderness to palpation around joint Elbows full ROM no tenderness or swelling Wrists full ROM no tenderness or swelling Fingers full ROM no tenderness, heberdon's nodes present no synovitis Knees full ROM no tenderness or swelling, patellofemoral crepitus present Ankles full ROM no tenderness or swelling  Investigation: No additional findings.  Imaging: IR GASTR TUBE CONVERT GASTR-JEJ PER W/FL MOD SED  Result Date: 02/02/2021 INDICATION: 80 year old female referred for conversion of surgical gastrostomy to a gastrojejunostomy Additional history of continuous leakage at the gastrostomy site. The patient had a surgical gastrostomy placed, 24 [place], which was displaced by the patient. A repeat surgery with replacement of a 20 French gastrostomy, balloon retention, was performed. EXAM: CONVERT G-TUBE TO G-JTUBE MEDICATIONS: None ANESTHESIA/SEDATION: Versed 1.0 mg IV; Fentanyl 50 mcg IV Moderate Sedation Time:  45 minutes The  patient was continuously monitored during the procedure by the interventional radiology nurse under my direct supervision. CONTRAST:  20 cc FLUOROSCOPY TIME:  Fluoroscopy Time: 22 minutes 30 seconds (101 mGy). COMPLICATIONS: None PROCEDURE: Informed written consent was obtained from the patient and the patient's family after a thorough discussion of the procedural risks, benefits and alternatives. All questions were addressed. Maximal Sterile Barrier Technique was utilized including caps, mask, sterile gowns, sterile gloves, sterile drape, hand hygiene and skin antiseptic. A timeout was performed prior to the initiation of the procedure. The epigastrium including the indwelling gastrostomy was prepped with Betadine in a sterile fashion, and a sterile drape was applied covering the operative field. A sterile gown and sterile gloves were used for the procedure. Contrast was infused to confirm location within the stomach. The configuration of the contrast in the stomach suggest that this gastrostomy is in the distal stomach, beyond the incisura, and near the pylorus/pyloric channel. A 65 cm Kumpe the catheter was advanced through the gastrostomy. A Glidewire was used to probe for the pylorus,  unsuccessful. A Cobra catheter was then used with a Glidewire to probe for the pylorus/duodenum. This was unsuccessful. Kumpe the catheter was then used with a standard Glidewire, with a circuitous route through the cardia of the stomach, looped back to the body/pyloric channel, and ultimately through the duodenum. This redundant route allow the catheter wire combination to go below the balloon on the G-tube. This redundant catheter in wire configuration was under favorable, and ultimately could not be reduced for a more direct route through the pyloric channel. At this point we placed a tandem Bentson wire into the stomach lumen, cut the retention sutures at the skin, and deflated the balloon, partly withdrawing on the  gastrostomy. Kumpe the catheter and the Cobra catheter were used to probe for the pylorus, unsuccessful. Ultimately, a 4 French omni Flush catheter was needed to direct the standard Glidewire through the pyloric channel and into the duodenum. The Omni Flush catheter was then advanced on the Glidewire. With a Glidewire in the duodenum, the 4 [place] omni Flush catheter was withdrawn and the Kumpe the catheter was advanced on the Glidewire to the proximal jejunum. Wire was withdrawn and contrast was infused confirming the location. A 22 French gastrojejunostomy was selected. The final 15 cm was amputated. The tube was then advanced on the Glidewire after withdrawal of the Bentson wire. Approximately 5 cc of mixed contrast and saline were used to inflate the balloon. Contrast was then injected confirming location within the stomach. Patient tolerated the procedure well and remained hemodynamically stable throughout. No complications were encountered and no significant blood loss encountered. IMPRESSION: Exchange of surgical gastrostomy for a new gastrojejunostomy, as above. The site of access for the surgical gastrostomy may not be favorable for [place]-term effectiveness. Signed, Dulcy Fanny. Earleen Newport, DO [facility] Radiology Electronically Signed   By: Corrie Mckusick D.O.   On: 02/02/2021 15:54    Recent Labs: Lab Results  Component Value Date   WBC 4.9 03/02/2021   HGB 9.8 (L) 03/02/2021   PLT 370 03/02/2021   NA 127 (L) 03/02/2021   K 4.0 03/02/2021   CL 98 03/02/2021   CO2 22 03/02/2021   GLUCOSE 104 (H) 03/02/2021   BUN <5 (L) 03/02/2021   CREATININE 0.34 (L) 03/02/2021   BILITOT 0.4 02/03/2021   ALKPHOS 152 (H) 02/03/2021   AST 36 02/03/2021   ALT 46 (H) 02/03/2021   PROT 5.7 (L) 02/03/2021   ALBUMIN 1.8 (L) 02/03/2021   CALCIUM 8.8 (L) 03/02/2021    Speciality Comments: No specialty comments available.  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  Her very acute onset within about 2 weeks is unusual for primary autoimmune disorder is more commonly seen with a secondary condition though seems unusual she did not have specific periods of prolonged immobility to explain rhabdomyolysis process.  Positive ANA and SSA also seen in some conditions such as dermatomyositis we will check antibody profile also repeating CK and aldolase and sedimentation rate markers.  She is apparently had some steroid responsiveness with generalized features although would be concerned with an exacerbation of multiple problems also infection risks.  Does have history of QT prolongation is on multiple meds also with abnormal blood  sugars and meds may not be ideal candidate for antimalarials..  Achalasia  Has pre-existing history with some esophageal dysmotility and stricture formation though never to the recent extent and becoming gastrostomy tube For nutrition.  Esophageal dysmotility could be seen from underlying systemic connective tissue disease though acute exacerbation is unusual.  Hypoalbuminemia due to protein-calorie malnutrition (HCC) Loss of weight  Severe weight loss multiple metabolic abnormalities appear to be mostly related to p.o. intolerance and inadequate nutrition along with acute deconditioning with multiple hospitalizations.  No current findings on repeat imaging or labs so far suggesting underlying malignancy.  Orders: Orders Placed This Encounter  Procedures   Myositis Assessr Plus Jo-1 Autoabs   CK   Aldolase   Sedimentation rate   ANA   Anti-nuclear ab-titer (ANA titer)    No orders of the  defined types were placed in this encounter.   Follow-Up Instructions: Return in about 2 weeks (around 03/09/2021) for New pt f/u.   Collier Salina, MD  Note - This record has been created using [employer].  Chart creation errors have been sought, but may not always  have been located. Such creation errors do not reflect on  the standard of medical care.

## 2021-02-23 ENCOUNTER — Other Ambulatory Visit: Payer: Self-pay

## 2021-02-23 ENCOUNTER — Encounter: Payer: Self-pay | Admitting: Internal Medicine

## 2021-02-23 ENCOUNTER — Ambulatory Visit (INDEPENDENT_AMBULATORY_CARE_PROVIDER_SITE_OTHER): Payer: Medicare Other | Admitting: Internal Medicine

## 2021-02-23 ENCOUNTER — Ambulatory Visit: Payer: Medicare Other | Admitting: Internal Medicine

## 2021-02-23 VITALS — BP 99/65 | HR 80 | Ht 64.0 in | Wt 102.0 lb

## 2021-02-23 DIAGNOSIS — K220 Achalasia of cardia: Secondary | ICD-10-CM | POA: Diagnosis not present

## 2021-02-23 DIAGNOSIS — R768 Other specified abnormal immunological findings in serum: Secondary | ICD-10-CM | POA: Diagnosis not present

## 2021-02-23 DIAGNOSIS — M6282 Rhabdomyolysis: Secondary | ICD-10-CM | POA: Diagnosis not present

## 2021-02-23 DIAGNOSIS — Z8719 Personal history of other diseases of the digestive system: Secondary | ICD-10-CM | POA: Insufficient documentation

## 2021-02-23 DIAGNOSIS — R634 Abnormal weight loss: Secondary | ICD-10-CM

## 2021-02-23 DIAGNOSIS — E46 Unspecified protein-calorie malnutrition: Secondary | ICD-10-CM

## 2021-02-23 DIAGNOSIS — E8809 Other disorders of plasma-protein metabolism, not elsewhere classified: Secondary | ICD-10-CM

## 2021-02-26 ENCOUNTER — Ambulatory Visit: Payer: Medicare Other | Admitting: Internal Medicine

## 2021-03-01 ENCOUNTER — Inpatient Hospital Stay (HOSPITAL_COMMUNITY)
Admission: AD | Admit: 2021-03-01 | Discharge: 2021-03-07 | DRG: 393 | Disposition: A | Discharge status: Skilled Nursing Facility | Payer: Medicare Other | Source: Other Acute Inpatient Hospital | Attending: Family Medicine | Admitting: Family Medicine

## 2021-03-01 DIAGNOSIS — Z90710 Acquired absence of both cervix and uterus: Secondary | ICD-10-CM

## 2021-03-01 DIAGNOSIS — Z934 Other artificial openings of gastrointestinal tract status: Secondary | ICD-10-CM | POA: Diagnosis not present

## 2021-03-01 DIAGNOSIS — E871 Hypo-osmolality and hyponatremia: Secondary | ICD-10-CM | POA: Diagnosis not present

## 2021-03-01 DIAGNOSIS — Z8616 Personal history of COVID-19: Secondary | ICD-10-CM

## 2021-03-01 DIAGNOSIS — Z79899 Other long term (current) drug therapy: Secondary | ICD-10-CM | POA: Diagnosis not present

## 2021-03-01 DIAGNOSIS — Z825 Family history of asthma and other chronic lower respiratory diseases: Secondary | ICD-10-CM

## 2021-03-01 DIAGNOSIS — Z9049 Acquired absence of other specified parts of digestive tract: Secondary | ICD-10-CM | POA: Diagnosis not present

## 2021-03-01 DIAGNOSIS — Z794 Long term (current) use of insulin: Secondary | ICD-10-CM | POA: Diagnosis not present

## 2021-03-01 DIAGNOSIS — R1314 Dysphagia, pharyngoesophageal phase: Secondary | ICD-10-CM | POA: Diagnosis not present

## 2021-03-01 DIAGNOSIS — Z66 Do not resuscitate: Secondary | ICD-10-CM | POA: Diagnosis present

## 2021-03-01 DIAGNOSIS — I152 Hypertension secondary to endocrine disorders: Secondary | ICD-10-CM | POA: Diagnosis present

## 2021-03-01 DIAGNOSIS — T819XXA Unspecified complication of procedure, initial encounter: Secondary | ICD-10-CM | POA: Diagnosis present

## 2021-03-01 DIAGNOSIS — K9423 Gastrostomy malfunction: Principal | ICD-10-CM | POA: Diagnosis present

## 2021-03-01 DIAGNOSIS — Z888 Allergy status to other drugs, medicaments and biological substances status: Secondary | ICD-10-CM

## 2021-03-01 DIAGNOSIS — E119 Type 2 diabetes mellitus without complications: Secondary | ICD-10-CM

## 2021-03-01 DIAGNOSIS — K219 Gastro-esophageal reflux disease without esophagitis: Secondary | ICD-10-CM | POA: Diagnosis present

## 2021-03-01 DIAGNOSIS — Y838 Other surgical procedures as the cause of abnormal reaction of the patient, or of later complication, without mention of misadventure at the time of the procedure: Secondary | ICD-10-CM | POA: Diagnosis not present

## 2021-03-01 DIAGNOSIS — M3500 Sicca syndrome, unspecified: Secondary | ICD-10-CM | POA: Diagnosis not present

## 2021-03-01 DIAGNOSIS — Z800 Family history of malignant neoplasm of digestive organs: Secondary | ICD-10-CM

## 2021-03-01 DIAGNOSIS — E1169 Type 2 diabetes mellitus with other specified complication: Secondary | ICD-10-CM | POA: Diagnosis present

## 2021-03-01 DIAGNOSIS — Z20822 Contact with and (suspected) exposure to covid-19: Secondary | ICD-10-CM | POA: Diagnosis not present

## 2021-03-01 DIAGNOSIS — E43 Unspecified severe protein-calorie malnutrition: Secondary | ICD-10-CM | POA: Diagnosis not present

## 2021-03-01 DIAGNOSIS — Z789 Other specified health status: Secondary | ICD-10-CM | POA: Insufficient documentation

## 2021-03-01 DIAGNOSIS — M329 Systemic lupus erythematosus, unspecified: Secondary | ICD-10-CM | POA: Diagnosis present

## 2021-03-01 DIAGNOSIS — Z681 Body mass index (BMI) 19 or less, adult: Secondary | ICD-10-CM

## 2021-03-01 DIAGNOSIS — E1159 Type 2 diabetes mellitus with other circulatory complications: Secondary | ICD-10-CM | POA: Diagnosis present

## 2021-03-01 DIAGNOSIS — R059 Cough, unspecified: Secondary | ICD-10-CM

## 2021-03-01 DIAGNOSIS — D638 Anemia in other chronic diseases classified elsewhere: Secondary | ICD-10-CM | POA: Diagnosis present

## 2021-03-01 DIAGNOSIS — K220 Achalasia of cardia: Secondary | ICD-10-CM | POA: Diagnosis not present

## 2021-03-01 LAB — BASIC METABOLIC PANEL
Anion gap: 4 — ABNORMAL LOW (ref 5–15)
BUN: 6 mg/dL — ABNORMAL LOW (ref 8–23)
CO2: 24 mmol/L (ref 22–32)
Calcium: 8.7 mg/dL — ABNORMAL LOW (ref 8.9–10.3)
Chloride: 99 mmol/L (ref 98–111)
Creatinine, Ser: 0.39 mg/dL — ABNORMAL LOW (ref 0.44–1.00)
GFR, Estimated: >60 mL/min (ref >60)
Glucose, Bld: 98 mg/dL (ref 70–99)
Potassium: 4.4 mmol/L (ref 3.5–5.1)
Sodium: 127 mmol/L — ABNORMAL LOW (ref 135–145)

## 2021-03-01 LAB — GLUCOSE, CAPILLARY: Glucose-Capillary: 90 mg/dL (ref 70–99)

## 2021-03-01 MED ORDER — ACETAMINOPHEN 650 MG RE SUPP: 650.0000 mg | Freq: Four times a day (QID) | RECTAL | Status: DC | PRN

## 2021-03-01 MED ORDER — SCOPOLAMINE 1 MG/3DAYS TD PT72
1.0000 | MEDICATED_PATCH | TRANSDERMAL | Status: DC
  Administered 2021-03-02 – 2021-03-04 (×2): 1.5 mg via TRANSDERMAL
  Filled 2021-03-01 (×4): qty 1

## 2021-03-01 MED ORDER — LACTATED RINGERS IV SOLN: INTRAVENOUS | Status: AC

## 2021-03-01 MED ORDER — ONDANSETRON HCL 4 MG/2ML IJ SOLN: 4.0000 mg | Freq: Four times a day (QID) | INTRAMUSCULAR | Status: DC | PRN

## 2021-03-01 MED ORDER — ACETAMINOPHEN 325 MG PO TABS: 650.0000 mg | ORAL_TABLET | Freq: Four times a day (QID) | ORAL | Status: DC | PRN

## 2021-03-01 MED ORDER — ONDANSETRON HCL 4 MG PO TABS: 4.0000 mg | ORAL_TABLET | Freq: Four times a day (QID) | ORAL | Status: DC | PRN

## 2021-03-01 NOTE — H&P (Signed)
History and Physical    Kathryn [name] AJO:[id number] DOB: [birth year] DOA: 03/01/2021  PCP: Leeanne Rio, MD  Patient coming from: SNF via [facility] ED  I have personally briefly reviewed patient's old medical records in [place]  Chief Complaint: PEG tube malfunction  HPI: Kathryn [name] is a 80 y.o. female with medical history significant for esophageal dysphagia/achalasia s/p G/J-tube, chronic hyponatremia, type 2 diabetes, hypertension, anemia, positive SSA and ANA (for which Sjogren's syndrome and SLE are on differential) who presented to [facility] ED for evaluation of GJ tube dysfunction.  Patient recently admitted 01/13/2021-02/06/2021 for epigastric/abdominal pain with nausea and vomiting in the setting of esophageal dysphagia.  She underwent G-tube placement on 7/28 with subsequent dislodgment, replacement, and finally GJ tube conversion by IR on 8/8.  She was seen in the ED 8/21 for blocked feeding tube which was cleaned and flushed easily. She saw general surgery in follow-up on 8/25.  Tube was clogged on exam and subsequently flushed with evacuation of feeds from the lumen with saline.  Patient was advised to flush the jejunal port every 4 hours and after feeds stopped or held.  Patient returned to the ED from SNF due to persistent clogging of the GJ tube.  Per daughter, she has not been able to receive any feeds last 24 hours.  Patient is having some mild abdominal discomfort around the G-tube insertion site and excessive oral secretions but denies any significant nausea, vomiting.  She denies any chest pain, dyspnea, dysuria.  [facility] course:  Vitals showed BP 92/63, pulse 93, RR 16, temp 98.4 F, SPO2 94% on room air.  Labs showed WBC 6.2, hemoglobin 9.6, platelets 313,000, sodium 122.  Abdominal x-ray showed gastrojejunostomy catheter likely partially withdrawn with the retaining balloon inflated within the tract of the abdominal wall.  Gastric  lumen and jejunal lumen appropriately positioned.  Normal abdominal gas pattern.  Transfer was requested to [facility] for IR consultative services.  Review of Systems: All systems reviewed and are negative except as documented in history of present illness above.   Past Medical History:  Diagnosis Date   Acid reflux    Diabetes ([place])    controlled with diet.    HTN (hypertension)     Past Surgical History:  Procedure Laterality Date   ABDOMINAL HYSTERECTOMY     BOTOX INJECTION N/A 01/15/2021   Procedure: BOTOX INJECTION;  Surgeon: Eloise Harman, DO;  Location: [facility] ENDO SU[facility];  Service: Endoscopy;  Laterality: N/A;   BOWEL RESECTION N/A 01/22/2021   Procedure: SMALL BOWEL RESECTION;  Surgeon: Virl Cagey, MD;  Location: [facility] ORS;  Service: General;  Laterality: N/A;   CHOLECYSTECTOMY     ESOPHAGEAL DILATION N/A 10/24/2020   Procedure: ESOPHAGEAL DILATION;  Surgeon: Daneil Dolin, MD;  Location: [facility] ENDO SUITE;  Service: Endoscopy;  Laterality: N/A;   ESOPHAGOGASTRODUODENOSCOPY N/A 10/24/2020   Procedure: ESOPHAGOGASTRODUODENOSCOPY (EGD);  Surgeon: Daneil Dolin, MD;  Location: [facility];  Service: Endoscopy;  Laterality: N/A;   ESOPHAGOGASTRODUODENOSCOPY (EGD) WITH PROPOFOL N/A 01/15/2021   Procedure: ESOPHAGOGASTRODUODENOSCOPY (EGD) WITH PROPOFOL;  Surgeon: Eloise Harman, DO;  Location: [facility];  Service: Endoscopy;  Laterality: N/A;   GASTROSTOMY N/A 01/23/2021   Procedure: REINSERTION OF GASTROSTOMY TUBE;  Surgeon: Virl Cagey, MD;  Location: [facility] ORS;  Service: General;  Laterality: N/A;   GASTROSTOMY N/A 01/22/2021   Procedure: INSERTION OF GASTROSTOMY TUBE;  Surgeon: Virl Cagey, MD;  Location: [facility] ORS;  Service: General;  Laterality: N/A;   Histosalpingogram     IR GASTR TUBE CONVERT GASTR-JEJ PER W/FL MOD SED  02/02/2021   LYSIS OF ADHESION  01/22/2021   Procedure: LYSIS OF ADHESION;  Surgeon: Virl Cagey, MD;  Location: [facility] ORS;   Service: General;;    Social History:  reports that she has never smoked. She has never used smokeless tobacco. She reports that she does not drink alcohol and does not use drugs.  Allergies  Allergen Reactions   Cyclobenzaprine Hcl Swelling    Family History  Problem Relation Age of Onset   Colon cancer Father        diagnosed in his late 48s.   COPD Son    Esophageal cancer Neg Hx    Stomach cancer Neg Hx      Prior to Admission medications   Medication Sig Start Date End Date Taking? Authorizing Provider  acetaminophen (TYLENOL) 325 MG tablet Place 2 tablets (650 mg total) into feeding tube every 6 (six) hours as needed for mild pain, fever or headache. 02/06/21   Emokpae, Courage, MD  GOODSENSE ASPIRIN LOW DOSE 81 MG EC tablet Take 81 mg by mouth daily. 02/06/21   [provider]  HYDROcodone-acetaminophen (HYCET) 7.5-325 mg/15 ml solution Take 10 mLs by mouth every 12 (twelve) hours as needed for moderate pain or severe pain. Patient not taking: Reported on 02/23/2021 02/06/21 02/06/22  Roxan Hockey, MD  insulin lispro (HUMALOG KWIKPEN) 100 UNIT/ML KwikPen Inject 0-10 Units into the skin 3 (three) times daily. insulin Humalog injection 0-10 Units 0-10 Units Subcutaneous, 3 times daily with meals CBG < 70: Implement Hypoglycemia Standing Orders and refer to Hypoglycemia Standing Orders sidebar report  CBG 70 - 120: 0 unit CBG 121 - 150: 0 unit  CBG 151 - 200: 1 unit CBG 201 - 250: 2 units CBG 251 - 300: 4 units CBG 301 - 350: 6 units  CBG 351 - 400: 8 units  CBG > 400: 10 units 02/06/21   Emokpae, Courage, MD  lansoprazole (PREVACID) 3 mg/ml SUSP oral suspension Place 5 mLs (15 mg total) into feeding tube daily at 12 noon. Patient not taking: Reported on 02/23/2021 02/06/21   Roxan Hockey, MD  metoprolol tartrate (LOPRESSOR) 25 MG tablet Take 0.5 tablets (12.5 mg total) by mouth 2 (two) times daily. 02/06/21   Roxan Hockey, MD  Nutritional Supplements (FEEDING  SUPPLEMENT, OSMOLITE 1.2 CAL,) LIQD Place 1,000 mLs into feeding tube continuous. 02/06/21   Roxan Hockey, MD  scopolamine (TRANSDERM-SCOP) 1 MG/3DAYS Place 1 patch (1.5 mg total) onto the skin every 3 (three) days. 02/06/21   Roxan Hockey, MD  triamcinolone ointment (KENALOG) 0.1 % Apply topically. 09/26/20   [provider]  Water For Irrigation, Sterile (FREE WATER) SOLN Place 100 mLs into feeding tube 3 (three) times daily. 02/06/21   Roxan Hockey, MD    Physical Exam: Vitals:   03/01/21 1900  Temp: 98.1 F (36.7 C)   Constitutional: Thin elderly woman resting in bed with head elevated, NAD, calm, comfortable Eyes: PERRL, lids and conjunctivae normal ENMT: Mucous membranes are dry. Posterior pharynx clear of any exudate or lesions.Normal dentition.  Neck: normal, supple, no masses. Respiratory: clear to auscultation bilaterally, no wheezing, no crackles. Normal respiratory effort. No accessory muscle use.  Cardiovascular: Regular rate and rhythm, no murmurs / rubs / gallops. No extremity edema. 2+ pedal pulses. Abdomen: GJ tube in place with residual feed in J tube.  Mild LUQ and LLQ tenderness to palpation. Musculoskeletal: no clubbing / cyanosis. No joint deformity upper and lower extremities. Good ROM, no contractures. Normal muscle tone.  Skin: no rashes, lesions, ulcers. No induration Neurologic: CN 2-12 grossly intact. Sensation intact. Strength 5/5 in all 4.  Psychiatric: Normal judgment and insight. Alert and oriented x 3. Normal mood.     Labs on Admission: I have personally reviewed following labs and imaging studies  CBC: No results for input(s): WBC, NEUTROABS, HGB, HCT, MCV, PLT in the last 168 hours. Basic Metabolic Panel: No results for input(s): NA, K, CL, CO2, GLUCOSE, BUN, CREATININE, CALCIUM, MG, PHOS in the last 168 hours. GFR: CrCl cannot be calculated (Patient's most recent lab result is older than the maximum 21 days allowed.). Liver  Function Tests: No results for input(s): AST, ALT, ALKPHOS, BILITOT, PROT, ALBUMIN in the last 168 hours. No results for input(s): LIPASE, AMYLASE in the last 168 hours. No results for input(s): AMMONIA in the last 168 hours. Coagulation Profile: No results for input(s): INR, PROTIME in the last 168 hours. Cardiac Enzymes: Recent Labs  Lab 02/23/21 1405  CKTOTAL 76   BNP (last 3 results) No results for input(s): PROBNP in the last 8760 hours. HbA1C: No results for input(s): HGBA1C in the last 72 hours. CBG: No results for input(s): GLUCAP in the last 168 hours. Lipid Profile: No results for input(s): CHOL, HDL, LDLCALC, TRIG, CHOLHDL, LDLDIRECT in the last 72 hours. Thyroid Function Tests: No results for input(s): TSH, T4TOTAL, FREET4, T3FREE, THYROIDAB in the last 72 hours. Anemia Panel: No results for input(s): VITAMINB12, FOLATE, FERRITIN, TIBC, IRON, RETICCTPCT in the last 72 hours. Urine analysis:    Component Value Date/Time   COLORURINE AMBER (A) 01/13/2021 2309   APPEARANCEUR CLOUDY (A) 01/13/2021 2309   LABSPEC 1.025 01/13/2021 2309   PHURINE 5.0 01/13/2021 2309   GLUCOSEU NEGATIVE 01/13/2021 2309   HGBUR SMALL (A) 01/13/2021 2309   BILIRUBINUR NEGATIVE 01/13/2021 2309   KETONESUR 20 (A) 01/13/2021 2309   PROTEINUR 100 (A) 01/13/2021 2309   NITRITE POSITIVE (A) 01/13/2021 2309   LEUKOCYTESUR MODERATE (A) 01/13/2021 2309    Radiological Exams on Admission: No results found.  EKG: Not performed.  Assessment/Plan Principal Problem:   Gastrojejunostomy tube status (HCC) Active Problems:   Hypertension associated with diabetes ([place])   Type 2 diabetes mellitus (HCC)   Chronic hyponatremia   Kathryn [name] is a 80 y.o. female with medical history significant for esophageal dysphagia/achalasia s/p G/J-tube, chronic hyponatremia, type 2 diabetes, hypertension, anemia, positive SSA and ANA (for which Sjogren's syndrome and SLE around differential) who is admitted  for [place] tube evaluation.  Esophageal dysphagia/achalasia s/p GJ tube with concern for tube dysfunction/placement: GJ tube placed by IR 02/02/2021.  Patient having repeat issues of clogging, now sent from [facility] ED due to concern for displacement.  She is having some mild discomfort around tube insertion site. -Keep n.p.o. with aspiration precautions -Hold tube feeds for now -Consult IR for tube evaluation -Start LR @75  mL/hour overnight while holding feeds  Chronic hyponatremia: Sodium 117 on 9/2 and 122 on 9/4 prior to arrival.  Recent baseline appears to be around 130. -Repeat BMP now and monitor trend  Type 2 diabetes: A1c 5.8% on 12/26/2020.  Hold insulin for now since she is not receiving tube feeds.  Monitor CBG checks.  Hypertension: Holding metoprolol for now.  Excessive secretions: Continue scopolamine patch.  Anemia of chronic disease: Stable, continue monitor.  Positive SSA  and ANA: Differential including Sjogren's syndrome and SLE.  Seen by rheumatology, Dr. Benjamine Mola on 02/23/2021. Repeat labs next visit showed positive ANA with ANA titer 1:320 and homogenous and speckled pattern.  ESR 86, aldolase 3.6, CK 76.  Continue to follow with rheumatology outpatient.   DVT prophylaxis: SCDs Code Status: DNR, confirmed on admission Family Communication: Discussed with patient's daughter at bedside Disposition Plan: From SNF and likely return to SNF pending [place] tube evaluation Consults called: IR Level of [name]:   MedSurg Admission status:  Status is: Observation  The patient remains OBS appropriate and will d/c before 2 midnights.  Dispo: The patient is from: SNF              Anticipated d/c is to: SNF              Patient currently is not medically stable to d/c.   Zada Finders MD [facility]  If 7PM-7AM, please contact night-coverage [email or web address]  03/01/2021, 7:22 PM

## 2021-03-02 ENCOUNTER — Other Ambulatory Visit: Payer: Self-pay

## 2021-03-02 ENCOUNTER — Encounter (HOSPITAL_COMMUNITY): Payer: Self-pay | Admitting: Internal Medicine

## 2021-03-02 DIAGNOSIS — Z934 Other artificial openings of gastrointestinal tract status: Secondary | ICD-10-CM | POA: Diagnosis not present

## 2021-03-02 DIAGNOSIS — K9423 Gastrostomy malfunction: Secondary | ICD-10-CM | POA: Diagnosis present

## 2021-03-02 DIAGNOSIS — Z794 Long term (current) use of insulin: Secondary | ICD-10-CM | POA: Diagnosis not present

## 2021-03-02 DIAGNOSIS — I152 Hypertension secondary to endocrine disorders: Secondary | ICD-10-CM | POA: Diagnosis present

## 2021-03-02 DIAGNOSIS — K220 Achalasia of cardia: Secondary | ICD-10-CM | POA: Diagnosis present

## 2021-03-02 DIAGNOSIS — M329 Systemic lupus erythematosus, unspecified: Secondary | ICD-10-CM | POA: Diagnosis present

## 2021-03-02 DIAGNOSIS — Z66 Do not resuscitate: Secondary | ICD-10-CM | POA: Diagnosis present

## 2021-03-02 DIAGNOSIS — Y838 Other surgical procedures as the cause of abnormal reaction of the patient, or of later complication, without mention of misadventure at the time of the procedure: Secondary | ICD-10-CM | POA: Diagnosis present

## 2021-03-02 DIAGNOSIS — Z8616 Personal history of COVID-19: Secondary | ICD-10-CM | POA: Diagnosis not present

## 2021-03-02 DIAGNOSIS — D638 Anemia in other chronic diseases classified elsewhere: Secondary | ICD-10-CM | POA: Diagnosis present

## 2021-03-02 DIAGNOSIS — Z681 Body mass index (BMI) 19 or less, adult: Secondary | ICD-10-CM | POA: Diagnosis not present

## 2021-03-02 DIAGNOSIS — M3500 Sicca syndrome, unspecified: Secondary | ICD-10-CM | POA: Diagnosis present

## 2021-03-02 DIAGNOSIS — E1169 Type 2 diabetes mellitus with other specified complication: Secondary | ICD-10-CM | POA: Diagnosis present

## 2021-03-02 DIAGNOSIS — Z800 Family history of malignant neoplasm of digestive organs: Secondary | ICD-10-CM | POA: Diagnosis not present

## 2021-03-02 DIAGNOSIS — K219 Gastro-esophageal reflux disease without esophagitis: Secondary | ICD-10-CM | POA: Diagnosis present

## 2021-03-02 DIAGNOSIS — R1314 Dysphagia, pharyngoesophageal phase: Secondary | ICD-10-CM | POA: Diagnosis present

## 2021-03-02 DIAGNOSIS — E43 Unspecified severe protein-calorie malnutrition: Secondary | ICD-10-CM | POA: Diagnosis present

## 2021-03-02 DIAGNOSIS — E871 Hypo-osmolality and hyponatremia: Secondary | ICD-10-CM | POA: Diagnosis present

## 2021-03-02 DIAGNOSIS — Z79899 Other long term (current) drug therapy: Secondary | ICD-10-CM | POA: Diagnosis not present

## 2021-03-02 DIAGNOSIS — Z90710 Acquired absence of both cervix and uterus: Secondary | ICD-10-CM | POA: Diagnosis not present

## 2021-03-02 DIAGNOSIS — Z20822 Contact with and (suspected) exposure to covid-19: Secondary | ICD-10-CM | POA: Diagnosis present

## 2021-03-02 DIAGNOSIS — T819XXA Unspecified complication of procedure, initial encounter: Secondary | ICD-10-CM | POA: Diagnosis present

## 2021-03-02 DIAGNOSIS — Z888 Allergy status to other drugs, medicaments and biological substances status: Secondary | ICD-10-CM | POA: Diagnosis not present

## 2021-03-02 DIAGNOSIS — Z9049 Acquired absence of other specified parts of digestive tract: Secondary | ICD-10-CM | POA: Diagnosis not present

## 2021-03-02 DIAGNOSIS — Z825 Family history of asthma and other chronic lower respiratory diseases: Secondary | ICD-10-CM | POA: Diagnosis not present

## 2021-03-02 LAB — GLUCOSE, CAPILLARY
Glucose-Capillary: 88 mg/dL (ref 70–99)
Glucose-Capillary: 77 mg/dL (ref 70–99)
Glucose-Capillary: 78 mg/dL (ref 70–99)
Glucose-Capillary: 93 mg/dL (ref 70–99)

## 2021-03-02 LAB — CBC
HCT: 30 % — ABNORMAL LOW (ref 36.0–46.0)
Hemoglobin: 9.8 g/dL — ABNORMAL LOW (ref 12.0–15.0)
MCH: 31 pg (ref 26.0–34.0)
MCHC: 32.7 g/dL (ref 30.0–36.0)
MCV: 94.9 fL (ref 80.0–100.0)
Platelets: 370 10*3/uL (ref 150–400)
RBC: 3.16 MIL/uL — ABNORMAL LOW (ref 3.87–5.11)
RDW: 15.5 % (ref 11.5–15.5)
WBC: 4.9 10*3/uL (ref 4.0–10.5)
nRBC: 0 % (ref 0.0–0.2)

## 2021-03-02 LAB — BASIC METABOLIC PANEL
Anion gap: 7 (ref 5–15)
BUN: <5 mg/dL — ABNORMAL LOW (ref 8–23)
CO2: 22 mmol/L (ref 22–32)
Calcium: 8.8 mg/dL — ABNORMAL LOW (ref 8.9–10.3)
Chloride: 98 mmol/L (ref 98–111)
Creatinine, Ser: 0.34 mg/dL — ABNORMAL LOW (ref 0.44–1.00)
GFR, Estimated: >60 mL/min (ref >60)
Glucose, Bld: 104 mg/dL — ABNORMAL HIGH (ref 70–99)
Potassium: 4 mmol/L (ref 3.5–5.1)
Sodium: 127 mmol/L — ABNORMAL LOW (ref 135–145)

## 2021-03-02 LAB — SARS CORONAVIRUS 2 (TAT 6-24 HRS): SARS Coronavirus 2: POSITIVE — AB

## 2021-03-02 MED ORDER — DEXTROSE-NACL 5-0.9 % IV SOLN: INTRAVENOUS | Status: DC

## 2021-03-02 MED ORDER — LACTATED RINGERS IV SOLN: INTRAVENOUS | Status: DC

## 2021-03-02 NOTE — Plan of Care (Signed)
  Problem: Education: Goal: Knowledge of General Education information will improve Description: Including pain rating scale, medication(s)/side effects and non-pharmacologic comfort measures Outcome: Progressing   Problem: Clinical Measurements: Goal: Will remain free from infection Outcome: Progressing   Problem: Activity: Goal: Risk for activity intolerance will decrease Outcome: Progressing   Problem: Nutrition: Goal: Adequate nutrition will be maintained Outcome: Progressing   Problem: Elimination: Goal: Will not experience complications related to bowel motility Outcome: Progressing   

## 2021-03-02 NOTE — Progress Notes (Signed)
Initial Nutrition Assessment  DOCUMENTATION CODES:   Severe malnutrition in context of chronic illness  INTERVENTION:   -Once tube is replaced:   Initiate Osmolite 1.2 @ 20 ml/hr and increase by 10 ml every 4 hours to goal rate of 60 ml/hr.   45 ml Prosource TF daily.  120 ml free water flush every 6 hours    Tube feeding regimen provides 1728 kcal (100% of needs), 91 grams of protein, and 1181 ml of H2O.  Total free water: 1901 ml daily  NUTRITION DIAGNOSIS:   Severe Malnutrition related to chronic illness (achalasia) as evidenced by moderate fat depletion, severe fat depletion, moderate muscle depletion, severe muscle depletion, percent weight loss.  GOAL:   Patient will meet greater than or equal to 90% of their needs  MONITOR:   Labs, Weight trends, Skin, I & O's  REASON FOR ASSESSMENT:   Malnutrition Screening Tool    ASSESSMENT:   Kathryn [name] is a 80 y.o. female with medical history significant for esophageal dysphagia/achalasia s/p G/J-tube, chronic hyponatremia, type 2 diabetes, hypertension, anemia, positive SSA and ANA (for which Sjogren's syndrome and SLE around differential) who is admitted for GJ tube evaluation.  Pt admitted with concern for G-J tube dysfunction/ placement.   Reviewed I/O's: +83 ml x 24 hours  Pt with history of esophageal dysphagia/ achalasia and is dependent on G-J tube.   Spoke with pt at bedside, who confirms that she does not take anything PO. She confirms presence of G-J tube and was tolerating TF PTA. She is unsure of her home regimen or the last time she was administered TF. She is from SNF.   Per pt, her UBW is around 150#. She estimates she has lost about 45# over the past 4-5 months. Reviewed wt hx; pt has experienced a 18.3% wt loss over the past 2 months, which is significant for time frame.   Pt awaiting IR consult for G-J tube replacement.   Medications reviewed and include lactated ringers infusion @ 75 ml/hr.    Labs reviewed: Na: 127. CBGS: 88-90.  NUTRITION - FOCUSED PHYSICAL EXAM:  Flowsheet Row Most Recent Value  Orbital Region Moderate depletion  Upper Arm Region Severe depletion  Thoracic and Lumbar Region Severe depletion  Buccal Region Moderate depletion  Temple Region Severe depletion  Clavicle Bone Region Severe depletion  Clavicle and Acromion Bone Region Severe depletion  Scapular Bone Region Severe depletion  Dorsal Hand Moderate depletion  Patellar Region Moderate depletion  Anterior Thigh Region Moderate depletion  Posterior Calf Region Moderate depletion  Edema (RD Assessment) Mild  Hair Reviewed  Eyes Reviewed  Mouth Reviewed  Skin Reviewed  Nails Reviewed       Diet Order:   Diet Order             Diet NPO time specified  Diet effective now                   EDUCATION NEEDS:   Education needs have been addressed  Skin:  Skin Assessment: Skin Integrity Issues: Skin Integrity Issues:: Other (Comment) Other: upper knot on forehead, MASD to perineum  Last BM:  Unknown  Height:   Ht Readings from Last 1 Encounters:  03/01/21 5\' 4"  (1.626 m)    Weight:   Wt Readings from Last 1 Encounters:  02/23/21 46.3 kg    Ideal Body Weight:  54.5 kg  BMI:  Body mass index is 17.51 kg/m.  Estimated Nutritional Needs:   Kcal:  0737-1062  Protein:  90-105 grams  Fluid:  > 1.6 L    Levada Schilling, RD, LDN, CDCES Registered Dietitian II Certified Diabetes Care and Education Specialist Please refer to [facility] for RD and/or RD on-call/weekend/after hours pager

## 2021-03-02 NOTE — Progress Notes (Signed)
PROGRESS NOTE    Kathryn [name]  MOQ:[id number] DOB: [birth year] DOA: 03/01/2021 PCP: Leeanne Rio, MD    Brief Narrative:  Kathryn [name] is a 80 y.o. female with medical history significant for esophageal dysphagia/achalasia s/p G/J-tube, chronic hyponatremia, type 2 diabetes, hypertension, anemia, positive SSA and ANA (for which Sjogren's syndrome and SLE are on differential) who presented to [facility] ED for evaluation of GJ tube dysfunction.   Patient recently admitted 01/13/2021-02/06/2021 for epigastric/abdominal pain with nausea and vomiting in the setting of esophageal dysphagia.  She underwent G-tube placement on 7/28 with subsequent dislodgment, replacement, and finally GJ tube conversion by IR on 8/8.  She was seen in the ED 8/21 for blocked feeding tube which was cleaned and flushed easily. She saw general surgery in follow-up on 8/25.  Tube was clogged on exam and subsequently flushed with evacuation of feeds from the lumen with saline.  Patient was advised to flush the jejunal port every 4 hours and after feeds stopped or held.   Patient returned to the ED from SNF due to persistent clogging of the GJ tube.  Per daughter, she has not been able to receive any feeds last 24 hours.  Patient is having some mild abdominal discomfort around the G-tube insertion site and excessive oral secretions but denies any significant nausea, vomiting.  She denies any chest pain, dyspnea, dysuria.   [facility] course:  BP 92/63, pulse 93, RR 16, temp 98.4 F, SPO2 94% on room air. Labs showed WBC 6.2, hemoglobin 9.6, platelets 313,000, sodium 122. Abdominal x-ray showed gastrojejunostomy catheter likely partially withdrawn with the retaining balloon inflated within the tract of the abdominal wall.  Gastric lumen and jejunal lumen appropriately positioned.  Normal abdominal gas pattern. Transfer was requested to [facility] for IR consultative services.   Assessment & Plan:    Principal Problem:   Gastrojejunostomy tube status ([place]) Active Problems:   Hypertension associated with diabetes ([place])   Type 2 diabetes mellitus ([place])   Chronic hyponatremia   Esophageal dysphagia/achalasia s/p GJ tube with concern for tube dysfunction/placement: GJ tube placed by IR 02/02/2021.  Patient having repeat issues of clogging, now sent from [facility] ED due to concern for displacement.  Abdominal x-ray at [facility] with gastrojejunostomy catheter likely partially withdrawn within the retaining balloon inflated within the track of the abdominal wall.  She is having some mild discomfort around tube insertion site. --Consulted for further evaluation and replacement --N.p.o., hold tube feeds for now --Continue LR at 75 mL/hour    Chronic hyponatremia: Sodium 117 on 9/2 and 122 on 9/4 prior to arrival.  Recent baseline appears to be around 130. --Na 127 --Continue LR IVF as above --BMP daily --Once GJ tube malfunction resolved, will resume tube feeds   Type 2 diabetes: A1c 5.8% on 12/26/2020.  On insulin sliding scale outpatient. --Holding insulin for now since NPO and not receiving tube feeds.   --CBG q4h.   Hypertension: BP 110/60 this morning. --Continue to hold metoprolol 74m PO BID for now. -- Holding aspirin --continue to monitor BP closely   Excessive secretions: Continue scopolamine patch.   Anemia of chronic disease: Stable, continue monitor.   Positive SSA and ANA: Differential including Sjogren's syndrome and SLE.  Seen by rheumatology, Dr. RBenjamine Molaon 02/23/2021. Repeat labs next visit showed positive ANA with ANA titer 1:320 and homogenous and speckled pattern.  ESR 86, aldolase 3.6, CK 76.  Continue to follow with rheumatology outpatient, Dr RBenjamine Mola  DVT prophylaxis: SCDs Start: 03/01/21 2004   Code Status: DNR Family Communication: No family present at bedside this morning.  Disposition Plan:  Level of care: Med-Surg Status is:  Inpatient  Remains inpatient appropriate because:Ongoing diagnostic testing needed not appropriate for outpatient work up, Unsafe d/c plan, IV treatments appropriate due to intensity of illness or inability to take PO, and Inpatient level of care appropriate due to severity of illness  Dispo: The patient is from: SNF              Anticipated d/c is to: SNF              Patient currently is not medically stable to d/c.   Difficult to place patient No   Consultants:  Interventional radiology  Procedures:  None  Antimicrobials:  None   Subjective: Patient seen examined bedside, resting comfortably.  No specific complaints this morning.  Awaiting for IR evaluation due to GJ tube dysfunction/dislodgment.  No other further questions or concerns at this time.  Denies headache, no fever/chills/night sweats, no nausea/vomiting/diarrhea, no chest pain, no palpitations, no shortness of breath, no abdominal pain, no weakness, no fatigue, no paresthesias.  No acute events overnight per nurse staff.  Objective: Vitals:   03/01/21 1900 03/02/21 0539 03/02/21 0716  BP:  110/60 (!) 148/69  Pulse:  (!) 101 (!) 101  Resp:  20   Temp: 98.1 F (36.7 C) (!) 97.5 F (36.4 C) (!) 97.5 F (36.4 C)  TempSrc:   Oral  SpO2:  100% 100%  Height: 5' 4" (1.626 m)      Intake/Output Summary (Last 24 hours) at 03/02/2021 1326 Last data filed at 03/02/2021 0300 Gross per 24 hour  Intake 82.5 ml  Output --  Net 82.5 ml   There were no vitals filed for this visit.  Examination:  General exam: Appears calm and comfortable, elderly in appearance Respiratory system: Clear to auscultation. Respiratory effort normal.  On room air Cardiovascular system: S1 & S2 heard, RRR. No JVD, murmurs, rubs, gallops or clicks. No pedal edema. Gastrointestinal system: Abdomen is nondistended, soft and nontender. No organomegaly or masses felt. Normal bowel sounds heard.  GJ tube noted. Central nervous system: Alert and  oriented. No focal neurological deficits. Extremities: Symmetric 5 x 5 power. Skin: No rashes, lesions or ulcers Psychiatry: Judgement and insight appear normal. Mood & affect appropriate.     Data Reviewed: I have personally reviewed following labs and imaging studies  CBC: Recent Labs  Lab 03/02/21 0122  WBC 4.9  HGB 9.8*  HCT 30.0*  MCV 94.9  PLT 370   Basic Metabolic Panel: Recent Labs  Lab 03/01/21 2058 03/02/21 0122  NA 127* 127*  K 4.4 4.0  CL 99 98  CO2 24 22  GLUCOSE 98 104*  BUN 6* <5*  CREATININE 0.39* 0.34*  CALCIUM 8.7* 8.8*   GFR: Estimated Creatinine Clearance: 40.3 mL/min (A) (by C-G formula based on SCr of 0.34 mg/dL (L)). Liver Function Tests: No results for input(s): AST, ALT, ALKPHOS, BILITOT, PROT, ALBUMIN in the last 168 hours. No results for input(s): LIPASE, AMYLASE in the last 168 hours. No results for input(s): AMMONIA in the last 168 hours. Coagulation Profile: No results for input(s): INR, PROTIME in the last 168 hours. Cardiac Enzymes: Recent Labs  Lab 02/23/21 1405  CKTOTAL 76   BNP (last 3 results) No results for input(s): PROBNP in the last 8760 hours. HbA1C: No results for input(s): HGBA1C in the last 72   hours. CBG: Recent Labs  Lab 03/01/21 2127 03/02/21 0617  GLUCAP 90 88   Lipid Profile: No results for input(s): CHOL, HDL, LDLCALC, TRIG, CHOLHDL, LDLDIRECT in the last 72 hours. Thyroid Function Tests: No results for input(s): TSH, T4TOTAL, FREET4, T3FREE, THYROIDAB in the last 72 hours. Anemia Panel: No results for input(s): VITAMINB12, FOLATE, FERRITIN, TIBC, IRON, RETICCTPCT in the last 72 hours. Sepsis Labs: No results for input(s): PROCALCITON, LATICACIDVEN in the last 168 hours.  Recent Results (from the past 240 hour(s))  SARS CORONAVIRUS 2 (TAT 6-24 HRS) Nasopharyngeal Nasopharyngeal Swab     Status: Abnormal   Collection Time: 03/02/21  2:12 AM   Specimen: Nasopharyngeal Swab  Result Value Ref Range  Status   SARS Coronavirus 2 POSITIVE (A) NEGATIVE Final    Comment: (NOTE) SARS-CoV-2 target nucleic acids are DETECTED.  The SARS-CoV-2 RNA is generally detectable in upper and lower respiratory specimens during the acute phase of infection. Positive results are indicative of the presence of SARS-CoV-2 RNA. Clinical correlation with patient history and other diagnostic information is  necessary to determine patient infection status. Positive results do not rule out bacterial infection or co-infection with other viruses.  The expected result is Negative.  Fact Sheet for Patients: SugarRoll.be  Fact Sheet for Healthcare Providers: https://www.woods-mathews.com/  This test is not yet approved or cleared by the [place] FDA and  has been authorized for detection and/or diagnosis of SARS-CoV-2 by FDA under an Emergency Use Authorization (EUA). This EUA will remain  in effect (meaning this test can be used) for the duration of the COVID-19 declaration under Section 564(b)(1) of the Act, 21 U. S.C. section 360bbb-3(b)(1), unless the authorization is terminated or revoked sooner.   Performed at [facility], [place] [address], [place], [place] [address]          Radiology Studies: No results found.      Scheduled Meds:  scopolamine  1 patch Transdermal Q72H   Continuous Infusions:  lactated ringers       LOS: 0 days    Time spent: 42 minutes spent on chart review, discussion with nursing staff, consultants, updating family and interview/physical exam; more than 50% of that time was spent in counseling and/or coordination of care.    Kathryn [name] J [place] DO [facility] Available via Epic secure chat 7am-7pm After these hours, please refer to coverage provider listed on [email or web address] 03/02/2021, 1:26 PM

## 2021-03-02 NOTE — TOC Progression Note (Signed)
Transition of Care [facility]) - Progression Note    Patient Details  Name: SHAWNELLE SPOERL MRN: [id number] Date of Birth: 1941-03-10  Transition [facility]) CM/SW Contact  Ralene Bathe, LCSWA Phone Number: 03/02/2021, 9:11 AM  Clinical Narrative:     09:11-  CSW reviewed the chart and read that the patient is from [facility].  CSW called [facility] to inquire about what would be needed for the patient to the facility once medically ready. CSW called twice and there was no answer either time.  CSW was unable to leave a VM.    09:14-  CSW called the patient's daughter to inquire about d/c planning when the patient is medically ready.  The daughter informed CSW that the patient is from [facility] SNF.  09:18-  CSW called Lesly Dukes with admissions at [facility].  There was no answer.  CSW left a VM requesting a returned call.         Expected Discharge Plan and Services                                                 Social Determinants of Health (SDOH) Interventions    Readmission Risk Interventions Readmission Risk Prevention Plan 02/03/2021  Transportation Screening Complete  PCP or Specialist Appt within 3-5 Days Complete  HRI or Home Care Consult Complete  Social Work Consult for Recovery Care Planning/Counseling Complete  Palliative Care Screening Not Applicable  Medication Review Ocean[employer]) Complete  Some recent data might be hidden

## 2021-03-02 NOTE — Plan of Care (Addendum)
Notified Dr. [place] about Jejunal site not flushing, patient CBG trending down, CBG 88@0617 , 77@1417 . Continuous D5 NS @75ml /hr ordered and running. Will continue to monitor patient.   Patient insertion site of gtube draining, dressing covered in bright green drainage, redressed area and will continue to monitor.  Problem: Education: Goal: Knowledge of General Education information will improve Description: Including pain rating scale, medication(s)/side effects and non-pharmacologic comfort measures Outcome: Progressing   Problem: Activity: Goal: Risk for activity intolerance will decrease Outcome: Progressing   Problem: Pain Managment: Goal: General experience of comfort will improve Outcome: Progressing   Problem: Safety: Goal: Ability to remain free from injury will improve Outcome: Progressing   Problem: Skin Integrity: Goal: Risk for impaired skin integrity will decrease Outcome: Progressing

## 2021-03-02 NOTE — Evaluation (Signed)
Physical Therapy Evaluation Patient Details Name: Kathryn [name] MRN: [id number] DOB: Sep 15, 1940 Today's Date: 03/02/2021   History of Present Illness  Pt is an 80 y/o female admitted from SNF on 9/4 secondary to GJ tube malfunction. PMH includes DM, HTN, dysphagia/achalasia s/p GJ tube.  Clinical Impression  Pt admitted secondary to problem above with deficits below. Pt requiring min A for bed mobility tasks this session. Performed supine HEP during session as well. Per notes, pt from SNF and recommend return to SNF at d/c. Will continue to follow acutely.     Follow Up Recommendations SNF;Supervision/Assistance - 24 hour    Equipment Recommendations  None recommended by PT    Recommendations for Other Services       Precautions / Restrictions Precautions Precautions: Fall Precaution Comments: G tube Restrictions Weight Bearing Restrictions: No      Mobility  Bed Mobility Overal bed mobility: Needs Assistance Bed Mobility: Rolling Rolling: Min assist         General bed mobility comments: Min A for rolling side to side for clean up following BM. RN present in room to assist. Positioned pt on R with pillows under L buttock for pressure relief.    Transfers                    Ambulation/Gait                Stairs            Wheelchair Mobility    Modified Rankin (Stroke Patients Only)       Balance                                             Pertinent Vitals/Pain Pain Assessment: No/denies pain    Home Living Family/patient expects to be discharged to:: Skilled nursing facility                      Prior Function Level of Independence: Needs assistance   Gait / Transfers Assistance Needed: Uses RW to ambulate short distances with assistance. Otherwise uses WC.  ADL's / Homemaking Assistance Needed: Staff assists with bathing/dressing        Hand Dominance        Extremity/Trunk Assessment    Upper Extremity Assessment Upper Extremity Assessment: Defer to OT evaluation    Lower Extremity Assessment Lower Extremity Assessment: Generalized weakness (required assist to perform heel slides)    Cervical / Trunk Assessment Cervical / Trunk Assessment: Kyphotic  Communication   Communication: No difficulties  Cognition Arousal/Alertness: Awake/alert Behavior During Therapy: WFL for tasks assessed/performed Overall Cognitive Status: No family/caregiver present to determine baseline cognitive functioning                                 General Comments: A and O X4. Somewhat slow processing      General Comments      Exercises General Exercises - Lower Extremity Ankle Circles/Pumps: AROM;Both;10 reps Quad Sets: AROM;Both;5 reps;Supine Heel Slides: AAROM;Both;5 reps   Assessment/Plan    PT Assessment Patient needs continued PT services  PT Problem List Decreased strength;Decreased activity tolerance;Decreased balance;Decreased mobility;Decreased knowledge of use of DME;Decreased knowledge of precautions       PT Treatment Interventions DME instruction;Gait training;Therapeutic activities;Functional mobility training;Balance training;Therapeutic  exercise;Patient/family education;Cognitive remediation    PT Goals (Current goals can be found in the Care Plan section)  Acute Rehab PT Goals Patient Stated Goal: to get stronger PT Goal Formulation: With patient Time For Goal Achievement: 03/16/21 Potential to Achieve Goals: Good    Frequency Min 2X/week   Barriers to discharge        Co-evaluation               AM-PAC PT "6 Clicks" Mobility  Outcome Measure Help needed turning from your back to your side while in a flat bed without using bedrails?: A Little Help needed moving from lying on your back to sitting on the side of a flat bed without using bedrails?: A Little Help needed moving to and from a bed to a chair (including a wheelchair)?: A  Lot Help needed standing up from a chair using your arms (e.g., wheelchair or bedside chair)?: A Lot Help needed to walk in hospital room?: A Lot Help needed climbing 3-5 steps with a railing? : Total 6 Click Score: 13    End of Session   Activity Tolerance: Patient tolerated treatment well Patient left: in bed;with call bell/phone within reach;with bed alarm set Nurse Communication: Mobility status PT Visit Diagnosis: Other abnormalities of gait and mobility (R26.89);Muscle weakness (generalized) (M62.81);Difficulty in walking, not elsewhere classified (R26.2)    Time: 2595-6387 PT Time Calculation (min) (ACUTE ONLY): 26 min   Charges:   PT Evaluation $PT Eval Moderate Complexity: 1 Mod PT Treatments $Therapeutic Activity: 8-22 mins        Cindee Salt, DPT  Acute Rehabilitation Services  Pager: [phone] Office: [phone]   Lehman Prom 03/02/2021, 1:12 PM

## 2021-03-02 NOTE — Progress Notes (Signed)
Occupational Therapy Evaluation Patient Details Name: Kathryn [name] MRN: [id number] DOB: 06-Mar-1941 Today's Date: 03/02/2021    History of Present Illness Pt is an 80 y/o female admitted from SNF on 9/4 secondary to GJ tube malfunction. PMH includes DM, HTN, dysphagia/achalasia s/p GJ tube.   Clinical Impression   Kathryn [name] was evaluated s/p the above admission list. PTA she was residing at [facility], per her and her daughter's report pt has not walked since late May 2022, she is max A for all transfers, spends most of her time in bed, and is max A for sponge bathing/ADLs at bed level. Upon eval pt was max A for all bed level ADLs and min A for bed mobility. Her goal is to get up and walk again. Pt will benefit from continued OT acutely. Recommend d/c back to SNF.    Follow Up Recommendations  SNF;Supervision/Assistance - 24 hour    Equipment Recommendations  None recommended by OT       Precautions / Restrictions Precautions Precautions: Fall (pt fell out of bed about 3 weeks ago) Precaution Comments: G tube Restrictions Weight Bearing Restrictions: No      Mobility Bed Mobility Overal bed mobility: Needs Assistance Bed Mobility: Rolling Rolling: Min assist         General bed mobility comments: Min A for rolling side to side for clean up following BM. RN present in room to assist. Positioned pt on R with pillows under L buttock for pressure relief.    Transfers                 General transfer comment: pt declined this session    Balance Overall balance assessment: History of Falls;Needs assistance             ADL either performed or assessed with clinical judgement   ADL Overall ADL's : Needs assistance/impaired Eating/Feeding: NPO   Grooming: Set up;Bed level Grooming Details (indicate cue type and reason): supported in chair position in bed Upper Body Bathing: Moderate assistance;Bed level   Lower Body Bathing: Maximal assistance;Bed level   Upper Body  Dressing : Moderate assistance;Sitting   Lower Body Dressing: Maximal assistance;Bed level   Toilet Transfer: Maximal assistance;Squat-pivot;BSC   Toileting- Clothing Manipulation and Hygiene: Total assistance;Bed level       Functional mobility during ADLs: Maximal assistance (stand/squat pivots only, pt is tiny enough to dependently transfer +1 however will likely need +2 for gait progression) General ADL Comments: Pt is be level for most ADLs at the time due to poor sitting balance and fatigue, she was Max A for ADLs prior to admission      Pertinent Vitals/Pain Pain Assessment: No/denies pain     Hand Dominance Right   Extremity/Trunk Assessment Upper Extremity Assessment Upper Extremity Assessment: LUE deficits/detail;Generalized weakness LUE Deficits / Details: Pt reports she has a rotator cuff tear, she had a cortisone injection several months ago. No AROM, limited to about 30* AAROM, and 90* PROM. Elbow wrist and hand ROM is WFL, +generalized weakness LUE Sensation: WNL LUE Coordination: decreased gross motor   Lower Extremity Assessment Lower Extremity Assessment: Defer to PT evaluation   Cervical / Trunk Assessment Cervical / Trunk Assessment: Kyphotic   Communication Communication Communication: No difficulties   Cognition Arousal/Alertness: Awake/alert Behavior During Therapy: WFL for tasks assessed/performed Overall Cognitive Status: Within Functional Limits for tasks assessed               General Comments: A&O x4, daughter present and reports  no change in cognition. Pt demonstrates slow processsing, likely personality/baseline   General Comments  pt with bump on the L side of her forehaed, daughter reports it is from a fall out of bed 3 weeks ago            Home Living Family/patient expects to be discharged to:: Skilled nursing facility            Prior Functioning/Environment Level of Independence: Needs assistance  Gait / Transfers  Assistance Needed: Daughter reports the staff at the facility dependently transfer the pt from her bed to the [facility], the pt sits in [facility] for a few hours each day. Pt has not walked since late May/Beginning of June 2022 ADL's / Homemaking Assistance Needed: Staff assists with bathing/dressing Communication / Swallowing Assistance Needed: G tube          OT Problem List: Decreased strength;Decreased range of motion;Decreased activity tolerance;Impaired balance (sitting and/or standing);Decreased safety awareness;Decreased knowledge of use of DME or AE;Decreased knowledge of precautions;Pain;Impaired UE functional use      OT Treatment/Interventions: Self-care/ADL training;Therapeutic exercise;DME and/or AE instruction;Patient/family education;Therapeutic activities;Balance training    OT Goals(Current goals can be found in the care plan section) Acute Rehab OT Goals Patient Stated Goal: to walk again OT Goal Formulation: With patient Time For Goal Achievement: 03/16/21 Potential to Achieve Goals: Fair ADL Goals Pt Will Perform Grooming: with set-up;sitting Pt Will Perform Upper Body Bathing: with set-up;sitting Pt Will Perform Lower Body Bathing: with min assist;sitting/lateral leans Pt Will Perform Upper Body Dressing: with set-up;sitting Pt Will Perform Lower Body Dressing: with mod assist;sit to/from stand Pt Will Transfer to Toilet: with min assist;stand pivot transfer;bedside commode Pt Will Perform Toileting - Clothing Manipulation and hygiene: with min assist;sitting/lateral leans Pt/caregiver will Perform Home Exercise Program: Increased ROM;Increased strength;Both right and left upper extremity;With written HEP provided  OT Frequency: Min 2X/week    AM-PAC OT "6 Clicks" Daily Activity     Outcome Measure Help from another person eating meals?: Total Help from another person taking care of personal grooming?: A Lot Help from another person toileting, which includes using toliet,  bedpan, or urinal?: A Lot Help from another person bathing (including washing, rinsing, drying)?: A Lot Help from another person to put on and taking off regular upper body clothing?: A Lot Help from another person to put on and taking off regular lower body clothing?: A Lot 6 Click Score: 11   End of Session Nurse Communication: Mobility status;Precautions;Weight bearing status  Activity Tolerance: Patient tolerated treatment well;Patient limited by fatigue Patient left: in bed;with call bell/phone within reach;with bed alarm set;with family/visitor present  OT Visit Diagnosis: Unsteadiness on feet (R26.81);Other abnormalities of gait and mobility (R26.89);History of falling (Z91.81);Muscle weakness (generalized) (M62.81);Pain                Time: 5093-2671 OT Time Calculation (min): 16 min Charges:  OT General Charges $OT Visit: 1 Visit OT Evaluation $OT Eval Moderate Complexity: 1 Mod    Ahlia Lemanski A Elza Varricchio 03/02/2021, 2:31 PM

## 2021-03-03 ENCOUNTER — Inpatient Hospital Stay (HOSPITAL_COMMUNITY): Payer: Medicare Other

## 2021-03-03 LAB — GLUCOSE, CAPILLARY
Glucose-Capillary: 111 mg/dL — ABNORMAL HIGH (ref 70–99)
Glucose-Capillary: 114 mg/dL — ABNORMAL HIGH (ref 70–99)
Glucose-Capillary: 121 mg/dL — ABNORMAL HIGH (ref 70–99)
Glucose-Capillary: 117 mg/dL — ABNORMAL HIGH (ref 70–99)
Glucose-Capillary: 93 mg/dL (ref 70–99)
Glucose-Capillary: 88 mg/dL (ref 70–99)

## 2021-03-03 LAB — BASIC METABOLIC PANEL
Anion gap: 5 (ref 5–15)
BUN: <5 mg/dL — ABNORMAL LOW (ref 8–23)
CO2: 25 mmol/L (ref 22–32)
Calcium: 8.6 mg/dL — ABNORMAL LOW (ref 8.9–10.3)
Chloride: 100 mmol/L (ref 98–111)
Creatinine, Ser: 0.41 mg/dL — ABNORMAL LOW (ref 0.44–1.00)
GFR, Estimated: >60 mL/min (ref >60)
Glucose, Bld: 108 mg/dL — ABNORMAL HIGH (ref 70–99)
Potassium: 4.1 mmol/L (ref 3.5–5.1)
Sodium: 130 mmol/L — ABNORMAL LOW (ref 135–145)

## 2021-03-03 MED ORDER — DIATRIZOATE MEGLUMINE & SODIUM 66-10 % PO SOLN
ORAL | Status: AC
  Administered 2021-03-03: 30 mL via GASTROSTOMY
  Filled 2021-03-03: qty 30

## 2021-03-03 NOTE — Progress Notes (Addendum)
PROGRESS NOTE    Kathryn [name]  YFV:[id number] DOB: Oct 29, 1940 DOA: 03/01/2021 PCP: Leeanne Rio, MD    Brief Narrative:  Kathryn [name] is a 80 y.o. female with medical history significant for esophageal dysphagia/achalasia s/p G/J-tube, chronic hyponatremia, type 2 diabetes, hypertension, anemia, positive SSA and ANA (for which Sjogren's syndrome and SLE are on differential) who presented to [facility] ED for evaluation of GJ tube dysfunction.   Patient recently admitted 01/13/2021-02/06/2021 for epigastric/abdominal pain with nausea and vomiting in the setting of esophageal dysphagia.  She underwent G-tube placement on 7/28 with subsequent dislodgment, replacement, and finally GJ tube conversion by IR on 8/8.  She was seen in the ED 8/21 for blocked feeding tube which was cleaned and flushed easily. She saw general surgery in follow-up on 8/25.  Tube was clogged on exam and subsequently flushed with evacuation of feeds from the lumen with saline.  Patient was advised to flush the jejunal port every 4 hours and after feeds stopped or held.   Patient returned to the ED from SNF due to persistent clogging of the GJ tube.  Per daughter, she has not been able to receive any feeds last 24 hours.  Patient is having some mild abdominal discomfort around the G-tube insertion site and excessive oral secretions but denies any significant nausea, vomiting.  She denies any chest pain, dyspnea, dysuria.   [facility] course:  BP 92/63, pulse 93, RR 16, temp 98.4 F, SPO2 94% on room air. Labs showed WBC 6.2, hemoglobin 9.6, platelets 313,000, sodium 122. Abdominal x-ray showed gastrojejunostomy catheter likely partially withdrawn with the retaining balloon inflated within the tract of the abdominal wall.  Gastric lumen and jejunal lumen appropriately positioned.  Normal abdominal gas pattern. Transfer was requested to [facility] for IR consultative services.   Assessment & Plan:    Principal Problem:   Gastrojejunostomy tube status ([place]) Active Problems:   Hypertension associated with diabetes ([place])   Type 2 diabetes mellitus ([place])   Chronic hyponatremia   PEG tube malfunction (HCC)   Protein-calorie malnutrition, severe   Esophageal dysphagia/achalasia s/p GJ tube with concern for tube dysfunction/placement: GJ tube placed by IR 02/02/2021.  Patient having repeat issues of clogging, now sent from [facility] ED due to concern for displacement.  Abdominal x-ray at [facility] with gastrojejunostomy catheter likely partially withdrawn within the retaining balloon inflated within the track of the abdominal wall.  She is having some mild discomfort around tube insertion site. --N.p.o., hold tube feeds for now --Continue D5NS at 75 mL/hour  --IR plans GJ tube exchange 9/7   Chronic hyponatremia: Sodium 117 on 9/2 and 122 on 9/4 prior to arrival.  Recent baseline appears to be around 130. --Na 127>130 --Continue LR IVF as above --BMP daily --Once GJ tube exchanged, nutrition consulted to resume tube feeds   Type 2 diabetes: A1c 5.8% on 12/26/2020.  On insulin sliding scale outpatient. --Holding insulin for now since NPO and not receiving tube feeds.   --CBG q4h.   Hypertension: BP 114/49 this morning. --Continue to hold metoprolol 79m PO BID for now. --Holding aspirin --continue to monitor BP closely   Excessive secretions: Continue scopolamine patch.   Anemia of chronic disease: Stable, continue monitor.   Positive SSA and ANA: Differential including Sjogren's syndrome and SLE.  Seen by rheumatology, Dr. RBenjamine Molaon 02/23/2021. Repeat labs next visit showed positive ANA with ANA titer 1:320 and homogenous and speckled pattern.  ESR 86, aldolase 3.6,  CK 76.  Continue to follow with rheumatology outpatient.   DVT prophylaxis: SCDs Start: 03/01/21 2004   Code Status: DNR Family Communication: No family present at bedside this morning.  Disposition Plan:   Level of care: Med-Surg Status is: Inpatient  Remains inpatient appropriate because:Ongoing diagnostic testing needed not appropriate for outpatient work up, Unsafe d/c plan, IV treatments appropriate due to intensity of illness or inability to take PO, and Inpatient level of care appropriate due to severity of illness  Dispo: The patient is from: SNF              Anticipated d/c is to: SNF              Patient currently is not medically stable to d/c.   Difficult to place patient No   Consultants:  Interventional radiology  Procedures:  Pending IR [place] tube exchange 9/7  Antimicrobials:  None   Subjective: Patient seen examined bedside, resting comfortably.  No specific complaints this morning.  Seen by IR today with planned GJ tube exchange tomorrow. No other further questions or concerns at this time.  Denies headache, no fever/chills/night sweats, no nausea/vomiting/diarrhea, no chest pain, no palpitations, no shortness of breath, no abdominal pain, no weakness, no fatigue, no paresthesias.  No acute events overnight per nurse staff.  Objective: Vitals:   03/02/21 0716 03/02/21 1516 03/02/21 2217 03/03/21 0730  BP: (!) 148/69 (!) 117/54 (!) 111/49 133/79  Pulse: (!) 101 93 72 91  Resp:  17 17 18   Temp: (!) 97.5 F (36.4 C) 98.7 F (37.1 C) 97.7 F (36.5 C) 98.4 F (36.9 C)  TempSrc: Oral Oral Oral Oral  SpO2: 100% 100% 100% 100%  Height:        Intake/Output Summary (Last 24 hours) at 03/03/2021 1244 Last data filed at 03/03/2021 0400 Gross per 24 hour  Intake 999.23 ml  Output --  Net 999.23 ml   There were no vitals filed for this visit.  Examination:  General exam: Appears calm and comfortable, elderly in appearance Respiratory system: Clear to auscultation. Respiratory effort normal.  On room air Cardiovascular system: S1 & S2 heard, RRR. No JVD, murmurs, rubs, gallops or clicks. No pedal edema. Gastrointestinal system: Abdomen is nondistended, soft and  nontender. No organomegaly or masses felt. Normal bowel sounds heard.  GJ tube noted; jejunum port nonfunctional per nursing Central nervous system: Alert and oriented. No focal neurological deficits. Extremities: Symmetric 5 x 5 power. Skin: No rashes, lesions or ulcers Psychiatry: Judgement and insight appear normal. Mood & affect appropriate.     Data Reviewed: I have personally reviewed following labs and imaging studies  CBC: Recent Labs  Lab 03/02/21 0122  WBC 4.9  HGB 9.8*  HCT 30.0*  MCV 94.9  PLT 096   Basic Metabolic Panel: Recent Labs  Lab 03/01/21 2058 03/02/21 0122 03/03/21 0241  NA 127* 127* 130*  K 4.4 4.0 4.1  CL 99 98 100  CO2 24 22 25   GLUCOSE 98 104* 108*  BUN 6* <5* <5*  CREATININE 0.39* 0.34* 0.41*  CALCIUM 8.7* 8.8* 8.6*   GFR: Estimated Creatinine Clearance: 40.3 mL/min (A) (by C-G formula based on SCr of 0.41 mg/dL (L)). Liver Function Tests: No results for input(s): AST, ALT, ALKPHOS, BILITOT, PROT, ALBUMIN in the last 168 hours. No results for input(s): LIPASE, AMYLASE in the last 168 hours. No results for input(s): AMMONIA in the last 168 hours. Coagulation Profile: No results for input(s): INR, PROTIME in the  last 168 hours. Cardiac Enzymes: No results for input(s): CKTOTAL, CKMB, CKMBINDEX, TROPONINI in the last 168 hours.  BNP (last 3 results) No results for input(s): PROBNP in the last 8760 hours. HbA1C: No results for input(s): HGBA1C in the last 72 hours. CBG: Recent Labs  Lab 03/02/21 2014 03/03/21 0258 03/03/21 0447 03/03/21 0809 03/03/21 1143  GLUCAP 93 111* 114* 121* 117*   Lipid Profile: No results for input(s): CHOL, HDL, LDLCALC, TRIG, CHOLHDL, LDLDIRECT in the last 72 hours. Thyroid Function Tests: No results for input(s): TSH, T4TOTAL, FREET4, T3FREE, THYROIDAB in the last 72 hours. Anemia Panel: No results for input(s): VITAMINB12, FOLATE, FERRITIN, TIBC, IRON, RETICCTPCT in the last 72 hours. Sepsis  Labs: No results for input(s): PROCALCITON, LATICACIDVEN in the last 168 hours.  Recent Results (from the past 240 hour(s))  SARS CORONAVIRUS 2 (TAT 6-24 HRS) Nasopharyngeal Nasopharyngeal Swab     Status: Abnormal   Collection Time: 03/02/21  2:12 AM   Specimen: Nasopharyngeal Swab  Result Value Ref Range Status   SARS Coronavirus 2 POSITIVE (A) NEGATIVE Final    Comment: (NOTE) SARS-CoV-2 target nucleic acids are DETECTED.  The SARS-CoV-2 RNA is generally detectable in upper and lower respiratory specimens during the acute phase of infection. Positive results are indicative of the presence of SARS-CoV-2 RNA. Clinical correlation with patient history and other diagnostic information is  necessary to determine patient infection status. Positive results do not rule out bacterial infection or co-infection with other viruses.  The expected result is Negative.  Fact Sheet for Patients: SugarRoll.be  Fact Sheet for Healthcare Providers: https://www.woods-mathews.com/  This test is not yet approved or cleared by the [place] FDA and  has been authorized for detection and/or diagnosis of SARS-CoV-2 by FDA under an Emergency Use Authorization (EUA). This EUA will remain  in effect (meaning this test can be used) for the duration of the COVID-19 declaration under Section 564(b)(1) of the Act, 21 U. S.C. section 360bbb-3(b)(1), unless the authorization is terminated or revoked sooner.   Performed at [facility], [place] [address] [place], [place] [address]          Radiology Studies: No results found.      Scheduled Meds:  scopolamine  1 patch Transdermal Q72H   Continuous Infusions:  dextrose 5 % and 0.9% NaCl 75 mL/hr at 03/03/21 0412     LOS: 1 day    Time spent: 38 minutes spent on chart review, discussion with nursing staff, consultants, updating family and interview/physical exam; more than 50% of that time was  spent in counseling and/or coordination of care.    Arbutus Nelligan J [place] DO [facility] Available via Epic secure chat 7am-7pm After these hours, please refer to coverage provider listed on [email or web address] 03/03/2021, 12:44 PM

## 2021-03-03 NOTE — Consult Note (Signed)
Chief Complaint: GJ tube malposition. Request is for GJ tube exchange - 20 Fr.   Referring Physician(s): Dr. Terrilee Croak  Supervising Physician: Gilmer Mor  Patient Status: [facility] - In-pt  History of Present Illness: Kathryn [name] is a 80 y.o. female History of HTN, DM,  Positive SSA and ANA (ongoing evaluation) dysphagia with an abnormal esophagram. Surgery placed a 24 fr gastrostomy tube on 7.28.22 the tube was found to be dislodged and was registered by surgery. On 8.8.22 IR converted the G to GJ tube. The gastrostomy tube replaced with 20 Fr at that time. Patient has had issues since discharged with the tube being clogged. The patient presented to the ED at [facility] for devaluation of GJ. Per xray taken at [facility] the balloon of the g tube is in the abdominal wall. Team is requesting GJ tube exchange.  Past Medical History:  Diagnosis Date   Acid reflux    Diabetes (HCC)    controlled with diet.    HTN (hypertension)     Past Surgical History:  Procedure Laterality Date   ABDOMINAL HYSTERECTOMY     BOTOX INJECTION N/A 01/15/2021   Procedure: BOTOX INJECTION;  Surgeon: Lanelle Bal, DO;  Location: [facility] ENDO SUITE;  Service: Endoscopy;  Laterality: N/A;   BOWEL RESECTION N/A 01/22/2021   Procedure: SMALL BOWEL RESECTION;  Surgeon: Lucretia Roers, MD;  Location: [facility] ORS;  Service: General;  Laterality: N/A;   CHOLECYSTECTOMY     ESOPHAGEAL DILATION N/A 10/24/2020   Procedure: ESOPHAGEAL DILATION;  Surgeon: Corbin Ade, MD;  Location: [facility] ENDO SUITE;  Service: Endoscopy;  Laterality: N/A;   ESOPHAGOGASTRODUODENOSCOPY N/A 10/24/2020   Procedure: ESOPHAGOGASTRODUODENOSCOPY (EGD);  Surgeon: Corbin Ade, MD;  Location: [facility];  Service: Endoscopy;  Laterality: N/A;   ESOPHAGOGASTRODUODENOSCOPY (EGD) WITH PROPOFOL N/A 01/15/2021   Procedure: ESOPHAGOGASTRODUODENOSCOPY (EGD) WITH PROPOFOL;  Surgeon: Lanelle Bal, DO;  Location: [facility];  Service:  Endoscopy;  Laterality: N/A;   GASTROSTOMY N/A 01/23/2021   Procedure: REINSERTION OF GASTROSTOMY TUBE;  Surgeon: Lucretia Roers, MD;  Location: [facility] ORS;  Service: General;  Laterality: N/A;   GASTROSTOMY N/A 01/22/2021   Procedure: INSERTION OF GASTROSTOMY TUBE;  Surgeon: Lucretia Roers, MD;  Location: [facility] ORS;  Service: General;  Laterality: N/A;   Histosalpingogram     IR GASTR TUBE CONVERT GASTR-JEJ PER W/FL MOD SED  02/02/2021   LYSIS OF ADHESION  01/22/2021   Procedure: LYSIS OF ADHESION;  Surgeon: Lucretia Roers, MD;  Location: [facility] ORS;  Service: General;;    Allergies: Cyclobenzaprine hcl  Medications: Prior to Admission medications   Medication Sig Start Date End Date Taking? Authorizing Provider  acetaminophen (TYLENOL) 325 MG tablet Place 2 tablets (650 mg total) into feeding tube every 6 (six) hours as needed for mild pain, fever or headache. 02/06/21   Emokpae, Courage, MD  GOODSENSE ASPIRIN LOW DOSE 81 MG EC tablet Take 81 mg by mouth daily. 02/06/21   [provider]  HYDROcodone-acetaminophen (HYCET) 7.5-325 mg/15 ml solution Take 10 mLs by mouth every 12 (twelve) hours as needed for moderate pain or severe pain. Patient not taking: Reported on 02/23/2021 02/06/21 02/06/22  Shon Hale, MD  insulin lispro (HUMALOG KWIKPEN) 100 UNIT/ML KwikPen Inject 0-10 Units into the skin 3 (three) times daily. insulin Humalog injection 0-10 Units 0-10 Units Subcutaneous, 3 times daily with meals CBG < 70: Implement Hypoglycemia Standing Orders and refer to Hypoglycemia Standing Orders sidebar report  CBG 70 - 120: 0 unit CBG 121 - 150: 0 unit  CBG 151 - 200: 1 unit CBG 201 - 250: 2 units CBG 251 - 300: 4 units CBG 301 - 350: 6 units  CBG 351 - 400: 8 units  CBG > 400: 10 units 02/06/21   Emokpae, Courage, MD  lansoprazole (PREVACID) 3 mg/ml SUSP oral suspension Place 5 mLs (15 mg total) into feeding tube daily at 12 noon. Patient not taking: Reported on 02/23/2021 02/06/21    Shon HaleEmokpae, Courage, MD  metoprolol tartrate (LOPRESSOR) 25 MG tablet Take 0.5 tablets (12.5 mg total) by mouth 2 (two) times daily. 02/06/21   Shon HaleEmokpae, Courage, MD  Nutritional Supplements (FEEDING SUPPLEMENT, OSMOLITE 1.2 CAL,) LIQD Place 1,000 mLs into feeding tube continuous. 02/06/21   Shon HaleEmokpae, Courage, MD  scopolamine (TRANSDERM-SCOP) 1 MG/3DAYS Place 1 patch (1.5 mg total) onto the skin every 3 (three) days. 02/06/21   Shon HaleEmokpae, Courage, MD  triamcinolone ointment (KENALOG) 0.1 % Apply topically. 09/26/20   [provider]  Water For Irrigation, Sterile (FREE WATER) SOLN Place 100 mLs into feeding tube 3 (three) times daily. 02/06/21   Shon HaleEmokpae, Courage, MD     Family History  Problem Relation Age of Onset   Colon cancer Father        diagnosed in his late 960s.   COPD Son    Esophageal cancer Neg Hx    Stomach cancer Neg Hx     Social History   Socioeconomic History   Marital status: Widowed    Spouse name: Not on file   Number of children: Not on file   Years of education: Not on file   Highest education level: Not on file  Occupational History   Not on file  Tobacco Use   Smoking status: Never   Smokeless tobacco: Never  Vaping Use   Vaping Use: Never used  Substance and Sexual Activity   Alcohol use: No   Drug use: No   Sexual activity: Never  Other Topics Concern   Not on file  Social History Narrative   Not on file   Social Determinants of Health   Financial Resource Strain: Not on file  Food Insecurity: Not on file  Transportation Needs: Not on file  Physical Activity: Not on file  Stress: Not on file  Social Connections: Not on file      Vital Signs: BP 133/79 (BP Location: Left Arm)   Pulse 91   Temp 98.4 F (36.9 C) (Oral)   Resp 18   Ht 5\' 4"  (1.626 m)   SpO2 100%   BMI 17.51 kg/m     Imaging: IR GASTR TUBE CONVERT GASTR-JEJ PER W/FL MOD SED  Result Date: 02/02/2021 INDICATION: 80 year old female referred for conversion of surgical  gastrostomy to a gastrojejunostomy Additional history of continuous leakage at the gastrostomy site. The patient had a surgical gastrostomy placed, 24 [place], which was displaced by the patient. A repeat surgery with replacement of a 20 French gastrostomy, balloon retention, was performed. EXAM: CONVERT G-TUBE TO G-JTUBE MEDICATIONS: None ANESTHESIA/SEDATION: Versed 1.0 mg IV; Fentanyl 50 mcg IV Moderate Sedation Time:  45 minutes The patient was continuously monitored during the procedure by the interventional radiology nurse under my direct supervision. CONTRAST:  20 cc FLUOROSCOPY TIME:  Fluoroscopy Time: 22 minutes 30 seconds (101 mGy). COMPLICATIONS: None PROCEDURE: Informed written consent was obtained from the patient and the patient's family after a thorough discussion of the procedural risks, benefits and alternatives. All questions  were addressed. Maximal Sterile Barrier Technique was utilized including caps, mask, sterile gowns, sterile gloves, sterile drape, hand hygiene and skin antiseptic. A timeout was performed prior to the initiation of the procedure. The epigastrium including the indwelling gastrostomy was prepped with Betadine in a sterile fashion, and a sterile drape was applied covering the operative field. A sterile gown and sterile gloves were used for the procedure. Contrast was infused to confirm location within the stomach. The configuration of the contrast in the stomach suggest that this gastrostomy is in the distal stomach, beyond the incisura, and near the pylorus/pyloric channel. A 65 cm Kumpe the catheter was advanced through the gastrostomy. A Glidewire was used to probe for the pylorus, unsuccessful. A Cobra catheter was then used with a Glidewire to probe for the pylorus/duodenum. This was unsuccessful. Kumpe the catheter was then used with a standard Glidewire, with a circuitous route through the cardia of the stomach, looped back to the body/pyloric channel, and ultimately  through the duodenum. This redundant route allow the catheter wire combination to go below the balloon on the G-tube. This redundant catheter in wire configuration was under favorable, and ultimately could not be reduced for a more direct route through the pyloric channel. At this point we placed a tandem Bentson wire into the stomach lumen, cut the retention sutures at the skin, and deflated the balloon, partly withdrawing on the gastrostomy. Kumpe the catheter and the Cobra catheter were used to probe for the pylorus, unsuccessful. Ultimately, a 4 French omni Flush catheter was needed to direct the standard Glidewire through the pyloric channel and into the duodenum. The Omni Flush catheter was then advanced on the Glidewire. With a Glidewire in the duodenum, the 4 [place] omni Flush catheter was withdrawn and the Kumpe the catheter was advanced on the Glidewire to the proximal jejunum. Wire was withdrawn and contrast was infused confirming the location. A 22 French gastrojejunostomy was selected. The final 15 cm was amputated. The tube was then advanced on the Glidewire after withdrawal of the Bentson wire. Approximately 5 cc of mixed contrast and saline were used to inflate the balloon. Contrast was then injected confirming location within the stomach. Patient tolerated the procedure well and remained hemodynamically stable throughout. No complications were encountered and no significant blood loss encountered. IMPRESSION: Exchange of surgical gastrostomy for a new gastrojejunostomy, as above. The site of access for the surgical gastrostomy may not be favorable for [place]-term effectiveness. Signed, Yvone Neu. Loreta Ave, DO [facility] Radiology Electronically Signed   By: Gilmer Mor D.O.   On: 02/02/2021 15:54    Labs:  CBC: Recent Labs    02/02/21 0528 02/05/21 0358 02/06/21 0804 03/02/21 0122  WBC 15.1* 9.4 10.4 4.9  HGB 8.8* 7.9* 8.2* 9.8*  HCT 27.1*  25.0* 26.4* 30.0*  PLT 255 306 301 370    COAGS: Recent Labs    01/14/21 0409  INR 1.0  APTT 28    BMP: Recent Labs    02/06/21 0804 03/01/21 2058 03/02/21 0122 03/03/21 0241  NA 127* 127* 127* 130*  K 4.5 4.4 4.0 4.1  CL 98 99 98 100  CO2 24 24 22 25   GLUCOSE 165* 98 104* 108*  BUN 12 6* <5* <5*  CALCIUM 7.7* 8.7* 8.8* 8.6*  CREATININE 0.41* 0.39* 0.34* 0.41*  GFRNONAA >60 >60 >60 >60    LIVER FUNCTION TESTS: Recent Labs    01/29/21 0443 01/31/21 0545 02/02/21 0528 02/03/21 0329  BILITOT  0.5 0.5 0.5 0.4  AST 35 47* 66* 36  ALT 19 29 63* 46*  ALKPHOS 92 133* 188* 152*  PROT 6.0* 5.8* 6.0* 5.7*  ALBUMIN 1.9* 1.9* 1.9* 1.8*      Assessment and Plan:  80 y.o. female inpatient. History of HTN, DM,  Positive SSA and ANA (ongoing evaluation) dysphagia with an abnormal esophagram. Surgery placed a 24 fr gastrostomy tube on 7.28.22 the tube was found to be dislodged and was registered by surgery. On 8.8.22 IR converted the G to GJ tube. The gastrostomy tube replaced with 20 Fr at that time.  Patient has had issues since discharged with the tube being clogged. The patient presented to the ED at [facility] for devaluation of GJ. Per xray taken at [facility] the balloon of the g tube is in the abdominal wall. Team is requesting GJ tube exchange.  Sodium 130, BUN < 5, Cr 8.6. All other labs and medications are within acceptable parameters. No pertinent allergies.   IR consulted for possible GJ tube exchange. Case has been reviewed and procedure approved by Dr. Loreta Ave.  Patient tentatively scheduled for 9.7.22.  IR will call patient when ready.   Thank you for this interesting consult.  I greatly enjoyed meeting Kathryn [name] and look forward to participating in their care.  A copy of this report was sent to the requesting provider on this date.  Electronically Signed: Alene Mires, NP 03/03/2021, 10:51 AM   I spent a total of 40 Minutes    in face to  face in clinical consultation, greater than 50% of which was counseling/coordinating care for GJ tube exchange

## 2021-03-03 NOTE — Plan of Care (Signed)

## 2021-03-04 ENCOUNTER — Inpatient Hospital Stay (HOSPITAL_COMMUNITY): Payer: Medicare Other

## 2021-03-04 HISTORY — PX: IR GJ TUBE CHANGE: IMG1440

## 2021-03-04 LAB — BASIC METABOLIC PANEL
Anion gap: 5 (ref 5–15)
BUN: <5 mg/dL — ABNORMAL LOW (ref 8–23)
CO2: 24 mmol/L (ref 22–32)
Calcium: 8.7 mg/dL — ABNORMAL LOW (ref 8.9–10.3)
Chloride: 103 mmol/L (ref 98–111)
Creatinine, Ser: 0.35 mg/dL — ABNORMAL LOW (ref 0.44–1.00)
GFR, Estimated: >60 mL/min (ref >60)
Glucose, Bld: 82 mg/dL (ref 70–99)
Potassium: 3.9 mmol/L (ref 3.5–5.1)
Sodium: 132 mmol/L — ABNORMAL LOW (ref 135–145)

## 2021-03-04 LAB — GLUCOSE, CAPILLARY
Glucose-Capillary: 102 mg/dL — ABNORMAL HIGH (ref 70–99)
Glucose-Capillary: 106 mg/dL — ABNORMAL HIGH (ref 70–99)
Glucose-Capillary: 80 mg/dL (ref 70–99)
Glucose-Capillary: 92 mg/dL (ref 70–99)
Glucose-Capillary: 94 mg/dL (ref 70–99)
Glucose-Capillary: 95 mg/dL (ref 70–99)

## 2021-03-04 MED ORDER — PROSOURCE TF PO LIQD
45.0000 mL | Freq: Every day | ORAL | Status: DC
  Administered 2021-03-05 – 2021-03-07 (×3): 45 mL
  Filled 2021-03-04 (×3): qty 45

## 2021-03-04 MED ORDER — OSMOLITE 1.2 CAL PO LIQD
1000.0000 mL | ORAL | Status: DC
  Administered 2021-03-05 – 2021-03-06 (×2): 1000 mL
  Filled 2021-03-04 (×5): qty 1000

## 2021-03-04 MED ORDER — FREE WATER
120.0000 mL | Freq: Four times a day (QID) | Status: DC
  Administered 2021-03-05 – 2021-03-07 (×9): 120 mL

## 2021-03-04 MED ORDER — LIDOCAINE VISCOUS HCL 2 % MT SOLN
OROMUCOSAL | Status: AC
  Filled 2021-03-04: qty 15

## 2021-03-04 NOTE — Progress Notes (Signed)
Nutrition Follow-up  DOCUMENTATION CODES:   Severe malnutrition in context of chronic illness  INTERVENTION:   Once tube is replaced:    Initiate Osmolite 1.2 @ 20 ml/hr via j-port and increase by 10 ml every 4 hours to goal rate of 60 ml/hr.    45 ml Prosource TF daily.   120 ml free water flush every 6 hours     Tube feeding regimen provides 1728 kcal (100% of needs), 91 grams of protein, and 1181 ml of H2O.  Total free water: 1901 ml daily  NUTRITION DIAGNOSIS:   Severe Malnutrition related to chronic illness (achalasia) as evidenced by moderate fat depletion, severe fat depletion, moderate muscle depletion, severe muscle depletion, percent weight loss.  Ongoing  GOAL:   Patient will meet greater than or equal to 90% of their needs  Progressing   MONITOR:   Labs, Weight trends, Skin, I & O's  REASON FOR ASSESSMENT:   Malnutrition Screening Tool    ASSESSMENT:   Kathryn [name] is a 80 y.o. female with medical history significant for esophageal dysphagia/achalasia s/p G/J-tube, chronic hyponatremia, type 2 diabetes, hypertension, anemia, positive SSA and ANA (for which Sjogren's syndrome and SLE around differential) who is admitted for GJ tube evaluation.  Reviewed I/O's: 0 ml x 24 hours and +1.1 L since admission   IR has evaluated the patient. Plan for G-J exchange today. Case discussed with Dr. Lowell Guitar; received permission to resume TF once tube is exchanged.   Medications reviewed and include dextrose 5%-0.9% sodium chloride infusion @ 75 ml/hr.   Labs reviewed: Na: 132, CBGS: 80-106.  Diet Order:   Diet Order             Diet NPO time specified Except for: Sips with Meds  Diet effective midnight                   EDUCATION NEEDS:   Education needs have been addressed  Skin:  Skin Assessment: Skin Integrity Issues: Skin Integrity Issues:: Other (Comment) Other: upper knot on forehead, MASD to perineum  Last BM:  03/04/21  Height:   Ht  Readings from Last 1 Encounters:  03/01/21 5\' 4"  (1.626 m)    Weight:   Wt Readings from Last 1 Encounters:  02/23/21 46.3 kg    Ideal Body Weight:  54.5 kg  BMI:  Body mass index is 17.51 kg/m.  Estimated Nutritional Needs:   Kcal:  1650-1850  Protein:  90-105 grams  Fluid:  > 1.6 L    02/25/21, RD, LDN, CDCES Registered Dietitian II Certified Diabetes Care and Education Specialist Please refer to [facility] for RD and/or RD on-call/weekend/after hours pager

## 2021-03-04 NOTE — Plan of Care (Signed)
Covid positive precautions in place. Patient had GJ tube exchange this afternoon. VSS. No complaints throughout the day. Spoke with Dr. Lowell Guitar about starting TF and flushes for night shift rather then waiting till AM. Per MD- see what IR says, if no notes, page again so he can call IR.  Daughter at bedside. Made aware about covid results.   Problem: Clinical Measurements: Goal: Ability to maintain clinical measurements within normal limits will improve Outcome: Progressing Goal: Will remain free from infection Outcome: Progressing Goal: Diagnostic test results will improve Outcome: Progressing Goal: Respiratory complications will improve Outcome: Progressing Goal: Cardiovascular complication will be avoided Outcome: Progressing   Problem: Elimination: Goal: Will not experience complications related to bowel motility Outcome: Progressing Goal: Will not experience complications related to urinary retention Outcome: Progressing   Problem: Pain Managment: Goal: General experience of comfort will improve Outcome: Progressing   Problem: Safety: Goal: Ability to remain free from injury will improve Outcome: Progressing   Problem: Skin Integrity: Goal: Risk for impaired skin integrity will decrease Outcome: Progressing

## 2021-03-04 NOTE — Plan of Care (Signed)
  Problem: Health Behavior/Discharge Planning: Goal: Ability to manage health-related needs will improve Outcome: Progressing   Problem: Clinical Measurements: Goal: Ability to maintain clinical measurements within normal limits will improve Outcome: Progressing Goal: Will remain free from infection Outcome: Progressing   Problem: Activity: Goal: Risk for activity intolerance will decrease Outcome: Progressing   

## 2021-03-04 NOTE — Progress Notes (Signed)
PROGRESS NOTE    Kathryn [name]  GEX:[id number] DOB: [birth year] DOA: 03/01/2021 PCP: Kathryn [name], Kathryn [name]    No chief complaint on file.   Brief Narrative:  Kathryn [name] is Kathryn [name] 80 y.o. female with medical history significant for esophageal dysphagia/achalasia s/p G/J-tube, chronic hyponatremia, type 2 diabetes, hypertension, anemia, positive SSA and ANA (for which Sjogren's syndrome and SLE are on differential) who presented to [facility] ED for evaluation of GJ tube dysfunction.   Patient recently admitted 01/13/2021-02/06/2021 for epigastric/abdominal pain with nausea and vomiting in the setting of esophageal dysphagia.  She underwent G-tube placement on 7/28 with subsequent dislodgment, replacement, and finally GJ tube conversion by IR on 8/8.  She was seen in the ED 8/21 for blocked feeding tube which was cleaned and flushed easily. She saw general surgery in follow-up on 8/25.  Tube was clogged on exam and subsequently flushed with evacuation of feeds from the lumen with saline.  Patient was advised to flush the jejunal port every 4 hours and after feeds stopped or held.   Patient returned to the ED from SNF due to persistent clogging of the GJ tube.  Per daughter, she has not been able to receive any feeds last 24 hours.  Patient is having some mild abdominal discomfort around the G-tube insertion site and excessive oral secretions but denies any significant nausea, vomiting.  She denies any chest pain, dyspnea, dysuria.   [facility] course:  BP 92/63, pulse 93, RR 16, temp 98.4 F, SPO2 94% on room air. Labs showed WBC 6.2, hemoglobin 9.6, platelets 313,000, sodium 122. Abdominal x-ray showed gastrojejunostomy catheter likely partially withdrawn with the retaining balloon inflated within the tract of the abdominal wall.  Gastric lumen and jejunal lumen appropriately positioned.  Normal abdominal gas pattern. Transfer was requested to [facility] for IR consultative  services.  Assessment & Plan:   Principal Problem:   Gastrojejunostomy tube status ([place]) Active Problems:   Hypertension associated with diabetes ([place])   Type 2 diabetes mellitus ([place])   Chronic hyponatremia   PEG tube malfunction ([facility])   Protein-calorie malnutrition, severe  Esophageal dysphagia/achalasia s/p GJ tube with concern for tube dysfunction/placement: GJ tube placed by IR 02/02/2021.  Patient having repeat issues of clogging, now sent from [facility] ED due to concern for displacement.  Abdominal x-[facility] at [facility] with gastrojejunostomy catheter likely partially withdrawn within the retaining balloon inflated within the track of the abdominal wall.  She is having some mild discomfort around tube insertion site. - PEG tube location plain film 9/7 with GJ tube, intragastric position of the balloon cannot be confirmed on this exam due to insufficient contrast around balloon -> recommended direct observation under fluoroscopic injection of contrast --N.p.o., hold tube feeds for now -> resume per IR --Continue D5NS at 75 mL/hour  --IR plans GJ tube exchange 9/7 - back from procedure, pending note for details and clarification of when tube can be utilized - holding off until then   COVID 19 Virus Infection Tested positive 6/30 - mild symptoms, weakness and fatigue Tested negative 8/11 here at [facility] 02/13/21 -> negative rapid antigen test (per discussion with [name]) 02/20/21 -> positive PCR (per discussion with [facility]) Unclear what to make of her tests.  Typically would avoid retesting within 3 months of Kathryn [name] positive test unless convincing reasons.  For now will keep on precautions given our negative in the interim, though I think as she's asymptomatic and tested positive on  8/26 (>10 days ago), can likely discontinue her precautions.  May ask them to fax results before doing this.  Chronic hyponatremia: Sodium 117 on 9/2 and 122 on 9/4 prior to arrival.  Recent  baseline appears to be around 130. --Na 127>130 --Continue D5NS --BMP daily --Once GJ tube exchanged, nutrition consulted to resume tube feeds   Type 2 diabetes: A1c 5.8% on 12/26/2020.  On insulin sliding scale outpatient. --Holding insulin for now since NPO and not receiving tube feeds.   --CBG q4h.   Hypertension: BP 114/49 this morning. --Continue to hold metoprolol 42m PO BID for now. --Holding aspirin --continue to monitor BP closely   Excessive secretions: Continue scopolamine patch.   Anemia of chronic disease: Stable, continue monitor.   Positive SSA and ANA: Differential including Sjogren's syndrome and SLE.  Seen by rheumatology, Dr. RBenjamine Molaon 02/23/2021. Repeat labs next visit showed positive ANA with ANA titer 1:320 and homogenous and speckled pattern.  ESR 86, aldolase 3.6, CK 76.  Continue to follow with rheumatology outpatient.  DVT prophylaxis: SCD Code Status:DNR Family Communication: daughter over phone Disposition:   Status is: Inpatient  Remains inpatient appropriate because:Inpatient level of care appropriate due to severity of illness  Dispo:  Patient From: [place] Planned Disposition: [facility] Medically stable for discharge: No         Consultants:  IR  Procedures:  Pending note from IR  Antimicrobials: Anti-infectives (From admission, onward)    None          Subjective: No complaints  Objective: Vitals:   03/03/21 2130 03/04/21 0700 03/04/21 0900 03/04/21 1655  BP: 113/71 127/61 (!) 132/54 121/60  Pulse: 90 89 86 95  Resp: 18 17  16   Temp: 97.8 F (36.6 C) 98 F (36.7 C) 98 F (36.7 C) 98.8 F (37.1 C)  TempSrc: Oral Oral Oral Oral  SpO2: 100% 100% 100% 100%  Height:       No intake or output data in the 24 hours ending 03/04/21 1923 There were no vitals filed for this visit.  Examination:  General exam: Appears calm and comfortable  Respiratory system:  unlabored Cardiovascular system: RRR Gastrointestinal system: GJ tube without significant TTP Central nervous system: Alert and oriented. No focal neurological deficits. Extremities: no LEE Skin: No rashes, lesions or ulcers Psychiatry: Judgement and insight appear normal. Mood & affect appropriate.     Data Reviewed: I have personally reviewed following labs and imaging studies  CBC: Recent Labs  Lab 03/02/21 0122  WBC 4.9  HGB 9.8*  HCT 30.0*  MCV 94.9  PLT 3615   Basic Metabolic Panel: Recent Labs  Lab 03/01/21 2058 03/02/21 0122 03/03/21 0241 03/04/21 0026  NA 127* 127* 130* 132*  K 4.4 4.0 4.1 3.9  CL 99 98 100 103  CO2 24 22 25 24   GLUCOSE 98 104* 108* 82  BUN 6* <5* <5* <5*  CREATININE 0.39* 0.34* 0.41* 0.35*  CALCIUM 8.7* 8.8* 8.6* 8.7*    GFR: Estimated Creatinine Clearance: 40.3 mL/min (Kathryn [name]) (by C-G formula based on SCr of 0.35 mg/dL (L)).  Liver Function Tests: No results for input(s): AST, ALT, ALKPHOS, BILITOT, PROT, ALBUMIN in the last 168 hours.  CBG: Recent Labs  Lab 03/04/21 0005 03/04/21 0348 03/04/21 0713 03/04/21 1157 03/04/21 1653  GLUCAP 80 95 106* 102* 92     Recent Results (from the past 240 hour(s))  SARS CORONAVIRUS 2 (TAT 6-24 HRS) Nasopharyngeal Nasopharyngeal Swab  Status: Abnormal   Collection Time: 03/02/21  2:12 AM   Specimen: Nasopharyngeal Swab  Result Value Ref Range Status   SARS Coronavirus 2 POSITIVE (Kathryn [name]) NEGATIVE Final    Comment: (NOTE) SARS-CoV-2 target nucleic acids are DETECTED.  The SARS-CoV-2 RNA is generally detectable in upper and lower respiratory specimens during the acute phase of infection. Positive results are indicative of the presence of SARS-CoV-2 RNA. Clinical correlation with patient history and other diagnostic information is  necessary to determine patient infection status. Positive results do not rule out bacterial infection or co-infection with other viruses.  The expected result is  Negative.  Fact Sheet for Patients: SugarRoll.be  Fact Sheet for Healthcare Providers: https://www.woods-mathews.com/  This test is not yet approved or cleared by the [place] FDA and  has been authorized for detection and/or diagnosis of SARS-CoV-2 by FDA under an Emergency Use Authorization (EUA). This EUA will remain  in effect (meaning this test can be used) for the duration of the COVID-19 declaration under Section 564(b)(1) of the Act, 21 U. S.C. section 360bbb-3(b)(1), unless the authorization is terminated or revoked sooner.   Performed at [facility], [place] [address] [place], [place] [address]          Radiology Studies: DG ABDOMEN PEG TUBE LOCATION  Result Date: 03/03/2021 CLINICAL DATA:  Peg tube malfunction EXAM: ABDOMEN - 1 VIEW COMPARISON:  02/02/2021, 03/01/2021 FINDINGS: Gastro jejunostomy tube with balloon in the left mid abdomen, indeterminate in location as there is insufficient contrast around the balloon to confirm intragastric position. There is contrast within the stomach rectum. Tip of the jejunostomy is in the left mid abdomen. IMPRESSION: 1. Gastro jejunostomy tube; intragastric position of the balloon can not be confirmed on this exam due to insufficient contrast around the balloon; contrast is noted within the proximal stomach and rectum. Direct observation under fluoroscopic injection of contrast would be more definitive to ascertain position; alternatively CT could be considered. Electronically Signed   By: Donavan Foil M.D.   On: 03/03/2021 15:23        Scheduled Meds:  [START ON 03/05/2021] feeding supplement (PROSource TF)  45 mL Per Tube Daily   [START ON 03/05/2021] free water  120 mL Per Tube Q6H   lidocaine       scopolamine  1 patch Transdermal Q72H   Continuous Infusions:  dextrose 5 % and 0.9% NaCl 75 mL/hr at 03/04/21 1415   [START ON 03/05/2021] feeding supplement (OSMOLITE 1.2 CAL)        LOS: 2 days    Time spent: over 30 min    Kathryn Helper, Kathryn [name] [facility]   To contact the attending provider between 7A-7P or the covering provider during after hours 7P-7A, please log into the web site [email or web address] and access using universal [place] password for that web site. If you do not have the password, please call the hospital operator.  03/04/2021, 7:23 PM

## 2021-03-05 LAB — CBC WITH DIFFERENTIAL/PLATELET
Abs Immature Granulocytes: 0 10*3/uL (ref 0.00–0.07)
Basophils Absolute: 0 10*3/uL (ref 0.0–0.1)
Basophils Relative: 0 %
Eosinophils Absolute: 0 10*3/uL (ref 0.0–0.5)
Eosinophils Relative: 1 %
HCT: 29.7 % — ABNORMAL LOW (ref 36.0–46.0)
Hemoglobin: 9.5 g/dL — ABNORMAL LOW (ref 12.0–15.0)
Immature Granulocytes: 0 %
Lymphocytes Relative: 36 %
Lymphs Abs: 1.5 10*3/uL (ref 0.7–4.0)
MCH: 30.6 pg (ref 26.0–34.0)
MCHC: 32 g/dL (ref 30.0–36.0)
MCV: 95.8 fL (ref 80.0–100.0)
Monocytes Absolute: 0.3 10*3/uL (ref 0.1–1.0)
Monocytes Relative: 8 %
Neutro Abs: 2.2 10*3/uL (ref 1.7–7.7)
Neutrophils Relative %: 55 %
Platelets: 283 10*3/uL (ref 150–400)
RBC: 3.1 MIL/uL — ABNORMAL LOW (ref 3.87–5.11)
RDW: 15.5 % (ref 11.5–15.5)
WBC: 4.1 10*3/uL (ref 4.0–10.5)
nRBC: 0 % (ref 0.0–0.2)

## 2021-03-05 LAB — HEMOGLOBIN A1C
Hgb A1c MFr Bld: 5.2 % (ref 4.8–5.6)
Mean Plasma Glucose: 102.54 mg/dL

## 2021-03-05 LAB — COMPREHENSIVE METABOLIC PANEL
ALT: 17 U/L (ref 0–44)
AST: 22 U/L (ref 15–41)
Albumin: 2.1 g/dL — ABNORMAL LOW (ref 3.5–5.0)
Alkaline Phosphatase: 77 U/L (ref 38–126)
Anion gap: 3 — ABNORMAL LOW (ref 5–15)
BUN: <5 mg/dL — ABNORMAL LOW (ref 8–23)
CO2: 24 mmol/L (ref 22–32)
Calcium: 8.5 mg/dL — ABNORMAL LOW (ref 8.9–10.3)
Chloride: 105 mmol/L (ref 98–111)
Creatinine, Ser: 0.31 mg/dL — ABNORMAL LOW (ref 0.44–1.00)
GFR, Estimated: >60 mL/min (ref >60)
Glucose, Bld: 113 mg/dL — ABNORMAL HIGH (ref 70–99)
Potassium: 3.2 mmol/L — ABNORMAL LOW (ref 3.5–5.1)
Sodium: 132 mmol/L — ABNORMAL LOW (ref 135–145)
Total Bilirubin: 0.5 mg/dL (ref 0.3–1.2)
Total Protein: 5.6 g/dL — ABNORMAL LOW (ref 6.5–8.1)

## 2021-03-05 LAB — PHOSPHORUS: Phosphorus: 3 mg/dL (ref 2.5–4.6)

## 2021-03-05 LAB — GLUCOSE, CAPILLARY
Glucose-Capillary: 111 mg/dL — ABNORMAL HIGH (ref 70–99)
Glucose-Capillary: 111 mg/dL — ABNORMAL HIGH (ref 70–99)
Glucose-Capillary: 96 mg/dL (ref 70–99)
Glucose-Capillary: 121 mg/dL — ABNORMAL HIGH (ref 70–99)
Glucose-Capillary: 122 mg/dL — ABNORMAL HIGH (ref 70–99)

## 2021-03-05 LAB — MAGNESIUM: Magnesium: 1.6 mg/dL — ABNORMAL LOW (ref 1.7–2.4)

## 2021-03-05 MED ORDER — ASPIRIN 81 MG PO CHEW
81.0000 mg | CHEWABLE_TABLET | Freq: Every day | ORAL | Status: DC
  Administered 2021-03-05 – 2021-03-07 (×3): 81 mg
  Filled 2021-03-05 (×3): qty 1

## 2021-03-05 MED ORDER — INSULIN ASPART 100 UNIT/ML IJ SOLN: 0.0000 [IU] | Freq: Every day | INTRAMUSCULAR | Status: DC

## 2021-03-05 MED ORDER — LANSOPRAZOLE 3 MG/ML SUSP: 15.0000 mg | Freq: Every day | ORAL | Status: DC

## 2021-03-05 MED ORDER — POTASSIUM CHLORIDE 20 MEQ PO PACK
40.0000 meq | PACK | Freq: Once | ORAL | Status: AC
  Administered 2021-03-05: 40 meq
  Filled 2021-03-05: qty 2

## 2021-03-05 MED ORDER — MAGNESIUM SULFATE 2 GM/50ML IV SOLN
2.0000 g | Freq: Once | INTRAVENOUS | Status: AC
  Administered 2021-03-05: 2 g via INTRAVENOUS
  Filled 2021-03-05: qty 50

## 2021-03-05 MED ORDER — PANTOPRAZOLE SODIUM 40 MG PO PACK
40.0000 mg | PACK | Freq: Every day | ORAL | Status: DC
  Administered 2021-03-06 – 2021-03-07 (×2): 40 mg
  Filled 2021-03-05 (×2): qty 20

## 2021-03-05 MED ORDER — METOPROLOL TARTRATE 12.5 MG HALF TABLET
12.5000 mg | ORAL_TABLET | Freq: Two times a day (BID) | ORAL | Status: DC
  Administered 2021-03-05 – 2021-03-07 (×4): 12.5 mg
  Filled 2021-03-05 (×4): qty 1

## 2021-03-05 MED ORDER — INSULIN ASPART 100 UNIT/ML IJ SOLN
0.0000 [IU] | Freq: Three times a day (TID) | INTRAMUSCULAR | Status: DC
  Administered 2021-03-06 – 2021-03-07 (×2): 1 [IU] via SUBCUTANEOUS

## 2021-03-05 NOTE — Progress Notes (Addendum)
PROGRESS NOTE    Kathryn [name]  WIO:[id number] DOB: 01/27/1941 DOA: 03/01/2021 PCP: Leeanne Rio, MD    No chief complaint on file.   Brief Narrative:  Kathryn [name] is Kathryn [name] 80 y.o. female with medical history significant for esophageal dysphagia/achalasia s/p G/J-tube, chronic hyponatremia, type 2 diabetes, hypertension, anemia, positive SSA and ANA (for which Sjogren's syndrome and SLE are on differential) who presented to [facility] ED for evaluation of GJ tube dysfunction.   Patient recently admitted 01/13/2021-02/06/2021 for epigastric/abdominal pain with nausea and vomiting in the setting of esophageal dysphagia.  She underwent G-tube placement on 7/28 with subsequent dislodgment, replacement, and finally GJ tube conversion by IR on 8/8.  She was seen in the ED 8/21 for blocked feeding tube which was cleaned and flushed easily. She saw general surgery in follow-up on 8/25.  Tube was clogged on exam and subsequently flushed with evacuation of feeds from the lumen with saline.  Patient was advised to flush the jejunal port every 4 hours and after feeds stopped or held.   Patient returned to the ED from SNF due to persistent clogging of the GJ tube.  Per daughter, she has not been able to receive any feeds last 24 hours.  Patient is having some mild abdominal discomfort around the G-tube insertion site and excessive oral secretions but denies any significant nausea, vomiting.  She denies any chest pain, dyspnea, dysuria.   [facility] course:  BP 92/63, pulse 93, RR 16, temp 98.4 F, SPO2 94% on room air. Labs showed WBC 6.2, hemoglobin 9.6, platelets 313,000, sodium 122. Abdominal x-ray showed gastrojejunostomy catheter likely partially withdrawn with the retaining balloon inflated within the tract of the abdominal wall.  Gastric lumen and jejunal lumen appropriately positioned.  Normal abdominal gas pattern. Transfer was requested to [facility] for IR consultative  services.  Assessment & Plan:   Principal Problem:   Gastrojejunostomy tube status ([place]) Active Problems:   Hypertension associated with diabetes ([place])   Type 2 diabetes mellitus ([place])   Chronic hyponatremia   PEG tube malfunction (HCC)   Protein-calorie malnutrition, severe  Esophageal dysphagia/achalasia s/p GJ tube with concern for tube dysfunction/placement: GJ tube placed by IR 02/02/2021.  Patient having repeat issues of clogging, now sent from [facility] ED due to concern for displacement.  Abdominal x-ray at [facility] with gastrojejunostomy catheter likely partially withdrawn within the retaining balloon inflated within the track of the abdominal wall.  She is having some mild discomfort around tube insertion site. - PEG tube location plain film 9/7 with GJ tube, intragastric position of the balloon cannot be confirmed on this exam due to insufficient contrast around balloon -> recommended direct observation under fluoroscopic injection of contrast --N.p.o., hold tube feeds for now -> resume per IR --Continue D5NS at 75 mL/hour  --s/p fluorscopic guided replacement and upsizing GJ catheter, ready for use (9/7)   COVID 19 Virus Infection Tested positive 6/30 - mild symptoms, weakness and fatigue Tested negative 8/11 here at [facility] 02/13/21 -> negative rapid antigen test (per discussion with [facility]) 02/20/21 -> positive PCR (per discussion with [facility]) With positive test within 3 months of initial and no clear symptoms (as well as recent positives within past several weeks at [facility]), I don't think she needs to remain quarantined at this time   Chronic hyponatremia: Sodium 117 on 9/2 and 122 on 9/4 prior to arrival.  Recent baseline appears to be around 130. --now improved,  follow with resumption of tube feeds   Type 2 diabetes: A1c 5.8% on 12/26/2020.  On insulin sliding scale outpatient. --Resume SSI    Hypertension: - BP reasonable today --resume  metoprolol 12.5 mg PO BID --resume aspirin --continue to monitor BP closely   Excessive secretions: Continue scopolamine patch.   Anemia of chronic disease: Stable, continue monitor.   Positive SSA and ANA: Differential including Sjogren's syndrome and SLE.  Seen by rheumatology, Dr. Benjamine Mola on 02/23/2021. Repeat labs next visit showed positive ANA with ANA titer 1:320 and homogenous and speckled pattern.  ESR 86, aldolase 3.6, CK 76.  Continue to follow with rheumatology outpatient.  DVT prophylaxis: SCD Code Status:DNR Family Communication: daughter over phone 9/7 Disposition:   Status is: Inpatient  Remains inpatient appropriate because:Inpatient level of care appropriate due to severity of illness  Dispo:  Patient From: [place]  Planned Disposition: [place]  Medically stable for discharge: No         Consultants:  IR  Procedures:  Pending note from IR  Antimicrobials: Anti-infectives (From admission, onward)    None          Subjective: No new complaints  Objective: Vitals:   03/04/21 2047 03/05/21 0748 03/05/21 1116 03/05/21 1606  BP: 128/67 127/62 131/77 128/70  Pulse:  97 99 92  Resp:  18 19 18   Temp: 98.6 F (37 C) 98.5 F (36.9 C) 99 F (37.2 C) 99 F (37.2 C)  TempSrc: Oral Oral Oral Oral  SpO2: 100% 98% 100%   Weight:    46.2 kg  Height:    5' 3.78" (1.62 m)    Intake/Output Summary (Last 24 hours) at 03/05/2021 1849 Last data filed at 03/05/2021 1502 Gross per 24 hour  Intake 3910.12 ml  Output --  Net 3910.12 ml   Filed Weights   03/05/21 1606  Weight: 46.2 kg    Examination:  General: No acute distress. Cardiovascular: RRR Lungs: unlabored Abdomen: Soft, nontender, nondistended - GJ in place Neurological: Alert and oriented 3. Moves all extremities 4 . Cranial nerves II through XII grossly intact. Skin: Warm and dry. No rashes or lesions. Extremities: No clubbing or cyanosis. No edema.     Data Reviewed: I have personally reviewed following labs and imaging studies  CBC: Recent Labs  Lab 03/02/21 0122 03/05/21 0131  WBC 4.9 4.1  NEUTROABS  --  2.2  HGB 9.8* 9.5*  HCT 30.0* 29.7*  MCV 94.9 95.8  PLT 370 216    Basic Metabolic Panel: Recent Labs  Lab 03/01/21 2058 03/02/21 0122 03/03/21 0241 03/04/21 0026 03/05/21 0131  NA 127* 127* 130* 132* 132*  K 4.4 4.0 4.1 3.9 3.2*  CL 99 98 100 103 105  CO2 24 22 25 24 24   GLUCOSE 98 104* 108* 82 113*  BUN 6* <5* <5* <5* <5*  CREATININE 0.39* 0.34* 0.41* 0.35* 0.31*  CALCIUM 8.7* 8.8* 8.6* 8.7* 8.5*  MG  --   --   --   --  1.6*  PHOS  --   --   --   --  3.0    GFR: Estimated Creatinine Clearance: 40.2 mL/min (Alease Fait) (by C-G formula based on SCr of 0.31 mg/dL (L)).  Liver Function Tests: Recent Labs  Lab 03/05/21 0131  AST 22  ALT 17  ALKPHOS 77  BILITOT 0.5  PROT 5.6*  ALBUMIN 2.1*    CBG: Recent Labs  Lab 03/04/21 2049 03/05/21 0436 03/05/21 0744 03/05/21 1115 03/05/21  1611  GLUCAP 94 111* 111* 96 121*     Recent Results (from the past 240 hour(s))  SARS CORONAVIRUS 2 (TAT 6-24 HRS) Nasopharyngeal Nasopharyngeal Swab     Status: Abnormal   Collection Time: 03/02/21  2:12 AM   Specimen: Nasopharyngeal Swab  Result Value Ref Range Status   SARS Coronavirus 2 POSITIVE (Norwin Aleman) NEGATIVE Final    Comment: (NOTE) SARS-CoV-2 target nucleic acids are DETECTED.  The SARS-CoV-2 RNA is generally detectable in upper and lower respiratory specimens during the acute phase of infection. Positive results are indicative of the presence of SARS-CoV-2 RNA. Clinical correlation with patient history and other diagnostic information is  necessary to determine patient infection status. Positive results do not rule out bacterial infection or co-infection with other viruses.  The expected result is Negative.  Fact Sheet for Patients: SugarRoll.be  Fact Sheet for Healthcare  Providers: https://www.woods-mathews.com/  This test is not yet approved or cleared by the [place] FDA and  has been authorized for detection and/or diagnosis of SARS-CoV-2 by FDA under an Emergency Use Authorization (EUA). This EUA will remain  in effect (meaning this test can be used) for the duration of the COVID-19 declaration under Section 564(b)(1) of the Act, 21 U. S.C. section 360bbb-3(b)(1), unless the authorization is terminated or revoked sooner.   Performed at [facility], [place] [address] [place] [address]          Radiology Studies: IR GJ Tube Change  Result Date: 03/05/2021 INDICATION: Leaking and clogged feeding gastrojejunostomy catheter. EXAM: FLUOROSCOPIC GUIDED REPLACEMENT OF GASTROJEJUNOSTOMY TUBE COMPARISON:  Fluoroscopic guided conversion of surgical gastrostomy tube to Rebbie Lauricella feeding gastrojejunostomy tube-02/02/2021 MEDICATIONS: None. CONTRAST:  20 cc Omnipaque 300, administered into the stomach and proximal small bowel FLUOROSCOPY TIME:  1 minute COMPLICATIONS: None. PROCEDURE: Informed written consent was obtained from the patient after Sheza Strickland discussion of the risks and benefits. The upper abdomen and the external portion of the existing gastrojejunostomy tube was prepped and draped in the usual sterile fashion, and Nguyet Mercer sterile drape was applied covering the operative field. Maximum barrier sterile technique with sterile gowns and gloves were used for the procedure. [place] timeout was performed prior to the initiation of the procedure. Attempt was made to vigorously inject the jejunal lumen however this proved unsuccessful secondary to the catheter occlusion. The external portion of the balloon retention gastrostomy tube was cut and the balloon was deflated. Anamae Rochelle gastrojejunostomy catheter was partially retracted and jejunal catheter was cut and cannulated with Crystie Yanko stiff Glidewire which was advanced through the entirety of the jejunal lumen to the level of  the proximal small bowel. Under fluoroscopic guidance, the existing 22 French gastrojejunostomy catheter was exchanged for Aireanna Luellen new 24 [place] gastrojejunostomy catheter with the distal approximately 10 cm trimmed with tip ultimately terminating within the proximal small bowel. The retention balloon was insufflated with saline Sabrine Patchen small amount of contrast within the gastric lumen and the external disc was cinched. Contrast injection via the jejunostomy and gastric lumens confirmed appropriate functioning and positioning. Aarib Pulido dressing was applied. The patient tolerated procedure well without immediate postprocedural complication. IMPRESSION: Successful fluoroscopic guided replacement and up sizing now 24 French gastrojejunostomy catheter (with the distal approximately 10 cm trimmed). The tip of the jejunostomy lumen lies within the proximal jejunum. Both lumens ready for immediate use. Electronically Signed   By: Sandi Mariscal M.D.   On: 03/05/2021 07:58        Scheduled Meds:  feeding supplement (PROSource TF)  45 mL Per Tube Daily   free water  120 mL Per Tube Q6H   scopolamine  1 patch Transdermal Q72H   Continuous Infusions:  dextrose 5 % and 0.9% NaCl 75 mL/hr at 03/05/21 1330   feeding supplement (OSMOLITE 1.2 CAL) 1,000 mL (03/05/21 1045)     LOS: 3 days    Time spent: over 30 min    Fayrene Helper, MD [facility]   To contact the attending provider between 7A-7P or the covering provider during after hours 7P-7A, please log into the web site [email or web address] and access using universal [place] password for that web site. If you do not have the password, please call the hospital operator.  03/05/2021, 6:49 PM

## 2021-03-05 NOTE — Progress Notes (Signed)
PT Cancellation Note  Patient Details Name: Kathryn [name] MRN: [id number] DOB: 1940/08/31   Cancelled Treatment:    Reason Eval/Treat Not Completed: Other (comment).  Despite encouragement of the goals of PT, pt refused therapy session.  Reporting she has had diarrhea all day, and is not willing even to do bed level session.  Follow up as time and pt allow.   Ivar Drape 03/05/2021, 4:39 PM  Samul Dada, PT MS Acute Rehab Dept. Number: [facility] [id number] and [facility] [phone]

## 2021-03-05 NOTE — NC FL2 (Signed)
[place] MEDICAID FL2 LEVEL OF CARE SCREENING TOOL     IDENTIFICATION  Patient Name: Kathryn [name] Birthdate: 1940/11/02 Sex: female Admission Date (Current Location): 03/01/2021  [facility] and [place] Number:  Producer, television/film/video and Address:  [facility] [place], [address], [place], [place] [address]      Provider Number: [id number]  Attending Physician Name and Address:  Zigmund Daniel., *  Relative Name and Phone Number:  Pickett,Winnifred (Daughter)   [phone]    Current Level of Care: Hospital Recommended Level of Care: Skilled Nursing Facility Prior Approval Number:    Date Approved/Denied: 03/05/21 PASRR Number: [id number]  Discharge Plan: SNF    Current Diagnoses: Patient Active Problem List   Diagnosis Date Noted   PEG tube malfunction (HCC) 03/02/2021   Protein-calorie malnutrition, severe 03/02/2021   Type 2 diabetes mellitus (HCC) 03/01/2021   Chronic hyponatremia 03/01/2021   Encounter for gastrojejunal (GJ) tube placement 03/01/2021   Positive ANA (antinuclear antibody) 02/23/2021   Hx SBO 02/23/2021   Gastrojejunostomy tube status (HCC) 02/19/2021   Pressure injury of skin 01/28/2021   Constipation    Dislodged gastrostomy tube    Hypoactive bowel sounds    Abdominal pain 01/14/2021   Generalized weakness 01/14/2021   Dehydration 01/14/2021   Normocytic anemia 01/14/2021   Leukocytosis 01/14/2021   Hyperglycemia 01/14/2021   Hypoalbuminemia due to protein-calorie malnutrition (HCC) 01/14/2021   Prolonged QT interval 01/14/2021   Malnutrition of moderate degree 01/14/2021   Achalasia    Nausea and vomiting 01/13/2021   Colitis 12/25/2020   COVID-19 virus infection 12/25/2020   Loss of weight    Dysphagia    Early satiety    UTI (urinary tract infection) 10/23/2020   Elevated d-dimer 10/23/2020   Tachycardia 10/23/2020   Abnormal computed tomography of esophagus 10/23/2020   Mild protein-calorie  malnutrition (HCC) 10/23/2020   Rhabdomyolysis 10/23/2020   Hypertension associated with diabetes (HCC) 10/23/2020   Sensorineural hearing loss (SNHL), bilateral 03/02/2016   Family history of colon cancer 04/27/2015   Family history of thyroid cancer 04/27/2015   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   Conjunctivochalasis of both eyes 12/04/2014   Nuclear sclerosis of both eyes 12/04/2014   H/O rotator cuff syndrome 07/12/2012   Rotator cuff tendinitis 07/12/2012   SHOULDER PAIN 10/30/2007    Orientation RESPIRATION BLADDER Height & Weight     Self, Time, Situation, Place    Incontinent Weight:   Height:  5\' 4"  (162.6 cm)  BEHAVIORAL SYMPTOMS/MOOD NEUROLOGICAL BOWEL NUTRITION STATUS      Incontinent Feeding tube  AMBULATORY STATUS COMMUNICATION OF NEEDS Skin   Limited Assist Non-Verbally Surgical wounds                       Personal Care Assistance Level of Assistance  Bathing, Dressing Bathing Assistance: Limited assistance Feeding assistance: Limited assistance Dressing Assistance: Limited assistance     Functional Limitations Info  Hearing, Sight, Speech Sight Info: Impaired Hearing Info: Impaired Speech Info: Adequate    SPECIAL CARE FACTORS FREQUENCY  PT (By licensed PT), OT (By licensed OT)     PT Frequency: 5x/ week OT Frequency: 5x/ week            Contractures Contractures Info: Not present    Additional Factors Info  Allergies, Code Status Code Status Info: DNR Allergies Info: Cyclobenzaprine Hcl           Current Medications (03/05/2021):  This is  the current hospital active medication list Current Facility-Administered Medications  Medication Dose Route Frequency Provider Last Rate Last Admin   acetaminophen (TYLENOL) tablet 650 mg  650 mg Per Tube Q6H PRN Charlsie Quest, MD       Or   acetaminophen (TYLENOL) suppository 650 mg  650 mg Rectal Q6H PRN Darreld Mclean R, MD       dextrose 5 %-0.9 % sodium chloride infusion   Intravenous  Continuous [place], Eric J, DO 75 mL/hr at 03/04/21 1415 New Bag at 03/04/21 1415   feeding supplement (OSMOLITE 1.2 CAL) liquid 1,000 mL  1,000 mL Per Tube Continuous Zigmund Daniel., MD 60 mL/hr at 03/05/21 1045 1,000 mL at 03/05/21 1045   feeding supplement (PROSource TF) liquid 45 mL  45 mL Per Tube Daily Zigmund Daniel., MD   45 mL at 03/05/21 1045   free water 120 mL  120 mL Per Tube Q6H Zigmund Daniel., MD   120 mL at 03/05/21 1211   ondansetron (ZOFRAN) tablet 4 mg  4 mg Per Tube Q6H PRN Charlsie Quest, MD       Or   ondansetron (ZOFRAN) injection 4 mg  4 mg Intravenous Q6H PRN Charlsie Quest, MD       scopolamine (TRANSDERM-SCOP) 1 MG/3DAYS 1.5 mg  1 patch Transdermal Q72H Darreld Mclean R, MD   1.5 mg at 03/04/21 2100     Discharge Medications: Please see discharge summary for a list of discharge medications.  Relevant Imaging Results:  Relevant Lab Results:   Additional Information Pt SSN [id number];  Moderna COVID-19 Vaccine 07/21/2020 , 09/05/2019 , 08/08/2019  Ralene Bathe, LCSWA

## 2021-03-06 ENCOUNTER — Inpatient Hospital Stay (HOSPITAL_COMMUNITY): Payer: Medicare Other

## 2021-03-06 LAB — CBC WITH DIFFERENTIAL/PLATELET
Abs Immature Granulocytes: 0.03 10*3/uL (ref 0.00–0.07)
Basophils Absolute: 0 10*3/uL (ref 0.0–0.1)
Basophils Relative: 0 %
Eosinophils Absolute: 0 10*3/uL (ref 0.0–0.5)
Eosinophils Relative: 1 %
HCT: 29.4 % — ABNORMAL LOW (ref 36.0–46.0)
Hemoglobin: 9.5 g/dL — ABNORMAL LOW (ref 12.0–15.0)
Immature Granulocytes: 1 %
Lymphocytes Relative: 25 %
Lymphs Abs: 1.4 10*3/uL (ref 0.7–4.0)
MCH: 31 pg (ref 26.0–34.0)
MCHC: 32.3 g/dL (ref 30.0–36.0)
MCV: 96.1 fL (ref 80.0–100.0)
Monocytes Absolute: 0.4 10*3/uL (ref 0.1–1.0)
Monocytes Relative: 6 %
Neutro Abs: 3.9 10*3/uL (ref 1.7–7.7)
Neutrophils Relative %: 67 %
Platelets: 269 10*3/uL (ref 150–400)
RBC: 3.06 MIL/uL — ABNORMAL LOW (ref 3.87–5.11)
RDW: 15.5 % (ref 11.5–15.5)
WBC: 5.7 10*3/uL (ref 4.0–10.5)
nRBC: 0 % (ref 0.0–0.2)

## 2021-03-06 LAB — COMPREHENSIVE METABOLIC PANEL
ALT: 13 U/L (ref 0–44)
AST: 19 U/L (ref 15–41)
Albumin: 2.2 g/dL — ABNORMAL LOW (ref 3.5–5.0)
Alkaline Phosphatase: 73 U/L (ref 38–126)
Anion gap: 3 — ABNORMAL LOW (ref 5–15)
BUN: 6 mg/dL — ABNORMAL LOW (ref 8–23)
CO2: 25 mmol/L (ref 22–32)
Calcium: 8.3 mg/dL — ABNORMAL LOW (ref 8.9–10.3)
Chloride: 102 mmol/L (ref 98–111)
Creatinine, Ser: <0.3 mg/dL — ABNORMAL LOW (ref 0.44–1.00)
Glucose, Bld: 115 mg/dL — ABNORMAL HIGH (ref 70–99)
Potassium: 4.7 mmol/L (ref 3.5–5.1)
Sodium: 130 mmol/L — ABNORMAL LOW (ref 135–145)
Total Bilirubin: 0.4 mg/dL (ref 0.3–1.2)
Total Protein: 5.7 g/dL — ABNORMAL LOW (ref 6.5–8.1)

## 2021-03-06 LAB — GLUCOSE, CAPILLARY
Glucose-Capillary: 113 mg/dL — ABNORMAL HIGH (ref 70–99)
Glucose-Capillary: 120 mg/dL — ABNORMAL HIGH (ref 70–99)
Glucose-Capillary: 125 mg/dL — ABNORMAL HIGH (ref 70–99)
Glucose-Capillary: 137 mg/dL — ABNORMAL HIGH (ref 70–99)
Glucose-Capillary: 139 mg/dL — ABNORMAL HIGH (ref 70–99)
Glucose-Capillary: 144 mg/dL — ABNORMAL HIGH (ref 70–99)

## 2021-03-06 NOTE — Progress Notes (Signed)
PROGRESS NOTE    Kathryn [name]  XBW:[id number] DOB: 1940/10/05 DOA: 03/01/2021 PCP: Leeanne Rio, MD    No chief complaint on file.   Brief Narrative:  Kathryn [name] is Kathryn [name] 80 y.o. female with medical history significant for esophageal dysphagia/achalasia s/p G/J-tube, chronic hyponatremia, type 2 diabetes, hypertension, anemia, positive SSA and ANA (for which Sjogren's syndrome and SLE are on differential) who presented to [facility] ED for evaluation of GJ tube dysfunction.   Patient recently admitted 01/13/2021-02/06/2021 for epigastric/abdominal pain with nausea and vomiting in the setting of esophageal dysphagia.  She underwent G-tube placement on 7/28 with subsequent dislodgment, replacement, and finally GJ tube conversion by IR on 8/8.  She was seen in the ED 8/21 for blocked feeding tube which was cleaned and flushed easily. She saw general surgery in follow-up on 8/25.  Tube was clogged on exam and subsequently flushed with evacuation of feeds from the lumen with saline.  Patient was advised to flush the jejunal port every 4 hours and after feeds stopped or held.   Patient returned to the ED from SNF due to persistent clogging of the GJ tube.  Per daughter, she has not been able to receive any feeds last 24 hours.  Patient is having some mild abdominal discomfort around the G-tube insertion site and excessive oral secretions but denies any significant nausea, vomiting.  She denies any chest pain, dyspnea, dysuria.   [facility] course:  BP 92/63, pulse 93, RR 16, temp 98.4 F, SPO2 94% on room air. Labs showed WBC 6.2, hemoglobin 9.6, platelets 313,000, sodium 122. Abdominal x-ray showed gastrojejunostomy catheter likely partially withdrawn with the retaining balloon inflated within the tract of the abdominal wall.  Gastric lumen and jejunal lumen appropriately positioned.  Normal abdominal gas pattern. Transfer was requested to [facility] for IR consultative  services.  Assessment & Plan:   Principal Problem:   Gastrojejunostomy tube status ([place]) Active Problems:   Hypertension associated with diabetes ([place])   Type 2 diabetes mellitus ([place])   Chronic hyponatremia   PEG tube malfunction (HCC)   Protein-calorie malnutrition, severe  Esophageal dysphagia/achalasia s/p GJ tube with concern for tube dysfunction/placement: GJ tube placed by IR 02/02/2021.  Patient having repeat issues of clogging, now sent from [facility] ED due to concern for displacement.  Abdominal x-ray at [facility] with gastrojejunostomy catheter likely partially withdrawn within the retaining balloon inflated within the track of the abdominal wall.  She is having some mild discomfort around tube insertion site. - PEG tube location plain film 9/7 with GJ tube, intragastric position of the balloon cannot be confirmed on this exam due to insufficient contrast around balloon -> recommended direct observation under fluoroscopic injection of contrast --N.p.o., hold tube feeds for now -> resume per IR --Continue D5NS at 75 mL/hour  --s/p fluorscopic guided replacement and upsizing GJ catheter, ready for use (9/7)   COVID 19 Virus Infection Tested positive 6/30 - mild symptoms, weakness and fatigue Tested negative 8/11 here at [facility] 02/13/21 -> negative rapid antigen test (per discussion with [facility]) 02/20/21 -> positive PCR (per discussion with [facility]) With positive test within 3 months of initial and no clear symptoms (as well as recent positives within past several weeks at [facility]), I don't think she needs to remain quarantined at this time   Chronic hyponatremia: Sodium 117 on 9/2 and 122 on 9/4 prior to arrival.  Recent baseline appears to be around 130. --now improved,  follow with resumption of tube feeds   Type 2 diabetes: A1c 5.8% on 12/26/2020.  On insulin sliding scale outpatient. --Resume SSI    Hypertension: - BP reasonable today --resume  metoprolol 12.5 mg PO BID --resume aspirin --continue to monitor BP closely   Excessive secretions: Continue scopolamine patch. Noted today, seems like this is chronic per discussion CXR without acute cardiopulm dz Will discuss with daughter   Anemia of chronic disease: Stable, continue monitor.   Positive SSA and ANA: Differential including Sjogren's syndrome and SLE.  Seen by rheumatology, Dr. Benjamine Mola on 02/23/2021. Repeat labs next visit showed positive ANA with ANA titer 1:320 and homogenous and speckled pattern.  ESR 86, aldolase 3.6, CK 76.  Continue to follow with rheumatology outpatient.  DVT prophylaxis: SCD Code Status:DNR Family Communication: daughter over phone 9/7 Disposition:   Status is: Inpatient  Remains inpatient appropriate because:Inpatient level of care appropriate due to severity of illness  Dispo:  Patient From: [place]  Planned Disposition: [place]  Medically stable for discharge: No         Consultants:  IR  Procedures:  Pending note from IR  Antimicrobials: Anti-infectives (From admission, onward)    None          Subjective: No complaints, notes more secretions today  Objective: Vitals:   03/05/21 2200 03/06/21 0807 03/06/21 0900 03/06/21 1119  BP: (!) 129/55 131/62 136/68 (!) 131/50  Pulse:  89 89 98  Resp: 18 18  17   Temp: 98.8 F (37.1 C) 98.5 F (36.9 C)  98.4 F (36.9 C)  TempSrc: Oral Oral    SpO2: 100% 100% 95% 100%  Weight:      Height:        Intake/Output Summary (Last 24 hours) at 03/06/2021 1456 Last data filed at 03/05/2021 2003 Gross per 24 hour  Intake 3880.12 ml  Output --  Net 3880.12 ml   Filed Weights   03/05/21 1606  Weight: 46.2 kg    Examination:  General: No acute distress. Cardiovascular: RRR Lungs: unlabored Abdomen: Soft, nontender, nondistended - GJ Neurological: Alert and oriented 3. Moves all extremities 4 . Cranial nerves II through XII grossly  intact. Skin: Warm and dry. No rashes or lesions. Extremities: No clubbing or cyanosis. No edema.     Data Reviewed: I have personally reviewed following labs and imaging studies  CBC: Recent Labs  Lab 03/02/21 0122 03/05/21 0131 03/06/21 1058  WBC 4.9 4.1 5.7  NEUTROABS  --  2.2 3.9  HGB 9.8* 9.5* 9.5*  HCT 30.0* 29.7* 29.4*  MCV 94.9 95.8 96.1  PLT 370 283 161    Basic Metabolic Panel: Recent Labs  Lab 03/02/21 0122 03/03/21 0241 03/04/21 0026 03/05/21 0131 03/06/21 1058  NA 127* 130* 132* 132* 130*  K 4.0 4.1 3.9 3.2* 4.7  CL 98 100 103 105 102  CO2 22 25 24 24 25   GLUCOSE 104* 108* 82 113* 115*  BUN <5* <5* <5* <5* 6*  CREATININE 0.34* 0.41* 0.35* 0.31* <0.30*  CALCIUM 8.8* 8.6* 8.7* 8.5* 8.3*  MG  --   --   --  1.6*  --   PHOS  --   --   --  3.0  --     GFR: CrCl cannot be calculated (This lab value cannot be used to calculate CrCl because it is not Sheetal Lyall number: <0.30).  Liver Function Tests: Recent Labs  Lab 03/05/21 0131 03/06/21 1058  AST 22 19  ALT  17 13  ALKPHOS 77 73  BILITOT 0.5 0.4  PROT 5.6* 5.7*  ALBUMIN 2.1* 2.2*    CBG: Recent Labs  Lab 03/05/21 1611 03/05/21 2118 03/06/21 0014 03/06/21 0737 03/06/21 1115  GLUCAP 121* 122* 144* 137* 120*     Recent Results (from the past 240 hour(s))  SARS CORONAVIRUS 2 (TAT 6-24 HRS) Nasopharyngeal Nasopharyngeal Swab     Status: Abnormal   Collection Time: 03/02/21  2:12 AM   Specimen: Nasopharyngeal Swab  Result Value Ref Range Status   SARS Coronavirus 2 POSITIVE (Mohmmad Saleeby) NEGATIVE Final    Comment: (NOTE) SARS-CoV-2 target nucleic acids are DETECTED.  The SARS-CoV-2 RNA is generally detectable in upper and lower respiratory specimens during the acute phase of infection. Positive results are indicative of the presence of SARS-CoV-2 RNA. Clinical correlation with patient history and other diagnostic information is  necessary to determine patient infection status. Positive results do not  rule out bacterial infection or co-infection with other viruses.  The expected result is Negative.  Fact Sheet for Patients: SugarRoll.be  Fact Sheet for Healthcare Providers: https://www.woods-mathews.com/  This test is not yet approved or cleared by the [place] FDA and  has been authorized for detection and/or diagnosis of SARS-CoV-2 by FDA under an Emergency Use Authorization (EUA). This EUA will remain  in effect (meaning this test can be used) for the duration of the COVID-19 declaration under Section 564(b)(1) of the Act, 21 U. S.C. section 360bbb-3(b)(1), unless the authorization is terminated or revoked sooner.   Performed at [facility], [place] [address], [place], [place] [address]          Radiology Studies: IR GJ Tube Change  Result Date: 03/05/2021 INDICATION: Leaking and clogged feeding gastrojejunostomy catheter. EXAM: FLUOROSCOPIC GUIDED REPLACEMENT OF GASTROJEJUNOSTOMY TUBE COMPARISON:  Fluoroscopic guided conversion of surgical gastrostomy tube to Monserat Prestigiacomo feeding gastrojejunostomy tube-02/02/2021 MEDICATIONS: None. CONTRAST:  20 cc Omnipaque 300, administered into the stomach and proximal small bowel FLUOROSCOPY TIME:  1 minute COMPLICATIONS: None. PROCEDURE: Informed written consent was obtained from the patient after Lotoya Casella discussion of the risks and benefits. The upper abdomen and the external portion of the existing gastrojejunostomy tube was prepped and draped in the usual sterile fashion, and Trevione Wert sterile drape was applied covering the operative field. Maximum barrier sterile technique with sterile gowns and gloves were used for the procedure. Pearly Apachito timeout was performed prior to the initiation of the procedure. Attempt was made to vigorously inject the jejunal lumen however this proved unsuccessful secondary to the catheter occlusion. The external portion of the balloon retention gastrostomy tube was cut and the balloon was  deflated. Kunta Hilleary gastrojejunostomy catheter was partially retracted and jejunal catheter was cut and cannulated with Layken Beg stiff Glidewire which was advanced through the entirety of the jejunal lumen to the level of the proximal small bowel. Under fluoroscopic guidance, the existing 22 French gastrojejunostomy catheter was exchanged for Sahiti Joswick new 24 [place] gastrojejunostomy catheter with the distal approximately 10 cm trimmed with tip ultimately terminating within the proximal small bowel. The retention balloon was insufflated with saline Nikkia Devoss small amount of contrast within the gastric lumen and the external disc was cinched. Contrast injection via the jejunostomy and gastric lumens confirmed appropriate functioning and positioning. Tavita Eastham dressing was applied. The patient tolerated procedure well without immediate postprocedural complication. IMPRESSION: Successful fluoroscopic guided replacement and up sizing now 24 French gastrojejunostomy catheter (with the distal approximately 10 cm trimmed). The tip of the jejunostomy lumen lies within the proximal  jejunum. Both lumens ready for immediate use. Electronically Signed   By: Sandi Mariscal M.D.   On: 03/05/2021 07:58   DG CHEST PORT 1 VIEW  Result Date: 03/06/2021 CLINICAL DATA:  Cough. EXAM: PORTABLE CHEST 1 VIEW COMPARISON:  02/12/2021 FINDINGS: Stable linear densities in the right hilar region are suggestive for scarring. Otherwise, the lungs are clear. Heart size is normal. Atherosclerotic calcifications at the aortic arch. There is evidence for retained oral contrast in the gastric fundus region. Patient has Krysten Veronica GJ feeding tube that is partially imaged. IMPRESSION: No acute cardiopulmonary disease. Electronically Signed   By: Markus Daft M.D.   On: 03/06/2021 12:29        Scheduled Meds:  aspirin  81 mg Per Tube Daily   feeding supplement (PROSource TF)  45 mL Per Tube Daily   free water  120 mL Per Tube Q6H   insulin aspart  0-5 Units Subcutaneous QHS   insulin aspart   0-9 Units Subcutaneous TID WC   metoprolol tartrate  12.5 mg Per Tube BID   pantoprazole sodium  40 mg Per Tube Daily   scopolamine  1 patch Transdermal Q72H   Continuous Infusions:  feeding supplement (OSMOLITE 1.2 CAL) 1,000 mL (03/05/21 1045)     LOS: 4 days    Time spent: over 30 min    Fayrene Helper, MD [facility]   To contact the attending provider between 7A-7P or the covering provider during after hours 7P-7A, please log into the web site [email or web address] and access using universal [place] password for that web site. If you do not have the password, please call the hospital operator.  03/06/2021, 2:56 PM

## 2021-03-06 NOTE — Progress Notes (Signed)
Occupational Therapy Treatment Patient Details Name: Kathryn [name] MRN: [id number] DOB: [birth year] Today's Date: 03/06/2021    History of present illness Pt is an 80 y/o female admitted from SNF on 9/4 secondary to GJ tube malfunction. PMH includes DM, HTN, dysphagia/achalasia s/p GJ tube.   OT comments  Pt progressing towards acute OT goals. Able to sit EOB a few minutes though noted to visibly fatigue. Gave back support for a seated rest break then pt able to squat-pivot transfer to recliner with +2 mod A. Pt c/o feeling woozy in upright position, better once reclined a bit in chair. VSS. D/c plan remains appropriate.    Follow Up Recommendations  SNF;Supervision/Assistance - 24 hour    Equipment Recommendations  None recommended by OT    Recommendations for Other Services      Precautions / Restrictions Precautions Precautions: Fall Precaution Comments: G tube Restrictions Weight Bearing Restrictions: No Other Position/Activity Restrictions: GJ tube       Mobility Bed Mobility Overal bed mobility: Needs Assistance Bed Mobility: Rolling;Sidelying to Sit Rolling: Min assist Sidelying to sit: Mod assist       General bed mobility comments: assist to power fully to sidelying. assist for trunk elevation    Transfers Overall transfer level: Needs assistance Equipment used: 2 person hand held assist Transfers: Squat Pivot Transfers     Squat pivot transfers: Mod assist;+2 physical assistance;+2 safety/equipment     General transfer comment: Pt completed squat-pivot from EOB to recliner. +2 assist utilized as it has been a few days since pt has been able to get OOB and was feeling woozy sitting EOB.    Balance Overall balance assessment: History of Falls;Needs assistance                                         ADL either performed or assessed with clinical judgement   ADL Overall ADL's : Needs assistance/impaired                          Toilet Transfer: Moderate assistance;+2 for physical assistance;Squat-pivot Toilet Transfer Details (indicate cue type and reason): simulated with EOB to recliner. +2 mod A to squat-pivot           General ADL Comments: Pt completed bed mobility, sat EOB several minutes. Began feeling woozy while sitting EOB but able to continue. Vitals assesed EOB and were stable. Pt then transferred into recliner with +2 assist     Vision       Perception     Praxis      Cognition Arousal/Alertness: Awake/alert Behavior During Therapy: WFL for tasks assessed/performed Overall Cognitive Status: Within Functional Limits for tasks assessed                                          Exercises     Shoulder Instructions       General Comments Pt verbalized fear of falling.    Pertinent Vitals/ Pain       Pain Assessment: No/denies pain  Home Living  Prior Functioning/Environment              Frequency  Min 2X/week        Progress Toward Goals  OT Goals(current goals can now be found in the care plan section)  Progress towards OT goals: Progressing toward goals  Acute Rehab OT Goals Patient Stated Goal: to walk again OT Goal Formulation: With patient Time For Goal Achievement: 03/16/21 Potential to Achieve Goals: Fair ADL Goals Pt Will Perform Grooming: with set-up;sitting Pt Will Perform Upper Body Bathing: with set-up;sitting Pt Will Perform Lower Body Bathing: with min assist;sitting/lateral leans Pt Will Perform Upper Body Dressing: with set-up;sitting Pt Will Perform Lower Body Dressing: with mod assist;sit to/from stand Pt Will Transfer to Toilet: with min assist;stand pivot transfer;bedside commode Pt Will Perform Toileting - Clothing Manipulation and hygiene: with min assist;sitting/lateral leans Pt/caregiver will Perform Home Exercise Program: Increased ROM;Increased strength;Both  right and left upper extremity;With written HEP provided  Plan Discharge plan remains appropriate    Co-evaluation                 AM-PAC OT "6 Clicks" Daily Activity     Outcome Measure   Help from another person eating meals?: Total Help from another person taking care of personal grooming?: A Lot Help from another person toileting, which includes using toliet, bedpan, or urinal?: A Lot Help from another person bathing (including washing, rinsing, drying)?: A Lot Help from another person to put on and taking off regular upper body clothing?: A Lot Help from another person to put on and taking off regular lower body clothing?: A Lot 6 Click Score: 11    End of Session    OT Visit Diagnosis: Unsteadiness on feet (R26.81);Other abnormalities of gait and mobility (R26.89);History of falling (Z91.81);Muscle weakness (generalized) (M62.81);Pain   Activity Tolerance Other (comment) (felt woozy sitting upright, better once reclined a bit in recliner)   Patient Left in chair;with call bell/phone within reach;with chair alarm set   Nurse Communication Mobility status;Other (comment) (woozy sitting upright, better once reclined. VSS.)        Time: 3888-7579 OT Time Calculation (min): 30 min  Charges: OT General Charges $OT Visit: 1 Visit OT Treatments $Self Care/Home Management : 23-37 mins  Raynald Kemp, OT Acute Rehabilitation Services Pager: [phone] Office: [phone]    Pilar Grammes 03/06/2021, 2:38 PM

## 2021-03-06 NOTE — TOC Progression Note (Addendum)
Transition of Care [facility]) - Initial/Assessment Note    Patient Details  Name: Kathryn [name] MRN: [id number] Date of Birth: 12/24/1940  [facility] CM/SW Contact:    Ralene Bathe, LCSWA Phone Number: 03/06/2021, 12:07 PM  Clinical Narrative:                 CSW spoke with Revonda Standard at [facility] [place].  Insurance Berkley Harvey has been received, 9/9 - 9/13, next review 9/13.  Auth ID: [id number].    The patient can return to the facility tomorrow if medically ready.  CSW called the patient's daughter and updated her on the d/c plans.       Patient Goals and CMS Choice        Expected Discharge Plan and Services                                                Prior Living Arrangements/Services                       Activities of Daily Living Home Assistive Devices/Equipment: Wheelchair ADL Screening (condition at time of admission) Patient's cognitive ability adequate to safely complete daily activities?: Yes Is the patient deaf or have difficulty hearing?: No Does the patient have difficulty seeing, even when wearing glasses/contacts?: Yes Does the patient have difficulty concentrating, remembering, or making decisions?: No Patient able to express need for assistance with ADLs?: Yes Does the patient have difficulty dressing or bathing?: Yes Independently performs ADLs?: No Communication: Independent Dressing (OT): Dependent Is this a change from baseline?: Pre-admission baseline Grooming: Dependent Is this a change from baseline?: Pre-admission baseline Feeding: Dependent Is this a change from baseline?: Pre-admission baseline Bathing: Dependent Is this a change from baseline?: Pre-admission baseline Toileting: Dependent Is this a change from baseline?: Pre-admission baseline Walks in Home: Dependent Is this a change from baseline?: Pre-admission baseline Does the patient have difficulty walking or climbing stairs?: Yes Weakness of  Legs: Both Weakness of Arms/Hands: Both  Permission Sought/Granted                  Emotional Assessment              Admission diagnosis:  Post-operative complication [T81.9XXA] Encounter for gastrojejunal (GJ) tube placement [Z78.9] PEG tube malfunction (HCC) [K94.23] Patient Active Problem List   Diagnosis Date Noted   PEG tube malfunction (HCC) 03/02/2021   Protein-calorie malnutrition, severe 03/02/2021   Type 2 diabetes mellitus (HCC) 03/01/2021   Chronic hyponatremia 03/01/2021   Encounter for gastrojejunal (GJ) tube placement 03/01/2021   Positive ANA (antinuclear antibody) 02/23/2021   Hx SBO 02/23/2021   Gastrojejunostomy tube status (HCC) 02/19/2021   Pressure injury of skin 01/28/2021   Constipation    Dislodged gastrostomy tube    Hypoactive bowel sounds    Abdominal pain 01/14/2021   Generalized weakness 01/14/2021   Dehydration 01/14/2021   Normocytic anemia 01/14/2021   Leukocytosis 01/14/2021   Hyperglycemia 01/14/2021   Hypoalbuminemia due to protein-calorie malnutrition (HCC) 01/14/2021   Prolonged QT interval 01/14/2021   Malnutrition of moderate degree 01/14/2021   Achalasia    Nausea and vomiting 01/13/2021   Colitis 12/25/2020   COVID-19 virus infection 12/25/2020   Loss of weight    Dysphagia    Early satiety    UTI (urinary tract infection)  10/23/2020   Elevated d-dimer 10/23/2020   Tachycardia 10/23/2020   Abnormal computed tomography of esophagus 10/23/2020   Mild protein-calorie malnutrition (HCC) 10/23/2020   Rhabdomyolysis 10/23/2020   Hypertension associated with diabetes (HCC) 10/23/2020   Sensorineural hearing loss (SNHL), bilateral 03/02/2016   Family history of colon cancer 04/27/2015   Family history of thyroid cancer 04/27/2015   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   Conjunctivochalasis of both eyes 12/04/2014   Nuclear sclerosis of both eyes 12/04/2014   H/O rotator cuff syndrome 07/12/2012   Rotator cuff tendinitis  07/12/2012   SHOULDER PAIN 10/30/2007   PCP:  Suzan Slick, MD Pharmacy:   [facility] - [place], [place] - [address] [place] [address] Phone: [phone] Fax: [phone]     Social Determinants of Health (SDOH) Interventions    Readmission Risk Interventions Readmission Risk Prevention Plan 02/03/2021  Transportation Screening Complete  PCP or Specialist Appt within 3-5 Days Complete  HRI or Home Care Consult Complete  Social Work Consult for Recovery Care Planning/Counseling Complete  Palliative Care Screening Not Applicable  Medication Review Oceanographer) Complete  Some recent data might be hidden

## 2021-03-06 NOTE — Care Management Important Message (Signed)
Important Message  Patient Details  Name: Kathryn [name] MRN: [id number] Date of Birth: Apr 10, 1941   Medicare Important Message Given:  Yes     Dorena Bodo 03/06/2021, 4:23 PM

## 2021-03-07 LAB — CBC WITH DIFFERENTIAL/PLATELET
Abs Immature Granulocytes: 0.04 10*3/uL (ref 0.00–0.07)
Basophils Absolute: 0 10*3/uL (ref 0.0–0.1)
Basophils Relative: 0 %
Eosinophils Absolute: 0.1 10*3/uL (ref 0.0–0.5)
Eosinophils Relative: 2 %
HCT: 29.1 % — ABNORMAL LOW (ref 36.0–46.0)
Hemoglobin: 9.3 g/dL — ABNORMAL LOW (ref 12.0–15.0)
Immature Granulocytes: 1 %
Lymphocytes Relative: 28 %
Lymphs Abs: 1.3 10*3/uL (ref 0.7–4.0)
MCH: 30.7 pg (ref 26.0–34.0)
MCHC: 32 g/dL (ref 30.0–36.0)
MCV: 96 fL (ref 80.0–100.0)
Monocytes Absolute: 0.3 10*3/uL (ref 0.1–1.0)
Monocytes Relative: 7 %
Neutro Abs: 2.9 10*3/uL (ref 1.7–7.7)
Neutrophils Relative %: 62 %
Platelets: 263 10*3/uL (ref 150–400)
RBC: 3.03 MIL/uL — ABNORMAL LOW (ref 3.87–5.11)
RDW: 15.6 % — ABNORMAL HIGH (ref 11.5–15.5)
WBC: 4.7 10*3/uL (ref 4.0–10.5)
nRBC: 0 % (ref 0.0–0.2)

## 2021-03-07 LAB — BASIC METABOLIC PANEL
Anion gap: 4 — ABNORMAL LOW (ref 5–15)
BUN: 6 mg/dL — ABNORMAL LOW (ref 8–23)
CO2: 25 mmol/L (ref 22–32)
Calcium: 8.4 mg/dL — ABNORMAL LOW (ref 8.9–10.3)
Chloride: 101 mmol/L (ref 98–111)
Creatinine, Ser: <0.3 mg/dL — ABNORMAL LOW (ref 0.44–1.00)
Glucose, Bld: 129 mg/dL — ABNORMAL HIGH (ref 70–99)
Potassium: 4.6 mmol/L (ref 3.5–5.1)
Sodium: 130 mmol/L — ABNORMAL LOW (ref 135–145)

## 2021-03-07 LAB — PHOSPHORUS: Phosphorus: 2.5 mg/dL (ref 2.5–4.6)

## 2021-03-07 LAB — GLUCOSE, CAPILLARY
Glucose-Capillary: 142 mg/dL — ABNORMAL HIGH (ref 70–99)
Glucose-Capillary: 123 mg/dL — ABNORMAL HIGH (ref 70–99)
Glucose-Capillary: 136 mg/dL — ABNORMAL HIGH (ref 70–99)
Glucose-Capillary: 87 mg/dL (ref 70–99)

## 2021-03-07 LAB — MAGNESIUM: Magnesium: 1.9 mg/dL (ref 1.7–2.4)

## 2021-03-07 MED ORDER — FREE WATER: 120.0000 mL | Freq: Four times a day (QID) | 2 refills | Status: DC

## 2021-03-07 MED ORDER — PROSOURCE TF PO LIQD: 45.0000 mL | Freq: Every day | ORAL | Status: DC

## 2021-03-07 MED ORDER — OSMOLITE 1.2 CAL PO LIQD: 1000.0000 mL | ORAL | 0 refills | Status: DC

## 2021-03-07 NOTE — Progress Notes (Signed)
Called facility and gave report to [facility], Pt denies pain, not in acute distress, discharged to facility with belongings via PTAR.

## 2021-03-07 NOTE — TOC Transition Note (Signed)
Transition of Care [facility]) - CM/SW Discharge Note   Patient Details  Name: Kathryn [name] MRN: [id number] Date of Birth: April 22, 1941  Transition of Care [facility]) CM/SW Contact:  Terrial Rhodes, LCSWA Phone Number: 03/07/2021, 12:25 PM   Clinical Narrative:     Patient will DC to: [facility]  Anticipated DC date: 03/07/2021  Family notified: Winnifred  Transport by: Sharin Mons  ?  Per MD patient ready for DC to [facility]  . RN, patient, patient's family, and facility notified of DC. Discharge Summary sent to facility. RN given number for report tele# [phone] RM# [phone] Bed 2. DC packet on chart. DNR signed by MD attached to patients hard chart.Ambulance transport requested for patient.  CSW signing off.   Final next level of care: Skilled Nursing Facility Barriers to Discharge: No Barriers Identified   Patient Goals and CMS Choice Patient states their goals for this hospitalization and ongoing recovery are:: to go to SNF CMS Medicare.gov Compare Post Acute Care list provided to:: Patient Represenative (must comment) (Winnifred) Choice offered to / list presented to : Adult Children (Winnifred)  Discharge Placement              Patient chooses bed at: [facility] Patient to be transferred to facility by: [facility] Name of family member notified: Winnifred Patient and family notified of of transfer: 03/07/21  Discharge Plan and Services                                     Social Determinants of Health (SDOH) Interventions     Readmission Risk Interventions Readmission Risk Prevention Plan 02/03/2021  Transportation Screening Complete  PCP or Specialist Appt within 3-5 Days Complete  HRI or Home Care Consult Complete  Social Work Consult for Recovery Care Planning/Counseling Complete  Palliative Care Screening Not Applicable  Medication Review [facility]) Complete  Some recent data might be hidden

## 2021-03-07 NOTE — Plan of Care (Signed)

## 2021-03-07 NOTE — TOC Progression Note (Signed)
Transition of Care [facility]) - Progression Note    Patient Details  Name: Kathryn [name] MRN: [id number] Date of Birth: 01-09-1941  Transition of Care [facility]) CM/SW Contact  Terrial Rhodes, LCSWA Phone Number: 03/07/2021, 10:01 AM  Clinical Narrative:      CSW spoke with Jill Side with [facility] who confirmed this patient has SNF bed when medically ready. Insurance authorization has been approved. CSW informed MD. CSW will continue to follow and assist with dc planning needs.      Expected Discharge Plan and Services                                                 Social Determinants of Health (SDOH) Interventions    Readmission Risk Interventions Readmission Risk Prevention Plan 02/03/2021  Transportation Screening Complete  PCP or Specialist Appt within 3-5 Days Complete  HRI or Home Care Consult Complete  Social Work Consult for Recovery Care Planning/Counseling Complete  Palliative Care Screening Not Applicable  Medication Review Oceanographer) Complete  Some recent data might be hidden

## 2021-03-07 NOTE — Discharge Summary (Addendum)
Physician Discharge Summary  Kathryn [name] JTT:[id number] DOB: April 19, 1941 DOA: 03/01/2021  PCP: Leeanne Rio, MD  Admit date: 03/01/2021 Discharge date: 03/07/2021  Time spent: 40 minutes  Recommendations for Outpatient Follow-up:  Follow outpatient CBC/CMP (attention to sodium) Follow with gastroenterology outpatient Follow with rheumatology outpatient for positive SSA and ANA Scopolamine patch d/c'd here given concern for this worsening dry mouth and thickening secretions - follow outpatient, can make adjustments depending on how she does with these changes  Continue tube feeds with osmolite 1.2 @ 60 ml/hr with 45 ml of prosource TF daily - flush with 120 ml free water flush every 6 hours  Discharge Diagnoses:  Principal Problem:   Gastrojejunostomy tube status ([place]) Active Problems:   Hypertension associated with diabetes ([place])   Type 2 diabetes mellitus ([place])   Chronic hyponatremia   PEG tube malfunction ([place])   Protein-calorie malnutrition, severe  Discharge Condition: stable  Diet recommendation: NPO  Filed Weights   03/05/21 1606 03/07/21 0349  Weight: 46.2 kg 48.1 kg    History of present illness:  Kathryn [name] is Kathryn [name] 80 y.o. female with medical history significant for esophageal dysphagia/achalasia s/p G/J-tube, chronic hyponatremia, type 2 diabetes, hypertension, anemia, positive SSA and ANA (for which Sjogren's syndrome and SLE are on differential) who presented to [facility] ED for evaluation of GJ tube dysfunction.   Patient recently admitted 01/13/2021-02/06/2021 for epigastric/abdominal pain with nausea and vomiting in the setting of esophageal dysphagia.  She underwent G-tube placement on 7/28 with subsequent dislodgment, replacement, and finally GJ tube conversion by IR on 8/8.  She was seen in the ED 8/21 for blocked feeding tube which was cleaned and flushed easily. She saw general surgery in follow-up on 8/25.  Tube was clogged on exam and subsequently  flushed with evacuation of feeds from the lumen with saline.  Patient was advised to flush the jejunal port every 4 hours and after feeds stopped or held.   Patient returned to the ED from SNF due to persistent clogging of the GJ tube.  Per daughter, she has not been able to receive any feeds last 24 hours.  Patient is having some mild abdominal discomfort around the G-tube insertion site and excessive oral secretions but denies any significant nausea, vomiting.  She denies any chest pain, dyspnea, dysuria.   [facility] course:  BP 92/63, pulse 93, RR 16, temp 98.4 F, SPO2 94% on room air. Labs showed WBC 6.2, hemoglobin 9.6, platelets 313,000, sodium 122. Abdominal x-ray showed gastrojejunostomy catheter likely partially withdrawn with the retaining balloon inflated within the tract of the abdominal wall.  Gastric lumen and jejunal lumen appropriately positioned.  Normal abdominal gas pattern. Transfer was requested to [facility] for IR consultative services.  Her GJ was replaced and upsized on 9/7.  Her tube feeds have been resumed and uptitrated to goal rate.    She's stable for discharge on  9/10.  See below for additional details   Hospital Course:  Esophageal dysphagia/achalasia s/p GJ tube with concern for tube dysfunction/placement: GJ tube placed by IR 02/02/2021.  Patient having repeat issues of clogging, now sent from [facility] ED due to concern for displacement.  [facility]dominal [facility] with gastrojejunostomy catheter likely partially withdrawn within the retaining balloon inflated within the track of the abdominal wall.  She is having some mild discomfort around tube insertion site. - PEG tube location plain film 9/7 with GJ tube, intragastric position of the balloon cannot  be confirmed on this exam due to insufficient contrast around balloon -> recommended direct observation under fluoroscopic injection of contrast --s/p fluorscopic guided replacement and  upsizing GJ catheter, ready for use (9/7).  She's tolerating tube feeds per RD at goal rate of 60 ml/hr.     COVID 19 Virus Infection Tested positive 6/30 - mild symptoms, weakness and fatigue Tested negative 8/11 here at [facility] 02/13/21 -> negative rapid antigen test (per discussion with [facility]) 02/20/21 -> positive PCR (per discussion with [facility]) With positive test within 3 months of initial and no clear symptoms (as well as recent positives within past several weeks at [facility]), I don't think she needs to remain quarantined at this time  Airborne and contact precautions discontinued   Chronic hyponatremia: Sodium 117 on 9/2 and 122 on 9/4 prior to arrival.  Recent baseline appears to be around 130. - 130 on 9/9 --now improved, follow with resumption of tube feeds   Type 2 diabetes: A1c 5.8% on 12/26/2020.  On insulin sliding scale outpatient. --Resume SSI  outpatient   Hypertension: - BP reasonable today --resume metoprolol 12.5 mg PO BID --resume aspirin --continue to monitor BP closely   Excessive secretions: seems like this is chronic per discussion (daughter notes since around April or so) - concerned that scopalamine patch may worsen dry mouth, thicken secretions.  Will stop scopalamine patch and follow outpatient.   Follow outpatient for additional workup and management CXR without acute cardiopulm dz   Anemia of chronic disease: Stable, continue monitor.   Positive SSA and ANA: Differential including Sjogren's syndrome and SLE.  Seen by rheumatology, Dr. Benjamine Mola on 02/23/2021. Repeat labs next visit showed positive ANA with ANA titer 1:320 and homogenous and speckled pattern.  ESR 86, aldolase 3.6, CK 76.  Continue to follow with rheumatology outpatient.  Procedures: fluorscopic guided replacement and upsizing GJ catheter, ready for use (9/7)  Consultations: IR  Discharge Exam: Vitals:   03/06/21 2041 03/07/21 0810  BP: 121/67 (!) 115/56  Pulse: 94 89   Resp: 18 18  Temp: 98.5 F (36.9 C) 98.5 F (36.9 C)  SpO2: 100% 100%   No complaints today  General: No acute distress. HEENT: some secretions noted, thick Cardiovascular: RRR Lungs: unlabored Abdomen: Soft, nontender, nondistended - GJ Neurological: Alert and oriented 3. Moves all extremities 4. Cranial nerves II through XII grossly intact. Skin: Warm and dry. No rashes or lesions. Extremities: No clubbing or cyanosis. No edema.   Discharge Instructions   Discharge Instructions     Call MD for:  difficulty breathing, headache or visual disturbances   Complete by: As directed    Call MD for:  extreme fatigue   Complete by: As directed    Call MD for:  hives   Complete by: As directed    Call MD for:  persistant dizziness or light-headedness   Complete by: As directed    Call MD for:  persistant nausea and vomiting   Complete by: As directed    Call MD for:  redness, tenderness, or signs of infection (pain, swelling, redness, odor or green/yellow discharge around incision site)   Complete by: As directed    Call MD for:  severe uncontrolled pain   Complete by: As directed    Call MD for:  temperature >100.4   Complete by: As directed    Diet NPO time specified   Complete by: As directed    Discharge instructions   Complete by: As directed  You were seen for GJ tube malfunction.  This was replaced and upsized by interventional radiology on 9/7.  Resume your continuous tube feeds with osmolite 1.2 at 60 ml an hour.  We've added prosource daily as Ivy Meriwether supplement.  You should continue to follow up with gastroenterology as an outpatient.  For your positive SSA and ANA, there's concern for an autoimmune condition or inflammatory condition and you should continue to follow up with rheumatology as well.  Stop the scopalamine patch.  This can cause dry mouth and sometimes can thicken secretions making them more difficult to manage.  Follow outpatient how you do with this  change and follow up with your PCP for additional adjustments.  Return for new, recurrent, or worsening symptoms.  Follow up with rheumatology (Dr. Benjamine Mola) for continued workup of your inflammatory or autoimmune condition.  Please ask your PCP to request records from this hospitalization so they know what was done and what the next steps will be.   Increase activity slowly   Complete by: As directed    Increase activity slowly   Complete by: As directed    No wound care   Complete by: As directed    No wound care   Complete by: As directed       Allergies as of 03/07/2021       Reactions   Cyclobenzaprine Hcl Swelling        Medication List     STOP taking these medications    HYDROcodone-acetaminophen 7.5-325 mg/15 ml solution Commonly known as: HYCET   scopolamine 1 MG/3DAYS Commonly known as: TRANSDERM-SCOP       TAKE these medications    acetaminophen 325 MG tablet Commonly known as: TYLENOL Place 2 tablets (650 mg total) into feeding tube every 6 (six) hours as needed for mild pain, fever or headache.   feeding supplement (OSMOLITE 1.2 CAL) Liqd Place 1,000 mLs into feeding tube continuous. What changed: Another medication with the same name was added. Make sure you understand how and when to take each.   feeding supplement (PROSource TF) liquid Place 45 mLs into feeding tube daily. Start taking on: March 08, 2021 What changed: You were already taking Neela Zecca medication with the same name, and this prescription was added. Make sure you understand how and when to take each.   free water Soln Place 120 mLs into feeding tube every 6 (six) hours. What changed:  how much to take when to take this   GoodSense Aspirin Low Dose 81 MG EC tablet Generic drug: aspirin Take 81 mg by mouth daily.   insulin lispro 100 UNIT/ML KwikPen Commonly known as: HumaLOG KwikPen Inject 0-10 Units into the skin 3 (three) times daily. insulin Humalog injection 0-10 Units 0-10  Units Subcutaneous, 3 times daily with meals CBG < 70: Implement Hypoglycemia Standing Orders and refer to Hypoglycemia Standing Orders sidebar report  CBG 70 - 120: 0 unit CBG 121 - 150: 0 unit  CBG 151 - 200: 1 unit CBG 201 - 250: 2 units CBG 251 - 300: 4 units CBG 301 - 350: 6 units  CBG 351 - 400: 8 units  CBG > 400: 10 units   lansoprazole 3 mg/ml Susp oral suspension Commonly known as: PREVACID Place 5 mLs (15 mg total) into feeding tube daily at 12 noon.   metoprolol tartrate 25 MG tablet Commonly known as: LOPRESSOR Take 0.5 tablets (12.5 mg total) by mouth 2 (two) times daily.   triamcinolone ointment 0.1 % Commonly  known as: KENALOG Apply topically.       Allergies  Allergen Reactions   Cyclobenzaprine Hcl Swelling      The results of significant diagnostics from this hospitalization (including imaging, microbiology, ancillary and laboratory) are listed below for reference.    Significant Diagnostic Studies: IR GJ Tube Change  Result Date: 03/05/2021 INDICATION: Leaking and clogged feeding gastrojejunostomy catheter. EXAM: FLUOROSCOPIC GUIDED REPLACEMENT OF GASTROJEJUNOSTOMY TUBE COMPARISON:  Fluoroscopic guided conversion of surgical gastrostomy tube to Kierra Jezewski feeding gastrojejunostomy tube-02/02/2021 MEDICATIONS: None. CONTRAST:  20 cc Omnipaque 300, administered into the stomach and proximal small bowel FLUOROSCOPY TIME:  1 minute COMPLICATIONS: None. PROCEDURE: Informed written consent was obtained from the patient after Zilla Shartzer discussion of the risks and benefits. The upper abdomen and the external portion of the existing gastrojejunostomy tube was prepped and draped in the usual sterile fashion, and Sumie Remsen sterile drape was applied covering the operative field. Maximum barrier sterile technique with sterile gowns and gloves were used for the procedure. Earlyne Feeser timeout was performed prior to the initiation of the procedure. Attempt was made to vigorously inject the jejunal lumen however this  proved unsuccessful secondary to the catheter occlusion. The external portion of the balloon retention gastrostomy tube was cut and the balloon was deflated. Jerami Tammen gastrojejunostomy catheter was partially retracted and jejunal catheter was cut and cannulated with Sian Rockers stiff Glidewire which was advanced through the entirety of the jejunal lumen to the level of the proximal small bowel. Under fluoroscopic guidance, the existing 22 French gastrojejunostomy catheter was exchanged for Stancil Deisher new 24 [place] gastrojejunostomy catheter with the distal approximately 10 cm trimmed with tip ultimately terminating within the proximal small bowel. The retention balloon was insufflated with saline Sabreena Vogan small amount of contrast within the gastric lumen and the external disc was cinched. Contrast injection via the jejunostomy and gastric lumens confirmed appropriate functioning and positioning. Jermayne Sweeney dressing was applied. The patient tolerated procedure well without immediate postprocedural complication. IMPRESSION: Successful fluoroscopic guided replacement and up sizing now 24 French gastrojejunostomy catheter (with the distal approximately 10 cm trimmed). The tip of the jejunostomy lumen lies within the proximal jejunum. Both lumens ready for immediate use. Electronically Signed   By: Sandi Mariscal M.D.   On: 03/05/2021 07:58   DG ABDOMEN PEG TUBE LOCATION  Result Date: 03/03/2021 CLINICAL DATA:  Peg tube malfunction EXAM: ABDOMEN - 1 VIEW COMPARISON:  02/02/2021, 03/01/2021 FINDINGS: Gastro jejunostomy tube with balloon in the left mid abdomen, indeterminate in location as there is insufficient contrast around the balloon to confirm intragastric position. There is contrast within the stomach rectum. Tip of the jejunostomy is in the left mid abdomen. IMPRESSION: 1. Gastro jejunostomy tube; intragastric position of the balloon can not be confirmed on this exam due to insufficient contrast around the balloon; contrast is noted within the proximal  stomach and rectum. Direct observation under fluoroscopic injection of contrast would be more definitive to ascertain position; alternatively CT could be considered. Electronically Signed   By: Donavan Foil M.D.   On: 03/03/2021 15:23   DG CHEST PORT 1 VIEW  Result Date: 03/06/2021 CLINICAL DATA:  Cough. EXAM: PORTABLE CHEST 1 VIEW COMPARISON:  02/12/2021 FINDINGS: Stable linear densities in the right hilar region are suggestive for scarring. Otherwise, the lungs are clear. Heart size is normal. Atherosclerotic calcifications at the aortic arch. There is evidence for retained oral contrast in the gastric fundus region. Patient has Kilah Drahos GJ feeding tube that is partially imaged. IMPRESSION: No acute cardiopulmonary disease. Electronically  Signed   By: Markus Daft M.D.   On: 03/06/2021 12:29    Microbiology: Recent Results (from the past 240 hour(s))  SARS CORONAVIRUS 2 (TAT 6-24 HRS) Nasopharyngeal Nasopharyngeal Swab     Status: Abnormal   Collection Time: 03/02/21  2:12 AM   Specimen: Nasopharyngeal Swab  Result Value Ref Range Status   SARS Coronavirus 2 POSITIVE (Erubiel Manasco) NEGATIVE Final    Comment: (NOTE) SARS-CoV-2 target nucleic acids are DETECTED.  The SARS-CoV-2 RNA is generally detectable in upper and lower respiratory specimens during the acute phase of infection. Positive results are indicative of the presence of SARS-CoV-2 RNA. Clinical correlation with patient history and other diagnostic information is  necessary to determine patient infection status. Positive results do not rule out bacterial infection or co-infection with other viruses.  The expected result is Negative.  Fact Sheet for Patients: SugarRoll.be  Fact Sheet for Healthcare Providers: https://www.woods-mathews.com/  This test is not yet approved or cleared by the [place] FDA and  has been authorized for detection and/or diagnosis of SARS-CoV-2 by FDA under an Emergency Use  Authorization (EUA). This EUA will remain  in effect (meaning this test can be used) for the duration of the COVID-19 declaration under Section 564(b)(1) of the Act, 21 U. S.C. section 360bbb-3(b)(1), unless the authorization is terminated or revoked sooner.   Performed at [facility], [place] [address], [place], [place] [address]      Labs: Basic Metabolic Panel: Recent Labs  Lab 03/02/21 0122 03/03/21 0241 03/04/21 0026 03/05/21 0131 03/06/21 1058 03/07/21 0113  NA 127* 130* 132* 132* 130*  --   K 4.0 4.1 3.9 3.2* 4.7  --   CL 98 100 103 105 102  --   CO2 _0 --   GLUCOSE 104* 108* 82 113* 115*  --   BUN <5* <5* <5* <5* 6*  --   CREATININE 0.34* 0.41* 0.35* 0.31* <0.30*  --   CALCIUM 8.8* 8.6* 8.7* 8.5* 8.3*  --   MG  --   --   --  1.6*  --  1.9  PHOS  --   --   --  3.0  --  2.5   Liver Function Tests: Recent Labs  Lab 03/05/21 0131 03/06/21 1058  AST 22 19  ALT 17 13  ALKPHOS 77 73  BILITOT 0.5 0.4  PROT 5.6* 5.7*  ALBUMIN 2.1* 2.2*   No results for input(s): LIPASE, AMYLASE in the last 168 hours. No results for input(s): AMMONIA in the last 168 hours. CBC: Recent Labs  Lab 03/02/21 0122 03/05/21 0131 03/06/21 1058 03/07/21 0113  WBC 4.9 4.1 5.7 4.7  NEUTROABS  --  2.2 3.9 2.9  HGB 9.8* 9.5* 9.5* 9.3*  HCT 30.0* 29.7* 29.4* 29.1*  MCV 94.9 95.8 96.1 96.0  PLT 370 283 269 263   Cardiac Enzymes: No results for input(s): CKTOTAL, CKMB, CKMBINDEX, TROPONINI in the last 168 hours. BNP: BNP (last 3 results) No results for input(s): BNP in the last 8760 hours.  ProBNP (last 3 results) No results for input(s): PROBNP in the last 8760 hours.  CBG: Recent Labs  Lab 03/06/21 2332 03/07/21 0334 03/07/21 0815 03/07/21 0818 03/07/21 1145  GLUCAP 139* 142* 123* 136* 87       Signed:  Fayrene Helper MD.  [facility] 03/07/2021, 12:44 PM

## 2021-03-09 LAB — GLUCOSE, CAPILLARY: Glucose-Capillary: 99 mg/dL (ref 70–99)

## 2021-03-10 ENCOUNTER — Telehealth: Payer: Self-pay

## 2021-03-10 LAB — ANTI-NUCLEAR AB-TITER (ANA TITER)
ANA TITER: 1:320 {titer} — ABNORMAL HIGH
ANA Titer 1: 1:320 {titer} — ABNORMAL HIGH

## 2021-03-10 LAB — MYOSITIS ASSESSR PLUS JO-1 AUTOABS
EJ Autoabs: DETECTED — AB
Jo-1 Autoabs: <1 AI
Ku Autoabs: NOT DETECTED
Mi-2 Autoabs: NOT DETECTED
OJ Autoabs: NOT DETECTED
PL-12 Autoabs: NOT DETECTED
PL-7 Autoabs: NOT DETECTED
SRP Autoabs: NOT DETECTED

## 2021-03-10 LAB — SEDIMENTATION RATE: Sed Rate: 86 mm/h — ABNORMAL HIGH (ref 0–30)

## 2021-03-10 LAB — CK: Total CK: 76 U/L (ref 29–143)

## 2021-03-10 LAB — ANA: Anti Nuclear Antibody (ANA): POSITIVE — AB

## 2021-03-10 LAB — ALDOLASE: Aldolase: 3.6 U/L (ref <=8.1)

## 2021-03-10 NOTE — Telephone Encounter (Signed)
Patient's daughter Kathryn [name] called stating her mom's nurses at the [facility] haven't received a prescription from Dr. Dimple Casey.  Kathryn [name] states at her mom's appointment Dr. Dimple Casey discussed medications that she might be a candidate to receive.  Kathryn [name] requested a return call.

## 2021-03-12 NOTE — Telephone Encounter (Signed)
Spoke with patient's daughter, advised Dr. Dimple Casey planned to discuss starting medications at our follow up visit along with additional test results. We are scheduled for 03/16/21 to do so.

## 2021-03-12 NOTE — Telephone Encounter (Signed)
I planned to discuss starting medications at our follow up visit along with additional test results. We are scheduled for 03/16/21 to do so.

## 2021-03-15 NOTE — Progress Notes (Signed)
Office Visit Note  Patient: Kathryn [name]             Date of Birth: October 14, 1940           MRN: [id number]             PCP: Leeanne Rio, MD Referring: Leeanne Rio, MD Visit Date: 03/16/2021   Subjective:   History of Present Illness: [name] is a 80 y.o. female here for follow up for weakness, joint pains, and positive autoantibody serology. Lab workup at that time demonstrated further increase in ESR and myositis panel EJ Abs positive. Since our last visit she had to return to the hospital for 1 week due to repeat obstruction and dislodged J tube. She is working with physical therapy with some improvement in her symptoms although remains weak. Today she has increased coughing and secretions she attributes to being without her usual oral suction at facility.   [name] daughter is present with her today assisting with mobility and collateral history.  Previous HPI 02/23/21 Kathryn [name] is a 80 y.o. female here for evaluation of joint pains and weakness with concern for possible PMR also for nontraumatic rhabdomyolysis.  Her medical history significant for esophageal dysphagia with previous stricture formation, type 2 diabetes, and chronic anemia with some a plasma cell dyscrasia but no prior diagnosis of chronic inflammatory disease. Her recent medical course has been complicated by dehydration and limited nutrition with reliance on gastrostomy tube.  No specific inciting cause was identified for the initial hospitalization and weakness with CK elevation since the original hospitalization, although subsequently was readmitted with complications particularly with repeated obstruction of gastrostomy tubing interfering with medication and nutrition.  She is experienced 29 pound total weight loss since documented weight at hospital admission in April of this year.  She continues to feel overall weak symptoms are actually partially improved compared to a few months ago but still  much worse than her previous baseline before this entire process started about 5 months ago.  Initial onset was bilateral with involvement of upper and lower extremities first came to additional attention from her decreased stability and speed with walking and then became completely unable to transfer unaided.  Joint pain initially in shoulders and hips leading to initial concern of PMR.  She has had previous shoulder pains related to rotator cuff arthropathy on the right and possibly left but these have not usually limit her activity to this extent. She received right shoulder steroid injection in May with orthopedic surgery clinic which helped somewhat locally.   Labs reviewed 09/2020 ANA pos SSA > 8.0 dsDNA, RNP, SM, Scl-70, SSB, chromatin, Jo-1, centromere neg RF neg ESR 44 CK 1,206   Review of Systems  Constitutional:  Positive for fatigue.  HENT:  Positive for mouth dryness.   Eyes:  Positive for dryness.  Respiratory:  Negative for shortness of breath.   Cardiovascular:  Negative for swelling in legs/feet.  Gastrointestinal:  Negative for constipation.  Endocrine: Negative for increased urination.  Genitourinary:  Negative for difficulty urinating.  Musculoskeletal:  Positive for joint pain, gait problem, joint pain, muscle weakness, morning stiffness and muscle tenderness.  Skin:  Negative for rash.  Allergic/Immunologic: Negative for susceptible to infections.  Neurological:  Negative for numbness.  Hematological:  Negative for bruising/bleeding tendency.  Psychiatric/Behavioral:  Negative for sleep disturbance.    PMFS History:  Patient Active Problem List   Diagnosis Date Noted   PEG tube malfunction ([place])  03/02/2021   Protein-calorie malnutrition, severe 03/02/2021   Type 2 diabetes mellitus ([place]) 03/01/2021   Chronic hyponatremia 03/01/2021   Encounter for gastrojejunal (GJ) tube placement 03/01/2021   Positive ANA (antinuclear antibody) 02/23/2021   Hx SBO 02/23/2021    Gastrojejunostomy tube status ([place]) 02/19/2021   Pressure injury of skin 01/28/2021   Constipation    Dislodged gastrostomy tube    Hypoactive bowel sounds    Abdominal pain 01/14/2021   Generalized weakness 01/14/2021   Dehydration 01/14/2021   Normocytic anemia 01/14/2021   Leukocytosis 01/14/2021   Hyperglycemia 01/14/2021   Hypoalbuminemia due to protein-calorie malnutrition (HCC) 01/14/2021   Prolonged QT interval 01/14/2021   Malnutrition of moderate degree 01/14/2021   Achalasia    Nausea and vomiting 01/13/2021   Colitis 12/25/2020   COVID-19 virus infection 12/25/2020   Loss of weight    Dysphagia    Early satiety    UTI (urinary tract infection) 10/23/2020   Elevated d-dimer 10/23/2020   Tachycardia 10/23/2020   Abnormal computed tomography of esophagus 10/23/2020   Mild protein-calorie malnutrition ([place]) 10/23/2020   Rhabdomyolysis 10/23/2020   Hypertension associated with diabetes ([place]) 10/23/2020   Sensorineural hearing loss (SNHL), bilateral 03/02/2016   Family history of colon cancer 04/27/2015   Family history of thyroid cancer 04/27/2015   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   Conjunctivochalasis of both eyes 12/04/2014   Nuclear sclerosis of both eyes 12/04/2014   H/O rotator cuff syndrome 07/12/2012   Rotator cuff tendinitis 07/12/2012   SHOULDER PAIN 10/30/2007    Past Medical History:  Diagnosis Date   Acid reflux    Diabetes ([place])    controlled with diet.    HTN (hypertension)     Family History  Problem Relation Age of Onset   Colon cancer Father        diagnosed in his late 34s.   COPD Son    Esophageal cancer Neg Hx    Stomach cancer Neg Hx    Past Surgical History:  Procedure Laterality Date   ABDOMINAL HYSTERECTOMY     BOTOX INJECTION N/A 01/15/2021   Procedure: BOTOX INJECTION;  Surgeon: Eloise Harman, DO;  [facility];  Service: Endoscopy;  Laterality: N/A;   BOWEL RESECTION N/A 01/22/2021   Procedure: SMALL BOWEL  RESECTION;  Surgeon: Virl Cagey, MD;  Location: [facility];  Service: General;  Laterality: N/A;   CHOLECYSTECTOMY     ESOPHAGEAL DILATION N/A 10/24/2020   Procedure: ESOPHAGEAL DILATION;  Surgeon: Daneil Dolin, MD;  [facility];  Service: Endoscopy;  Laterality: N/A;   ESOPHAGOGASTRODUODENOSCOPY N/A 10/24/2020   Procedure: ESOPHAGOGASTRODUODENOSCOPY (EGD);  Surgeon: Daneil Dolin, MD;  [facility];  Service: Endoscopy;  Laterality: N/A;   ESOPHAGOGASTRODUODENOSCOPY (EGD) WITH PROPOFOL N/A 01/15/2021   Procedure: ESOPHAGOGASTRODUODENOSCOPY (EGD) WITH PROPOFOL;  Surgeon: Eloise Harman, DO;  [facility];  Service: Endoscopy;  Laterality: N/A;   GASTROSTOMY N/A 01/23/2021   Procedure: REINSERTION OF GASTROSTOMY TUBE;  Surgeon: Virl Cagey, MD;  Location: [facility];  Service: General;  Laterality: N/A;   GASTROSTOMY N/A 01/22/2021   Procedure: INSERTION OF GASTROSTOMY TUBE;  Surgeon: Virl Cagey, MD;  Location: [facility];  Service: General;  Laterality: N/A;   Histosalpingogram     IR GASTR TUBE CONVERT GASTR-JEJ PER W/FL MOD SED  02/02/2021   IR [place] TUBE CHANGE  03/04/2021   LYSIS OF ADHESION  01/22/2021   Procedure: LYSIS OF ADHESION;  Surgeon:  Virl Cagey, MD;  Location: [facility];  Service: General;;   Social History   Social History Narrative   Not on file   Immunization History  Administered Date(s) Administered   Influenza Inj Mdck Quad Pf 05/26/2020   Influenza,inj,Quad PF,6+ Mos 04/19/2017, 04/12/2018, 05/31/2019   Influenza-Unspecified 04/07/2015   Moderna Sars-Covid-2 Vaccination 08/08/2019, 09/05/2019, 07/21/2020   Pneumococcal Conjugate-13 04/01/2014   Pneumococcal Polysaccharide-23 06/13/2006   Tetanus 07/25/1996     Objective: Vital Signs: BP 110/66 (BP Location: Right Arm, Patient Position: Sitting, Cuff Size: Normal)   Pulse 88   Resp 18   Ht 5' 1.5" (1.562 m)   BMI 19.71 kg/m    Physical Exam Constitutional:       Comments: Chronically ill appearing elderly woman in wheelchair  HENT:     Mouth/Throat:     Mouth: Mucous membranes are moist.     Pharynx: Oropharynx is clear.  Cardiovascular:     Rate and Rhythm: Normal rate and regular rhythm.  Pulmonary:     Effort: Pulmonary effort is normal.     Comments: Bibasilar inspiratory rales Musculoskeletal:     Right lower leg: No edema.     Left lower leg: No edema.     Musculoskeletal Exam:  Shoulders ROM limited bilaterally cannot raise overhead unaided, guarding against passive abduction, minimal tenderness to palpation around joint Elbows full ROM no tenderness or swelling Wrists full ROM no tenderness or swelling Fingers full ROM no tenderness, heberdon's nodes present no synovitis Knees full ROM no tenderness or swelling, patellofemoral crepitus present    Investigation: No additional findings.  Imaging: IR GJ Tube Change  Result Date: 03/05/2021 INDICATION: Leaking and clogged feeding gastrojejunostomy catheter. EXAM: FLUOROSCOPIC GUIDED REPLACEMENT OF GASTROJEJUNOSTOMY TUBE COMPARISON:  Fluoroscopic guided conversion of surgical gastrostomy tube to a feeding gastrojejunostomy tube-02/02/2021 MEDICATIONS: None. CONTRAST:  20 cc Omnipaque 300, administered into the stomach and proximal small bowel FLUOROSCOPY TIME:  1 minute COMPLICATIONS: None. PROCEDURE: Informed written consent was obtained from the patient after a discussion of the risks and benefits. The upper abdomen and the external portion of the existing gastrojejunostomy tube was prepped and draped in the usual sterile fashion, and a sterile drape was applied covering the operative field. Maximum barrier sterile technique with sterile gowns and gloves were used for the procedure. A timeout was performed prior to the initiation of the procedure. Attempt was made to vigorously inject the jejunal lumen however this proved unsuccessful secondary to the catheter occlusion. The external  portion of the balloon retention gastrostomy tube was cut and the balloon was deflated. A gastrojejunostomy catheter was partially retracted and jejunal catheter was cut and cannulated with a stiff Glidewire which was advanced through the entirety of the jejunal lumen to the level of the proximal small bowel. Under fluoroscopic guidance, the existing 22 French gastrojejunostomy catheter was exchanged for a new 24 [place] gastrojejunostomy catheter with the distal approximately 10 cm trimmed with tip ultimately terminating within the proximal small bowel. The retention balloon was insufflated with saline a small amount of contrast within the gastric lumen and the external disc was cinched. Contrast injection via the jejunostomy and gastric lumens confirmed appropriate functioning and positioning. A dressing was applied. The patient tolerated procedure well without immediate postprocedural complication. IMPRESSION: Successful fluoroscopic guided replacement and up sizing now 24 French gastrojejunostomy catheter (with the distal approximately 10 cm trimmed). The tip of the jejunostomy lumen lies within the proximal jejunum. Both lumens ready for immediate use. Electronically  Signed   By: Sandi Mariscal M.D.   On: 03/05/2021 07:58   DG ABDOMEN PEG TUBE LOCATION  Result Date: 03/03/2021 CLINICAL DATA:  Peg tube malfunction EXAM: ABDOMEN - 1 VIEW COMPARISON:  02/02/2021, 03/01/2021 FINDINGS: Gastro jejunostomy tube with balloon in the left mid abdomen, indeterminate in location as there is insufficient contrast around the balloon to confirm intragastric position. There is contrast within the stomach rectum. Tip of the jejunostomy is in the left mid abdomen. IMPRESSION: 1. Gastro jejunostomy tube; intragastric position of the balloon can not be confirmed on this exam due to insufficient contrast around the balloon; contrast is noted within the proximal stomach and rectum. Direct observation under fluoroscopic injection of  contrast would be more definitive to ascertain position; alternatively CT could be considered. Electronically Signed   By: Donavan Foil M.D.   On: 03/03/2021 15:23   DG CHEST PORT 1 VIEW  Result Date: 03/06/2021 CLINICAL DATA:  Cough. EXAM: PORTABLE CHEST 1 VIEW COMPARISON:  02/12/2021 FINDINGS: Stable linear densities in the right hilar region are suggestive for scarring. Otherwise, the lungs are clear. Heart size is normal. Atherosclerotic calcifications at the aortic arch. There is evidence for retained oral contrast in the gastric fundus region. Patient has a GJ feeding tube that is partially imaged. IMPRESSION: No acute cardiopulmonary disease. Electronically Signed   By: Markus Daft M.D.   On: 03/06/2021 12:29    Recent Labs: Lab Results  Component Value Date   WBC 4.7 03/07/2021   HGB 9.3 (L) 03/07/2021   PLT 263 03/07/2021   NA 130 (L) 03/07/2021   K 4.6 03/07/2021   CL 101 03/07/2021   CO2 25 03/07/2021   GLUCOSE 129 (H) 03/07/2021   BUN 6 (L) 03/07/2021   CREATININE <0.30 (L) 03/07/2021   BILITOT 0.4 03/06/2021   ALKPHOS 73 03/06/2021   AST 19 03/06/2021   ALT 13 03/06/2021   PROT 5.7 (L) 03/06/2021   ALBUMIN 2.2 (L) 03/06/2021   CALCIUM 8.4 (L) 03/07/2021    Speciality Comments: No specialty comments available.  Procedures:  No procedures performed Allergies: Cyclobenzaprine hcl   Assessment / Plan:     Visit Diagnoses: Positive ANA (antinuclear antibody) Non-traumatic rhabdomyolysis  Positive ANA serology and myositis related Abs with her current degree of proximal muscle weakness may represent a primary inflammatory process, which could also exacerbate dysphagia. Physical therapy definitely proving benefit and should continue as much as able. Recommend trial of glucocorticoids although her risk for infection seems to be the largest initial concern with a lot of aspiration risk. She is concerned about any pills due to repeated tube obstructions as well. I recommend  trial of short term steroids and IM depomedrol 40 mg in clinic today will check in remotely next week also.  Non-intractable vomiting with nausea, unspecified vomiting type  Sputum and secretion production does not appear to be any gastric contents or actual vomiting. Holding off on starting longer term medication for now.  Orders: No orders of the defined types were placed in this encounter.  No orders of the defined types were placed in this encounter.    Follow-Up Instructions: No follow-ups on file.   Collier Salina, MD  Note - This record has been created using [employer].  Chart creation errors have been sought, but may not always  have been located. Such creation errors do not reflect on  the standard of medical care.

## 2021-03-16 ENCOUNTER — Encounter: Payer: Self-pay | Admitting: Internal Medicine

## 2021-03-16 ENCOUNTER — Ambulatory Visit (INDEPENDENT_AMBULATORY_CARE_PROVIDER_SITE_OTHER): Payer: Medicare Other | Admitting: Internal Medicine

## 2021-03-16 ENCOUNTER — Other Ambulatory Visit: Payer: Self-pay

## 2021-03-16 VITALS — BP 110/66 | HR 88 | Resp 18 | Ht 61.5 in

## 2021-03-16 DIAGNOSIS — R768 Other specified abnormal immunological findings in serum: Secondary | ICD-10-CM | POA: Diagnosis not present

## 2021-03-16 DIAGNOSIS — M6282 Rhabdomyolysis: Secondary | ICD-10-CM | POA: Diagnosis not present

## 2021-03-16 DIAGNOSIS — R112 Nausea with vomiting, unspecified: Secondary | ICD-10-CM

## 2021-03-20 ENCOUNTER — Emergency Department (HOSPITAL_COMMUNITY)
Admission: EM | Admit: 2021-03-20 | Discharge: 2021-03-20 | Disposition: A | Discharge status: Home / Self Care | Payer: Medicare Other | Attending: Emergency Medicine | Admitting: Emergency Medicine

## 2021-03-20 ENCOUNTER — Encounter (HOSPITAL_COMMUNITY): Payer: Self-pay | Admitting: *Deleted

## 2021-03-20 ENCOUNTER — Other Ambulatory Visit: Payer: Self-pay

## 2021-03-20 ENCOUNTER — Telehealth (INDEPENDENT_AMBULATORY_CARE_PROVIDER_SITE_OTHER): Payer: Medicare Other | Admitting: General Surgery

## 2021-03-20 DIAGNOSIS — Z934 Other artificial openings of gastrointestinal tract status: Secondary | ICD-10-CM

## 2021-03-20 DIAGNOSIS — E119 Type 2 diabetes mellitus without complications: Secondary | ICD-10-CM | POA: Insufficient documentation

## 2021-03-20 DIAGNOSIS — K9423 Gastrostomy malfunction: Secondary | ICD-10-CM | POA: Insufficient documentation

## 2021-03-20 DIAGNOSIS — I10 Essential (primary) hypertension: Secondary | ICD-10-CM | POA: Diagnosis not present

## 2021-03-20 DIAGNOSIS — Z794 Long term (current) use of insulin: Secondary | ICD-10-CM | POA: Diagnosis not present

## 2021-03-20 DIAGNOSIS — Z79899 Other long term (current) drug therapy: Secondary | ICD-10-CM | POA: Insufficient documentation

## 2021-03-20 DIAGNOSIS — Z8616 Personal history of COVID-19: Secondary | ICD-10-CM | POA: Diagnosis not present

## 2021-03-20 NOTE — Discharge Instructions (Addendum)
Follow up with provider as needed

## 2021-03-20 NOTE — ED Triage Notes (Signed)
Pt sent here from [facility] due to no supplies for J-tube.  CN was able to flush the J-tube.

## 2021-03-20 NOTE — ED Provider Notes (Signed)
[id number] [facility] EMERGENCY DEPARTMENT Provider Note   CSN: [id number] Arrival date & time: 03/20/21  1619     History Chief Complaint  Patient presents with   J-tube occluded     Kathryn [name] is a 80 y.o. female.  Pt sent to ED because feeding tube does not flush.  Pt denies any complaints   The history is provided by the patient. No language interpreter was used.      Past Medical History:  Diagnosis Date   Acid reflux    Diabetes (HCC)    controlled with diet.    HTN (hypertension)     Patient Active Problem List   Diagnosis Date Noted   PEG tube malfunction (HCC) 03/02/2021   Protein-calorie malnutrition, severe 03/02/2021   Type 2 diabetes mellitus (HCC) 03/01/2021   Chronic hyponatremia 03/01/2021   Encounter for gastrojejunal (GJ) tube placement 03/01/2021   Positive ANA (antinuclear antibody) 02/23/2021   Hx SBO 02/23/2021   Gastrojejunostomy tube status (HCC) 02/19/2021   Pressure injury of skin 01/28/2021   Constipation    Dislodged gastrostomy tube    Hypoactive bowel sounds    Abdominal pain 01/14/2021   Generalized weakness 01/14/2021   Dehydration 01/14/2021   Normocytic anemia 01/14/2021   Leukocytosis 01/14/2021   Hyperglycemia 01/14/2021   Hypoalbuminemia due to protein-calorie malnutrition (HCC) 01/14/2021   Prolonged QT interval 01/14/2021   Malnutrition of moderate degree 01/14/2021   Achalasia    Nausea and vomiting 01/13/2021   Colitis 12/25/2020   COVID-19 virus infection 12/25/2020   Loss of weight    Dysphagia    Early satiety    UTI (urinary tract infection) 10/23/2020   Elevated d-dimer 10/23/2020   Tachycardia 10/23/2020   Abnormal computed tomography of esophagus 10/23/2020   Mild protein-calorie malnutrition (HCC) 10/23/2020   Rhabdomyolysis 10/23/2020   Hypertension associated with diabetes (HCC) 10/23/2020   Sensorineural hearing loss (SNHL), bilateral 03/02/2016   Family history of colon cancer 04/27/2015   Family  history of thyroid cancer 04/27/2015   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   Conjunctivochalasis of both eyes 12/04/2014   Nuclear sclerosis of both eyes 12/04/2014   H/O rotator cuff syndrome 07/12/2012   Rotator cuff tendinitis 07/12/2012   SHOULDER PAIN 10/30/2007    Past Surgical History:  Procedure Laterality Date   ABDOMINAL HYSTERECTOMY     BOTOX INJECTION N/A 01/15/2021   Procedure: BOTOX INJECTION;  Surgeon: Lanelle Bal, DO;  [facility];  Service: Endoscopy;  Laterality: N/A;   BOWEL RESECTION N/A 01/22/2021   Procedure: SMALL BOWEL RESECTION;  Surgeon: Lucretia Roers, MD;  Location: [facility];  Service: General;  Laterality: N/A;   CHOLECYSTECTOMY     ESOPHAGEAL DILATION N/A 10/24/2020   Procedure: ESOPHAGEAL DILATION;  Surgeon: Corbin Ade, MD;  Location: [facility];  Service: Endoscopy;  Laterality: N/A;   ESOPHAGOGASTRODUODENOSCOPY N/A 10/24/2020   Procedure: ESOPHAGOGASTRODUODENOSCOPY (EGD);  Surgeon: Corbin Ade, MD;  [facility];  Service: Endoscopy;  Laterality: N/A;   ESOPHAGOGASTRODUODENOSCOPY (EGD) WITH PROPOFOL N/A 01/15/2021   Procedure: ESOPHAGOGASTRODUODENOSCOPY (EGD) WITH PROPOFOL;  Surgeon: Lanelle Bal, DO;  [facility];  Service: Endoscopy;  Laterality: N/A;   GASTROSTOMY N/A 01/23/2021   Procedure: REINSERTION OF GASTROSTOMY TUBE;  Surgeon: Lucretia Roers, MD;  Location: [facility];  Service: General;  Laterality: N/A;   GASTROSTOMY N/A 01/22/2021   Procedure: INSERTION OF GASTROSTOMY TUBE;  Surgeon: Lucretia Roers, MD;  Location: [facility];  Service: General;  Laterality: N/A;   Histosalpingogram     IR GASTR TUBE CONVERT GASTR-JEJ PER W/FL MOD SED  02/02/2021   IR GJ TUBE CHANGE  03/04/2021   LYSIS OF ADHESION  01/22/2021   Procedure: LYSIS OF ADHESION;  Surgeon: Lucretia Roers, MD;  Location: [facility];  Service: General;;     OB History   No obstetric history on file.     Family History  Problem  Relation Age of Onset   Colon cancer Father        diagnosed in his late 39s.   COPD Son    Esophageal cancer Neg Hx    Stomach cancer Neg Hx     Social History   Tobacco Use   Smoking status: Never   Smokeless tobacco: Never  Vaping Use   Vaping Use: Never used  Substance Use Topics   Alcohol use: No   Drug use: No    Home Medications Prior to Admission medications   Medication Sig Start Date End Date Taking? Authorizing Provider  acetaminophen (TYLENOL) 325 MG tablet Place 2 tablets (650 mg total) into feeding tube every 6 (six) hours as needed for mild pain, fever or headache. 02/06/21   Emokpae, Courage, MD  GOODSENSE ASPIRIN LOW DOSE 81 MG EC tablet Take 81 mg by mouth daily. Patient not taking: Reported on 03/16/2021 02/06/21   [provider]  insulin lispro (HUMALOG KWIKPEN) 100 UNIT/ML KwikPen Inject 0-10 Units into the skin 3 (three) times daily. insulin Humalog injection 0-10 Units 0-10 Units Subcutaneous, 3 times daily with meals CBG < 70: Implement Hypoglycemia Standing Orders and refer to Hypoglycemia Standing Orders sidebar report  CBG 70 - 120: 0 unit CBG 121 - 150: 0 unit  CBG 151 - 200: 1 unit CBG 201 - 250: 2 units CBG 251 - 300: 4 units CBG 301 - 350: 6 units  CBG 351 - 400: 8 units  CBG > 400: 10 units 02/06/21   Emokpae, Courage, MD  lansoprazole (PREVACID) 3 mg/ml SUSP oral suspension Place 5 mLs (15 mg total) into feeding tube daily at 12 noon. Patient not taking: No sig reported 02/06/21   Shon Hale, MD  metoprolol tartrate (LOPRESSOR) 25 MG tablet Take 0.5 tablets (12.5 mg total) by mouth 2 (two) times daily. Patient not taking: Reported on 03/16/2021 02/06/21   Shon Hale, MD  Nutritional Supplements (FEEDING SUPPLEMENT, OSMOLITE 1.2 CAL,) LIQD Place 1,000 mLs into feeding tube continuous. 03/07/21   Zigmund Daniel., MD  Nutritional Supplements (FEEDING SUPPLEMENT, PROSOURCE TF,) liquid Place 45 mLs into feeding tube daily. 03/08/21    Zigmund Daniel., MD  sodium chloride 1 g tablet Take by mouth. 02/28/21   [provider]  triamcinolone ointment (KENALOG) 0.1 % Apply topically. 09/26/20   [provider]  Water For Irrigation, Sterile (FREE WATER) SOLN Place 120 mLs into feeding tube every 6 (six) hours. 03/07/21   Zigmund Daniel., MD    Allergies    Cyclobenzaprine hcl  Review of Systems   Review of Systems  All other systems reviewed and are negative.  Physical Exam Updated Vital Signs BP (!) 116/91   Pulse (!) 110   Temp 98.5 F (36.9 C) (Oral)   SpO2 100%   Physical Exam Vitals reviewed.  Constitutional:      Appearance: Normal appearance.  Cardiovascular:     Rate and Rhythm: Normal rate.  Pulmonary:     Effort: Pulmonary effort is  normal.  Abdominal:     General: Abdomen is flat.  Neurological:     Mental Status: She is alert. She is disoriented.  Psychiatric:        Mood and Affect: Mood normal.    ED Results / Procedures / Treatments   Labs (all labs ordered are listed, but only abnormal results are displayed) Labs Reviewed - No data to display  EKG None  Radiology No results found.  Procedures Procedures   Medications Ordered in ED Medications - No data to display  ED Course  I have reviewed the triage vital signs and the nursing notes.  Pertinent labs & imaging results that were available during my care of the patient were reviewed by me and considered in my medical decision making (see chart for details).    MDM Rules/Calculators/A&P                           MDM:  Rn flushed G tube and J tube with saline,  Both tubes pull back and flush.  I discussed with Dr. Henreitta Leber.  Pt's sister in law in [place].  She reports she does not think tube is flushed frequently enough.   Final Clinical Impression(s) / ED Diagnoses Final diagnoses:  Gastrostomy tube dysfunction (HCC)    Rx / DC Orders ED Discharge Orders     None     An After Visit  Summary was printed and given to the patient.    Osie Cheeks 03/20/21 [id number]    Bethann Berkshire, MD 03/23/21 [phone]

## 2021-03-20 NOTE — ED Notes (Signed)
Pt in bed, pt evaluated by PA, flushed both pots of pt's feeding tube, gastro tube flushed with positive stomach contents, j tube port flushed with success.  Both flushed with sterile water.

## 2021-03-20 NOTE — Telephone Encounter (Signed)
[facility]  9/7 had to get gastrojejunostomy exchanged. J tube since that time has been clogged again. They are flushing it with creon says Rainie but she wonders if they are not getting the flushes every time. I wonder if they should put a sign on the tube to try to get this flushed more regular.   If they cannot get it flushed may need to take her to hospital to get it flushed or exchanged. Would need IR exchange again if cannot get it open.   Algis Greenhouse, MD [facility] [address] [place], [place] [address] [phone] (office)

## 2021-03-20 NOTE — ED Notes (Signed)
Attempted to call report, phone rang multiple times with no answer.  Will try again.

## 2021-03-23 ENCOUNTER — Emergency Department (HOSPITAL_COMMUNITY): Payer: Medicare Other

## 2021-03-23 ENCOUNTER — Emergency Department (HOSPITAL_COMMUNITY)
Admission: EM | Admit: 2021-03-23 | Discharge: 2021-03-23 | Disposition: A | Discharge status: Home / Self Care | Payer: Medicare Other | Attending: Emergency Medicine | Admitting: Emergency Medicine

## 2021-03-23 ENCOUNTER — Other Ambulatory Visit: Payer: Self-pay

## 2021-03-23 DIAGNOSIS — E119 Type 2 diabetes mellitus without complications: Secondary | ICD-10-CM | POA: Insufficient documentation

## 2021-03-23 DIAGNOSIS — I10 Essential (primary) hypertension: Secondary | ICD-10-CM | POA: Diagnosis not present

## 2021-03-23 DIAGNOSIS — Z789 Other specified health status: Secondary | ICD-10-CM

## 2021-03-23 DIAGNOSIS — Z4659 Encounter for fitting and adjustment of other gastrointestinal appliance and device: Secondary | ICD-10-CM | POA: Insufficient documentation

## 2021-03-23 DIAGNOSIS — Z794 Long term (current) use of insulin: Secondary | ICD-10-CM | POA: Diagnosis not present

## 2021-03-23 DIAGNOSIS — Z8616 Personal history of COVID-19: Secondary | ICD-10-CM | POA: Diagnosis not present

## 2021-03-23 HISTORY — PX: IR GJ TUBE CHANGE: IMG1440

## 2021-03-23 LAB — CBG MONITORING, ED: Glucose-Capillary: 102 mg/dL — ABNORMAL HIGH (ref 70–99)

## 2021-03-23 MED ORDER — STERILE WATER FOR INJECTION IJ SOLN
INTRAMUSCULAR | Status: AC
  Filled 2021-03-23: qty 10

## 2021-03-23 MED ORDER — IOHEXOL 240 MG/ML SOLN
50.0000 mL | Freq: Once | INTRAMUSCULAR | Status: AC | PRN
  Administered 2021-03-23: 10 mL via INTRAVENOUS

## 2021-03-23 MED ORDER — LIDOCAINE VISCOUS HCL 2 % MT SOLN
OROMUCOSAL | Status: AC
  Filled 2021-03-23: qty 15

## 2021-03-23 NOTE — ED Provider Notes (Signed)
Update note  80 y/o lady with G-J tube malfunction. Concern there is a hole. Case discused with Henreitta Leber who rec IR replacement. Pt sent ER to ER for IR consult.  IR successfully replaced GJ tube.  Patient remains well-appearing on reassessment after return from IR suite.  Discharged with [facility].  Discussed with daughter at bedside.   Milagros Loll, MD 03/25/21 [phone]

## 2021-03-23 NOTE — ED Notes (Signed)
Pt transported to IR 

## 2021-03-23 NOTE — ED Triage Notes (Signed)
Pt arrives from [facility] facility with c/o abdominal pain, reported to have a hole in her J tube.

## 2021-03-23 NOTE — ED Notes (Signed)
[name] returned this RN call, Report given to Toniann Fail

## 2021-03-23 NOTE — ED Notes (Signed)
This RN attempted to call nursing report to [facility] x 2 to inform them of pt returning back to facility, was transferred with no answer.

## 2021-03-23 NOTE — ED Notes (Signed)
Pt daughter is at the bedside. Pt has frequent oral secretions, provided with suction. Pt daughter reports secretions have increased over the past week. Daughter has been updated by EDP, wishes to be notified of transfer.

## 2021-03-23 NOTE — ED Notes (Signed)
Called ptar for patient , eta about and hour

## 2021-03-23 NOTE — ED Provider Notes (Addendum)
[facility] EMERGENCY DEPARTMENT Provider Note   CSN: [id number] Arrival date & time: 03/23/21  0256     History No chief complaint on file.   Kathryn [name] is a 80 y.o. female.  80 year old female with medical problems documented below who is a DNR patient that had a G-tube placed about a month and a half ago and then replaced by GJ tube afterwards that presents with a hold GJ tube.  Once her daughter arrives the daughter states that when they tried unclogging it at 1 point there was a bubble that arose in a certain area of the tube and that appears to be where the hole is now.  No other issues at this time.       Past Medical History:  Diagnosis Date   Acid reflux    Diabetes (HCC)    controlled with diet.    HTN (hypertension)     Patient Active Problem List   Diagnosis Date Noted   PEG tube malfunction (HCC) 03/02/2021   Protein-calorie malnutrition, severe 03/02/2021   Type 2 diabetes mellitus (HCC) 03/01/2021   Chronic hyponatremia 03/01/2021   Encounter for gastrojejunal (GJ) tube placement 03/01/2021   Positive ANA (antinuclear antibody) 02/23/2021   Hx SBO 02/23/2021   Gastrojejunostomy tube status (HCC) 02/19/2021   Pressure injury of skin 01/28/2021   Constipation    Dislodged gastrostomy tube    Hypoactive bowel sounds    Abdominal pain 01/14/2021   Generalized weakness 01/14/2021   Dehydration 01/14/2021   Normocytic anemia 01/14/2021   Leukocytosis 01/14/2021   Hyperglycemia 01/14/2021   Hypoalbuminemia due to protein-calorie malnutrition (HCC) 01/14/2021   Prolonged QT interval 01/14/2021   Malnutrition of moderate degree 01/14/2021   Achalasia    Nausea and vomiting 01/13/2021   Colitis 12/25/2020   COVID-19 virus infection 12/25/2020   Loss of weight    Dysphagia    Early satiety    UTI (urinary tract infection) 10/23/2020   Elevated d-dimer 10/23/2020   Tachycardia 10/23/2020   Abnormal computed tomography of esophagus 10/23/2020   Mild  protein-calorie malnutrition (HCC) 10/23/2020   Rhabdomyolysis 10/23/2020   Hypertension associated with diabetes (HCC) 10/23/2020   Sensorineural hearing loss (SNHL), bilateral 03/02/2016   Family history of colon cancer 04/27/2015   Family history of thyroid cancer 04/27/2015   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   Conjunctivochalasis of both eyes 12/04/2014   Nuclear sclerosis of both eyes 12/04/2014   H/O rotator cuff syndrome 07/12/2012   Rotator cuff tendinitis 07/12/2012   SHOULDER PAIN 10/30/2007    Past Surgical History:  Procedure Laterality Date   ABDOMINAL HYSTERECTOMY     BOTOX INJECTION N/A 01/15/2021   Procedure: BOTOX INJECTION;  Surgeon: Lanelle Bal, DO;  [facility];  Service: Endoscopy;  Laterality: N/A;   BOWEL RESECTION N/A 01/22/2021   Procedure: SMALL BOWEL RESECTION;  Surgeon: Lucretia Roers, MD;  Location: [facility] ORS;  Service: General;  Laterality: N/A;   CHOLECYSTECTOMY     ESOPHAGEAL DILATION N/A 10/24/2020   Procedure: ESOPHAGEAL DILATION;  Surgeon: Corbin Ade, MD;  [facility];  Service: Endoscopy;  Laterality: N/A;   ESOPHAGOGASTRODUODENOSCOPY N/A 10/24/2020   Procedure: ESOPHAGOGASTRODUODENOSCOPY (EGD);  Surgeon: Corbin Ade, MD;  [facility];  Service: Endoscopy;  Laterality: N/A;   ESOPHAGOGASTRODUODENOSCOPY (EGD) WITH PROPOFOL N/A 01/15/2021   Procedure: ESOPHAGOGASTRODUODENOSCOPY (EGD) WITH PROPOFOL;  Surgeon: Lanelle Bal, DO;  [facility];  Service: Endoscopy;  Laterality: N/A;  GASTROSTOMY N/A 01/23/2021   Procedure: REINSERTION OF GASTROSTOMY TUBE;  Surgeon: Lucretia Roers, MD;  Location: [facility];  Service: General;  Laterality: N/A;   GASTROSTOMY N/A 01/22/2021   Procedure: INSERTION OF GASTROSTOMY TUBE;  Surgeon: Lucretia Roers, MD;  Location: [facility];  Service: General;  Laterality: N/A;   Histosalpingogram     IR GASTR TUBE CONVERT GASTR-JEJ PER W/FL MOD SED  02/02/2021   IR GJ  TUBE CHANGE  03/04/2021   LYSIS OF ADHESION  01/22/2021   Procedure: LYSIS OF ADHESION;  Surgeon: Lucretia Roers, MD;  Location: [facility];  Service: General;;     OB History   No obstetric history on file.     Family History  Problem Relation Age of Onset   Colon cancer Father        diagnosed in his late 40s.   COPD Son    Esophageal cancer Neg Hx    Stomach cancer Neg Hx     Social History   Tobacco Use   Smoking status: Never   Smokeless tobacco: Never  Vaping Use   Vaping Use: Never used  Substance Use Topics   Alcohol use: No   Drug use: No    Home Medications Prior to Admission medications   Medication Sig Start Date End Date Taking? Authorizing Provider  acetaminophen (TYLENOL) 325 MG tablet Place 2 tablets (650 mg total) into feeding tube every 6 (six) hours as needed for mild pain, fever or headache. 02/06/21   Emokpae, Courage, MD  GOODSENSE ASPIRIN LOW DOSE 81 MG EC tablet Take 81 mg by mouth daily. Patient not taking: Reported on 03/16/2021 02/06/21   [provider]  insulin lispro (HUMALOG KWIKPEN) 100 UNIT/ML KwikPen Inject 0-10 Units into the skin 3 (three) times daily. insulin Humalog injection 0-10 Units 0-10 Units Subcutaneous, 3 times daily with meals CBG < 70: Implement Hypoglycemia Standing Orders and refer to Hypoglycemia Standing Orders sidebar report  CBG 70 - 120: 0 unit CBG 121 - 150: 0 unit  CBG 151 - 200: 1 unit CBG 201 - 250: 2 units CBG 251 - 300: 4 units CBG 301 - 350: 6 units  CBG 351 - 400: 8 units  CBG > 400: 10 units 02/06/21   Emokpae, Courage, MD  lansoprazole (PREVACID) 3 mg/ml SUSP oral suspension Place 5 mLs (15 mg total) into feeding tube daily at 12 noon. Patient not taking: No sig reported 02/06/21   Shon Hale, MD  metoprolol tartrate (LOPRESSOR) 25 MG tablet Take 0.5 tablets (12.5 mg total) by mouth 2 (two) times daily. Patient not taking: Reported on 03/16/2021 02/06/21   Shon Hale, MD  Nutritional Supplements  (FEEDING SUPPLEMENT, OSMOLITE 1.2 CAL,) LIQD Place 1,000 mLs into feeding tube continuous. 03/07/21   Zigmund Daniel., MD  Nutritional Supplements (FEEDING SUPPLEMENT, PROSOURCE TF,) liquid Place 45 mLs into feeding tube daily. 03/08/21   Zigmund Daniel., MD  sodium chloride 1 g tablet Take by mouth. 02/28/21   [provider]  triamcinolone ointment (KENALOG) 0.1 % Apply topically. 09/26/20   [provider]  Water For Irrigation, Sterile (FREE WATER) SOLN Place 120 mLs into feeding tube every 6 (six) hours. 03/07/21   Zigmund Daniel., MD    Allergies    Cyclobenzaprine hcl  Review of Systems   Review of Systems  All other systems reviewed and are negative.  Physical Exam Updated Vital Signs BP 110/77 (BP Location: Right Arm)  Pulse (!) 101   Temp 97.6 F (36.4 C) (Oral)   Resp 18   SpO2 100%   Physical Exam Vitals and nursing note reviewed.  Constitutional:      Appearance: She is well-developed.  HENT:     Head: Normocephalic and atraumatic.     Mouth/Throat:     Mouth: Mucous membranes are moist.     Pharynx: Oropharynx is clear.  Eyes:     Pupils: Pupils are equal, round, and reactive to light.  Cardiovascular:     Rate and Rhythm: Normal rate and regular rhythm.  Pulmonary:     Effort: No respiratory distress.     Breath sounds: No stridor.  Abdominal:     General: Abdomen is flat. There is no distension.     Comments: GJ tube present, clean. Has a 8 mm eliptical hole in J portion of tube.   Musculoskeletal:        General: No swelling. Normal range of motion.     Cervical back: Normal range of motion.  Skin:    General: Skin is warm and dry.  Neurological:     General: No focal deficit present.     Mental Status: She is alert.    ED Results / Procedures / Treatments   Labs (all labs ordered are listed, but only abnormal results are displayed) Labs Reviewed - No data to display  EKG None  Radiology No results  found.  Procedures Procedures   Medications Ordered in ED Medications - No data to display  ED Course  I have reviewed the triage vital signs and the nursing notes.  Pertinent labs & imaging results that were available during my care of the patient were reviewed by me and considered in my medical decision making (see chart for details).    MDM Rules/Calculators/A&P                         I discussed with Dr. Henreitta Leber per general surgery who stated that interventional radiology need to replace this.  I discussed with Dr. Grace Isaac with interventional radiology who states that it needs to be done during the day and peripherally should be done at [facility].  I discussed with Redge Gainer and due to significant boarding issues we will hold her here until the time when interventional radiology is in-house and then transfer down to get it done.  Patient can be discharged back to her facility after the GJ tube is replaced.  Her daughter will sign her consent when the time comes and wishes to be made aware at time of transfer.  ER accepting provider is Coralee Pesa. Charge aware.    Final Clinical Impression(s) / ED Diagnoses Final diagnoses:  None    Rx / DC Orders ED Discharge Orders     None        Maryori Weide, Barbara Cower, MD 03/23/21 [id number]    Marily Memos, MD 03/23/21 [phone]

## 2021-03-23 NOTE — ED Notes (Signed)
Daughter updated on plan of care and transfer.

## 2021-03-23 NOTE — ED Notes (Addendum)
This RN called [facility] a third time with no success, this RN phone number/message left with Diplomatic Services operational officer to cal RN back. PTAR on unit to transfer pt back to facility, pt daughter at bedside. Discharge summary reviewed.

## 2021-03-23 NOTE — Procedures (Signed)
Interventional Radiology Procedure Note  Procedure: Gastrojejunostomy tube exchange  Findings: Please refer to procedural dictation for full description.  External portion of indwelling gastrojejunostomy with hole, jejunal arm clogged.  Successfully exchanged for new 24 Fr gastrojejunostomy.  Jejunal limb beyond the ligament of Trietz.  Complications: None immediate  Estimated Blood Loss: None  Recommendations: May begin using G and J ports immediately.   Marliss Coots, MD Pager: [phone]

## 2021-03-23 NOTE — ED Notes (Signed)
Secretary to call PTAR for transport back to facility  °

## 2021-03-23 NOTE — ED Notes (Signed)
Flushed and aspirated G tube without difficulty. Hole in the J tube and noted fluids leaking when flushed.

## 2021-04-03 ENCOUNTER — Encounter (HOSPITAL_COMMUNITY): Payer: Self-pay | Admitting: Emergency Medicine

## 2021-04-03 ENCOUNTER — Other Ambulatory Visit: Payer: Self-pay

## 2021-04-03 ENCOUNTER — Emergency Department (HOSPITAL_COMMUNITY)
Admission: EM | Admit: 2021-04-03 | Discharge: 2021-04-03 | Disposition: A | Discharge status: Home / Self Care | Payer: Medicare Other | Attending: Emergency Medicine | Admitting: Emergency Medicine

## 2021-04-03 DIAGNOSIS — I10 Essential (primary) hypertension: Secondary | ICD-10-CM | POA: Insufficient documentation

## 2021-04-03 DIAGNOSIS — E119 Type 2 diabetes mellitus without complications: Secondary | ICD-10-CM | POA: Insufficient documentation

## 2021-04-03 DIAGNOSIS — Z794 Long term (current) use of insulin: Secondary | ICD-10-CM | POA: Diagnosis not present

## 2021-04-03 DIAGNOSIS — Z8616 Personal history of COVID-19: Secondary | ICD-10-CM | POA: Diagnosis not present

## 2021-04-03 DIAGNOSIS — K9413 Enterostomy malfunction: Secondary | ICD-10-CM | POA: Diagnosis present

## 2021-04-03 NOTE — ED Provider Notes (Signed)
[facility] EMERGENCY DEPARTMENT Provider Note   CSN: [id number] Arrival date & time: 04/03/21  1536     History Chief Complaint  Patient presents with   Abdominal Pain    Kathryn [name] is a 80 y.o. female.  Pt presents to the ED today from the [facility] in [place] for a clogged G/J tube.  Pt had her G/J tube replaced by IR on 9/26.  The J part is clogged today.  G tube part is flushing well.  Pt initially went to [facility] ED earlier today and they arranged for her to get it replaced at [facility] on Monday (10/10).  Pt's daughter brings her here because she would rather it be done at [facility].  Daughter also concerned about her sitting in a wheelchair for 1.5 hours.        Past Medical History:  Diagnosis Date   Acid reflux    Diabetes (HCC)    controlled with diet.    HTN (hypertension)     Patient Active Problem List   Diagnosis Date Noted   PEG tube malfunction (HCC) 03/02/2021   Protein-calorie malnutrition, severe 03/02/2021   Type 2 diabetes mellitus (HCC) 03/01/2021   Chronic hyponatremia 03/01/2021   Encounter for gastrojejunal (GJ) tube placement 03/01/2021   Positive ANA (antinuclear antibody) 02/23/2021   Hx SBO 02/23/2021   Gastrojejunostomy tube status (HCC) 02/19/2021   Pressure injury of skin 01/28/2021   Constipation    Dislodged gastrostomy tube    Hypoactive bowel sounds    Abdominal pain 01/14/2021   Generalized weakness 01/14/2021   Dehydration 01/14/2021   Normocytic anemia 01/14/2021   Leukocytosis 01/14/2021   Hyperglycemia 01/14/2021   Hypoalbuminemia due to protein-calorie malnutrition (HCC) 01/14/2021   Prolonged QT interval 01/14/2021   Malnutrition of moderate degree 01/14/2021   Achalasia    Nausea and vomiting 01/13/2021   Colitis 12/25/2020   COVID-19 virus infection 12/25/2020   Loss of weight    Dysphagia    Early satiety    UTI (urinary tract infection) 10/23/2020   Elevated d-dimer 10/23/2020   Tachycardia  10/23/2020   Abnormal computed tomography of esophagus 10/23/2020   Mild protein-calorie malnutrition (HCC) 10/23/2020   Rhabdomyolysis 10/23/2020   Hypertension associated with diabetes (HCC) 10/23/2020   Sensorineural hearing loss (SNHL), bilateral 03/02/2016   Family history of colon cancer 04/27/2015   Family history of thyroid cancer 04/27/2015   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   Conjunctivochalasis of both eyes 12/04/2014   Nuclear sclerosis of both eyes 12/04/2014   H/O rotator cuff syndrome 07/12/2012   Rotator cuff tendinitis 07/12/2012   SHOULDER PAIN 10/30/2007    Past Surgical History:  Procedure Laterality Date   ABDOMINAL HYSTERECTOMY     BOTOX INJECTION N/A 01/15/2021   Procedure: BOTOX INJECTION;  Surgeon: Lanelle Bal, DO;  [facility];  Service: Endoscopy;  Laterality: N/A;   BOWEL RESECTION N/A 01/22/2021   Procedure: SMALL BOWEL RESECTION;  Surgeon: Lucretia Roers, MD;  Location: [facility];  Service: General;  Laterality: N/A;   CHOLECYSTECTOMY     ESOPHAGEAL DILATION N/A 10/24/2020   Procedure: ESOPHAGEAL DILATION;  Surgeon: Corbin Ade, MD;  [facility];  Service: Endoscopy;  Laterality: N/A;   ESOPHAGOGASTRODUODENOSCOPY N/A 10/24/2020   Procedure: ESOPHAGOGASTRODUODENOSCOPY (EGD);  Surgeon: Corbin Ade, MD;  [facility];  Service: Endoscopy;  Laterality: N/A;   ESOPHAGOGASTRODUODENOSCOPY (EGD) WITH PROPOFOL N/A 01/15/2021   Procedure: ESOPHAGOGASTRODUODENOSCOPY (EGD) WITH PROPOFOL;  Surgeon: Lanelle Bal, DO;  [facility];  Service: Endoscopy;  Laterality: N/A;   GASTROSTOMY N/A 01/23/2021   Procedure: REINSERTION OF GASTROSTOMY TUBE;  Surgeon: Lucretia Roers, MD;  Location: [facility];  Service: General;  Laterality: N/A;   GASTROSTOMY N/A 01/22/2021   Procedure: INSERTION OF GASTROSTOMY TUBE;  Surgeon: Lucretia Roers, MD;  Location: [facility];  Service: General;  Laterality: N/A;    Histosalpingogram     IR GASTR TUBE CONVERT GASTR-JEJ PER W/FL MOD SED  02/02/2021   IR GJ TUBE CHANGE  03/04/2021   IR GJ TUBE CHANGE  03/23/2021   LYSIS OF ADHESION  01/22/2021   Procedure: LYSIS OF ADHESION;  Surgeon: Lucretia Roers, MD;  Location: [facility];  Service: General;;     OB History   No obstetric history on file.     Family History  Problem Relation Age of Onset   Colon cancer Father        diagnosed in his late 75s.   COPD Son    Esophageal cancer Neg Hx    Stomach cancer Neg Hx     Social History   Tobacco Use   Smoking status: Never   Smokeless tobacco: Never  Vaping Use   Vaping Use: Never used  Substance Use Topics   Alcohol use: No   Drug use: No    Home Medications Prior to Admission medications   Medication Sig Start Date End Date Taking? Authorizing Provider  acetaminophen (TYLENOL) 325 MG tablet Place 2 tablets (650 mg total) into feeding tube every 6 (six) hours as needed for mild pain, fever or headache. 02/06/21   Mariea Clonts, Courage, MD  insulin lispro (HUMALOG KWIKPEN) 100 UNIT/ML KwikPen Inject 0-10 Units into the skin 3 (three) times daily. insulin Humalog injection 0-10 Units 0-10 Units Subcutaneous, 3 times daily with meals CBG < 70: Implement Hypoglycemia Standing Orders and refer to Hypoglycemia Standing Orders sidebar report  CBG 70 - 120: 0 unit CBG 121 - 150: 0 unit  CBG 151 - 200: 1 unit CBG 201 - 250: 2 units CBG 251 - 300: 4 units CBG 301 - 350: 6 units  CBG 351 - 400: 8 units  CBG > 400: 10 units 02/06/21   Emokpae, Courage, MD  Nutritional Supplements (FEEDING SUPPLEMENT, OSMOLITE 1.2 CAL,) LIQD Place 1,000 mLs into feeding tube continuous. 03/07/21   Zigmund Daniel., MD  Nutritional Supplements (FEEDING SUPPLEMENT, PROSOURCE TF,) liquid Place 45 mLs into feeding tube daily. 03/08/21   Zigmund Daniel., MD  sodium chloride 1 g tablet Take by mouth. 02/28/21   [provider]  triamcinolone ointment (KENALOG) 0.1 % Apply  topically. 09/26/20   [provider]  Water For Irrigation, Sterile (FREE WATER) SOLN Place 120 mLs into feeding tube every 6 (six) hours. 03/07/21   Zigmund Daniel., MD    Allergies    Cyclobenzaprine hcl  Review of Systems   Review of Systems  Gastrointestinal:        G-J tube clogged  All other systems reviewed and are negative.  Physical Exam Updated Vital Signs BP 116/65 (BP Location: Right Arm)   Pulse 98   Temp 97.8 F (36.6 C) (Oral)   Resp 18   SpO2 97%   Physical Exam Vitals and nursing note reviewed.  Constitutional:      Appearance: She is well-developed.  HENT:     Head: Normocephalic and atraumatic.     Mouth/Throat:  Mouth: Mucous membranes are moist.  Eyes:     Extraocular Movements: Extraocular movements intact.     Pupils: Pupils are equal, round, and reactive to light.  Cardiovascular:     Rate and Rhythm: Normal rate and regular rhythm.     Heart sounds: Normal heart sounds.  Pulmonary:     Effort: Pulmonary effort is normal.     Breath sounds: Normal breath sounds.  Abdominal:     General: Abdomen is flat. Bowel sounds are normal.     Palpations: Abdomen is soft.     Tenderness: There is generalized abdominal tenderness.     Comments: G-J tube intact.  G tube will flush.  J tube is clogged.  Skin:    General: Skin is warm.     Capillary Refill: Capillary refill takes less than 2 seconds.  Neurological:     General: No focal deficit present.     Mental Status: She is alert and oriented to person, place, and time.    ED Results / Procedures / Treatments   Labs (all labs ordered are listed, but only abnormal results are displayed) Labs Reviewed - No data to display  EKG None  Radiology No results found.  Procedures Procedures   Medications Ordered in ED Medications - No data to display  ED Course  I have reviewed the triage vital signs and the nursing notes.  Pertinent labs & imaging results that were available  during my care of the patient were reviewed by me and considered in my medical decision making (see chart for details).    MDM Rules/Calculators/A&P                           I spoke with IR.  They will call pt's facility and arrange for the G-J change out on Monday the 10th.  In the short term, pt can get feedings through the G portion.  Final Clinical Impression(s) / ED Diagnoses Final diagnoses:  Malfunction of jejunostomy tube [facility])    Rx / DC Orders ED Discharge Orders     None        Jacalyn Lefevre, MD 04/03/21 1737

## 2021-04-03 NOTE — Discharge Instructions (Addendum)
Our [facility] IR office will call [facility] and arrange for a time to change out G-J tube on Monday.  This will be done at [facility] or [facility].  It is ok to do tube feedings through G portion until the G-J tube is replaced.

## 2021-04-03 NOTE — ED Triage Notes (Signed)
Pt from [facility] with reports of G/J tube clogged. Attempted flushing the J tube with no success. G tube will flush.

## 2021-04-13 ENCOUNTER — Telehealth: Payer: Self-pay | Admitting: *Deleted

## 2021-04-13 NOTE — Telephone Encounter (Signed)
Advised patient's daughter that per Dr. Dimple Casey, Sure we can see her back whenever next available that works for her. I would need to take another look before repeat injection but probably fine. She verbalized understanding and will call back to schedule an appointment as she was currently driving.

## 2021-04-13 NOTE — Telephone Encounter (Signed)
Patient's daughter contacted the office stating Kathryn [name] had a steroid injection at her last visit. She states Kathryn [name] was not able to walk prior to the injection. She is now able to walk. Kathryn [name] is currently in rehab and her daughter is trying to get her home. Patient's daughter would like to schedule an appointment for another injection. Please advise.

## 2021-04-13 NOTE — Telephone Encounter (Signed)
Sure we can see her back whenever next available that works for her. I would need to take another look before repeat injection but probably fine.

## 2021-04-21 ENCOUNTER — Ambulatory Visit (INDEPENDENT_AMBULATORY_CARE_PROVIDER_SITE_OTHER): Payer: Medicare Other | Admitting: General Surgery

## 2021-04-21 ENCOUNTER — Encounter: Payer: Self-pay | Admitting: General Surgery

## 2021-04-21 ENCOUNTER — Other Ambulatory Visit: Payer: Self-pay

## 2021-04-21 ENCOUNTER — Encounter (HOSPITAL_COMMUNITY): Payer: Self-pay | Admitting: Emergency Medicine

## 2021-04-21 ENCOUNTER — Inpatient Hospital Stay (HOSPITAL_COMMUNITY)
Admission: EM | Admit: 2021-04-21 | Discharge: 2021-04-27 | DRG: 395 | Disposition: A | Discharge status: Skilled Nursing Facility | Payer: Medicare Other | Source: Skilled Nursing Facility | Attending: Internal Medicine | Admitting: Internal Medicine

## 2021-04-21 VITALS — BP 103/68 | HR 115 | Temp 98.0°F | Resp 16 | Wt 106.0 lb

## 2021-04-21 DIAGNOSIS — K9413 Enterostomy malfunction: Secondary | ICD-10-CM | POA: Diagnosis present

## 2021-04-21 DIAGNOSIS — Z934 Other artificial openings of gastrointestinal tract status: Secondary | ICD-10-CM

## 2021-04-21 DIAGNOSIS — Z20822 Contact with and (suspected) exposure to covid-19: Secondary | ICD-10-CM | POA: Diagnosis present

## 2021-04-21 DIAGNOSIS — Z931 Gastrostomy status: Secondary | ICD-10-CM

## 2021-04-21 DIAGNOSIS — Z8616 Personal history of COVID-19: Secondary | ICD-10-CM

## 2021-04-21 DIAGNOSIS — R768 Other specified abnormal immunological findings in serum: Secondary | ICD-10-CM | POA: Diagnosis present

## 2021-04-21 DIAGNOSIS — Z6820 Body mass index (BMI) 20.0-20.9, adult: Secondary | ICD-10-CM | POA: Diagnosis not present

## 2021-04-21 DIAGNOSIS — Z794 Long term (current) use of insulin: Secondary | ICD-10-CM | POA: Diagnosis not present

## 2021-04-21 DIAGNOSIS — Z66 Do not resuscitate: Secondary | ICD-10-CM | POA: Diagnosis present

## 2021-04-21 DIAGNOSIS — T85528A Displacement of other gastrointestinal prosthetic devices, implants and grafts, initial encounter: Secondary | ICD-10-CM | POA: Diagnosis not present

## 2021-04-21 DIAGNOSIS — Z800 Family history of malignant neoplasm of digestive organs: Secondary | ICD-10-CM

## 2021-04-21 DIAGNOSIS — Z825 Family history of asthma and other chronic lower respiratory diseases: Secondary | ICD-10-CM | POA: Diagnosis not present

## 2021-04-21 DIAGNOSIS — K9423 Gastrostomy malfunction: Secondary | ICD-10-CM | POA: Diagnosis present

## 2021-04-21 DIAGNOSIS — Z90710 Acquired absence of both cervix and uterus: Secondary | ICD-10-CM

## 2021-04-21 DIAGNOSIS — R636 Underweight: Secondary | ICD-10-CM | POA: Diagnosis present

## 2021-04-21 DIAGNOSIS — Z79899 Other long term (current) drug therapy: Secondary | ICD-10-CM | POA: Diagnosis not present

## 2021-04-21 DIAGNOSIS — I10 Essential (primary) hypertension: Secondary | ICD-10-CM | POA: Diagnosis present

## 2021-04-21 DIAGNOSIS — K220 Achalasia of cardia: Secondary | ICD-10-CM | POA: Diagnosis present

## 2021-04-21 DIAGNOSIS — Z808 Family history of malignant neoplasm of other organs or systems: Secondary | ICD-10-CM

## 2021-04-21 DIAGNOSIS — D649 Anemia, unspecified: Secondary | ICD-10-CM | POA: Diagnosis present

## 2021-04-21 DIAGNOSIS — Z888 Allergy status to other drugs, medicaments and biological substances status: Secondary | ICD-10-CM | POA: Diagnosis not present

## 2021-04-21 DIAGNOSIS — Z9049 Acquired absence of other specified parts of digestive tract: Secondary | ICD-10-CM

## 2021-04-21 DIAGNOSIS — L929 Granulomatous disorder of the skin and subcutaneous tissue, unspecified: Secondary | ICD-10-CM

## 2021-04-21 DIAGNOSIS — K219 Gastro-esophageal reflux disease without esophagitis: Secondary | ICD-10-CM | POA: Diagnosis present

## 2021-04-21 DIAGNOSIS — E11649 Type 2 diabetes mellitus with hypoglycemia without coma: Secondary | ICD-10-CM | POA: Diagnosis present

## 2021-04-21 LAB — CBC WITH DIFFERENTIAL/PLATELET
Abs Immature Granulocytes: 0.02 10*3/uL (ref 0.00–0.07)
Basophils Absolute: 0 10*3/uL (ref 0.0–0.1)
Basophils Relative: 0 %
Eosinophils Absolute: 0 10*3/uL (ref 0.0–0.5)
Eosinophils Relative: 0 %
HCT: 36.6 % (ref 36.0–46.0)
Hemoglobin: 11.7 g/dL — ABNORMAL LOW (ref 12.0–15.0)
Immature Granulocytes: 0 %
Lymphocytes Relative: 9 %
Lymphs Abs: 0.7 10*3/uL (ref 0.7–4.0)
MCH: 30.7 pg (ref 26.0–34.0)
MCHC: 32 g/dL (ref 30.0–36.0)
MCV: 96.1 fL (ref 80.0–100.0)
Monocytes Absolute: 0.4 10*3/uL (ref 0.1–1.0)
Monocytes Relative: 6 %
Neutro Abs: 6.3 10*3/uL (ref 1.7–7.7)
Neutrophils Relative %: 85 %
Platelets: 360 10*3/uL (ref 150–400)
RBC: 3.81 MIL/uL — ABNORMAL LOW (ref 3.87–5.11)
RDW: 14.8 % (ref 11.5–15.5)
WBC: 7.5 10*3/uL (ref 4.0–10.5)
nRBC: 0 % (ref 0.0–0.2)

## 2021-04-21 LAB — BASIC METABOLIC PANEL
Anion gap: 9 (ref 5–15)
BUN: 21 mg/dL (ref 8–23)
CO2: 24 mmol/L (ref 22–32)
Calcium: 9.4 mg/dL (ref 8.9–10.3)
Chloride: 98 mmol/L (ref 98–111)
Creatinine, Ser: 0.43 mg/dL — ABNORMAL LOW (ref 0.44–1.00)
GFR, Estimated: >60 mL/min (ref >60)
Glucose, Bld: 87 mg/dL (ref 70–99)
Potassium: 4.5 mmol/L (ref 3.5–5.1)
Sodium: 131 mmol/L — ABNORMAL LOW (ref 135–145)

## 2021-04-21 LAB — CBG MONITORING, ED
Glucose-Capillary: 107 mg/dL — ABNORMAL HIGH (ref 70–99)
Glucose-Capillary: 66 mg/dL — ABNORMAL LOW (ref 70–99)

## 2021-04-21 LAB — RESP PANEL BY RT-PCR (FLU A&B, COVID) ARPGX2
Influenza A by PCR: NEGATIVE
Influenza B by PCR: NEGATIVE
SARS Coronavirus 2 by RT PCR: NEGATIVE

## 2021-04-21 MED ORDER — SODIUM CHLORIDE 0.9 % IV BOLUS
500.0000 mL | Freq: Once | INTRAVENOUS | Status: AC
  Administered 2021-04-21: 500 mL via INTRAVENOUS

## 2021-04-21 MED ORDER — SODIUM CHLORIDE 0.9 % IV SOLN: INTRAVENOUS | Status: DC

## 2021-04-21 MED ORDER — DEXTROSE 50 % IV SOLN
INTRAVENOUS | Status: AC
  Administered 2021-04-21: 25 mL
  Filled 2021-04-21: qty 50

## 2021-04-21 MED ORDER — ONDANSETRON HCL 4 MG PO TABS: 4.0000 mg | ORAL_TABLET | Freq: Four times a day (QID) | ORAL | Status: DC | PRN

## 2021-04-21 MED ORDER — ENOXAPARIN SODIUM 30 MG/0.3ML IJ SOSY
30.0000 mg | PREFILLED_SYRINGE | INTRAMUSCULAR | Status: DC
  Administered 2021-04-21 – 2021-04-26 (×6): 30 mg via SUBCUTANEOUS
  Filled 2021-04-21 (×6): qty 0.3

## 2021-04-21 MED ORDER — ONDANSETRON HCL 4 MG/2ML IJ SOLN: 4.0000 mg | Freq: Four times a day (QID) | INTRAMUSCULAR | Status: DC | PRN

## 2021-04-21 NOTE — ED Notes (Signed)
G and J tube flushes well without resistance.  Soda left in tube.

## 2021-04-21 NOTE — H&P (Signed)
History and Physical    Kathryn [name] MPN:[id number] DOB: 05-Feb-1941 DOA: 04/21/2021  Referring MD/NP/PA: Estell Harpin MD  PCP: Suzan Slick, MD  Outpatient Specialists: Rheumatology-Rice MD, Antionette Char MD    Patient coming from: [facility]   Chief Complaint: G tube dysfunction   HPI: Kathryn [name] is a 80 y.o. female with medical history significant of hypertension, GERD, chronic achalasia, positive ANA presenting with G-tube dysfunction.  Patient noted to have been seen by general surgery in the past for PEG placement that was noted to have been converted to a open G-tube after it was dislodged and then converted to a GJ tube by IR due to the patient's inability to tolerate G-tube feeds.  Issues are felt to be somewhat related to rheumatological issues which patient is been treated with steroids for in the past.  Per report, patient with increased accretions while at the skilled nursing facility/rehab.  History as noted by patient's daughter Rannie.  Most recent evaluation of clogged G/J-tube was on October 7.  Recent G/J-tube replacement was by IR on 9/26.  No reported episodes of emesis.  Patient has had chronic secretions.  Per the daughter, they have tried scopolamine patches which only made secretions more thick and thus was discontinued.  No reported fevers or chills.  No reports of chest pain or shortness of breath. ED Course: Presented to the ER afebrile, hemodynamically stable.  Labs grossly within normal limits apart from sodium of 131.  General surgery via Dr. Mardella Layman was contacted.  General surgery tried over 2 hours to unclog the tube unsuccessfully.  Plan is to have speech therapy evaluate if swallow is changed as well as to reevaluate secretions.  Dietitian also to weigh in on G-tube feeds and to trial this over 24 to 48 hours if this plan fails, plan will be for IR to exchange the GJ tube.  Review of Systems: As per HPI otherwise 10 point review of systems negative.   Unacceptable ROS statements: "10 systems reviewed," "Extensive" (without elaboration).  Acceptable ROS statements: "All others negative," "All others reviewed and are negative," and "All others unremarkable," with at LEAST ONE ROS documented Can't double dip - if using for HPI can't use for ROS  Past Medical History:  Diagnosis Date   Acid reflux    Diabetes (HCC)    controlled with diet.    HTN (hypertension)     Past Surgical History:  Procedure Laterality Date   ABDOMINAL HYSTERECTOMY     BOTOX INJECTION N/A 01/15/2021   Procedure: BOTOX INJECTION;  Surgeon: Lanelle Bal, DO;  [facility];  Service: Endoscopy;  Laterality: N/A;   BOWEL RESECTION N/A 01/22/2021   Procedure: SMALL BOWEL RESECTION;  Surgeon: Lucretia Roers, MD;  Location: [facility];  Service: General;  Laterality: N/A;   CHOLECYSTECTOMY     ESOPHAGEAL DILATION N/A 10/24/2020   Procedure: ESOPHAGEAL DILATION;  Surgeon: Corbin Ade, MD;  [facility];  Service: Endoscopy;  Laterality: N/A;   ESOPHAGOGASTRODUODENOSCOPY N/A 10/24/2020   Procedure: ESOPHAGOGASTRODUODENOSCOPY (EGD);  Surgeon: Corbin Ade, MD;  [facility];  Service: Endoscopy;  Laterality: N/A;   ESOPHAGOGASTRODUODENOSCOPY (EGD) WITH PROPOFOL N/A 01/15/2021   Procedure: ESOPHAGOGASTRODUODENOSCOPY (EGD) WITH PROPOFOL;  Surgeon: Lanelle Bal, DO;  [facility];  Service: Endoscopy;  Laterality: N/A;   GASTROSTOMY N/A 01/23/2021   Procedure: REINSERTION OF GASTROSTOMY TUBE;  Surgeon: Lucretia Roers, MD;  Location: [facility];  Service: General;  Laterality: N/A;   GASTROSTOMY N/A 01/22/2021   Procedure: INSERTION OF GASTROSTOMY TUBE;  Surgeon: Lucretia Roers, MD;  Location: [facility];  Service: General;  Laterality: N/A;   Histosalpingogram     IR GASTR TUBE CONVERT GASTR-JEJ PER W/FL MOD SED  02/02/2021   IR GJ TUBE CHANGE  03/04/2021   IR GJ TUBE CHANGE  03/23/2021   LYSIS OF ADHESION  01/22/2021    Procedure: LYSIS OF ADHESION;  Surgeon: Lucretia Roers, MD;  Location: [facility];  Service: General;;     reports that she has never smoked. She has never used smokeless tobacco. She reports that she does not drink alcohol and does not use drugs.  Allergies  Allergen Reactions   Cyclobenzaprine Hcl Swelling    Family History  Problem Relation Age of Onset   Colon cancer Father        diagnosed in his late 5s.   COPD Son    Esophageal cancer Neg Hx    Stomach cancer Neg Hx    Unacceptable: Noncontributory, unremarkable, or negative. Acceptable: Family history reviewed and not pertinent (If you reviewed it)  Prior to Admission medications   Medication Sig Start Date End Date Taking? Authorizing Provider  acetaminophen (TYLENOL) 325 MG tablet Place 2 tablets (650 mg total) into feeding tube every 6 (six) hours as needed for mild pain, fever or headache. 02/06/21   Mariea Clonts, Courage, MD  insulin lispro (HUMALOG KWIKPEN) 100 UNIT/ML KwikPen Inject 0-10 Units into the skin 3 (three) times daily. insulin Humalog injection 0-10 Units 0-10 Units Subcutaneous, 3 times daily with meals CBG < 70: Implement Hypoglycemia Standing Orders and refer to Hypoglycemia Standing Orders sidebar report  CBG 70 - 120: 0 unit CBG 121 - 150: 0 unit  CBG 151 - 200: 1 unit CBG 201 - 250: 2 units CBG 251 - 300: 4 units CBG 301 - 350: 6 units  CBG 351 - 400: 8 units  CBG > 400: 10 units 02/06/21   Emokpae, Courage, MD  Nutritional Supplements (FEEDING SUPPLEMENT, OSMOLITE 1.2 CAL,) LIQD Place 1,000 mLs into feeding tube continuous. 03/07/21   Zigmund Daniel., MD  Nutritional Supplements (FEEDING SUPPLEMENT, PROSOURCE TF,) liquid Place 45 mLs into feeding tube daily. 03/08/21   Zigmund Daniel., MD  sodium chloride 1 g tablet Take by mouth. 02/28/21   [provider]  triamcinolone ointment (KENALOG) 0.1 % Apply topically. 09/26/20   [provider]  Water For Irrigation, Sterile (FREE WATER)  SOLN Place 120 mLs into feeding tube every 6 (six) hours. 03/07/21   Zigmund Daniel., MD    Physical Exam: Vitals:   04/21/21 1410 04/21/21 1549 04/21/21 1731 04/21/21 1746  BP:  118/72 114/66   Pulse:  (!) 113 (!) 106 (!) 50  Resp:  16 18   Temp:  97.9 F (36.6 C)    SpO2:  100% 99% 95%  Weight: 48.1 kg     Height: 5\' 1"  (1.549 m)         Constitutional: NAD, calm, comfortable Vitals:   04/21/21 1410 04/21/21 1549 04/21/21 1731 04/21/21 1746  BP:  118/72 114/66   Pulse:  (!) 113 (!) 106 (!) 50  Resp:  16 18   Temp:  97.9 F (36.6 C)    SpO2:  100% 99% 95%  Weight: 48.1 kg     Height: 5\' 1"  (1.549 m)     Gen: underweight Eyes: PERRL, lids and conjunctivae normal ENMT:  Mucous membranes are moist. Posterior pharynx clear of any exudate or lesions.Normal dentition. Using suctioning for increased secretions  Neck: normal, supple, no masses, no thyromegaly Respiratory: clear to auscultation bilaterally, no wheezing, no crackles. Normal respiratory effort. No accessory muscle use.  Cardiovascular: Regular rate and rhythm, no murmurs / rubs / gallops. No extremity edema. 2+ pedal pulses. No carotid bruits.  Abdomen: no tenderness, no masses palpated. No hepatosplenomegaly. Bowel sounds positive.  Musculoskeletal: no clubbing / cyanosis. No joint deformity upper and lower extremities. Good ROM, no contractures. Normal muscle tone.  Skin: no rashes, lesions, ulcers. No induration Neurologic: CN 2-12 grossly intact. Sensation intact, DTR normal. Strength 5/5 in all 4.  Psychiatric: Normal judgment and insight. Alert and oriented x 3. Normal mood.   Labs on Admission: I have personally reviewed following labs and imaging studies  CBC: Recent Labs  Lab 04/21/21 1543  WBC 7.5  NEUTROABS 6.3  HGB 11.7*  HCT 36.6  MCV 96.1  PLT 360   Basic Metabolic Panel: Recent Labs  Lab 04/21/21 1543  NA 131*  K 4.5  CL 98  CO2 24  GLUCOSE 87  BUN 21  CREATININE 0.43*   CALCIUM 9.4   GFR: Estimated Creatinine Clearance: 40.9 mL/min (A) (by C-G formula based on SCr of 0.43 mg/dL (L)). Liver Function Tests: No results for input(s): AST, ALT, ALKPHOS, BILITOT, PROT, ALBUMIN in the last 168 hours. No results for input(s): LIPASE, AMYLASE in the last 168 hours. No results for input(s): AMMONIA in the last 168 hours. Coagulation Profile: No results for input(s): INR, PROTIME in the last 168 hours. Cardiac Enzymes: No results for input(s): CKTOTAL, CKMB, CKMBINDEX, TROPONINI in the last 168 hours. BNP (last 3 results) No results for input(s): PROBNP in the last 8760 hours. HbA1C: No results for input(s): HGBA1C in the last 72 hours. CBG: Recent Labs  Lab 04/21/21 1415  GLUCAP 107*   Lipid Profile: No results for input(s): CHOL, HDL, LDLCALC, TRIG, CHOLHDL, LDLDIRECT in the last 72 hours. Thyroid Function Tests: No results for input(s): TSH, T4TOTAL, FREET4, T3FREE, THYROIDAB in the last 72 hours. Anemia Panel: No results for input(s): VITAMINB12, FOLATE, FERRITIN, TIBC, IRON, RETICCTPCT in the last 72 hours. Urine analysis:    Component Value Date/Time   COLORURINE AMBER (A) 01/13/2021 2309   APPEARANCEUR CLOUDY (A) 01/13/2021 2309   LABSPEC 1.025 01/13/2021 2309   PHURINE 5.0 01/13/2021 2309   GLUCOSEU NEGATIVE 01/13/2021 2309   HGBUR SMALL (A) 01/13/2021 2309   BILIRUBINUR NEGATIVE 01/13/2021 2309   KETONESUR 20 (A) 01/13/2021 2309   PROTEINUR 100 (A) 01/13/2021 2309   NITRITE POSITIVE (A) 01/13/2021 2309   LEUKOCYTESUR MODERATE (A) 01/13/2021 2309   Sepsis Labs: @LABRCNTIP (procalcitonin:4,lacticidven:4) )No results found for this or any previous visit (from the past 240 hour(s)).   Radiological Exams on Admission: No results found.  Assessment/Plan Active Problems:   Gastrostomy tube dysfunction (HCC)    1-G-tube dysfunction - Recurrent issue of dysfunction in the setting of achalasia associated with baseline rheumatological  disorder - Status post multiple rounds of G-tube replacement as well as GJ tube replacement - Appreciate general surgery input via Dr. - We will continue with the plan per recommendations: "Speech to see if swallow has changed, repeat studies to see how she handles secretions etc Dietitian to weigh in on G tube feeds, trial these for the next 24-48 hours and see if tolerates, if tolerates then can go home on G tube feeds J tube is clogged,  DO NOT USE J tube.  If G tube feeds fail, then will get IR to exchange for new GJ"  2-achalasia with chronic ANA - Noted to be managed by outpatient rheumatology - Is getting serial IM steroids for treatment at present - We will continue with current treatment regimen pending reevaluation  3-hypertension - BP stable - Titrate home regimen  DVT prophylaxis: ppx lovenx  Code Status: Full code  Family Communication: Case dicussed w/ pt's daughter Rannie over the phone  Disposition Plan: Pending further evaluation  Consults called: General Surgery- Lillia Abed MD  Admission status: INpatient    Floydene Flock MD [facility] Pager [phone]  If 7PM-7AM, please contact night-coverage [email or web address] Password [facility]  04/21/2021, 6:08 PM

## 2021-04-21 NOTE — ED Notes (Signed)
HR ranges from 48-120's.  Peri-care done.  Mepilex applied to sacral area.

## 2021-04-21 NOTE — Patient Instructions (Signed)
ED to admit to hospitalist Speech to see if swallow has changed, repeat studies to see how she handles secretions etc Dietitian to weigh in on G tube feeds, trial these for the next 24-48 hours and see if tolerates, if tolerates then can go home on G tube feeds J tube is clogged, DO NOT USE J tube.

## 2021-04-21 NOTE — ED Notes (Signed)
Resting quietly.  Pt able to suction oral secretions.

## 2021-04-21 NOTE — Progress Notes (Signed)
[facility]  G tube functioning, J tube clogged distally, in an attempt to unclog with wire I punctured membrane between the G and J portions. The J flushes now but it is flushing into the G very proximal (J tube remains clogged).   Updated RN and Dr. Estell Harpin on Epic. Needs hospitalist admission Trial G tube feeds, dietitian aware for trial 24-48 hours If tolerates G tube feeds can go home with the se, if fails like in July, then will need GJ replaced by IR this admission. Speech consult to reevaluate her swallow and she if any improvement   Algis Greenhouse, MD

## 2021-04-21 NOTE — ED Provider Notes (Signed)
[facility] EMERGENCY DEPARTMENT Provider Note   CSN: [id number] Arrival date & time: 04/21/21  1356     History Chief Complaint  Patient presents with   Hypoglycemia    Pt seen [facility], glucose 67 and peg tube clogged.  Family spoke with Dr Henreitta Leber and tod to come to [facility] for admission.      Kathryn [name] is a 80 y.o. female.  Patient has a G/J-tube that is clogged and has been seen by surgery.  They were unable to clogged the tube.  And punctured the J-tube so it is just the functioning like a G-tube.  Patient needs to be admitted to see if the G-tube needs to be replaced.   GI Problem This is a new problem. The current episode started 12 to 24 hours ago. The problem occurs constantly. The problem has not changed since onset.Pertinent negatives include no chest pain, no abdominal pain and no headaches. Nothing aggravates the symptoms. Nothing relieves the symptoms. She has tried nothing for the symptoms.      Past Medical History:  Diagnosis Date   Acid reflux    Diabetes (HCC)    controlled with diet.    HTN (hypertension)     Patient Active Problem List   Diagnosis Date Noted   PEG tube malfunction (HCC) 03/02/2021   Protein-calorie malnutrition, severe 03/02/2021   Type 2 diabetes mellitus (HCC) 03/01/2021   Chronic hyponatremia 03/01/2021   Encounter for gastrojejunal (GJ) tube placement 03/01/2021   Positive ANA (antinuclear antibody) 02/23/2021   Hx SBO 02/23/2021   Gastrojejunostomy tube status (HCC) 02/19/2021   Pressure injury of skin 01/28/2021   Constipation    Dislodged gastrostomy tube    Hypoactive bowel sounds    Abdominal pain 01/14/2021   Generalized weakness 01/14/2021   Dehydration 01/14/2021   Normocytic anemia 01/14/2021   Leukocytosis 01/14/2021   Hyperglycemia 01/14/2021   Hypoalbuminemia due to protein-calorie malnutrition (HCC) 01/14/2021   Prolonged QT interval 01/14/2021   Malnutrition of moderate degree 01/14/2021   Achalasia     Nausea and vomiting 01/13/2021   Colitis 12/25/2020   COVID-19 virus infection 12/25/2020   Loss of weight    Dysphagia    Early satiety    UTI (urinary tract infection) 10/23/2020   Elevated d-dimer 10/23/2020   Tachycardia 10/23/2020   Abnormal computed tomography of esophagus 10/23/2020   Mild protein-calorie malnutrition (HCC) 10/23/2020   Rhabdomyolysis 10/23/2020   Hypertension associated with diabetes (HCC) 10/23/2020   Sensorineural hearing loss (SNHL), bilateral 03/02/2016   Family history of colon cancer 04/27/2015   Family history of thyroid cancer 04/27/2015   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   Conjunctivochalasis of both eyes 12/04/2014   Nuclear sclerosis of both eyes 12/04/2014   H/O rotator cuff syndrome 07/12/2012   Rotator cuff tendinitis 07/12/2012   SHOULDER PAIN 10/30/2007    Past Surgical History:  Procedure Laterality Date   ABDOMINAL HYSTERECTOMY     BOTOX INJECTION N/A 01/15/2021   Procedure: BOTOX INJECTION;  Surgeon: Lanelle Bal, DO;  [facility];  Service: Endoscopy;  Laterality: N/A;   BOWEL RESECTION N/A 01/22/2021   Procedure: SMALL BOWEL RESECTION;  Surgeon: Lucretia Roers, MD;  Location: [facility];  Service: General;  Laterality: N/A;   CHOLECYSTECTOMY     ESOPHAGEAL DILATION N/A 10/24/2020   Procedure: ESOPHAGEAL DILATION;  Surgeon: Corbin Ade, MD;  [facility];  Service: Endoscopy;  Laterality: N/A;   ESOPHAGOGASTRODUODENOSCOPY N/A 10/24/2020  Procedure: ESOPHAGOGASTRODUODENOSCOPY (EGD);  Surgeon: Corbin Ade, MD;  [facility];  Service: Endoscopy;  Laterality: N/A;   ESOPHAGOGASTRODUODENOSCOPY (EGD) WITH PROPOFOL N/A 01/15/2021   Procedure: ESOPHAGOGASTRODUODENOSCOPY (EGD) WITH PROPOFOL;  Surgeon: Lanelle Bal, DO;  [facility];  Service: Endoscopy;  Laterality: N/A;   GASTROSTOMY N/A 01/23/2021   Procedure: REINSERTION OF GASTROSTOMY TUBE;  Surgeon: Lucretia Roers, MD;   Location[facility];  Service: General;  Laterality: N/A;   GASTROSTOMY N/A 01/22/2021   Procedure: INSERTION OF GASTROSTOMY TUBE;  Surgeon: Lucretia Roers, MD;  Location: [facility];  Service: General;  Laterality: N/A;   Histosalpingogram     IR GASTR TUBE CONVERT GASTR-JEJ PER W/FL MOD SED  02/02/2021   IR GJ TUBE CHANGE  03/04/2021   IR GJ TUBE CHANGE  03/23/2021   LYSIS OF ADHESION  01/22/2021   Procedure: LYSIS OF ADHESION;  Surgeon: Lucretia Roers, MD;  Location: [facility];  Service: General;;     OB History   No obstetric history on file.     Family History  Problem Relation Age of Onset   Colon cancer Father        diagnosed in his late 83s.   COPD Son    Esophageal cancer Neg Hx    Stomach cancer Neg Hx     Social History   Tobacco Use   Smoking status: Never   Smokeless tobacco: Never  Vaping Use   Vaping Use: Never used  Substance Use Topics   Alcohol use: No   Drug use: No    Home Medications Prior to Admission medications   Medication Sig Start Date End Date Taking? Authorizing Provider  acetaminophen (TYLENOL) 325 MG tablet Place 2 tablets (650 mg total) into feeding tube every 6 (six) hours as needed for mild pain, fever or headache. 02/06/21   Mariea Clonts, Courage, MD  insulin lispro (HUMALOG KWIKPEN) 100 UNIT/ML KwikPen Inject 0-10 Units into the skin 3 (three) times daily. insulin Humalog injection 0-10 Units 0-10 Units Subcutaneous, 3 times daily with meals CBG < 70: Implement Hypoglycemia Standing Orders and refer to Hypoglycemia Standing Orders sidebar report  CBG 70 - 120: 0 unit CBG 121 - 150: 0 unit  CBG 151 - 200: 1 unit CBG 201 - 250: 2 units CBG 251 - 300: 4 units CBG 301 - 350: 6 units  CBG 351 - 400: 8 units  CBG > 400: 10 units 02/06/21   Emokpae, Courage, MD  Nutritional Supplements (FEEDING SUPPLEMENT, OSMOLITE 1.2 CAL,) LIQD Place 1,000 mLs into feeding tube continuous. 03/07/21   Zigmund Daniel., MD  Nutritional Supplements (FEEDING SUPPLEMENT,  PROSOURCE TF,) liquid Place 45 mLs into feeding tube daily. 03/08/21   Zigmund Daniel., MD  sodium chloride 1 g tablet Take by mouth. 02/28/21   [provider]  triamcinolone ointment (KENALOG) 0.1 % Apply topically. 09/26/20   [provider]  Water For Irrigation, Sterile (FREE WATER) SOLN Place 120 mLs into feeding tube every 6 (six) hours. 03/07/21   Zigmund Daniel., MD    Allergies    Cyclobenzaprine hcl  Review of Systems   Review of Systems  Constitutional:  Negative for appetite change and fatigue.  HENT:  Negative for congestion, ear discharge and sinus pressure.   Eyes:  Negative for discharge.  Respiratory:  Negative for cough.   Cardiovascular:  Negative for chest pain.  Gastrointestinal:  Negative for abdominal pain and diarrhea.  Genitourinary:  Negative for frequency and hematuria.  Musculoskeletal:  Negative for back pain.  Skin:  Negative for rash.  Neurological:  Negative for seizures and headaches.  Psychiatric/Behavioral:  Negative for hallucinations.    Physical Exam Updated Vital Signs BP 114/66 (BP Location: Right Arm)   Pulse (!) 50   Temp 97.9 F (36.6 C)   Resp 18   Ht 5\' 1"  (1.549 m)   Wt 48.1 kg   SpO2 95%   BMI 20.04 kg/m   Physical Exam Vitals and nursing note reviewed.  Constitutional:      Appearance: She is well-developed.  HENT:     Head: Normocephalic.     Nose: Nose normal.  Eyes:     General: No scleral icterus.    Conjunctiva/sclera: Conjunctivae normal.  Neck:     Thyroid: No thyromegaly.  Cardiovascular:     Rate and Rhythm: Normal rate and regular rhythm.     Heart sounds: No murmur heard.   No friction rub. No gallop.  Pulmonary:     Breath sounds: No stridor. No wheezing or rales.  Chest:     Chest wall: No tenderness.  Abdominal:     General: There is no distension.     Tenderness: There is no abdominal tenderness. There is no rebound.     Comments: Abdomen nontender.  G/J-tube in place   Musculoskeletal:        General: Normal range of motion.     Cervical back: Neck supple.  Lymphadenopathy:     Cervical: No cervical adenopathy.  Skin:    Findings: No erythema or rash.  Neurological:     Mental Status: She is oriented to person, place, and time.     Motor: No abnormal muscle tone.     Coordination: Coordination normal.  Psychiatric:        Behavior: Behavior normal.    ED Results / Procedures / Treatments   Labs (all labs ordered are listed, but only abnormal results are displayed) Labs Reviewed  CBC WITH DIFFERENTIAL/PLATELET - Abnormal; Notable for the following components:      Result Value   RBC 3.81 (*)    Hemoglobin 11.7 (*)    All other components within normal limits  BASIC METABOLIC PANEL - Abnormal; Notable for the following components:   Sodium 131 (*)    Creatinine, Ser 0.43 (*)    All other components within normal limits  CBG MONITORING, ED - Abnormal; Notable for the following components:   Glucose-Capillary 107 (*)    All other components within normal limits    EKG None  Radiology No results found.  Procedures Procedures   Medications Ordered in ED Medications  sodium chloride 0.9 % bolus 500 mL (0 mLs Intravenous Stopped 04/21/21 1745)    ED Course  I have reviewed the triage vital signs and the nursing notes.  Pertinent labs & imaging results that were available during my care of the patient were reviewed by me and considered in my medical decision making (see chart for details).    MDM Rules/Calculators/A&P                           Patient will be admitted to medicine and the tube will be used at the G-tube if that does not work she will have IR replace the tube Final Clinical Impression(s) / ED Diagnoses Final diagnoses:  Dislodged gastrostomy tube    Rx / DC Orders  ED Discharge Orders     None        Bethann Berkshire, MD 04/21/21 1819

## 2021-04-21 NOTE — ED Notes (Signed)
Dr. Newton at bedside. 

## 2021-04-21 NOTE — Progress Notes (Signed)
[facility]  Patient known to me after PEG placement, converted to open G tube after it was dislodged and then converted to GJ by IR due to inability to tolerate G tube feeds.   She has achalasia, slow emptying of the stomach but no confirmation studies of gastroparesis, some motility issues of intestine and esophagus likely increased by rheumatologic issues, treated now with steroids.  She has had GJ tube for some time and has had multiple exchanges. The facility has been flushing by hand and pump with water, doing coke and creon to get the J tube unclogged, but has been unsuccessful. The J tube has been clogged since Sunday.  BP 103/68   Pulse (!) 115   Temp 98 F (36.7 C) (Oral)   Resp 16   Wt 106 lb (48.1 kg)   SpO2 98%   BMI 19.70 kg/m   J tube with impacted residual feeds Tried for over 2 hours to unclog the tube with coke, hot water, and attempted brush into J, punctured J to the G so J tube flushes erroneously now as it is all going into the G portion.  Clogged J tube, Functioning G tube in the setting of achalasia, possible gastroparesis/ motiity disorder. Has been without feeds since Sunday. She has received some steroids from Dr. Dimple Casey for possible rheumatologic issues contributing to above. May need more.   ED to admit to hospitalist Speech to see if swallow has changed, repeat studies to see how she handles secretions etc Dietitian to weigh in on G tube feeds, trial these for the next 24-48 hours and see if tolerates, if tolerates then can go home on G tube feeds J tube is clogged, DO NOT USE J tube.  If G tube feeds fail, then will get IR to exchange for new GJ.  Updated Dr. Karilyn Cota, Dr. Dimple Casey, and Dietitian.  Notified ED.  Over 2 hours was spent working with the patient's tube and trying to get it unclogged as well as arranging a plan to get this working.    Algis Greenhouse, MD [facility] [address] [place],  [place] [address] [phone] (office)

## 2021-04-22 DIAGNOSIS — K9423 Gastrostomy malfunction: Secondary | ICD-10-CM | POA: Diagnosis not present

## 2021-04-22 DIAGNOSIS — K9413 Enterostomy malfunction: Secondary | ICD-10-CM

## 2021-04-22 LAB — CBC
HCT: 31.4 % — ABNORMAL LOW (ref 36.0–46.0)
Hemoglobin: 9.9 g/dL — ABNORMAL LOW (ref 12.0–15.0)
MCH: 30.3 pg (ref 26.0–34.0)
MCHC: 31.5 g/dL (ref 30.0–36.0)
MCV: 96 fL (ref 80.0–100.0)
Platelets: 275 10*3/uL (ref 150–400)
RBC: 3.27 MIL/uL — ABNORMAL LOW (ref 3.87–5.11)
RDW: 14.7 % (ref 11.5–15.5)
WBC: 4.4 10*3/uL (ref 4.0–10.5)
nRBC: 0 % (ref 0.0–0.2)

## 2021-04-22 LAB — COMPREHENSIVE METABOLIC PANEL
ALT: 20 U/L (ref 0–44)
AST: 29 U/L (ref 15–41)
Albumin: 2.7 g/dL — ABNORMAL LOW (ref 3.5–5.0)
Alkaline Phosphatase: 74 U/L (ref 38–126)
Anion gap: 6 (ref 5–15)
BUN: 18 mg/dL (ref 8–23)
CO2: 25 mmol/L (ref 22–32)
Calcium: 8.9 mg/dL (ref 8.9–10.3)
Chloride: 101 mmol/L (ref 98–111)
Creatinine, Ser: 0.45 mg/dL (ref 0.44–1.00)
GFR, Estimated: >60 mL/min (ref >60)
Glucose, Bld: 155 mg/dL — ABNORMAL HIGH (ref 70–99)
Potassium: 3.6 mmol/L (ref 3.5–5.1)
Sodium: 132 mmol/L — ABNORMAL LOW (ref 135–145)
Total Bilirubin: 0.5 mg/dL (ref 0.3–1.2)
Total Protein: 6.5 g/dL (ref 6.5–8.1)

## 2021-04-22 LAB — CBG MONITORING, ED
Glucose-Capillary: 141 mg/dL — ABNORMAL HIGH (ref 70–99)
Glucose-Capillary: 69 mg/dL — ABNORMAL LOW (ref 70–99)
Glucose-Capillary: 81 mg/dL (ref 70–99)
Glucose-Capillary: 99 mg/dL (ref 70–99)

## 2021-04-22 LAB — MRSA NEXT GEN BY PCR, NASAL: MRSA by PCR Next Gen: DETECTED — AB

## 2021-04-22 LAB — GLUCOSE, CAPILLARY
Glucose-Capillary: 90 mg/dL (ref 70–99)
Glucose-Capillary: 108 mg/dL — ABNORMAL HIGH (ref 70–99)

## 2021-04-22 MED ORDER — PROSOURCE TF PO LIQD
45.0000 mL | Freq: Four times a day (QID) | ORAL | Status: DC
  Administered 2021-04-22: 45 mL
  Filled 2021-04-22 (×3): qty 45

## 2021-04-22 MED ORDER — MUPIROCIN 2 % EX OINT
1.0000 "application " | TOPICAL_OINTMENT | Freq: Two times a day (BID) | CUTANEOUS | Status: AC
  Administered 2021-04-22 – 2021-04-27 (×10): 1 via NASAL
  Filled 2021-04-22: qty 22

## 2021-04-22 MED ORDER — DEXTROSE-NACL 5-0.9 % IV SOLN: INTRAVENOUS | Status: AC

## 2021-04-22 MED ORDER — OSMOLITE 1.2 CAL PO LIQD
1000.0000 mL | ORAL | Status: DC
  Administered 2021-04-22: 1000 mL
  Filled 2021-04-22 (×2): qty 1000

## 2021-04-22 MED ORDER — CHLORHEXIDINE GLUCONATE CLOTH 2 % EX PADS
6.0000 | MEDICATED_PAD | Freq: Every day | CUTANEOUS | Status: DC
Start: 2021-04-23 — End: 2021-04-27
  Administered 2021-04-23 – 2021-04-26 (×4): 6 via TOPICAL

## 2021-04-22 NOTE — Progress Notes (Signed)
PROGRESS NOTE    Kathryn [name]  FAO:[id number] DOB: 06-14-1941 DOA: 04/21/2021 PCP: Suzan Slick, MD    Brief Narrative:  80 year old female with a history of GERD, chronic achalasia, positive ANA, G/J-tube dependent, admitted to the hospital due to G-tube dysfunction.  She has been evaluated by Dr. Henreitta Leber who is familiar with her case.  Attempts have been made to unclog the G-tube.  She may need tube replacement by interventional radiology if unable to have any feeding through G-tube.   Assessment & Plan:   Active Problems:   Gastrostomy tube dysfunction (HCC)   G/J-tube dysfunction -Patient with history of achalasia and failure to thrive, G/J-tube dependent -Discussed in detail with Dr. Henreitta Leber, J-tube appears to be occluded at this point -G-tube does appear to be flushing/patent.  Dr. Henreitta Leber recommended that tube feeds be attempted to be given through G-tube.  If patient does not tolerate this, she would need to have G/J-tube replaced by interventional radiology -We will request speech therapy to reevaluate swallowing to ensure that she has not had any significant change where she may be able to tolerate p.o. intake  Achalasia with chronic urinary -Followed by rheumatology  Hypertension -Blood pressures currently stable  Anemia -Currently no signs of bleeding -Continue to monitor  GERD -Continue on PPI  DVT prophylaxis: enoxaparin (LOVENOX) injection 30 mg Start: 04/21/21 2100 SCDs Start: 04/21/21 1959  Code Status: DNR Family Communication: No family present Disposition Plan: Status is: Inpatient  Remains inpatient appropriate because: Continued issues with PEG tube need to be addressed and to ensure that it is functioning appropriately         Consultants:  General surgery  Procedures:    Antimicrobials:      Subjective: Patient denies any significant complaints at this time.  No abdominal pain.  No nausea or  vomiting.  Objective: Vitals:   04/22/21 0830 04/22/21 0933 04/22/21 1000 04/22/21 1050  BP: 115/64 138/76  138/76  Pulse: 95 (!) 106  (!) 106  Resp: 20 20  20   Temp: 98.2 F (36.8 C) 98.7 F (37.1 C)  98.7 F (37.1 C)  TempSrc:    Oral  SpO2: 97% 100%    Weight:   49.6 kg 49.6 kg  Height:   5\' 1"  (1.549 m) 5\' 1"  (1.549 m)    Intake/Output Summary (Last 24 hours) at 04/22/2021 1205 Last data filed at 04/21/2021 1745 Gross per 24 hour  Intake 500 ml  Output --  Net 500 ml   Filed Weights   04/21/21 1410 04/22/21 1000 04/22/21 1050  Weight: 48.1 kg 49.6 kg 49.6 kg    Examination:  General exam: Appears calm and comfortable  Respiratory system: Clear to auscultation. Respiratory effort normal. Cardiovascular system: S1 & S2 heard, RRR. No JVD, murmurs, rubs, gallops or clicks. No pedal edema. Gastrointestinal system: Abdomen is nondistended, soft and nontender. No organomegaly or masses felt. Normal bowel sounds heard.  PEG tube in place Central nervous system: Alert and oriented. No focal neurological deficits. Extremities: Symmetric 5 x 5 power. Skin: No rashes, lesions or ulcers Psychiatry: Judgement and insight appear normal. Mood & affect appropriate.     Data Reviewed: I have personally reviewed following labs and imaging studies  CBC: Recent Labs  Lab 04/21/21 1543 04/22/21 0414  WBC 7.5 4.4  NEUTROABS 6.3  --   HGB 11.7* 9.9*  HCT 36.6 31.4*  MCV 96.1 96.0  PLT 360 275   Basic Metabolic Panel: Recent Labs  Lab 04/21/21  1543 04/22/21 0414  NA 131* 132*  K 4.5 3.6  CL 98 101  CO2 24 25  GLUCOSE 87 155*  BUN 21 18  CREATININE 0.43* 0.45  CALCIUM 9.4 8.9   GFR: Estimated Creatinine Clearance: 40.9 mL/min (by C-G formula based on SCr of 0.45 mg/dL). Liver Function Tests: Recent Labs  Lab 04/22/21 0414  AST 29  ALT 20  ALKPHOS 74  BILITOT 0.5  PROT 6.5  ALBUMIN 2.7*   No results for input(s): LIPASE, AMYLASE in the last 168 hours. No  results for input(s): AMMONIA in the last 168 hours. Coagulation Profile: No results for input(s): INR, PROTIME in the last 168 hours. Cardiac Enzymes: No results for input(s): CKTOTAL, CKMB, CKMBINDEX, TROPONINI in the last 168 hours. BNP (last 3 results) No results for input(s): PROBNP in the last 8760 hours. HbA1C: No results for input(s): HGBA1C in the last 72 hours. CBG: Recent Labs  Lab 04/21/21 2228 04/22/21 0027 04/22/21 0255 04/22/21 0520 04/22/21 0841  GLUCAP 66* 81 69* 141* 99   Lipid Profile: No results for input(s): CHOL, HDL, LDLCALC, TRIG, CHOLHDL, LDLDIRECT in the last 72 hours. Thyroid Function Tests: No results for input(s): TSH, T4TOTAL, FREET4, T3FREE, THYROIDAB in the last 72 hours. Anemia Panel: No results for input(s): VITAMINB12, FOLATE, FERRITIN, TIBC, IRON, RETICCTPCT in the last 72 hours. Sepsis Labs: No results for input(s): PROCALCITON, LATICACIDVEN in the last 168 hours.  Recent Results (from the past 240 hour(s))  Resp Panel by RT-PCR (Flu A&B, Covid) Nasopharyngeal Swab     Status: None   Collection Time: 04/21/21  6:12 PM   Specimen: Nasopharyngeal Swab; Nasopharyngeal(NP) swabs in vial transport medium  Result Value Ref Range Status   SARS Coronavirus 2 by RT PCR NEGATIVE NEGATIVE Final    Comment: (NOTE) SARS-CoV-2 target nucleic acids are NOT DETECTED.  The SARS-CoV-2 RNA is generally detectable in upper respiratory specimens during the acute phase of infection. The lowest concentration of SARS-CoV-2 viral copies this assay can detect is 138 copies/mL. A negative result does not preclude SARS-Cov-2 infection and should not be used as the sole basis for treatment or other patient management decisions. A negative result may occur with  improper specimen collection/handling, submission of specimen other than nasopharyngeal swab, presence of viral mutation(s) within the areas targeted by this assay, and inadequate number of  viral copies(<138 copies/mL). A negative result must be combined with clinical observations, patient history, and epidemiological information. The expected result is Negative.  Fact Sheet for Patients:  BloggerCourse.com  Fact Sheet for Healthcare Providers:  SeriousBroker.it  This test is no t yet approved or cleared by the [place] FDA and  has been authorized for detection and/or diagnosis of SARS-CoV-2 by FDA under an Emergency Use Authorization (EUA). This EUA will remain  in effect (meaning this test can be used) for the duration of the COVID-19 declaration under Section 564(b)(1) of the Act, 21 U.S.C.section 360bbb-3(b)(1), unless the authorization is terminated  or revoked sooner.       Influenza A by PCR NEGATIVE NEGATIVE Final   Influenza B by PCR NEGATIVE NEGATIVE Final    Comment: (NOTE) The Xpert Xpress SARS-CoV-2/FLU/RSV plus assay is intended as an aid in the diagnosis of influenza from Nasopharyngeal swab specimens and should not be used as a sole basis for treatment. Nasal washings and aspirates are unacceptable for Xpert Xpress SARS-CoV-2/FLU/RSV testing.  Fact Sheet for Patients: BloggerCourse.com  Fact Sheet for Healthcare Providers: SeriousBroker.it  This test is not  yet approved or cleared by the [place] and has been authorized for detection and/or diagnosis of SARS-CoV-2 by FDA under an Emergency Use Authorization (EUA). This EUA will remain in effect (meaning this test can be used) for the duration of the COVID-19 declaration under Section 564(b)(1) of the Act, 21 U.S.C. section 360bbb-3(b)(1), unless the authorization is terminated or revoked.  Performed at [facility], [address], [place], [place] [address]          Radiology Studies: No results found.      Scheduled Meds:  enoxaparin (LOVENOX) injection  30 mg  Subcutaneous Q24H   Continuous Infusions:  sodium chloride Stopped (04/22/21 0331)   dextrose 5 % and 0.9% NaCl 50 mL/hr at 04/22/21 1145     LOS: 1 day    Time spent:    Erick Blinks, MD [facility]   If 7PM-7AM, please contact night-coverage [email or web address]  04/22/2021, 12:05 PM

## 2021-04-22 NOTE — Progress Notes (Signed)
[facility]  Doing fair.  No pain complaints.  BP 138/76   Pulse (!) 106   Temp 98.7 F (37.1 C) (Oral)   Resp 20   Ht 5\' 1"  (1.549 m)   Wt 49.6 kg   SpO2 100%   BMI 20.66 kg/m  G tube in place Abdomen soft Spitting and using suction at baseline  GJ in place for history of achalasia, dysmotility from rheumatology issues, likely gastroparesis  G tube functions, J tube clogged (but flushes now bc the G/J membrane is punctured). Needs to trial G tube feeds for next 24-48 hours, monitor for signs of aspiration Dietitian helping with G tube feeds  Speech to evaluate swallow, changes, patient still spitting, ? Aspiration just from spitting? If fails G tube feeds, IR to exchange GJ later this week, IR notified of potential  Will follow along  , MD [facility] [address] [place], [place] [phone] (office)

## 2021-04-22 NOTE — Evaluation (Signed)
Clinical/Bedside Swallow Evaluation Patient Details  Name: Kathryn [name] MRN: [id number] Date of Birth: 21-Aug-1940  Today's Date: 04/22/2021 Time: SLP Start Time (ACUTE ONLY): 1300 SLP Stop Time (ACUTE ONLY): 1322 SLP Time Calculation (min) (ACUTE ONLY): 22 min  Past Medical History:  Past Medical History:  Diagnosis Date   Acid reflux    Diabetes (HCC)    controlled with diet.    HTN (hypertension)    Past Surgical History:  Past Surgical History:  Procedure Laterality Date   ABDOMINAL HYSTERECTOMY     BOTOX INJECTION N/A 01/15/2021   Procedure: BOTOX INJECTION;  Surgeon: Lanelle Bal, DO;  [facility];  Service: Endoscopy;  Laterality: N/A;   BOWEL RESECTION N/A 01/22/2021   Procedure: SMALL BOWEL RESECTION;  Surgeon: Lucretia Roers, MD;  Location: [facility];  Service: General;  Laterality: N/A;   CHOLECYSTECTOMY     ESOPHAGEAL DILATION N/A 10/24/2020   Procedure: ESOPHAGEAL DILATION;  Surgeon: Corbin Ade, MD;  Location: [facility];  Service: Endoscopy;  Laterality: N/A;   ESOPHAGOGASTRODUODENOSCOPY N/A 10/24/2020   Procedure: ESOPHAGOGASTRODUODENOSCOPY (EGD);  Surgeon: Corbin Ade, MD;  [facility];  Service: Endoscopy;  Laterality: N/A;   ESOPHAGOGASTRODUODENOSCOPY (EGD) WITH PROPOFOL N/A 01/15/2021   Procedure: ESOPHAGOGASTRODUODENOSCOPY (EGD) WITH PROPOFOL;  Surgeon: Lanelle Bal, DO;  [facility];  Service: Endoscopy;  Laterality: N/A;   GASTROSTOMY N/A 01/23/2021   Procedure: REINSERTION OF GASTROSTOMY TUBE;  Surgeon: Lucretia Roers, MD;  Location: [facility];  Service: General;  Laterality: N/A;   GASTROSTOMY N/A 01/22/2021   Procedure: INSERTION OF GASTROSTOMY TUBE;  Surgeon: Lucretia Roers, MD;  Location: [facility];  Service: General;  Laterality: N/A;   Histosalpingogram     IR GASTR TUBE CONVERT GASTR-JEJ PER W/FL MOD SED  02/02/2021   IR GJ TUBE CHANGE  03/04/2021   IR GJ TUBE CHANGE  03/23/2021   LYSIS OF ADHESION   01/22/2021   Procedure: LYSIS OF ADHESION;  Surgeon: Lucretia Roers, MD;  Location: [facility];  Service: General;;   HPI:  80 year old female with a history of GERD, chronic achalasia, positive ANA, G/J-tube dependent, admitted to the hospital due to G-tube dysfunction.  She has been evaluated by Dr. Henreitta Leber who is familiar with her case.  Attempts have been made to unclog the G-tube.  She may need tube replacement by interventional radiology if unable to have any feeding through G-tube. Pt with h/o Heller myotomy at [facility]. BSE requested.    Assessment / Plan / Recommendation  Clinical Impression  Pt had PEG tube placed in July 2022 due to inability to tolerate po thought to be due to severe achalasia (bringing up frothy secretions). Oral motor examination is WNL. Pt with white coated tongue. She indicates that she has had nothing by mouth every since the PEG was placed and she still has to use oral suction at home for some frothy secretions. Pt assessed with ice chips and small sips of water and she exhibited no overt signs of reduced airway protection. Pt expressed that she would like to be able to have at least water by mouth for comfort. Given Pt's diagnosis of achalasia with primary difficulties stemming from this, oral diet cannot be recommended without input from GI. GI last saw Pt in acute setting and recommended that Pt f/u with [facility] where she was seen before to see if they can offer her something. Would recommend allowing Pt to have ice chips  and small sips of water for comfort after oral care and f/u with GI at [facility]; can consider f/u with SLP as an outpatient for MBSS if GI agrees (however her dysphagia is suspected to be esophageal). SLP will sign off. Reconsult if indicated. Above to RN and MD.   SLP Visit Diagnosis: Dysphagia, unspecified (R13.10)    Aspiration Risk  Mild aspiration risk    Diet Recommendation Ice chips PRN after oral care;Free water protocol after oral care    Liquid Administration via: Cup Medication Administration: Via alternative means Supervision: Patient able to self feed Postural Changes: Seated upright at 90 degrees;Remain upright for at least 30 minutes after po intake    Other  Recommendations Recommended Consults:  (Consider referral back to [facility] who did Heller Myotomy previously) Oral Care Recommendations: Oral care prior to ice chip/H20    Recommendations for follow up therapy are one component of a multi-disciplinary discharge planning process, led by the attending physician.  Recommendations may be updated based on patient status, additional functional criteria and insurance authorization.  Follow up Recommendations None      Frequency and Duration            Prognosis Prognosis for Safe Diet Advancement: Guarded Barriers to Reach Goals:  (esophageal dysphagia)      Swallow Study   General Date of Onset: 04/21/21 HPI: 80 year old female with a history of GERD, chronic achalasia, positive ANA, G/J-tube dependent, admitted to the hospital due to G-tube dysfunction.  She has been evaluated by Dr. Henreitta Leber who is familiar with her case.  Attempts have been made to unclog the G-tube.  She may need tube replacement by interventional radiology if unable to have any feeding through G-tube. Pt with h/o Heller myotomy at [facility]. BSE requested. Type of Study: Bedside Swallow Evaluation Previous Swallow Assessment: BSE July 2022 Diet Prior to this Study: NPO;PEG tube Temperature Spikes Noted: No Respiratory Status: Room air History of Recent Intubation: No Behavior/Cognition: Alert;Cooperative;Pleasant mood Oral Cavity Assessment: Within Functional Limits (white lingual coating) Oral Care Completed by SLP: Yes Oral Cavity - Dentition: Edentulous Vision: Functional for self-feeding Self-Feeding Abilities: Able to feed self Patient Positioning: Upright in bed Baseline Vocal Quality: Normal;Low vocal intensity Volitional Cough:  Strong Volitional Swallow: Able to elicit    Oral/Motor/Sensory Function Overall Oral Motor/Sensory Function: Within functional limits   Ice Chips Ice chips: Within functional limits Presentation: Spoon   Thin Liquid Thin Liquid: Within functional limits Presentation: Cup;Self Fed    Nectar Thick Nectar Thick Liquid: Not tested   Honey Thick Honey Thick Liquid: Not tested   Puree Puree: Not tested   Solid     Solid: Not tested     Thank you,  Havery Moros, CCC-SLP [phone]  An Schnabel 04/22/2021,6:25 PM

## 2021-04-22 NOTE — Progress Notes (Addendum)
Nutrition Follow up  DOCUMENTATION CODES:      INTERVENTION:   Osmolite 1.2 @ 30 ml/h and continue advancing 10 ml q 4 hr to goal of 50/ml/hr  Decrease Prosource TF 45 ml ->TID. Discontinue once pt achieves goal rate.  At goal rate tube feeding provides 1440 kcal, 67 gm protein,  984 ml free water daily   NUTRITION DIAGNOSIS:   Inadequate oral intake related to inability to eat as evidenced by NPO status (enteral feeding dependent).  -progressing   GOAL:  Patient will meet greater than or equal to 90% of their needs  -100% of protein goal met and 70% energy goal  MONITOR:  TF tolerance, Labs, Weight trends  REASON FOR ASSESSMENT:   Consult Enteral/tube feeding initiation and management  ASSESSMENT: Patient is a 80 yo female with hx of achalasia, dysmotility, malnutrition and G-J tube dependent. Patient has experienced difficulty with tube dysfunction.   Patient cleared to trial using G-tube. Will start Osmolite 1.2 @ 20 ml/hr with protein modular via G-tube.   Patient is NPO. IVF- NS@ 75 ml/hr.  Medications and weights reviewed.   10/27- discussed pt with nursing this morning. No issues with tolerance reported overnight. Will continue to gradually advance rate and adjust protein modular. IVF per MD meeting 100% est fluid needs.  Labs: BMP Latest Ref Rng & Units 04/22/2021 04/21/2021 03/07/2021  Glucose 70 - 99 mg/dL 155(H) 87 129(H)  BUN 8 - 23 mg/dL 18 21 6(L)  Creatinine 0.44 - 1.00 mg/dL 0.45 0.43(L) <0.30(L)  Sodium 135 - 145 mmol/L 132(L) 131(L) 130(L)  Potassium 3.5 - 5.1 mmol/L 3.6 4.5 4.6  Chloride 98 - 111 mmol/L 101 98 101  CO2 22 - 32 mmol/L 25 24 25   Calcium 8.9 - 10.3 mg/dL 8.9 9.4 8.4(L)      NUTRITION - FOCUSED PHYSICAL EXAM: Nutrition-Focused physical exam completed. Findings are moderate upper arm fat depletion, moderate clavicle,deltoid muscle depletion, no edema.      Diet Order:   Diet Order             Diet NPO time specified  Diet  effective now                   EDUCATION NEEDS:  Education needs have been addressed  Skin:  Skin Assessment: Reviewed RN Assessment  Last BM:  unknown  Height:   Ht Readings from Last 1 Encounters:  04/22/21 5' 1"  (1.549 m)    Weight:   Wt Readings from Last 1 Encounters:  04/22/21 49.6 kg    Ideal Body Weight:   48 kg  BMI:  Body mass index is 20.66 kg/m.  Estimated Nutritional Needs:   Kcal:  1400-1500  Protein:  65-70 gr  Fluid:  1500 ml daily   Colman Cater MS,RD,CSG,LDN Contact: Shea Evans

## 2021-04-22 NOTE — ED Notes (Signed)
Pt CBG-69 half an amp of d50 given, Dr. Thomes Dinning notified.

## 2021-04-23 DIAGNOSIS — K9423 Gastrostomy malfunction: Secondary | ICD-10-CM | POA: Diagnosis not present

## 2021-04-23 DIAGNOSIS — I10 Essential (primary) hypertension: Secondary | ICD-10-CM | POA: Diagnosis not present

## 2021-04-23 DIAGNOSIS — K220 Achalasia of cardia: Secondary | ICD-10-CM | POA: Diagnosis not present

## 2021-04-23 LAB — GLUCOSE, CAPILLARY
Glucose-Capillary: 106 mg/dL — ABNORMAL HIGH (ref 70–99)
Glucose-Capillary: 109 mg/dL — ABNORMAL HIGH (ref 70–99)
Glucose-Capillary: 111 mg/dL — ABNORMAL HIGH (ref 70–99)
Glucose-Capillary: 120 mg/dL — ABNORMAL HIGH (ref 70–99)
Glucose-Capillary: 125 mg/dL — ABNORMAL HIGH (ref 70–99)
Glucose-Capillary: 99 mg/dL (ref 70–99)

## 2021-04-23 MED ORDER — OSMOLITE 1.2 CAL PO LIQD
1000.0000 mL | ORAL | Status: DC
  Administered 2021-04-23 – 2021-04-26 (×4): 1000 mL
  Filled 2021-04-23 (×6): qty 1000

## 2021-04-23 MED ORDER — PANTOPRAZOLE 2 MG/ML SUSPENSION
40.0000 mg | Freq: Every day | ORAL | Status: DC
  Administered 2021-04-23 – 2021-04-27 (×5): 40 mg
  Filled 2021-04-23 (×5): qty 20

## 2021-04-23 MED ORDER — PROSOURCE TF PO LIQD
45.0000 mL | Freq: Three times a day (TID) | ORAL | Status: DC
  Administered 2021-04-23 – 2021-04-27 (×13): 45 mL
  Filled 2021-04-23 (×11): qty 45

## 2021-04-23 MED ORDER — OSMOLITE 1.2 CAL PO LIQD
1000.0000 mL | ORAL | Status: DC
  Filled 2021-04-23 (×2): qty 1000

## 2021-04-23 MED ORDER — GUAIFENESIN 100 MG/5ML PO LIQD
15.0000 mL | Freq: Three times a day (TID) | ORAL | Status: DC
  Administered 2021-04-23 – 2021-04-24 (×2): 15 mL via ORAL
  Filled 2021-04-23 (×2): qty 15

## 2021-04-23 NOTE — Progress Notes (Addendum)
[facility]  Seems to be tolerating the G tube feeds. Increasing to 30cc/ hr.  She says still having spit she suctions and is thick at times but nothing to indicate it is changed or tube feeds. No abnormal taste in her mouth.   Abd soft and no pain complaints.  Lets check residuals once to goal. May trial reglan or something if high residual to see if that would help empty the stomach and make feeds more tolerable.  Algis Greenhouse, MD [facility] [address] [place], [place] [address] [phone] (office)'

## 2021-04-23 NOTE — Progress Notes (Signed)
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  She has been evaluated by Dr. Henreitta Leber who is familiar with her case.  Attempts have been made to unclog the G-tube.  She may need tube replacement by interventional radiology if unable to have any feeding through G-tube.   Assessment & Plan:   Active Problems:   Gastrostomy tube dysfunction (HCC)   Jejunostomy malfunction (HCC)   G/J-tube dysfunction -Patient with history of achalasia and failure to thrive, G/J-tube dependent -Discussed in detail with Dr. Henreitta Leber, J-tube appears to be occluded at this point -G-tube does appear to be flushing/patent.  Dr. Henreitta Leber recommended that tube feeds be attempted to be given through G-tube.  If patient does not tolerate this, she would need to have G/J-tube replaced by interventional radiology -She has been started on tube feeds through G-tube and appears to be tolerating this thus far.  Titrating rate up to goal of 50 cc/h.  Checking residuals every 4 hours -Seen by speech therapy and felt reasonable to start on ice chips/sips of water for comfort -We will need follow-up with primary gastroenterologist at [facility]  Achalasia with chronic urinary -Followed by rheumatology  Hypertension -Blood pressures currently stable  Anemia -Currently no signs of bleeding -Continue to monitor  GERD -Continue on PPI  DVT prophylaxis: enoxaparin (LOVENOX) injection 30 mg Start: 04/21/21 2100 SCDs Start: 04/21/21 1959  Code Status: DNR Family Communication: Updated patient's daughter over the phone. Disposition Plan: Status is: Inpatient  Remains inpatient appropriate because: Continued issues with PEG tube need to be addressed and to ensure that it is functioning  appropriately         Consultants:  General surgery  Procedures:    Antimicrobials:      Subjective: Patient feels as though she is doing well.  She has not had any vomiting.  No abdominal pain.  Tolerating tube feeds.  Residuals have been minimal thus far.    Objective: Vitals:   04/22/21 2116 04/23/21 0213 04/23/21 0526 04/23/21 1233  BP: (!) 117/58 122/64 139/78 140/63  Pulse: (!) 49 (!) 49 (!) 105 90  Resp: 18 18 18 17   Temp: 98.7 F (37.1 C) 98 F (36.7 C) 98.6 F (37 C) 98.7 F (37.1 C)  TempSrc: Oral Oral Oral   SpO2: 100% 100% 100% 100%  Weight:      Height:        Intake/Output Summary (Last 24 hours) at 04/23/2021 1837 Last data filed at 04/23/2021 1600 Gross per 24 hour  Intake 776.73 ml  Output 312 ml  Net 464.73 ml   Filed Weights   04/21/21 1410 04/22/21 1000 04/22/21 1050  Weight: 48.1 kg 49.6 kg 49.6 kg    Examination:  General exam: Appears calm and comfortable  Respiratory system: Clear to auscultation. Respiratory effort normal. Cardiovascular system: S1 & S2 heard, RRR. No JVD, murmurs, rubs, gallops or clicks. No pedal edema. Gastrointestinal system: Abdomen is nondistended, soft and nontender. No organomegaly or masses felt. Normal bowel sounds heard.  PEG tube in place Central nervous system: Alert and oriented. No focal neurological deficits. Extremities: Symmetric 5 x 5 power. Skin: No rashes, lesions or ulcers Psychiatry: Judgement and insight appear normal. Mood & affect appropriate.     Data Reviewed: I have personally reviewed  following labs and imaging studies  CBC: Recent Labs  Lab 04/21/21 1543 04/22/21 0414  WBC 7.5 4.4  NEUTROABS 6.3  --   HGB 11.7* 9.9*  HCT 36.6 31.4*  MCV 96.1 96.0  PLT 360 275   Basic Metabolic Panel: Recent Labs  Lab 04/21/21 1543 04/22/21 0414  NA 131* 132*  K 4.5 3.6  CL 98 101  CO2 24 25  GLUCOSE 87 155*  BUN 21 18  CREATININE 0.43* 0.45  CALCIUM 9.4 8.9    GFR: Estimated Creatinine Clearance: 40.9 mL/min (by C-G formula based on SCr of 0.45 mg/dL). Liver Function Tests: Recent Labs  Lab 04/22/21 0414  AST 29  ALT 20  ALKPHOS 74  BILITOT 0.5  PROT 6.5  ALBUMIN 2.7*   No results for input(s): LIPASE, AMYLASE in the last 168 hours. No results for input(s): AMMONIA in the last 168 hours. Coagulation Profile: No results for input(s): INR, PROTIME in the last 168 hours. Cardiac Enzymes: No results for input(s): CKTOTAL, CKMB, CKMBINDEX, TROPONINI in the last 168 hours. BNP (last 3 results) No results for input(s): PROBNP in the last 8760 hours. HbA1C: No results for input(s): HGBA1C in the last 72 hours. CBG: Recent Labs  Lab 04/23/21 0012 04/23/21 0417 04/23/21 0721 04/23/21 1118 04/23/21 1635  GLUCAP 99 125* 120* 111* 106*   Lipid Profile: No results for input(s): CHOL, HDL, LDLCALC, TRIG, CHOLHDL, LDLDIRECT in the last 72 hours. Thyroid Function Tests: No results for input(s): TSH, T4TOTAL, FREET4, T3FREE, THYROIDAB in the last 72 hours. Anemia Panel: No results for input(s): VITAMINB12, FOLATE, FERRITIN, TIBC, IRON, RETICCTPCT in the last 72 hours. Sepsis Labs: No results for input(s): PROCALCITON, LATICACIDVEN in the last 168 hours.  Recent Results (from the past 240 hour(s))  Resp Panel by RT-PCR (Flu A&B, Covid) Nasopharyngeal Swab     Status: None   Collection Time: 04/21/21  6:12 PM   Specimen: Nasopharyngeal Swab; Nasopharyngeal(NP) swabs in vial transport medium  Result Value Ref Range Status   SARS Coronavirus 2 by RT PCR NEGATIVE NEGATIVE Final    Comment: (NOTE) SARS-CoV-2 target nucleic acids are NOT DETECTED.  The SARS-CoV-2 RNA is generally detectable in upper respiratory specimens during the acute phase of infection. The lowest concentration of SARS-CoV-2 viral copies this assay can detect is 138 copies/mL. A negative result does not preclude SARS-Cov-2 infection and should not be used as the sole  basis for treatment or other patient management decisions. A negative result may occur with  improper specimen collection/handling, submission of specimen other than nasopharyngeal swab, presence of viral mutation(s) within the areas targeted by this assay, and inadequate number of viral copies(<138 copies/mL). A negative result must be combined with clinical observations, patient history, and epidemiological information. The expected result is Negative.  Fact Sheet for Patients:  BloggerCourse.com  Fact Sheet for Healthcare Providers:  SeriousBroker.it  This test is no t yet approved or cleared by the [place] FDA and  has been authorized for detection and/or diagnosis of SARS-CoV-2 by FDA under an Emergency Use Authorization (EUA). This EUA will remain  in effect (meaning this test can be used) for the duration of the COVID-19 declaration under Section 564(b)(1) of the Act, 21 U.S.C.section 360bbb-3(b)(1), unless the authorization is terminated  or revoked sooner.       Influenza A by PCR NEGATIVE NEGATIVE Final   Influenza B by PCR NEGATIVE NEGATIVE Final    Comment: (NOTE) The Xpert Xpress SARS-CoV-2/FLU/RSV plus assay is intended as  an aid in the diagnosis of influenza from Nasopharyngeal swab specimens and should not be used as a sole basis for treatment. Nasal washings and aspirates are unacceptable for Xpert Xpress SARS-CoV-2/FLU/RSV testing.  Fact Sheet for Patients: BloggerCourse.com  Fact Sheet for Healthcare Providers: SeriousBroker.it  This test is not yet approved or cleared by the [place] FDA and has been authorized for detection and/or diagnosis of SARS-CoV-2 by FDA under an Emergency Use Authorization (EUA). This EUA will remain in effect (meaning this test can be used) for the duration of the COVID-19 declaration under Section 564(b)(1) of the Act,  21 U.S.C. section 360bbb-3(b)(1), unless the authorization is terminated or revoked.  Performed at [facility], [address] [place], [place] [address]   MRSA Next Gen by PCR, Nasal     Status: Abnormal   Collection Time: 04/22/21 11:19 AM   Specimen: Nasal Mucosa; Nasal Swab  Result Value Ref Range Status   MRSA by PCR Next Gen DETECTED (A) NOT DETECTED Final    Comment: RESULT CALLED TO, READ BACK BY AND VERIFIED WITH: DOROTHY,H AT 1309 ON 10.26.22 BY RUCINSKI,B (NOTE) The GeneXpert MRSA Assay (FDA approved for NASAL specimens only), is one component of a comprehensive MRSA colonization surveillance program. It is not intended to diagnose MRSA infection nor to guide or monitor treatment for MRSA infections. Test performance is not FDA approved in patients less than 72 years old. Performed at [facility], [address], [place], [place] [address]          Radiology Studies: No results found.      Scheduled Meds:  Chlorhexidine Gluconate Cloth  6 each Topical Q0600   enoxaparin (LOVENOX) injection  30 mg Subcutaneous Q24H   feeding supplement (OSMOLITE 1.2 CAL)  1,000 mL Per Tube Q24H   feeding supplement (PROSource TF)  45 mL Per Tube TID   guaiFENesin  15 mL Oral TID   mupirocin ointment  1 application Nasal BID   pantoprazole sodium  40 mg Per Tube Daily   Continuous Infusions:     LOS: 2 days    Time spent:    Erick Blinks, MD [facility]   If 7PM-7AM, please contact night-coverage [email or web address]  04/23/2021, 6:37 PM

## 2021-04-23 NOTE — Progress Notes (Signed)
Pt has order to check residual Q4. Residual was less than 57ml from GJ tube. MD made aware of findings.

## 2021-04-23 NOTE — TOC Initial Note (Addendum)
Transition of Care [facility]) - Initial/Assessment Note    Patient Details  Name: Kathryn [name] MRN: [id number] Date of Birth: 1940-08-24  [facility] CM/SW Contact:    Annice Needy, LCSW Phone Number: 04/23/2021, 11:44 AM  Clinical Narrative:                 Patient has been in short-term rehabilitation at [facility]. She was schedule to discharge from SNF. Daughter appealed the discharge which was denied and the denial was appealed. Daughter discussed that she does not want patient to return to [facility] and that she plans on patient discharging home. Patient lives alone however, her daughter is with her daily. SNF has had the bed, wc and suction delivered to the home. Patient needs tube feeds set up. Jeri Modena with [facility] indicated that patient had previously been referred to her for tube feeding.  HH has been declined by [facility] [place] [facility] [place], [facility], [facility].   Expected Discharge Plan: Home w Home Health Services Barriers to Discharge: Continued Medical Work up   Patient Goals and CMS Choice Patient states their goals for this hospitalization and ongoing recovery are:: home with [facility]      Expected Discharge Plan and Services Expected Discharge Plan: Home w Home Health Services     Post Acute Care Choice: Home Health Living arrangements for the past 2 months: Single Family Home                           HH Arranged: RN          Prior Living Arrangements/Services Living arrangements for the past 2 months: Single Family Home Lives with:: Self Patient language and need for interpreter reviewed:: Yes Do you feel safe going back to the place where you live?: Yes      Need for Family Participation in Patient Care: Yes (Comment) Care giver support system in place?: Yes (comment)   Criminal Activity/Legal Involvement Pertinent to Current Situation/Hospitalization: No - Comment as needed  Activities of Daily Living Home Assistive  Devices/Equipment: Enteral Feeding Supplies, Wheelchair ADL Screening (condition at time of admission) Patient's cognitive ability adequate to safely complete daily activities?: No Is the patient deaf or have difficulty hearing?: No Does the patient have difficulty seeing, even when wearing glasses/contacts?: No Does the patient have difficulty concentrating, remembering, or making decisions?: Yes Patient able to express need for assistance with ADLs?: Yes Does the patient have difficulty dressing or bathing?: Yes Independently performs ADLs?: No Communication: Independent Dressing (OT): Needs assistance Is this a change from baseline?: Pre-admission baseline Grooming: Needs assistance Is this a change from baseline?: Pre-admission baseline Feeding: Independent with device (comment) (feeding tube) Bathing: Needs assistance Is this a change from baseline?: Pre-admission baseline Toileting: Needs assistance Is this a change from baseline?: Pre-admission baseline In/Out Bed: Dependent Is this a change from baseline?: Pre-admission baseline Walks in Home: Dependent Is this a change from baseline?: Pre-admission baseline Does the patient have difficulty walking or climbing stairs?: Yes Weakness of Legs: Both Weakness of Arms/Hands: Both  Permission Sought/Granted Permission sought to share information with : Family Supports    Share Information with NAME: Occupational hygienist, daughter           Emotional Assessment       Orientation: : Oriented to Self, Oriented to  Time, Oriented to Situation, Oriented to Place Alcohol / Substance Use: Not Applicable Psych Involvement: No (comment)  Admission diagnosis:  Malfunction of gastrostomy tube (HCC) [K94.23] Dislodged gastrostomy tube [I45.809X] Patient Active Problem List   Diagnosis Date Noted   Jejunostomy malfunction (HCC)    Gastrostomy tube dysfunction (HCC) 04/21/2021   PEG tube malfunction (HCC) 03/02/2021    Protein-calorie malnutrition, severe 03/02/2021   Type 2 diabetes mellitus (HCC) 03/01/2021   Chronic hyponatremia 03/01/2021   Encounter for gastrojejunal (GJ) tube placement 03/01/2021   Positive ANA (antinuclear antibody) 02/23/2021   Hx SBO 02/23/2021   Gastrojejunostomy tube status (HCC) 02/19/2021   Pressure injury of skin 01/28/2021   Constipation    Dislodged gastrostomy tube    Hypoactive bowel sounds    Abdominal pain 01/14/2021   Generalized weakness 01/14/2021   Dehydration 01/14/2021   Normocytic anemia 01/14/2021   Leukocytosis 01/14/2021   Hyperglycemia 01/14/2021   Hypoalbuminemia due to protein-calorie malnutrition (HCC) 01/14/2021   Prolonged QT interval 01/14/2021   Malnutrition of moderate degree 01/14/2021   Achalasia    Nausea and vomiting 01/13/2021   Colitis 12/25/2020   COVID-19 virus infection 12/25/2020   Loss of weight    Dysphagia    Early satiety    UTI (urinary tract infection) 10/23/2020   Elevated d-dimer 10/23/2020   Tachycardia 10/23/2020   Abnormal computed tomography of esophagus 10/23/2020   Mild protein-calorie malnutrition (HCC) 10/23/2020   Rhabdomyolysis 10/23/2020   Hypertension associated with diabetes (HCC) 10/23/2020   Sensorineural hearing loss (SNHL), bilateral 03/02/2016   Family history of colon cancer 04/27/2015   Family history of thyroid cancer 04/27/2015   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   Conjunctivochalasis of both eyes 12/04/2014   Nuclear sclerosis of both eyes 12/04/2014   H/O rotator cuff syndrome 07/12/2012   Rotator cuff tendinitis 07/12/2012   SHOULDER PAIN 10/30/2007   PCP:  Suzan Slick, MD Pharmacy:   [facility] [place] [place] - [address] [place] [address] Phone: [phone] Fax: [phone]     Social Determinants of Health (SDOH) Interventions    Readmission Risk Interventions Readmission Risk Prevention Plan 02/03/2021  Transportation Screening  Complete  PCP or Specialist Appt within 3-5 Days Complete  HRI or Home Care Consult Complete  Social Work Consult for Recovery Care Planning/Counseling Complete  Palliative Care Screening Not Applicable  Medication Review Oceanographer) Complete  Some recent data might be hidden

## 2021-04-23 NOTE — Progress Notes (Signed)
Pt has order to check residual Q4. Residual was 35ml from GJ tube. MD bridges made aware of findings.

## 2021-04-24 ENCOUNTER — Inpatient Hospital Stay (HOSPITAL_COMMUNITY): Payer: Medicare Other

## 2021-04-24 DIAGNOSIS — I10 Essential (primary) hypertension: Secondary | ICD-10-CM | POA: Diagnosis not present

## 2021-04-24 DIAGNOSIS — K220 Achalasia of cardia: Secondary | ICD-10-CM | POA: Diagnosis not present

## 2021-04-24 DIAGNOSIS — K9423 Gastrostomy malfunction: Secondary | ICD-10-CM | POA: Diagnosis not present

## 2021-04-24 LAB — GLUCOSE, CAPILLARY
Glucose-Capillary: 102 mg/dL — ABNORMAL HIGH (ref 70–99)
Glucose-Capillary: 102 mg/dL — ABNORMAL HIGH (ref 70–99)
Glucose-Capillary: 115 mg/dL — ABNORMAL HIGH (ref 70–99)
Glucose-Capillary: 124 mg/dL — ABNORMAL HIGH (ref 70–99)
Glucose-Capillary: 127 mg/dL — ABNORMAL HIGH (ref 70–99)
Glucose-Capillary: 154 mg/dL — ABNORMAL HIGH (ref 70–99)
Glucose-Capillary: 93 mg/dL (ref 70–99)

## 2021-04-24 MED ORDER — GUAIFENESIN 100 MG/5ML PO LIQD
15.0000 mL | Freq: Three times a day (TID) | ORAL | Status: DC
  Administered 2021-04-24 – 2021-04-27 (×9): 15 mL
  Filled 2021-04-24 (×9): qty 15

## 2021-04-24 MED ORDER — IOHEXOL 300 MG/ML  SOLN
50.0000 mL | Freq: Once | INTRAMUSCULAR | Status: AC | PRN
  Administered 2021-04-24: 50 mL

## 2021-04-24 NOTE — Progress Notes (Signed)
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  She has been evaluated by Dr. Henreitta Leber who is familiar with her case.  Attempts have been made to unclog the G-tube.  She may need tube replacement by interventional radiology if unable to have any feeding through G-tube.   Assessment & Plan:   Active Problems:   Gastrostomy tube dysfunction (HCC)   Jejunostomy malfunction (HCC)   G/J-tube dysfunction -Patient with history of achalasia and failure to thrive, G/J-tube dependent -Discussed in detail with Dr. Henreitta Leber, J-tube appears to be occluded at this point -G-tube does appear to be flushing/patent.  Dr. Henreitta Leber recommended that tube feeds be attempted to be given through G-tube.  If patient does not tolerate this, she would need to have G/J-tube replaced by interventional radiology -She has been started on tube feeds through G-tube and appears to be tolerating this thus far.  Tube feeds currently running at goal of 50 cc/h.  She has not had any significant residuals thus far -TOC helping arrange supplies at home so that family can continue tube feeding at home.  Nursing is providing education to family members.  Still waiting for equipment to be set up at home prior to discharge -Seen by speech therapy and felt reasonable to start on ice chips/sips of water for comfort -We will need follow-up with primary gastroenterologist at [facility]  Achalasia with chronic urinary -Followed by rheumatology  Hypertension -Blood pressures currently stable  Anemia -Currently no signs of bleeding -Continue to monitor  GERD -Continue on PPI  DVT prophylaxis: enoxaparin (LOVENOX) injection 30 mg Start: 04/21/21 2100 SCDs Start: 04/21/21 1959  Code Status: DNR Family  Communication: Updated patient's daughter over the phone 10/28. Disposition Plan: Status is: Inpatient  Remains inpatient appropriate because: Continued issues with PEG tube need to be addressed and to ensure that it is functioning appropriately         Consultants:  General surgery  Procedures:    Antimicrobials:      Subjective: She is tolerating tube feeds.  Minimal residuals overnight.  No new complaints.  Objective: Vitals:   04/23/21 2109 04/24/21 0553 04/24/21 0601 04/24/21 1231  BP: (!) 145/70  137/75 136/74  Pulse: 98  89 92  Resp: 17   18  Temp: 97.9 F (36.6 C)  98.6 F (37 C) 98.4 F (36.9 C)  TempSrc:   Oral   SpO2: 100%  100% 100%  Weight:  97.6 kg    Height:        Intake/Output Summary (Last 24 hours) at 04/24/2021 2015 Last data filed at 04/24/2021 1802 Gross per 24 hour  Intake 0 ml  Output 402 ml  Net -402 ml   Filed Weights   04/22/21 1000 04/22/21 1050 04/24/21 0553  Weight: 49.6 kg 49.6 kg 97.6 kg    Examination:  General exam: Appears calm and comfortable  Respiratory system: Clear to auscultation. Respiratory effort normal. Cardiovascular system: S1 & S2 heard, RRR. No JVD, murmurs, rubs, gallops or clicks. No pedal edema. Gastrointestinal system: Abdomen is nondistended, soft and nontender. No organomegaly or masses felt. Normal bowel sounds heard.  PEG tube in place Central nervous system: Alert and oriented. No focal neurological deficits. Extremities: Symmetric 5 x 5 power. Skin: No rashes, lesions or ulcers Psychiatry:  Judgement and insight appear normal. Mood & affect appropriate.     Data Reviewed: I have personally reviewed following labs and imaging studies  CBC: Recent Labs  Lab 04/21/21 1543 04/22/21 0414  WBC 7.5 4.4  NEUTROABS 6.3  --   HGB 11.7* 9.9*  HCT 36.6 31.4*  MCV 96.1 96.0  PLT 360 275   Basic Metabolic Panel: Recent Labs  Lab 04/21/21 1543 04/22/21 0414  NA 131* 132*  K 4.5 3.6  CL 98  101  CO2 24 25  GLUCOSE 87 155*  BUN 21 18  CREATININE 0.43* 0.45  CALCIUM 9.4 8.9   GFR: Estimated Creatinine Clearance: 57.9 mL/min (by C-G formula based on SCr of 0.45 mg/dL). Liver Function Tests: Recent Labs  Lab 04/22/21 0414  AST 29  ALT 20  ALKPHOS 74  BILITOT 0.5  PROT 6.5  ALBUMIN 2.7*   No results for input(s): LIPASE, AMYLASE in the last 168 hours. No results for input(s): AMMONIA in the last 168 hours. Coagulation Profile: No results for input(s): INR, PROTIME in the last 168 hours. Cardiac Enzymes: No results for input(s): CKTOTAL, CKMB, CKMBINDEX, TROPONINI in the last 168 hours. BNP (last 3 results) No results for input(s): PROBNP in the last 8760 hours. HbA1C: No results for input(s): HGBA1C in the last 72 hours. CBG: Recent Labs  Lab 04/24/21 0038 04/24/21 0602 04/24/21 0720 04/24/21 1121 04/24/21 1712  GLUCAP 127* 154* 124* 102* 115*   Lipid Profile: No results for input(s): CHOL, HDL, LDLCALC, TRIG, CHOLHDL, LDLDIRECT in the last 72 hours. Thyroid Function Tests: No results for input(s): TSH, T4TOTAL, FREET4, T3FREE, THYROIDAB in the last 72 hours. Anemia Panel: No results for input(s): VITAMINB12, FOLATE, FERRITIN, TIBC, IRON, RETICCTPCT in the last 72 hours. Sepsis Labs: No results for input(s): PROCALCITON, LATICACIDVEN in the last 168 hours.  Recent Results (from the past 240 hour(s))  Resp Panel by RT-PCR (Flu A&B, Covid) Nasopharyngeal Swab     Status: None   Collection Time: 04/21/21  6:12 PM   Specimen: Nasopharyngeal Swab; Nasopharyngeal(NP) swabs in vial transport medium  Result Value Ref Range Status   SARS Coronavirus 2 by RT PCR NEGATIVE NEGATIVE Final    Comment: (NOTE) SARS-CoV-2 target nucleic acids are NOT DETECTED.  The SARS-CoV-2 RNA is generally detectable in upper respiratory specimens during the acute phase of infection. The lowest concentration of SARS-CoV-2 viral copies this assay can detect is 138 copies/mL. A  negative result does not preclude SARS-Cov-2 infection and should not be used as the sole basis for treatment or other patient management decisions. A negative result may occur with  improper specimen collection/handling, submission of specimen other than nasopharyngeal swab, presence of viral mutation(s) within the areas targeted by this assay, and inadequate number of viral copies(<138 copies/mL). A negative result must be combined with clinical observations, patient history, and epidemiological information. The expected result is Negative.  Fact Sheet for Patients:  BloggerCourse.com  Fact Sheet for Healthcare Providers:  SeriousBroker.it  This test is no t yet approved or cleared by the [place] FDA and  has been authorized for detection and/or diagnosis of SARS-CoV-2 by FDA under an Emergency Use Authorization (EUA). This EUA will remain  in effect (meaning this test can be used) for the duration of the COVID-19 declaration under Section 564(b)(1) of the Act, 21 U.S.C.section 360bbb-3(b)(1), unless the authorization is terminated  or revoked sooner.       Influenza A by PCR NEGATIVE NEGATIVE Final   Influenza B  by PCR NEGATIVE NEGATIVE Final    Comment: (NOTE) The Xpert Xpress SARS-CoV-2/FLU/RSV plus assay is intended as an aid in the diagnosis of influenza from Nasopharyngeal swab specimens and should not be used as a sole basis for treatment. Nasal washings and aspirates are unacceptable for Xpert Xpress SARS-CoV-2/FLU/RSV testing.  Fact Sheet for Patients: BloggerCourse.com  Fact Sheet for Healthcare Providers: SeriousBroker.it  This test is not yet approved or cleared by the [place] FDA and has been authorized for detection and/or diagnosis of SARS-CoV-2 by FDA under an Emergency Use Authorization (EUA). This EUA will remain in effect (meaning this test can  be used) for the duration of the COVID-19 declaration under Section 564(b)(1) of the Act, 21 U.S.C. section 360bbb-3(b)(1), unless the authorization is terminated or revoked.  Performed at [facility], [address], [place], [place] [address]   MRSA Next Gen by PCR, Nasal     Status: Abnormal   Collection Time: 04/22/21 11:19 AM   Specimen: Nasal Mucosa; Nasal Swab  Result Value Ref Range Status   MRSA by PCR Next Gen DETECTED (A) NOT DETECTED Final    Comment: RESULT CALLED TO, READ BACK BY AND VERIFIED WITH: DOROTHY,H AT 1309 ON 10.26.22 BY RUCINSKI,B (NOTE) The GeneXpert MRSA Assay (FDA approved for NASAL specimens only), is one component of a comprehensive MRSA colonization surveillance program. It is not intended to diagnose MRSA infection nor to guide or monitor treatment for MRSA infections. Test performance is not FDA approved in patients less than 39 years old. Performed at [facility], [address] [place], [place] [address]          Radiology Studies: DG Perc Gastrostomy Tube Insert W/Fluoro  Result Date: 04/24/2021 CLINICAL DATA:  Gastrostomy tube exchange EXAM: PERCUTANEOUS GASTROSTOMY TUBE REMOVAL AND REPLACEMENT CONTRAST:  44mL OMNIPAQUE IOHEXOL 300 MG/ML  SOLN FLUOROSCOPY TIME:  Fluoroscopy Time:  0 minutes 24 seconds Radiation Exposure Index (if provided by the fluoroscopic device): 2.0 mGy Number of Acquired Spot Images: 2 COMPARISON:  03/23/2021 FINDINGS: Indwelling GJ tube was removed and a gastrostomy tube was placed by Dr. Henreitta Leber. Tube checked then performed. Injected contrast opacifies the gastric lumen. No contrast extravasation identified. IMPRESSION: Replaced gastrostomy tube is within gastric lumen without extravasation of contrast. Electronically Signed   By: Ulyses Southward M.D.   On: 04/24/2021 10:39        Scheduled Meds:  Chlorhexidine Gluconate Cloth  6 each Topical Q0600   enoxaparin (LOVENOX) injection  30 mg Subcutaneous Q24H   feeding  supplement (OSMOLITE 1.2 CAL)  1,000 mL Per Tube Q24H   feeding supplement (PROSource TF)  45 mL Per Tube TID   guaiFENesin  15 mL Per Tube TID   mupirocin ointment  1 application Nasal BID   pantoprazole sodium  40 mg Per Tube Daily   Continuous Infusions:     LOS: 3 days    Time spent:    Erick Blinks, MD [facility]   If 7PM-7AM, please contact night-coverage [email or web address]  04/24/2021, 8:15 PM

## 2021-04-24 NOTE — Progress Notes (Signed)
Small amount of yellow drainage noted to be around PEG tube insertion site.  Patient has no c/o pain, N/V or reflux.  Residual amounts:  04/24/2021 at 2047 90 mls 04/25/2021 at 0017 60 mls   Around 0215, dressing noted to have large amount of yellow/light tan drainage.  Patient was c/o pain around the site and abdominal discomfort.  Residual 45 mls. Gauze dressing changed around insertion site and site cleaned with saline.  Dr. Carren Rang notified of pain and drainage.  Okay to hold tube feeds for now.  Morphine ordered for pain.

## 2021-04-24 NOTE — Progress Notes (Addendum)
Nutrition Follow up  DOCUMENTATION CODES:      INTERVENTION:   Currently pt receiving Osmolite 1.2 @ 50/ml/hr continuous via PEG.   At home: transition to nocturnal feeding from 8 pm to 8 am. Osmolite 1.2 @ 100 ml/hr x 12 hr via G-tube.   Free water flushes 200 ml TID per tube.   At goal rate tube feeding provides 1440 kcal, 67 gm protein,  984 ml free water daily + (flushes 600 ml daily to meet fluid goal).  When pt is ready to trial bolus feeds: Osmolite 1.2 - 300 ml QID per tube. Free water as above.  Recommend pt monitor weight trends weekly.   NUTRITION DIAGNOSIS:   Inadequate oral intake related to inability to eat as evidenced by NPO status (enteral feeding dependent).  -progressing   GOAL:  Patient will meet greater than or equal to 90% of their needs  -100% of protein goal met and 70% energy goal  MONITOR:  TF tolerance, Labs, Weight trends   ASSESSMENT: Patient is a 80 yo female with hx of achalasia, dysmotility, malnutrition and G-J tube dependent. Patient has experienced difficulty with tube dysfunction.   Patient cleared to trial using G-tube. Will start Osmolite 1.2 @ 20 ml/hr with protein modular via G-tube.   Patient is NPO. IVF- NS@ 75 ml/hr.  Medications and weights reviewed.   10/27- discussed pt with nursing this morning. No issues with tolerance reported overnight. Will continue to gradually advance rate and adjust protein modular. IVF per MD meeting 100% est fluid needs.  10/28 Patient tolerating tube feeding at goal rate. She is preparing to discharge home later today. Talked with pt and daughters about patient tube feeding regimen. Spoke with Hulan Amato over the telephone and nocturnal feeding option appeals to her. As pt it tolerating nighttime feeding and wants to trial bolus- recommend Osm 1.2 with 300 ml bolus QID via PEG. Flushes free water 200 ml TID. Nursing is providing education for checking residuals and free water flushes.   Labs: BMP  Latest Ref Rng & Units 04/22/2021 04/21/2021 03/07/2021  Glucose 70 - 99 mg/dL 155(H) 87 129(H)  BUN 8 - 23 mg/dL 18 21 6(L)  Creatinine 0.44 - 1.00 mg/dL 0.45 0.43(L) <0.30(L)  Sodium 135 - 145 mmol/L 132(L) 131(L) 130(L)  Potassium 3.5 - 5.1 mmol/L 3.6 4.5 4.6  Chloride 98 - 111 mmol/L 101 98 101  CO2 22 - 32 mmol/L 25 24 25   Calcium 8.9 - 10.3 mg/dL 8.9 9.4 8.4(L)      NUTRITION - FOCUSED PHYSICAL EXAM: Nutrition-Focused physical exam completed. Findings are moderate upper arm fat depletion, moderate clavicle,deltoid muscle depletion, no edema.      Diet Order:   Diet Order             Diet NPO time specified  Diet effective now                   EDUCATION NEEDS:  Education needs have been addressed  Skin:  Skin Assessment: Reviewed RN Assessment  Last BM:  10/26  Height:   Ht Readings from Last 1 Encounters:  04/22/21 5' 1"  (1.549 m)    Weight:   Wt Readings from Last 1 Encounters:  04/24/21 97.6 kg    Ideal Body Weight:   48 kg  BMI:  Body mass index is 40.66 kg/m.  Estimated Nutritional Needs:   Kcal:  1400-1500  Protein:  65-70 gr  Fluid:  1500 ml daily   Colman Cater  MS,RD,CSG,LDN Contact: AMION

## 2021-04-24 NOTE — Progress Notes (Addendum)
[facility]  Patient has been tolerating G tube feeds, she has low residuals and no pain. I discussed with her and her daughter Worthy Rancher, exchange of the GJ to a G tube so we can get rid of the nonfunctioning J tube part. Discussed that I would exchange the tube and a tube study would ensure it was in the appropriate place. They gave verbal consent.   BP 137/75   Pulse 89   Temp 98.6 F (37 C) (Oral)   Resp 17   Ht 5\' 1"  (1.549 m)   Wt 97.6 kg   SpO2 100%   BMI 40.66 kg/m  Soft, nondistended, GJ in place  Procedure:  Taken to Fluoroscopy  Removed GJ balloon water 7cc  Removed GJ tube intact Immediate placed with a 64F Moss Gastrostomy tube 20cc of water put into the balloon Bolster placed to the abdominal wall   Fluoroscopy study via the suction port, which can also be used for feeds and meds perform. G tube in appropriate position.  Can use the G tube. Can do feeds via the purple feeding port or via the port marked continuous suction. Meds via port marked continuous suction port. We will not be doing suction with the continuous suction port but delivering meds/feeds via this.   Will ask the dietitian to give some options/ goals for feeds and trying to get the patient to a goal of not having continuous feeds 24/7.  Will ask dietitian to speak with daughter about going up on rate and decreasing time. Seeing if patient tolerates over a few days and then increasing again. If she can get to bolus feeds that is what the family would desire at home, versus just being off the feeds for some number of hours a day for therapy and other things.  Will see patient in my office to check on her 11/10 to ensure doing well. Patient likely needs [facility] services for tube feeds set up.   Updated daughter, she will need to be taught about the feeds and med delivery. She will need to be taught how to check residuals for going up on the feeds to ensure patient tolerates.  [facility], MD [facility] [address] [place], [place] [phone] (office)

## 2021-04-24 NOTE — Progress Notes (Addendum)
PEG residual: 04/23/2021 at 2000 5 mls 04/24/2021 at 0015 10 mls 04/24/2021 at 0445 15 mls

## 2021-04-25 DIAGNOSIS — K220 Achalasia of cardia: Secondary | ICD-10-CM | POA: Diagnosis not present

## 2021-04-25 DIAGNOSIS — I10 Essential (primary) hypertension: Secondary | ICD-10-CM | POA: Diagnosis not present

## 2021-04-25 DIAGNOSIS — K9423 Gastrostomy malfunction: Secondary | ICD-10-CM | POA: Diagnosis not present

## 2021-04-25 LAB — GLUCOSE, CAPILLARY
Glucose-Capillary: 132 mg/dL — ABNORMAL HIGH (ref 70–99)
Glucose-Capillary: 108 mg/dL — ABNORMAL HIGH (ref 70–99)
Glucose-Capillary: 115 mg/dL — ABNORMAL HIGH (ref 70–99)
Glucose-Capillary: 98 mg/dL (ref 70–99)

## 2021-04-25 MED ORDER — MORPHINE SULFATE (PF) 4 MG/ML IV SOLN: 4.0000 mg | Freq: Once | INTRAVENOUS | Status: DC

## 2021-04-25 MED ORDER — MORPHINE SULFATE (PF) 2 MG/ML IV SOLN: 2.0000 mg | Freq: Once | INTRAVENOUS | Status: DC

## 2021-04-25 NOTE — Progress Notes (Signed)
Gastric residual 100 mL. Advised by Dr. Henreitta Leber to resume tube feeding and monitor leaking at insertion site. Will continue to monitor.

## 2021-04-25 NOTE — Progress Notes (Signed)
PROGRESS NOTE    Kathryn [name]  XIH:[id number] DOB: 1940-11-02 DOA: 04/21/2021 PCP: Suzan Slick, MD    Brief Narrative:  80 year old female with a history of GERD, chronic achalasia, positive ANA, G/J-tube dependent, admitted to the hospital due to G-tube dysfunction.  She has been evaluated by Dr. Henreitta Leber who is familiar with her case.  Attempts have been made to unclog the G-tube.  She may need tube replacement by interventional radiology if unable to have any feeding through G-tube.   Assessment & Plan:   Active Problems:   Gastrostomy tube dysfunction (HCC)   Jejunostomy malfunction (HCC)   G/J-tube dysfunction -Patient with history of achalasia and failure to thrive, G/J-tube dependent -Discussed in detail with Dr. Henreitta Leber, J-tube appears to be occluded at this point -G-tube does appear to be flushing/patent.  Dr. Henreitta Leber recommended that tube feeds be attempted to be given through G-tube.  If patient does not tolerate this, she would need to have G/J-tube replaced by interventional radiology -She has been started on tube feeds through G-tube and appears to be tolerating this thus far.  Tube feeds currently running at goal of 50 cc/h.  She has not had any significant residuals thus far -TOC helping arrange supplies at home so that family can continue tube feeding at home.  Nursing is providing education to family members.  Still waiting for equipment to be set up at home prior to discharge -Seen by speech therapy and felt reasonable to start on ice chips/sips of water for comfort -We will need follow-up with primary gastroenterologist at [facility]  Achalasia with chronic urinary -Followed by rheumatology  Hypertension -Blood pressures currently stable  Anemia -Currently no signs of bleeding -Continue to monitor  GERD -Continue on PPI  DVT prophylaxis: enoxaparin (LOVENOX) injection 30 mg Start: 04/21/21 2100 SCDs Start: 04/21/21 1959  Code Status: DNR Family  Communication: Updated patient's daughter over the phone 10/29. Disposition Plan: Status is: Inpatient  Remains inpatient appropriate because: Continued issues with PEG tube need to be addressed and to ensure that it is functioning appropriately   Consultants:  General surgery  Procedures:    Antimicrobials:      Subjective: Noted to have some leakage of tube feeds around PEG tube overnight.  She denies any complaints at this time.  Objective: Vitals:   04/24/21 1231 04/24/21 2046 04/25/21 0536 04/25/21 1429  BP: 136/74 139/74 132/74 113/62  Pulse: 92 97 100 (!) 57  Resp: 18 16 18 16   Temp: 98.4 F (36.9 C) 98.9 F (37.2 C) 98.2 F (36.8 C) 99 F (37.2 C)  TempSrc:  Oral Oral Oral  SpO2: 100% 100% 100% 100%  Weight:      Height:        Intake/Output Summary (Last 24 hours) at 04/25/2021 1923 Last data filed at 04/25/2021 1300 Gross per 24 hour  Intake --  Output 600 ml  Net -600 ml   Filed Weights   04/22/21 1000 04/22/21 1050 04/24/21 0553  Weight: 49.6 kg 49.6 kg 97.6 kg    Examination:  General exam: Appears calm and comfortable  Respiratory system: Clear to auscultation. Respiratory effort normal. Cardiovascular system: S1 & S2 heard, RRR. No JVD, murmurs, rubs, gallops or clicks. No pedal edema. Gastrointestinal system: Abdomen is nondistended, soft and nontender. No organomegaly or masses felt. Normal bowel sounds heard.  PEG tube in place Central nervous system: Alert and oriented. No focal neurological deficits. Extremities: Symmetric 5 x 5 power. Skin: No rashes, lesions or  ulcers Psychiatry: Judgement and insight appear normal. Mood & affect appropriate.     Data Reviewed: I have personally reviewed following labs and imaging studies  CBC: Recent Labs  Lab 04/21/21 1543 04/22/21 0414  WBC 7.5 4.4  NEUTROABS 6.3  --   HGB 11.7* 9.9*  HCT 36.6 31.4*  MCV 96.1 96.0  PLT 360 275   Basic Metabolic Panel: Recent Labs  Lab 04/21/21 1543  04/22/21 0414  NA 131* 132*  K 4.5 3.6  CL 98 101  CO2 24 25  GLUCOSE 87 155*  BUN 21 18  CREATININE 0.43* 0.45  CALCIUM 9.4 8.9   GFR: Estimated Creatinine Clearance: 57.9 mL/min (by C-G formula based on SCr of 0.45 mg/dL). Liver Function Tests: Recent Labs  Lab 04/22/21 0414  AST 29  ALT 20  ALKPHOS 74  BILITOT 0.5  PROT 6.5  ALBUMIN 2.7*   No results for input(s): LIPASE, AMYLASE in the last 168 hours. No results for input(s): AMMONIA in the last 168 hours. Coagulation Profile: No results for input(s): INR, PROTIME in the last 168 hours. Cardiac Enzymes: No results for input(s): CKTOTAL, CKMB, CKMBINDEX, TROPONINI in the last 168 hours. BNP (last 3 results) No results for input(s): PROBNP in the last 8760 hours. HbA1C: No results for input(s): HGBA1C in the last 72 hours. CBG: Recent Labs  Lab 04/24/21 2347 04/25/21 0448 04/25/21 0748 04/25/21 1204 04/25/21 1729  GLUCAP 102* 132* 108* 115* 98   Lipid Profile: No results for input(s): CHOL, HDL, LDLCALC, TRIG, CHOLHDL, LDLDIRECT in the last 72 hours. Thyroid Function Tests: No results for input(s): TSH, T4TOTAL, FREET4, T3FREE, THYROIDAB in the last 72 hours. Anemia Panel: No results for input(s): VITAMINB12, FOLATE, FERRITIN, TIBC, IRON, RETICCTPCT in the last 72 hours. Sepsis Labs: No results for input(s): PROCALCITON, LATICACIDVEN in the last 168 hours.  Recent Results (from the past 240 hour(s))  Resp Panel by RT-PCR (Flu A&B, Covid) Nasopharyngeal Swab     Status: None   Collection Time: 04/21/21  6:12 PM   Specimen: Nasopharyngeal Swab; Nasopharyngeal(NP) swabs in vial transport medium  Result Value Ref Range Status   SARS Coronavirus 2 by RT PCR NEGATIVE NEGATIVE Final    Comment: (NOTE) SARS-CoV-2 target nucleic acids are NOT DETECTED.  The SARS-CoV-2 RNA is generally detectable in upper respiratory specimens during the acute phase of infection. The lowest concentration of SARS-CoV-2 viral  copies this assay can detect is 138 copies/mL. A negative result does not preclude SARS-Cov-2 infection and should not be used as the sole basis for treatment or other patient management decisions. A negative result may occur with  improper specimen collection/handling, submission of specimen other than nasopharyngeal swab, presence of viral mutation(s) within the areas targeted by this assay, and inadequate number of viral copies(<138 copies/mL). A negative result must be combined with clinical observations, patient history, and epidemiological information. The expected result is Negative.  Fact Sheet for Patients:  BloggerCourse.com  Fact Sheet for Healthcare Providers:  SeriousBroker.it  This test is no t yet approved or cleared by the [place] FDA and  has been authorized for detection and/or diagnosis of SARS-CoV-2 by FDA under an Emergency Use Authorization (EUA). This EUA will remain  in effect (meaning this test can be used) for the duration of the COVID-19 declaration under Section 564(b)(1) of the Act, 21 U.S.C.section 360bbb-3(b)(1), unless the authorization is terminated  or revoked sooner.       Influenza A by PCR NEGATIVE NEGATIVE Final  Influenza B by PCR NEGATIVE NEGATIVE Final    Comment: (NOTE) The Xpert Xpress SARS-CoV-2/FLU/RSV plus assay is intended as an aid in the diagnosis of influenza from Nasopharyngeal swab specimens and should not be used as a sole basis for treatment. Nasal washings and aspirates are unacceptable for Xpert Xpress SARS-CoV-2/FLU/RSV testing.  Fact Sheet for Patients: BloggerCourse.com  Fact Sheet for Healthcare Providers: SeriousBroker.it  This test is not yet approved or cleared by the [place] FDA and has been authorized for detection and/or diagnosis of SARS-CoV-2 by FDA under an Emergency Use Authorization (EUA). This  EUA will remain in effect (meaning this test can be used) for the duration of the COVID-19 declaration under Section 564(b)(1) of the Act, 21 U.S.C. section 360bbb-3(b)(1), unless the authorization is terminated or revoked.  Performed at [facility], [address], [place], [place] [address]   MRSA Next Gen by PCR, Nasal     Status: Abnormal   Collection Time: 04/22/21 11:19 AM   Specimen: Nasal Mucosa; Nasal Swab  Result Value Ref Range Status   MRSA by PCR Next Gen DETECTED (A) NOT DETECTED Final    Comment: RESULT CALLED TO, READ BACK BY AND VERIFIED WITH: DOROTHY,H AT 1309 ON 10.26.22 BY RUCINSKI,B (NOTE) The GeneXpert MRSA Assay (FDA approved for NASAL specimens only), is one component of a comprehensive MRSA colonization surveillance program. It is not intended to diagnose MRSA infection nor to guide or monitor treatment for MRSA infections. Test performance is not FDA approved in patients less than 54 years old. Performed at [facility], [address], [place], [place] [address]          Radiology Studies: DG Perc Gastrostomy Tube Insert W/Fluoro  Result Date: 04/24/2021 CLINICAL DATA:  Gastrostomy tube exchange EXAM: PERCUTANEOUS GASTROSTOMY TUBE REMOVAL AND REPLACEMENT CONTRAST:  41mL OMNIPAQUE IOHEXOL 300 MG/ML  SOLN FLUOROSCOPY TIME:  Fluoroscopy Time:  0 minutes 24 seconds Radiation Exposure Index (if provided by the fluoroscopic device): 2.0 mGy Number of Acquired Spot Images: 2 COMPARISON:  03/23/2021 FINDINGS: Indwelling GJ tube was removed and a gastrostomy tube was placed by Dr. Henreitta Leber. Tube checked then performed. Injected contrast opacifies the gastric lumen. No contrast extravasation identified. IMPRESSION: Replaced gastrostomy tube is within gastric lumen without extravasation of contrast. Electronically Signed   By: Ulyses Southward M.D.   On: 04/24/2021 10:39        Scheduled Meds:  Chlorhexidine Gluconate Cloth  6 each Topical Q0600   enoxaparin  (LOVENOX) injection  30 mg Subcutaneous Q24H   feeding supplement (OSMOLITE 1.2 CAL)  1,000 mL Per Tube Q24H   feeding supplement (PROSource TF)  45 mL Per Tube TID   guaiFENesin  15 mL Per Tube TID    morphine injection  2 mg Intravenous Once   mupirocin ointment  1 application Nasal BID   pantoprazole sodium  40 mg Per Tube Daily   Continuous Infusions:     LOS: 4 days    Time spent:    Erick Blinks, MD [facility]   If 7PM-7AM, please contact night-coverage [email or web address]  04/25/2021, 7:23 PM

## 2021-04-25 NOTE — Progress Notes (Signed)
Gastric residual 57mL.

## 2021-04-25 NOTE — TOC Transition Note (Signed)
Transition of Care [facility]) - CM/SW Discharge Note   Patient Details  Name: Kathryn [name] MRN: [id number] Date of Birth: Nov 14, 1940  Transition of Care [facility] CM/SW Contact:  Barry Brunner, LCSW Phone Number: 04/25/2021, 12:22 PM   Clinical Narrative:    CSW notified of patient's readiness for discharge and recommendation for [facility]. Patient agreeable to [facility]. [facility] to [facility] agreeable to provide [facility]. TOC signing off.   Final next level of care: Home w Home Health Services Barriers to Discharge: Barriers Resolved   Patient Goals and CMS Choice Patient states their goals for this hospitalization and ongoing recovery are:: Return home with [facility] CMS Medicare.gov Compare Post Acute Care list provided to:: Patient Choice offered to / list presented to : Patient  Discharge Placement                    Patient and family notified of of transfer: 04/25/21  Discharge Plan and Services     Post Acute Care Choice: Home Health                    HH Arranged: RN [facility] Agency: [facility] Date HH Agency Contacted: 04/25/21 Time HH Agency Contacted: 1221 Representative spoke with at [facility] Agency: Barbara Cower  Social Determinants of Health (SDOH) Interventions     Readmission Risk Interventions Readmission Risk Prevention Plan 02/03/2021  Transportation Screening Complete  PCP or Specialist Appt within 3-5 Days Complete  HRI or Home Care Consult Complete  Social Work Consult for Recovery Care Planning/Counseling Complete  Palliative Care Screening Not Applicable  Medication Review Oceanographer) Complete  Some recent data might be hidden

## 2021-04-25 NOTE — Progress Notes (Signed)
Gastric residual 67mL.

## 2021-04-26 DIAGNOSIS — K9413 Enterostomy malfunction: Secondary | ICD-10-CM

## 2021-04-26 LAB — GLUCOSE, CAPILLARY
Glucose-Capillary: 102 mg/dL — ABNORMAL HIGH (ref 70–99)
Glucose-Capillary: 115 mg/dL — ABNORMAL HIGH (ref 70–99)
Glucose-Capillary: 123 mg/dL — ABNORMAL HIGH (ref 70–99)
Glucose-Capillary: 128 mg/dL — ABNORMAL HIGH (ref 70–99)
Glucose-Capillary: 136 mg/dL — ABNORMAL HIGH (ref 70–99)
Glucose-Capillary: 94 mg/dL (ref 70–99)

## 2021-04-26 MED ORDER — OSMOLITE 1.2 CAL PO LIQD: 1000.0000 mL | ORAL | 0 refills | Status: DC

## 2021-04-26 MED ORDER — GUAIFENESIN 100 MG/5ML PO LIQD
15.0000 mL | Freq: Three times a day (TID) | ORAL | 0 refills | Status: DC
Start: 2021-04-26 — End: 2021-05-07

## 2021-04-26 MED ORDER — ACETAMINOPHEN 160 MG/5ML PO SOLN
650.0000 mg | Freq: Four times a day (QID) | ORAL | Status: DC | PRN
  Administered 2021-04-26: 650 mg
  Filled 2021-04-26: qty 20.3

## 2021-04-26 MED ORDER — ACETAMINOPHEN 160 MG/5ML PO SOLN: 650.0000 mg | Freq: Four times a day (QID) | ORAL | 0 refills | Status: DC | PRN

## 2021-04-26 MED ORDER — FREE WATER: 200.0000 mL | Freq: Three times a day (TID) | 2 refills | Status: DC

## 2021-04-26 NOTE — Progress Notes (Signed)
Pt assisted up to recliner with standby assistance, tolerated well. Pt states she does walk some at SNF using walker. TF continues, pt with call bell and yankaur sx within reach. Pt denies c/o.

## 2021-04-26 NOTE — Progress Notes (Signed)
PROGRESS NOTE    Kathryn [name]  YOM:[id number] DOB: Oct 17, 1940 DOA: 04/21/2021 PCP: Suzan Slick, MD    Brief Narrative:  80 year old female with a history of GERD, chronic achalasia, positive ANA, G/J-tube dependent, admitted to the hospital due to G-tube dysfunction.  She has been evaluated by Dr. Henreitta Leber who is familiar with her case.  Attempts have been made to unclog the G-tube.  She may need tube replacement by interventional radiology if unable to have any feeding through G-tube.   Assessment & Plan:   Active Problems:   Gastrostomy tube dysfunction (HCC)   Jejunostomy malfunction (HCC)   G/J-tube dysfunction -Patient with history of achalasia and failure to thrive, G/J-tube dependent -Discussed in detail with Dr. Henreitta Leber, J-tube appears to be occluded at this point -G-tube does appear to be flushing/patent.  Dr. Henreitta Leber recommended that tube feeds be attempted to be given through G-tube.  If patient does not tolerate this, she would need to have G/J-tube replaced by interventional radiology -She has been started on tube feeds through G-tube and appears to be tolerating this thus far.  Tube feeds currently running at goal of 50 cc/h.  She has not had any significant residuals thus far -TOC helping arrange supplies at home so that family can continue tube feeding at home.  Nursing is providing education to family members.  Still waiting for equipment to be set up at home prior to discharge -Seen by speech therapy and felt reasonable to start on ice chips/sips of water for comfort -We will need follow-up with primary gastroenterologist at [facility]  Achalasia with chronic urinary -Followed by rheumatology  Hypertension -Blood pressures currently stable  Anemia -Currently no signs of bleeding -Continue to monitor  GERD -Continue on PPI  DVT prophylaxis: enoxaparin (LOVENOX) injection 30 mg Start: 04/21/21 2100 SCDs Start: 04/21/21 1959  Code Status: DNR Family  Communication: Updated patient's daughter over the phone 10/30. Disposition Plan: Status is: Inpatient  Remains inpatient appropriate because: Patient is stable for discharge once appropriate disposition/resources can be arranged   Consultants:  General surgery  Procedures:    Antimicrobials:      Subjective: No complaints at this time  Objective: Vitals:   04/25/21 2119 04/26/21 0524 04/26/21 1355 04/26/21 2107  BP: 125/73 133/77 131/79 131/70  Pulse: 90 93 96 90  Resp: 18 19  19   Temp: 98.8 F (37.1 C) 98.5 F (36.9 C) 98.2 F (36.8 C) 98 F (36.7 C)  TempSrc: Oral Oral Oral Oral  SpO2: 99% 100% 100% 100%  Weight:      Height:        Intake/Output Summary (Last 24 hours) at 04/26/2021 2127 Last data filed at 04/26/2021 04/28/2021 Gross per 24 hour  Intake --  Output 1150 ml  Net -1150 ml   Filed Weights   04/22/21 1000 04/22/21 1050 04/24/21 0553  Weight: 49.6 kg 49.6 kg 97.6 kg    Examination:  General exam: Appears calm and comfortable  Respiratory system: Clear to auscultation. Respiratory effort normal. Cardiovascular system: S1 & S2 heard, RRR. No JVD, murmurs, rubs, gallops or clicks. No pedal edema. Gastrointestinal system: Abdomen is nondistended, soft and nontender. No organomegaly or masses felt. Normal bowel sounds heard.  PEG tube in place Central nervous system: Alert and oriented. No focal neurological deficits. Extremities: Symmetric 5 x 5 power. Skin: No rashes, lesions or ulcers Psychiatry: Judgement and insight appear normal. Mood & affect appropriate.     Data Reviewed: I have personally reviewed following  labs and imaging studies  CBC: Recent Labs  Lab 04/21/21 1543 04/22/21 0414  WBC 7.5 4.4  NEUTROABS 6.3  --   HGB 11.7* 9.9*  HCT 36.6 31.4*  MCV 96.1 96.0  PLT 360 275   Basic Metabolic Panel: Recent Labs  Lab 04/21/21 1543 04/22/21 0414  NA 131* 132*  K 4.5 3.6  CL 98 101  CO2 24 25  GLUCOSE 87 155*  BUN 21 18   CREATININE 0.43* 0.45  CALCIUM 9.4 8.9   GFR: Estimated Creatinine Clearance: 57.9 mL/min (by C-G formula based on SCr of 0.45 mg/dL). Liver Function Tests: Recent Labs  Lab 04/22/21 0414  AST 29  ALT 20  ALKPHOS 74  BILITOT 0.5  PROT 6.5  ALBUMIN 2.7*   No results for input(s): LIPASE, AMYLASE in the last 168 hours. No results for input(s): AMMONIA in the last 168 hours. Coagulation Profile: No results for input(s): INR, PROTIME in the last 168 hours. Cardiac Enzymes: No results for input(s): CKTOTAL, CKMB, CKMBINDEX, TROPONINI in the last 168 hours. BNP (last 3 results) No results for input(s): PROBNP in the last 8760 hours. HbA1C: No results for input(s): HGBA1C in the last 72 hours. CBG: Recent Labs  Lab 04/26/21 0427 04/26/21 0756 04/26/21 1148 04/26/21 1647 04/26/21 2113  GLUCAP 115* 136* 102* 123* 94   Lipid Profile: No results for input(s): CHOL, HDL, LDLCALC, TRIG, CHOLHDL, LDLDIRECT in the last 72 hours. Thyroid Function Tests: No results for input(s): TSH, T4TOTAL, FREET4, T3FREE, THYROIDAB in the last 72 hours. Anemia Panel: No results for input(s): VITAMINB12, FOLATE, FERRITIN, TIBC, IRON, RETICCTPCT in the last 72 hours. Sepsis Labs: No results for input(s): PROCALCITON, LATICACIDVEN in the last 168 hours.  Recent Results (from the past 240 hour(s))  Resp Panel by RT-PCR (Flu A&B, Covid) Nasopharyngeal Swab     Status: None   Collection Time: 04/21/21  6:12 PM   Specimen: Nasopharyngeal Swab; Nasopharyngeal(NP) swabs in vial transport medium  Result Value Ref Range Status   SARS Coronavirus 2 by RT PCR NEGATIVE NEGATIVE Final    Comment: (NOTE) SARS-CoV-2 target nucleic acids are NOT DETECTED.  The SARS-CoV-2 RNA is generally detectable in upper respiratory specimens during the acute phase of infection. The lowest concentration of SARS-CoV-2 viral copies this assay can detect is 138 copies/mL. A negative result does not preclude  SARS-Cov-2 infection and should not be used as the sole basis for treatment or other patient management decisions. A negative result may occur with  improper specimen collection/handling, submission of specimen other than nasopharyngeal swab, presence of viral mutation(s) within the areas targeted by this assay, and inadequate number of viral copies(<138 copies/mL). A negative result must be combined with clinical observations, patient history, and epidemiological information. The expected result is Negative.  Fact Sheet for Patients:  BloggerCourse.com  Fact Sheet for Healthcare Providers:  SeriousBroker.it  This test is no t yet approved or cleared by the [place] FDA and  has been authorized for detection and/or diagnosis of SARS-CoV-2 by FDA under an Emergency Use Authorization (EUA). This EUA will remain  in effect (meaning this test can be used) for the duration of the COVID-19 declaration under Section 564(b)(1) of the Act, 21 U.S.C.section 360bbb-3(b)(1), unless the authorization is terminated  or revoked sooner.       Influenza A by PCR NEGATIVE NEGATIVE Final   Influenza B by PCR NEGATIVE NEGATIVE Final    Comment: (NOTE) The Xpert Xpress SARS-CoV-2/FLU/RSV plus assay is intended as an  aid in the diagnosis of influenza from Nasopharyngeal swab specimens and should not be used as a sole basis for treatment. Nasal washings and aspirates are unacceptable for Xpert Xpress SARS-CoV-2/FLU/RSV testing.  Fact Sheet for Patients: BloggerCourse.com  Fact Sheet for Healthcare Providers: SeriousBroker.it  This test is not yet approved or cleared by the [place] FDA and has been authorized for detection and/or diagnosis of SARS-CoV-2 by FDA under an Emergency Use Authorization (EUA). This EUA will remain in effect (meaning this test can be used) for the duration of  the COVID-19 declaration under Section 564(b)(1) of the Act, 21 U.S.C. section 360bbb-3(b)(1), unless the authorization is terminated or revoked.  Performed at [facility], [address], [place], [place] [address]   MRSA Next Gen by PCR, Nasal     Status: Abnormal   Collection Time: 04/22/21 11:19 AM   Specimen: Nasal Mucosa; Nasal Swab  Result Value Ref Range Status   MRSA by PCR Next Gen DETECTED (A) NOT DETECTED Final    Comment: RESULT CALLED TO, READ BACK BY AND VERIFIED WITH: DOROTHY,H AT 1309 ON 10.26.22 BY RUCINSKI,B (NOTE) The GeneXpert MRSA Assay (FDA approved for NASAL specimens only), is one component of a comprehensive MRSA colonization surveillance program. It is not intended to diagnose MRSA infection nor to guide or monitor treatment for MRSA infections. Test performance is not FDA approved in patients less than 37 years old. Performed at [facility], [address] [place], [place] [address]          Radiology Studies: No results found.      Scheduled Meds:  Chlorhexidine Gluconate Cloth  6 each Topical Q0600   enoxaparin (LOVENOX) injection  30 mg Subcutaneous Q24H   feeding supplement (OSMOLITE 1.2 CAL)  1,000 mL Per Tube Q24H   feeding supplement (PROSource TF)  45 mL Per Tube TID   guaiFENesin  15 mL Per Tube TID    morphine injection  2 mg Intravenous Once   mupirocin ointment  1 application Nasal BID   pantoprazole sodium  40 mg Per Tube Daily   Continuous Infusions:     LOS: 5 days    Time spent:    Erick Blinks, MD [facility]   If 7PM-7AM, please contact night-coverage [email or web address]  04/26/2021, 9:27 PM

## 2021-04-26 NOTE — Progress Notes (Signed)
Residual amounts: 1215: 25 ml 1545: 0 ml

## 2021-04-26 NOTE — Clinical Social Work Note (Signed)
Patient's daughter reported that she has not checked patient's home to see if pump was delivered. According to [facility] with [employer], Pump was delivered between 4p and 6pm 04/25/21. Patient's daughter inquired about teachings for tube feeding. CSW informed patient's daughter that hospital RN and Home health RN would assist in teaching. CSW notified [employer] and [facility] of patient's expected discharge today.

## 2021-04-26 NOTE — Progress Notes (Signed)
Pt's daughter states that she is not comfortable taking pt home tonight as she is unfamiliar with the pump and the feedings and would prefer for [facility] nurse to be present to walk her through the initial set-up. MD Memon notified.

## 2021-04-26 NOTE — Progress Notes (Signed)
Pt alert and oriented x4, denies c/o. Upper airway congestion noted, pt with congested cough and phlegm, using yankaur suction to remove secretions. Tube feeding infusing per order, residual noted at 50 ml. Tube insertion site with moderate amount of sticky, yellowish colored drainage noted on guaze. Site cleaned and new guaze placed. Pt denies any needs at this time. Call bell within reach, bed alarm on for safety.

## 2021-04-26 NOTE — Progress Notes (Signed)
Residuals:  02/23/2021 at 2024 70 mls 02/24/2021 at 0047 50 mls 02/24/2021 at 0427 80 mls

## 2021-04-26 NOTE — Discharge Instructions (Signed)
Start tube feeding at 50/ml once at  home and go up by 31ml every day until goal of 100 ml/hr.

## 2021-04-27 DIAGNOSIS — Z931 Gastrostomy status: Secondary | ICD-10-CM

## 2021-04-27 DIAGNOSIS — T85528A Displacement of other gastrointestinal prosthetic devices, implants and grafts, initial encounter: Secondary | ICD-10-CM

## 2021-04-27 LAB — GLUCOSE, CAPILLARY
Glucose-Capillary: 109 mg/dL — ABNORMAL HIGH (ref 70–99)
Glucose-Capillary: 117 mg/dL — ABNORMAL HIGH (ref 70–99)
Glucose-Capillary: 124 mg/dL — ABNORMAL HIGH (ref 70–99)
Glucose-Capillary: 98 mg/dL (ref 70–99)

## 2021-04-27 MED ORDER — OSMOLITE 1.2 CAL PO LIQD: 1000.0000 mL | ORAL | 0 refills | Status: DC

## 2021-04-27 MED ORDER — OSMOLITE 1.5 CAL PO LIQD
1000.0000 mL | ORAL | Status: DC
  Administered 2021-04-27: 1000 mL

## 2021-04-27 MED ORDER — FREE WATER: 280.0000 mL | Freq: Three times a day (TID) | 2 refills | Status: DC

## 2021-04-27 MED ORDER — FREE WATER
280.0000 mL | Freq: Three times a day (TID) | Status: DC
  Administered 2021-04-27: 280 mL

## 2021-04-27 NOTE — Progress Notes (Signed)
[facility]  Doing well and tolerating feeds. Low residuals. No more major drainage around G tube site.  BP 120/66 (BP Location: Right Arm)   Pulse 62   Temp 98.4 F (36.9 C)   Resp 16   Ht 5\' 1"  (1.549 m)   Wt 97.6 kg   SpO2 100%   BMI 40.66 kg/m  G tube in place  Will see in office next week. Home once equipment available Dietitian had talked to daughter about how to increase rate and trialing to see if night feeds or bolus were eventually possible.   Future Appointments  Date Time Provider Department Center  05/07/2021  2:45 PM 13/03/2021, MD [facility] None    Lucretia Roers, MD [facility] [address] [place], [place] [phone] (office)

## 2021-04-27 NOTE — Progress Notes (Addendum)
Nutrition Follow up  DOCUMENTATION CODES:      INTERVENTION:   Transition patient to Osmolite 1.5 @ 60 ml/hr until discharge later today.  Free water for tube today-280 ml TID (840 ml/d).   At home:  Day 1- nocturnal feeding from 7 pm to 8 am. Osmolite 1.2 @ 70 ml/hr x 13 hr via G-tube.  Day 2- nocturnal feeding from 7 pm to 8 am  Osmolite 1.2 @ 80 ml/hr x 13 hr via G-tube Day 3- Nocturnal feeding from 8 pm to 8 am Osmolite 1.2 @ 100 ml/hr (goal 1200 ml daily) x 12 hrs via G-tube.  Free water flushes 200 ml TID per tube.   At goal rate tube feeding provides 1440 kcal, 67 gm protein,  984 ml free water daily + (flushes 600 ml daily to meet fluid goal).  When pt is ready to trial bolus feeds: Osmolite 1.2 - 300 ml QID per tube. Free water as above.  Recommend pt monitor weight trends weekly.   NUTRITION DIAGNOSIS:   Inadequate oral intake related to inability to eat as evidenced by NPO status (enteral feeding dependent).  -progressing   GOAL:  Patient will meet greater than or equal to 90% of their needs  -100% of protein goal met and 70% energy goal  MONITOR:  TF tolerance, Labs, Weight trends   ASSESSMENT: Patient is a 80 yo female with hx of achalasia, dysmotility, malnutrition and G-J tube dependent. Patient has experienced difficulty with tube dysfunction.   Patient cleared to trial using G-tube. Will start Osmolite 1.2 @ 20 ml/hr with protein modular via G-tube.   Patient is NPO. IVF- NS@ 75 ml/hr.  Medications and weights reviewed.   10/27- discussed pt with nursing this morning. No issues with tolerance reported overnight. Will continue to gradually advance rate and adjust protein modular. IVF per MD meeting 100% est fluid needs.  10/28 Patient tolerating tube feeding at goal rate. She is preparing to discharge home later today. Talked with pt and daughters about patient tube feeding regimen. Spoke with Hulan Amato over the telephone and nocturnal feeding option  appeals to her. As pt it tolerating nighttime feeding and wants to trial bolus- recommend Osm 1.2 with 300 ml bolus QID via PEG. Flushes free water 200 ml TID. Nursing is providing education for checking residuals and free water flushes.   10/31 Patient tolerating tube feeding with residuals  and is ready for discharge. Substituting Osmolite 1.5 and adjusting rate due to out of stock Osm 1.2. Have discussed with nursing and MD is aware. Gradual advancement of rate for home nocturnal feeding to monitor tolerance noted above.    CBG (last 3)  Recent Labs    04/27/21 0421 04/27/21 0710 04/27/21 1100  GLUCAP 109* 98 117*    Labs: BMP Latest Ref Rng & Units 04/22/2021 04/21/2021 03/07/2021  Glucose 70 - 99 mg/dL 155(H) 87 129(H)  BUN 8 - 23 mg/dL 18 21 6(L)  Creatinine 0.44 - 1.00 mg/dL 0.45 0.43(L) <0.30(L)  Sodium 135 - 145 mmol/L 132(L) 131(L) 130(L)  Potassium 3.5 - 5.1 mmol/L 3.6 4.5 4.6  Chloride 98 - 111 mmol/L 101 98 101  CO2 22 - 32 mmol/L _0 Calcium 8.9 - 10.3 mg/dL 8.9 9.4 8.4(L)      NUTRITION - FOCUSED PHYSICAL EXAM: Nutrition-Focused physical exam completed. Findings are moderate upper arm fat depletion, moderate clavicle,deltoid muscle depletion, no edema.      Diet Order:   Diet Order  Diet - low sodium heart healthy           Diet NPO time specified  Diet effective now                   EDUCATION NEEDS:  Education needs have been addressed  Skin:  Skin Assessment: Reviewed RN Assessment  Last BM:  10/26  Height:   Ht Readings from Last 1 Encounters:  04/22/21 _0  (1.549 m)    Weight:   Wt Readings from Last 1 Encounters:  04/24/21 97.6 kg    Ideal Body Weight:   48 kg  BMI:  Body mass index is 40.66 kg/m.  Estimated Nutritional Needs:   Kcal:  1400-1500  Protein:  65-70 gr  Fluid:  1500 ml daily   Colman Cater MS,RD,CSG,LDN Contact: Shea Evans

## 2021-04-27 NOTE — Progress Notes (Addendum)
0830 residual 10 mls 0000 residual 0 mls 0430 residual 0 mls.  No nausea. Small amount of diarrhea and drainage around peg site.

## 2021-04-27 NOTE — Care Management Important Message (Signed)
Important Message  Patient Details  Name: Kathryn [name] MRN: [id number] Date of Birth: 09-07-1940   Medicare Important Message Given:  Yes     Corey Harold 04/27/2021, 2:06 PM

## 2021-04-27 NOTE — Discharge Summary (Signed)
Physician Discharge Summary  Kathryn [name] YSA:[id number] DOB: Nov 23, 1940 DOA: 04/21/2021  PCP: Suzan Slick, MD  Admit date: 04/21/2021 Discharge date: 04/27/2021  Admitted From: Skilled nursing facility Disposition: Home  Recommendations for Outpatient Follow-up:  Follow up with PCP in 1-2 weeks Please obtain BMP/CBC in one week Follow-up with general surgery has been scheduled  Home Health: Home health RN has been set up Equipment/Devices: Equipment for tube feeds has been arranged  Discharge Condition: Stable CODE STATUS: DNR Diet recommendation: Tube feedings  Brief/Interim Summary: 80 year old female with a history of GERD, chronic achalasia, positive ANA, G/J-tube dependent, admitted to the hospital due to G-tube dysfunction.  She has been evaluated by Dr. Henreitta Leber who is familiar with her case.  Attempts have been made to unclog the G-tube.  She may need tube replacement by interventional radiology if unable to have any feeding through G-tube.  Discharge Diagnoses:  Active Problems:   Gastrostomy tube dysfunction (HCC)   Jejunostomy malfunction (HCC)  G/J-tube dysfunction -Patient with history of achalasia and failure to thrive, G/J-tube dependent -Discussed in detail with Dr. Henreitta Leber, J-tube appears to be occluded at this point -G-tube does appear to be flushing/patent.  Dr. Henreitta Leber recommended that tube feeds be attempted to be given through G-tube.  I the patient tolerated this. -Subsequently, GJ tube exchanged out for G-tube.  Tube feeds currently running at goal of 50 cc/h.  She has not had any significant residuals thus far.  She has been advised to increase tube feeds by 10 cc/h/day to goal of 100 cc/h which will run from 8 PM to 8 AM -TOC helping arrange supplies at home so that family can continue tube feeding at home.  Nursing is providing education to family members.  Equipment has been set up at home and she is stable for discharge -Seen by speech therapy  and felt reasonable to start on ice chips/sips of water for comfort -Will need follow-up with primary gastroenterologist at [facility]   Achalasia with chronic ANA -Followed by rheumatology   Hypertension -Blood pressures currently stable   Anemia -Currently no signs of bleeding -Continue to monitor   GERD -Continue on PPI  Discharge Instructions  Discharge Instructions     Diet - low sodium heart healthy   Complete by: As directed    Increase activity slowly   Complete by: As directed       Allergies as of 04/27/2021       Reactions   Cyclobenzaprine Hcl Swelling        Medication List     STOP taking these medications    acetaminophen 325 MG tablet Commonly known as: TYLENOL Replaced by: acetaminophen 160 MG/5ML solution   insulin lispro 100 UNIT/ML KwikPen Commonly known as: HumaLOG KwikPen   sodium chloride 1 g tablet       TAKE these medications    acetaminophen 160 MG/5ML solution Commonly known as: TYLENOL Place 20.3 mLs (650 mg total) into feeding tube every 6 (six) hours as needed for mild pain. Replaces: acetaminophen 325 MG tablet   ARTIFICIAL TEARS OP Apply 2 drops to eye in the morning, at noon, in the evening, and at bedtime. Dry eye syndrome   aspirin 81 MG chewable tablet Place 81 mg into feeding tube daily.   feeding supplement (PROSource TF) liquid Place 45 mLs into feeding tube daily. What changed: Another medication with the same name was changed. Make sure you understand how and when to take each.   feeding supplement (OSMOLITE  1.2 CAL) Liqd Place 1,000 mLs into feeding tube daily. Tube feeding to run at 93ml/hr from 8pm to 8am, increase rate by 92ml/hr every 24 hours until goal of 153ml/hr is reached. What changed:  when to take this additional instructions   free water Soln Place 280 mLs into feeding tube in the morning, at noon, and at bedtime. What changed:  how much to take when to take this   guaiFENesin 100  MG/5ML liquid Commonly known as: ROBITUSSIN Place 15 mLs into feeding tube 3 (three) times daily.   HYDROCODONE-ACETAMINOPHEN PO Give 7.5-325 mg by tube every 6 (six) hours as needed (pain). 10 mls via gtube   lansoprazole 3 mg/ml Susp oral suspension Commonly known as: PREVACID Place 15 mg into feeding tube daily.   triamcinolone ointment 0.1 % Commonly known as: KENALOG Apply 1 application topically daily as needed (skin irritation).        Contact information for follow-up providers     Lucretia Roers, MD Follow up on 05/07/2021.   Specialty: General Surgery Why: g tube followup Contact information: [address] [place] [facility] [address] [phone]         [facility] Follow up.   Why: [facility] Contact information: [address] [place] [address] [phone]             Contact information for after-discharge care     Destination     [facility] .   Service: Skilled Nursing Contact information: [address] [place] [address] [phone]                    Allergies  Allergen Reactions   Cyclobenzaprine Hcl Swelling    Consultations: General surgery   Procedures/Studies: DG Perc Gastrostomy Tube Insert W/Fluoro  Result Date: 04/24/2021 CLINICAL DATA:  Gastrostomy tube exchange EXAM: PERCUTANEOUS GASTROSTOMY TUBE REMOVAL AND REPLACEMENT CONTRAST:  50mL OMNIPAQUE IOHEXOL 300 MG/ML  SOLN FLUOROSCOPY TIME:  Fluoroscopy Time:  0 minutes 24 seconds Radiation Exposure Index (if provided by the fluoroscopic device): 2.0 mGy Number of Acquired Spot Images: 2 COMPARISON:  03/23/2021 FINDINGS: Indwelling GJ tube was removed and a gastrostomy tube was placed by Dr. Henreitta Leber. Tube checked then performed. Injected contrast opacifies the gastric lumen. No contrast extravasation identified. IMPRESSION: Replaced gastrostomy tube is within gastric lumen  without extravasation of contrast. Electronically Signed   By: Ulyses Southward M.D.   On: 04/24/2021 10:39      Subjective: No new complaints.  No vomiting.  Discharge Exam: Vitals:   04/26/21 1355 04/26/21 2107 04/27/21 0454 04/27/21 1337  BP: 131/79 131/70 120/66 108/64  Pulse: 96 90 62 94  Resp:  Temp: 98.2 F (36.8 C) 98 F (36.7 C) 98.4 F (36.9 C) 98.7 F (37.1 C)  TempSrc: Oral Oral    SpO2: 100% 100% 100% 100%  Weight:      Height:        General: Pt is alert, awake, not in acute distress Cardiovascular: RRR, S1/S2 +, no rubs, no gallops Respiratory: CTA bilaterally, no wheezing, no rhonchi Abdominal: Soft, NT, ND, bowel sounds + Extremities: no edema, no cyanosis    The results of significant diagnostics from this hospitalization (including imaging, microbiology, ancillary and laboratory) are listed below for reference.     Microbiology: Recent Results (from the past 240 hour(s))  Resp Panel by RT-PCR (Flu A&B, Covid) Nasopharyngeal Swab  Status: None   Collection Time: 04/21/21  6:12 PM   Specimen: Nasopharyngeal Swab; Nasopharyngeal(NP) swabs in vial transport medium  Result Value Ref Range Status   SARS Coronavirus 2 by RT PCR NEGATIVE NEGATIVE Final    Comment: (NOTE) SARS-CoV-2 target nucleic acids are NOT DETECTED.  The SARS-CoV-2 RNA is generally detectable in upper respiratory specimens during the acute phase of infection. The lowest concentration of SARS-CoV-2 viral copies this assay can detect is 138 copies/mL. A negative result does not preclude SARS-Cov-2 infection and should not be used as the sole basis for treatment or other patient management decisions. A negative result may occur with  improper specimen collection/handling, submission of specimen other than nasopharyngeal swab, presence of viral mutation(s) within the areas targeted by this assay, and inadequate number of viral copies(<138 copies/mL). A negative result must be  combined with clinical observations, patient history, and epidemiological information. The expected result is Negative.  Fact Sheet for Patients:  BloggerCourse.com  Fact Sheet for Healthcare Providers:  SeriousBroker.it  This test is no t yet approved or cleared by the [place] FDA and  has been authorized for detection and/or diagnosis of SARS-CoV-2 by FDA under an Emergency Use Authorization (EUA). This EUA will remain  in effect (meaning this test can be used) for the duration of the COVID-19 declaration under Section 564(b)(1) of the Act, 21 U.S.C.section 360bbb-3(b)(1), unless the authorization is terminated  or revoked sooner.       Influenza A by PCR NEGATIVE NEGATIVE Final   Influenza B by PCR NEGATIVE NEGATIVE Final    Comment: (NOTE) The Xpert Xpress SARS-CoV-2/FLU/RSV plus assay is intended as an aid in the diagnosis of influenza from Nasopharyngeal swab specimens and should not be used as a sole basis for treatment. Nasal washings and aspirates are unacceptable for Xpert Xpress SARS-CoV-2/FLU/RSV testing.  Fact Sheet for Patients: BloggerCourse.com  Fact Sheet for Healthcare Providers: SeriousBroker.it  This test is not yet approved or cleared by the [place] FDA and has been authorized for detection and/or diagnosis of SARS-CoV-2 by FDA under an Emergency Use Authorization (EUA). This EUA will remain in effect (meaning this test can be used) for the duration of the COVID-19 declaration under Section 564(b)(1) of the Act, 21 U.S.C. section 360bbb-3(b)(1), unless the authorization is terminated or revoked.  Performed at [facility], [address], [place], [place] [address]   MRSA Next Gen by PCR, Nasal     Status: Abnormal   Collection Time: 04/22/21 11:19 AM   Specimen: Nasal Mucosa; Nasal Swab  Result Value Ref Range Status   MRSA by PCR Next Gen  DETECTED (A) NOT DETECTED Final    Comment: RESULT CALLED TO, READ BACK BY AND VERIFIED WITH: DOROTHY,H AT 1309 ON 10.26.22 BY RUCINSKI,B (NOTE) The GeneXpert MRSA Assay (FDA approved for NASAL specimens only), is one component of a comprehensive MRSA colonization surveillance program. It is not intended to diagnose MRSA infection nor to guide or monitor treatment for MRSA infections. Test performance is not FDA approved in patients less than 91 years old. Performed at [facility], [address], [place], [place] [address]      Labs: BNP (last 3 results) No results for input(s): BNP in the last 8760 hours. Basic Metabolic Panel: Recent Labs  Lab 04/21/21 1543 04/22/21 0414  NA 131* 132*  K 4.5 3.6  CL 98 101  CO2 24 25  GLUCOSE 87 155*  BUN 21 18  CREATININE 0.43* 0.45  CALCIUM 9.4  8.9   Liver Function Tests: Recent Labs  Lab 04/22/21 0414  AST 29  ALT 20  ALKPHOS 74  BILITOT 0.5  PROT 6.5  ALBUMIN 2.7*   No results for input(s): LIPASE, AMYLASE in the last 168 hours. No results for input(s): AMMONIA in the last 168 hours. CBC: Recent Labs  Lab 04/21/21 1543 04/22/21 0414  WBC 7.5 4.4  NEUTROABS 6.3  --   HGB 11.7* 9.9*  HCT 36.6 31.4*  MCV 96.1 96.0  PLT 360 275   Cardiac Enzymes: No results for input(s): CKTOTAL, CKMB, CKMBINDEX, TROPONINI in the last 168 hours. BNP: Invalid input(s): POCBNP CBG: Recent Labs  Lab 04/26/21 2113 04/27/21 0007 04/27/21 0421 04/27/21 0710 04/27/21 1100  GLUCAP 94 124* 109* 98 117*   D-Dimer No results for input(s): DDIMER in the last 72 hours. Hgb A1c No results for input(s): HGBA1C in the last 72 hours. Lipid Profile No results for input(s): CHOL, HDL, LDLCALC, TRIG, CHOLHDL, LDLDIRECT in the last 72 hours. Thyroid function studies No results for input(s): TSH, T4TOTAL, T3FREE, THYROIDAB in the last 72 hours.  Invalid input(s): FREET3 Anemia work up No results for input(s): VITAMINB12, FOLATE, FERRITIN,  TIBC, IRON, RETICCTPCT in the last 72 hours. Urinalysis    Component Value Date/Time   COLORURINE AMBER (A) 01/13/2021 2309   APPEARANCEUR CLOUDY (A) 01/13/2021 2309   LABSPEC 1.025 01/13/2021 2309   PHURINE 5.0 01/13/2021 2309   GLUCOSEU NEGATIVE 01/13/2021 2309   HGBUR SMALL (A) 01/13/2021 2309   BILIRUBINUR NEGATIVE 01/13/2021 2309   KETONESUR 20 (A) 01/13/2021 2309   PROTEINUR 100 (A) 01/13/2021 2309   NITRITE POSITIVE (A) 01/13/2021 2309   LEUKOCYTESUR MODERATE (A) 01/13/2021 2309   Sepsis Labs Invalid input(s): PROCALCITONIN,  WBC,  LACTICIDVEN Microbiology Recent Results (from the past 240 hour(s))  Resp Panel by RT-PCR (Flu A&B, Covid) Nasopharyngeal Swab     Status: None   Collection Time: 04/21/21  6:12 PM   Specimen: Nasopharyngeal Swab; Nasopharyngeal(NP) swabs in vial transport medium  Result Value Ref Range Status   SARS Coronavirus 2 by RT PCR NEGATIVE NEGATIVE Final    Comment: (NOTE) SARS-CoV-2 target nucleic acids are NOT DETECTED.  The SARS-CoV-2 RNA is generally detectable in upper respiratory specimens during the acute phase of infection. The lowest concentration of SARS-CoV-2 viral copies this assay can detect is 138 copies/mL. A negative result does not preclude SARS-Cov-2 infection and should not be used as the sole basis for treatment or other patient management decisions. A negative result may occur with  improper specimen collection/handling, submission of specimen other than nasopharyngeal swab, presence of viral mutation(s) within the areas targeted by this assay, and inadequate number of viral copies(<138 copies/mL). A negative result must be combined with clinical observations, patient history, and epidemiological information. The expected result is Negative.  Fact Sheet for Patients:  BloggerCourse.com  Fact Sheet for Healthcare Providers:  SeriousBroker.it  This test is no t yet approved  or cleared by the [place] FDA and  has been authorized for detection and/or diagnosis of SARS-CoV-2 by FDA under an Emergency Use Authorization (EUA). This EUA will remain  in effect (meaning this test can be used) for the duration of the COVID-19 declaration under Section 564(b)(1) of the Act, 21 U.S.C.section 360bbb-3(b)(1), unless the authorization is terminated  or revoked sooner.       Influenza A by PCR NEGATIVE NEGATIVE Final   Influenza B by PCR NEGATIVE NEGATIVE Final    Comment: (NOTE) The  Xpert Xpress SARS-CoV-2/FLU/RSV plus assay is intended as an aid in the diagnosis of influenza from Nasopharyngeal swab specimens and should not be used as a sole basis for treatment. Nasal washings and aspirates are unacceptable for Xpert Xpress SARS-CoV-2/FLU/RSV testing.  Fact Sheet for Patients: BloggerCourse.com  Fact Sheet for Healthcare Providers: SeriousBroker.it  This test is not yet approved or cleared by the [place] FDA and has been authorized for detection and/or diagnosis of SARS-CoV-2 by FDA under an Emergency Use Authorization (EUA). This EUA will remain in effect (meaning this test can be used) for the duration of the COVID-19 declaration under Section 564(b)(1) of the Act, 21 U.S.C. section 360bbb-3(b)(1), unless the authorization is terminated or revoked.  Performed at [facility], [address], [place], [place] [address]   MRSA Next Gen by PCR, Nasal     Status: Abnormal   Collection Time: 04/22/21 11:19 AM   Specimen: Nasal Mucosa; Nasal Swab  Result Value Ref Range Status   MRSA by PCR Next Gen DETECTED (A) NOT DETECTED Final    Comment: RESULT CALLED TO, READ BACK BY AND VERIFIED WITH: DOROTHY,H AT 1309 ON 10.26.22 BY RUCINSKI,B (NOTE) The GeneXpert MRSA Assay (FDA approved for NASAL specimens only), is one component of a comprehensive MRSA colonization surveillance program. It is not intended  to diagnose MRSA infection nor to guide or monitor treatment for MRSA infections. Test performance is not FDA approved in patients less than 82 years old. Performed at [facility], [address], [place], [place] [address]      Time coordinating discharge:  SIGNED:   Erick Blinks, MD  [facility] 04/27/2021, 10:56 PM   If 7PM-7AM, please contact night-coverage [email or web address]

## 2021-04-27 NOTE — Progress Notes (Signed)
Pt has discharge orders, discharge teaching given to pt and pts daughter bedside with no questions at this time. Pt wheeled down to short stay main entrance lobby via w/c by staff to vehicle accompanied by daughter.

## 2021-04-28 ENCOUNTER — Other Ambulatory Visit: Payer: Self-pay | Admitting: Internal Medicine

## 2021-04-28 LAB — GLUCOSE, CAPILLARY: Glucose-Capillary: 121 mg/dL — ABNORMAL HIGH (ref 70–99)

## 2021-04-28 MED ORDER — ESOMEPRAZOLE MAGNESIUM 40 MG PO CPDR: 40.0000 mg | DELAYED_RELEASE_CAPSULE | Freq: Every day | ORAL | 1 refills | Status: DC

## 2021-04-28 MED ORDER — TRIAMCINOLONE ACETONIDE 0.1 % EX CREA: 1.0000 "application " | TOPICAL_CREAM | Freq: Two times a day (BID) | CUTANEOUS | 0 refills | Status: DC | PRN

## 2021-04-28 MED ORDER — HYPROMELLOSE (GONIOSCOPIC) 2.5 % OP SOLN: 1.0000 [drp] | Freq: Four times a day (QID) | OPHTHALMIC | 12 refills | Status: AC | PRN

## 2021-05-01 ENCOUNTER — Ambulatory Visit: Payer: Medicare Other | Admitting: Gastroenterology

## 2021-05-05 ENCOUNTER — Other Ambulatory Visit: Payer: Self-pay

## 2021-05-05 ENCOUNTER — Emergency Department (HOSPITAL_COMMUNITY): Payer: Medicare Other

## 2021-05-05 ENCOUNTER — Ambulatory Visit: Payer: Medicare Other | Admitting: General Surgery

## 2021-05-05 ENCOUNTER — Emergency Department (HOSPITAL_COMMUNITY)
Admission: EM | Admit: 2021-05-05 | Discharge: 2021-05-05 | Disposition: A | Discharge status: Home / Self Care | Payer: Medicare Other | Attending: Emergency Medicine | Admitting: Emergency Medicine

## 2021-05-05 ENCOUNTER — Encounter (HOSPITAL_COMMUNITY): Payer: Self-pay | Admitting: Emergency Medicine

## 2021-05-05 DIAGNOSIS — E119 Type 2 diabetes mellitus without complications: Secondary | ICD-10-CM | POA: Diagnosis not present

## 2021-05-05 DIAGNOSIS — Z79899 Other long term (current) drug therapy: Secondary | ICD-10-CM | POA: Insufficient documentation

## 2021-05-05 DIAGNOSIS — Z8616 Personal history of COVID-19: Secondary | ICD-10-CM | POA: Diagnosis not present

## 2021-05-05 DIAGNOSIS — I10 Essential (primary) hypertension: Secondary | ICD-10-CM | POA: Insufficient documentation

## 2021-05-05 DIAGNOSIS — K9423 Gastrostomy malfunction: Secondary | ICD-10-CM | POA: Insufficient documentation

## 2021-05-05 DIAGNOSIS — Z7982 Long term (current) use of aspirin: Secondary | ICD-10-CM | POA: Insufficient documentation

## 2021-05-05 MED ORDER — IOHEXOL 300 MG/ML  SOLN
50.0000 mL | Freq: Once | INTRAMUSCULAR | Status: AC | PRN
  Administered 2021-05-05: 50 mL

## 2021-05-05 NOTE — ED Provider Notes (Signed)
[facility] EMERGENCY DEPARTMENT Provider Note   CSN: [id number] Arrival date & time: 05/05/21  1226     History Chief Complaint  Patient presents with   Abdominal Pain    Feeding tube leaking     Kathryn [name] is a 80 y.o. [name].  HPI Kathryn [name] presents with leakage around her G-tube site.  She previously had a GJ tube but this was converted to a gastrostomy tube only by Dr. Henreitta Leber about a week and a half ago.  This was a smaller size and so she was warned that the hole would be bigger than the tube.  The home health nurse today noticed some drainage around the site and told him to come to the ER.  It seems to be functioning fine.  The patient has chronic abdominal pain that is unchanged and diffuse.  No vomiting.  No fevers.  Past Medical History:  Diagnosis Date   Acid reflux    Diabetes (HCC)    controlled with diet.    HTN (hypertension)     Patient Active Problem List   Diagnosis Date Noted   Jejunostomy malfunction (HCC)    Gastrostomy tube dysfunction (HCC) 04/21/2021   PEG tube malfunction (HCC) 03/02/2021   Protein-calorie malnutrition, severe 03/02/2021   Type 2 diabetes mellitus (HCC) 03/01/2021   Chronic hyponatremia 03/01/2021   Encounter for gastrojejunal (GJ) tube placement 03/01/2021   Positive ANA (antinuclear antibody) 02/23/2021   Hx SBO 02/23/2021   Gastrojejunostomy tube status (HCC) 02/19/2021   Pressure injury of skin 01/28/2021   Constipation    Dislodged gastrostomy tube    Hypoactive bowel sounds    Abdominal pain 01/14/2021   Generalized weakness 01/14/2021   Dehydration 01/14/2021   Normocytic anemia 01/14/2021   Leukocytosis 01/14/2021   Hyperglycemia 01/14/2021   Hypoalbuminemia due to protein-calorie malnutrition (HCC) 01/14/2021   Prolonged QT interval 01/14/2021   Malnutrition of moderate degree 01/14/2021   Achalasia    Nausea and vomiting 01/13/2021   Colitis 12/25/2020   COVID-19 virus infection 12/25/2020    Loss of weight    Dysphagia    Early satiety    UTI (urinary tract infection) 10/23/2020   Elevated d-dimer 10/23/2020   Tachycardia 10/23/2020   Abnormal computed tomography of esophagus 10/23/2020   Mild protein-calorie malnutrition (HCC) 10/23/2020   Rhabdomyolysis 10/23/2020   Hypertension associated with diabetes (HCC) 10/23/2020   Sensorineural hearing loss (SNHL), bilateral 03/02/2016   Family history of colon cancer 04/27/2015   Family history of thyroid cancer 04/27/2015   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   Conjunctivochalasis of both eyes 12/04/2014   Nuclear sclerosis of both eyes 12/04/2014   H/O rotator cuff syndrome 07/12/2012   Rotator cuff tendinitis 07/12/2012   SHOULDER PAIN 10/30/2007    Past Surgical History:  Procedure Laterality Date   ABDOMINAL HYSTERECTOMY     BOTOX INJECTION N/A 01/15/2021   Procedure: BOTOX INJECTION;  Surgeon: Lanelle Bal, DO;  Location: [facility];  Service: Endoscopy;  Laterality: N/A;   BOWEL RESECTION N/A 01/22/2021   Procedure: SMALL BOWEL RESECTION;  Surgeon: Lucretia Roers, MD;  Location: [facility] ORS;  Service: General;  Laterality: N/A;   CHOLECYSTECTOMY     ESOPHAGEAL DILATION N/A 10/24/2020   Procedure: ESOPHAGEAL DILATION;  Surgeon: Corbin Ade, MD;  Location: [facility];  Service: Endoscopy;  Laterality: N/A;   ESOPHAGOGASTRODUODENOSCOPY N/A 10/24/2020   Procedure: ESOPHAGOGASTRODUODENOSCOPY (EGD);  Surgeon: Corbin Ade, MD;  Location: [facility];  Service: Endoscopy;  Laterality: N/A;   ESOPHAGOGASTRODUODENOSCOPY (EGD) WITH PROPOFOL N/A 01/15/2021   Procedure: ESOPHAGOGASTRODUODENOSCOPY (EGD) WITH PROPOFOL;  Surgeon: Eloise Harman, DO;  Location: [facility];  Service: Endoscopy;  Laterality: N/A;   GASTROSTOMY N/A 01/23/2021   Procedure: REINSERTION OF GASTROSTOMY TUBE;  Surgeon: Virl Cagey, MD;  Location: [facility];  Service: General;  Laterality: N/A;   GASTROSTOMY N/A 01/22/2021   Procedure:  INSERTION OF GASTROSTOMY TUBE;  Surgeon: Virl Cagey, MD;  Location: [facility];  Service: General;  Laterality: N/A;   Histosalpingogram     IR GASTR TUBE CONVERT GASTR-JEJ PER W/FL MOD SED  02/02/2021   IR [place] TUBE CHANGE  03/04/2021   IR [place] TUBE CHANGE  03/23/2021   LYSIS OF ADHESION  01/22/2021   Procedure: LYSIS OF ADHESION;  Surgeon: Virl Cagey, MD;  Location: [facility];  Service: General;;     OB History   No obstetric history on file.     Family History  Problem Relation Age of Onset   Colon cancer Father        diagnosed in his late 33s.   COPD Son    Esophageal cancer Neg Hx    Stomach cancer Neg Hx     Social History   Tobacco Use   Smoking status: Never   Smokeless tobacco: Never  Vaping Use   Vaping Use: Never used  Substance Use Topics   Alcohol use: No   Drug use: No    Home Medications Prior to Admission medications   Medication Sig Start Date End Date Taking? Authorizing Provider  acetaminophen (TYLENOL) 160 MG/5ML solution Place 20.3 mLs (650 mg total) into feeding tube every 6 (six) hours as needed for mild pain. 04/26/21  Yes [name], MD  Carboxymethylcellulose Sodium (ARTIFICIAL TEARS OP) Apply 2 drops to eye in the morning, at noon, in the evening, and at bedtime. Dry eye syndrome   Yes [provider]  esomeprazole (NEXIUM) 40 MG capsule Take 1 capsule (40 mg total) by mouth daily at 12 noon. Open capsule and mix contents with free water flush and push through PEG tube. Patient not taking: Reported on 05/05/2021 04/28/21   [name], MD  guaiFENesin (ROBITUSSIN) 100 MG/5ML liquid Place 15 mLs into feeding tube 3 (three) times daily. 04/26/21  Yes [name], MD  hydroxypropyl methylcellulose / hypromellose (ISOPTO TEARS / GONIOVISC) 2.5 % ophthalmic solution Place 1 drop into both eyes 4 (four) times daily as needed for dry eyes. 04/28/21  Yes [name], MD  Nutritional Supplements (FEEDING SUPPLEMENT, OSMOLITE 1.2  CAL,) LIQD Place 1,000 mLs into feeding tube daily. Tube feeding to run at 60ml/hr from 8pm to 8am, increase rate by 24ml/hr every 24 hours until goal of 150ml/hr is reached. 04/27/21  Yes [name], MD  Nutritional Supplements (FEEDING SUPPLEMENT, PROSOURCE TF,) liquid Place 45 mLs into feeding tube daily. 03/08/21  Yes Elodia Florence., MD  triamcinolone cream (KENALOG) 0.1 % Apply 1 application topically 2 (two) times daily as needed (skin irritation). Patient not taking: Reported on 05/05/2021 04/28/21   [name], MD  Water For Irrigation, Sterile (FREE WATER) SOLN Place 280 mLs into feeding tube in the morning, at noon, and at bedtime. 04/27/21  Yes [name], MD  aspirin Kathryn MG chewable tablet Place Kathryn mg into feeding tube daily. Patient not taking: Reported on 05/05/2021    [provider]  HYDROCODONE-ACETAMINOPHEN PO Give 7.5-325 mg by tube  every 6 (six) hours as needed (pain). 10 mls via gtube Patient not taking: Reported on 05/05/2021    [provider]  lansoprazole (PREVACID) 3 mg/ml SUSP oral suspension Place 15 mg into feeding tube daily. Patient not taking: Reported on 05/05/2021    [provider]  triamcinolone ointment (KENALOG) 0.1 % Apply 1 application topically daily as needed (skin irritation). Patient not taking: Reported on 05/05/2021 09/26/20   [provider]    Allergies    Cyclobenzaprine hcl  Review of Systems   Review of Systems  Constitutional:  Negative for fever.  Gastrointestinal:  Positive for abdominal pain. Negative for vomiting.  Skin:  Positive for wound. Negative for color change.   Physical Exam Updated Vital Signs BP 131/62   Pulse 95   Temp 98.6 F (37 C) (Oral)   Resp 19   Ht 5\' 4"  (1.626 m)   Wt 97.6 kg   SpO2 99%   BMI 36.93 kg/m   Physical Exam Vitals and nursing note reviewed.  Constitutional:      Appearance: She is well-developed.  HENT:     Head: Normocephalic and  atraumatic.     Right Ear: External ear normal.     Left Ear: External ear normal.     Nose: Nose normal.  Eyes:     General:        Right eye: No discharge.        Left eye: No discharge.  Cardiovascular:     Rate and Rhythm: Normal rate and regular rhythm.     Heart sounds: Normal heart sounds.  Pulmonary:     Effort: Pulmonary effort is normal.     Breath sounds: Normal breath sounds.  Abdominal:     Palpations: Abdomen is soft.     Tenderness: There is generalized abdominal tenderness (mild).     Comments: G-tube in place. Small amount of drainage noted on gauze. No cellulitis/skin infection.  Skin:    General: Skin is warm and dry.  Neurological:     Mental Status: She is alert.  Psychiatric:        Mood and Affect: Mood is not anxious.    ED Results / Procedures / Treatments   Labs (all labs ordered are listed, but only abnormal results are displayed) Labs Reviewed - No data to display  EKG EKG Interpretation  Date/Time:  Tuesday May 05 2021 13:11:42 EST Ventricular Rate:  97 PR Interval:  121 QRS Duration: 86 QT Interval:  341 QTC Calculation: 434 R Axis:   115 Text Interpretation: Sinus rhythm Probable right ventricular hypertrophy no acute ST/T changes Confirmed by Sherwood Gambler [phone]) on 05/05/2021 1:14:49 PM  Radiology DG ABDOMEN PEG TUBE LOCATION  Result Date: 05/05/2021 CLINICAL DATA:  Peg tube placement EXAM: ABDOMEN - 1 VIEW COMPARISON:  Abdominal x-ray 03/17/2021 FINDINGS: Gastrostomy tube injected with contrast, contrast is seen within the stomach. No extravasation identified. Large amount of retained fecal material noted in the rectum. IMPRESSION: Injected contrast is within the stomach. Electronically Signed   By: Ofilia Neas M.D.   On: 05/05/2021 13:32    Procedures Procedures   Medications Ordered in ED Medications  iohexol (OMNIPAQUE) 300 MG/ML solution 50 mL (50 mLs Per Tube Contrast Given 05/05/21 1323)    ED Course  I have  reviewed the triage vital signs and the nursing notes.  Pertinent labs & imaging results that were available during my care of the patient were reviewed by me and considered in  my medical decision making (see chart for details).    MDM Rules/Calculators/A&P                           Patient has some mild draining but no obvious infection on exam.  She has chronic abdominal pain but this is unchanged according to her and family.  Vital signs are benign.  I discussed with Dr. Constance Haw and she states this is not unexpected given that she had to downsize the tube and recommended family stop manipulating the tube too much to help the wound to close around the tube.  Otherwise she recommends PEG tube x-ray to make sure it is still in the correct spot and it is according to x-ray.  Will discharge home to follow-up with Dr. Constance Haw as needed. Final Clinical Impression(s) / ED Diagnoses Final diagnoses:  Drainage from gastrostomy tube site [facility])    Rx / DC Orders ED Discharge Orders     None        Sherwood Gambler, MD 05/05/21 1429

## 2021-05-05 NOTE — ED Notes (Signed)
Peg tube site clean with mild soap and warm water. Gauze replace. Insertion site free of redness or swelling, mild clear drainage noted

## 2021-05-05 NOTE — ED Triage Notes (Signed)
Pt arrives from home with complaints pain at feeding tube site as well as leaking around insertion site. Tube placed approx 2 weeks ago. Pt denies any bleeding or s/s of infection.

## 2021-05-07 ENCOUNTER — Other Ambulatory Visit: Payer: Self-pay

## 2021-05-07 ENCOUNTER — Ambulatory Visit (INDEPENDENT_AMBULATORY_CARE_PROVIDER_SITE_OTHER): Payer: Medicare Other | Admitting: General Surgery

## 2021-05-07 ENCOUNTER — Encounter: Payer: Self-pay | Admitting: General Surgery

## 2021-05-07 VITALS — BP 99/58 | HR 110 | Temp 97.8°F | Resp 14 | Ht 64.0 in | Wt 215.2 lb

## 2021-05-07 DIAGNOSIS — K9423 Gastrostomy malfunction: Secondary | ICD-10-CM

## 2021-05-07 NOTE — Patient Instructions (Addendum)
Call office to see [name] 6-8 weeks for another appt to check on you.

## 2021-05-08 NOTE — Progress Notes (Signed)
[facility]  Seen in the ED earlier this week for leakage around tube. Daughter with patient. Tolerating G tube feeds. She is feeling better. Still with pain at times but controlled with tylenol. Tube study in ED was good.  Discussed that tube was downsized and manipulation of tube will keep hole from healing up. Discussed not using so much drain sponge material under the tube and taping the tube down to prevent it from being tugged and pulled.  BP (!) 99/58   Pulse (!) 110   Temp 97.8 F (36.6 C) (Other (Comment))   Resp 14   Ht 5\' 4"  (1.626 m)   Wt 215 lb 2.7 oz (97.6 kg)   SpO2 98%   BMI 36.93 kg/m  Minimal leakage, bolster tightened slightly, drain sponges removed, 1 replaced   G tube in place doing well.  Will check on her in January.  Daughter and patient's goal is for her to eat orally again. They see Dr. February and want to ask about botox injections for swallowing. Warned them that she may never eat orally again but that this can be a goal they try to work towards.  Dimple Casey, MD [facility] [address] [place], [place] [phone] (office)

## 2021-06-02 ENCOUNTER — Other Ambulatory Visit (HOSPITAL_COMMUNITY): Payer: Self-pay | Admitting: Specialist

## 2021-06-02 DIAGNOSIS — R633 Feeding difficulties, unspecified: Secondary | ICD-10-CM

## 2021-06-02 DIAGNOSIS — R1319 Other dysphagia: Secondary | ICD-10-CM

## 2021-06-15 ENCOUNTER — Encounter (HOSPITAL_COMMUNITY): Payer: Self-pay | Admitting: Speech Pathology

## 2021-06-15 ENCOUNTER — Ambulatory Visit (HOSPITAL_COMMUNITY)
Admission: RE | Admit: 2021-06-15 | Discharge: 2021-06-15 | Disposition: A | Discharge status: Home / Self Care | Payer: Medicare Other | Source: Ambulatory Visit | Attending: Family Medicine | Admitting: Family Medicine

## 2021-06-15 ENCOUNTER — Ambulatory Visit (HOSPITAL_COMMUNITY): Payer: Medicare Other | Attending: Family Medicine | Admitting: Speech Pathology

## 2021-06-15 ENCOUNTER — Other Ambulatory Visit: Payer: Self-pay

## 2021-06-15 DIAGNOSIS — R633 Feeding difficulties, unspecified: Secondary | ICD-10-CM | POA: Diagnosis present

## 2021-06-15 DIAGNOSIS — R1319 Other dysphagia: Secondary | ICD-10-CM | POA: Insufficient documentation

## 2021-06-15 DIAGNOSIS — R1312 Dysphagia, oropharyngeal phase: Secondary | ICD-10-CM | POA: Insufficient documentation

## 2021-06-15 NOTE — Therapy (Signed)
[facility] [address] [place], [place], [address] Phone: [phone]   Fax:  [phone]  Modified Barium Swallow  Patient Details  Name: Kathryn [name] MRN: [id number] Date of Birth: [birth year] No data recorded  Encounter Date: 06/15/2021   End of Session - 06/15/21 1453     Visit Number 1    Number of Visits 1    Authorization Type [employer] Medicare    SLP Start Time 1143    SLP Stop Time  1245    SLP Time Calculation (min) 62 min    Activity Tolerance Patient tolerated treatment well             Past Medical History:  Diagnosis Date   Acid reflux    Diabetes (HCC)    controlled with diet.    HTN (hypertension)     Past Surgical History:  Procedure Laterality Date   ABDOMINAL HYSTERECTOMY     BOTOX INJECTION N/A 01/15/2021   Procedure: BOTOX INJECTION;  Surgeon: Lanelle Bal, DO;  [facility];  Service: Endoscopy;  Laterality: N/A;   BOWEL RESECTION N/A 01/22/2021   Procedure: SMALL BOWEL RESECTION;  Surgeon: Lucretia Roers, MD;  Location: [facility];  Service: General;  Laterality: N/A;   CHOLECYSTECTOMY     ESOPHAGEAL DILATION N/A 10/24/2020   Procedure: ESOPHAGEAL DILATION;  Surgeon: Corbin Ade, MD;  [facility]: [facility];  Service: Endoscopy;  Laterality: N/A;   ESOPHAGOGASTRODUODENOSCOPY N/A 10/24/2020   Procedure: ESOPHAGOGASTRODUODENOSCOPY (EGD);  Surgeon: Corbin Ade, MD;  [facility];  Service: Endoscopy;  Laterality: N/A;   ESOPHAGOGASTRODUODENOSCOPY (EGD) WITH PROPOFOL N/A 01/15/2021   Procedure: ESOPHAGOGASTRODUODENOSCOPY (EGD) WITH PROPOFOL;  Surgeon: Lanelle Bal, DO;  [facility];  Service: Endoscopy;  Laterality: N/A;   GASTROSTOMY N/A 01/23/2021   Procedure: REINSERTION OF GASTROSTOMY TUBE;  Surgeon: Lucretia Roers, MD;  Location: [facility];  Service: General;  Laterality: N/A;   GASTROSTOMY N/A 01/22/2021   Procedure: INSERTION OF GASTROSTOMY TUBE;   Surgeon: Lucretia Roers, MD;  Location: [facility];  Service: General;  Laterality: N/A;   Histosalpingogram     IR GASTR TUBE CONVERT GASTR-JEJ PER W/FL MOD SED  02/02/2021   IR GJ TUBE CHANGE  03/04/2021   IR GJ TUBE CHANGE  03/23/2021   LYSIS OF ADHESION  01/22/2021   Procedure: LYSIS OF ADHESION;  Surgeon: Lucretia Roers, MD;  Location: [facility];  Service: General;;    There were no vitals filed for this visit.   Subjective Assessment - 06/15/21 1431     Subjective "I have thrush."    Special Tests MBSS    Currently in Pain? No/denies              Manometry from 2014: <<The lower esophageal sphincter pressure is elevated at 75.9 mmHg (normal 10-45 mmHg). The lower esophageal sphincter relaxation pressure is elevated at 25.4 mmHg (normal less than 8 mmHg). There is no esophageal peristalsis. This is achalasia.>>   General - 06/15/21 1433       General Information   Date of Onset 01/14/21    HPI Kathryn [name] is an 80 yo female who was referred by Dr. Sundra Aland for MBSS. EPIC chart review revealed that Kathryn [name] was diagnosed with achalasia in 2014 via manometry by Dr. Quinn Axe. She underwent Heller myotomy with fundoplication in December 2014 with Dr. Lonzo Candy. She had EGD 10/24/20 with dilation with Dr. Jena Gauss,  Barium swallow 01/14/21, EGD with Botox into the LES with Dr. Abbey Chatters 01/15/21 (fluid found in esophagus and in stomach), xray on 01/20/21 showed retained barium in the stomach from barium swallow 5 days prior, PEG placed 01/22/21, and evaluation with Dr. Ignacia Marvel with rheumotology. Kathryn [name] with positive ANA and SSA antibodies. Doctors have questioned possible Scleroderma, Sjogren's, SLE (Lupus), and gastroparesis. She has not eaten by mouth since July 2022. She reports thich, ropy secretions orally.    Type of Study MBS-Modified Barium Swallow Study    Diet Prior to this Study NPO;PEG tube    Temperature Spikes Noted No    Respiratory Status Room air    History of Recent Intubation  No    Behavior/Cognition Alert;Cooperative;Pleasant mood    Oral Cavity Assessment Other (comment)   significant white lingual coating   Oral Care Completed by SLP No    Oral Cavity - Dentition Missing dentition   Kathryn [name] has some lower dentition, dentures do not fit due to weight loss per Kathryn [name].   Vision Functional for self feeding    Self-Feeding Abilities Able to feed self    Patient Positioning Upright in chair    Baseline Vocal Quality Normal    Volitional Cough Strong    Volitional Swallow Able to elicit    Anatomy Within functional limits    Pharyngeal Secretions Not observed secondary MBS                Oral Preparation/Oral Phase - 06/15/21 1444       Oral Preparation/Oral Phase   Oral Phase Impaired      Oral - Thin   Oral - Thin Teaspoon Within functional limits    Oral - Thin Cup Within functional limits    Oral - Thin Straw Within functional limits      Oral - Solids   Oral - Puree Weak ligual manipulation;Delayed A-P transit;Piecemeal swallowing;Oral residue    Oral - Regular Imparied mastication;Decreased bolus cohesion;Delayed A-P transit;Oral residue;Piecemeal swallowing      Electrical stimulation - Oral Phase   Was Electrical Stimulation Used No              Pharyngeal Phase - 06/15/21 1446       Pharyngeal Phase   Pharyngeal Phase Impaired      Pharyngeal - Thin   Pharyngeal- Thin Teaspoon Swallow initiation at vallecula;Reduced pharyngeal peristalsis;Reduced epiglottic inversion;Reduced tongue base retraction;Pharyngeal residue - valleculae;Pharyngeal residue - pyriform    Pharyngeal- Thin Cup Swallow initiation at vallecula;Reduced epiglottic inversion;Reduced pharyngeal peristalsis;Reduced tongue base retraction;Pharyngeal residue - valleculae;Pharyngeal residue - pyriform    Pharyngeal- Thin Straw Swallow initiation at vallecula;Reduced epiglottic inversion;Reduced pharyngeal peristalsis;Reduced tongue base retraction;Pharyngeal residue -  valleculae;Pharyngeal residue - pyriform;Penetration/Apiration after swallow    Pharyngeal Material enters airway, remains ABOVE vocal cords then ejected out      Pharyngeal - Solids   Pharyngeal- Puree Swallow initiation at vallecula;Swallow initiation at pyriform sinus;Reduced pharyngeal peristalsis;Reduced epiglottic inversion;Reduced tongue base retraction;Pharyngeal residue - pyriform;Pharyngeal residue - posterior pharnyx;Pharyngeal residue - cp segment;Lateral channel residue    Pharyngeal- Regular Delayed swallow initiation-vallecula;Reduced tongue base retraction;Pharyngeal residue - pyriform      Electrical Stimulation - Pharyngeal Phase   Was Electrical Stimulation Used No              Cricopharyngeal Phase - 06/15/21 1450       Cervical Esophageal Phase   Cervical Esophageal Phase Impaired      Cervical Esophageal Phase - Thin   Thin  Cup Reduced cricopharyngeal relaxation;Prominent cricopharyngeal segment      Cervical Esophageal Phase - Comment   Other Esophageal Phase Observations esophagus never completely cleared               Plan - 06/15/21 1454     Clinical Impression Statement Kathryn [name] presents with moderate oropharyngeal dysphagia in setting of suspected primary esophageal dysphagia. Kathryn [name] has not been eating or drinking by mouth since July 2022 and may have an element of muscle disuse atrophy. Oral motor examination reveals reduced jaw ROM and min lingual abnormality on the right anterior portion (Kathryn [name] reports baseline for as Joe as she rembers), white lingual coating suspicious for thrush, and excessive oral secretions. Oral phase is marked by reduced lingual movement resulting in reduced bolus cohesiveness, piecemeal deglutition, and lingual residuals. Pharyngeal phase is marked by swallow trigger at the level of the valleculae, reduced tongue base retraction, epiglottic deflection, and reduced posterior pharyngeal squeeze resulting in vallecular, lateral channel,  and pyriform residue with larger cup sips and puree/regular. Kathryn [name] has bony prominences along C-spine and also a prominent cricopharyngeus. Kathryn [name] with only trace penetration of thins which occurred after the swallow from residuals and no aspiration observed. Esophageal sweep revealed barium filled esophagus. Suspect Kathryn [name]'s dysphagia is multifactorial in nature given previous diagnosis of achalasia and recent question of gastroparesis. Head turns were ineffective in reducing pharyngeal residuals. Kathryn [name]'s epiglottis did not deflect on dry swallows, only with intial bolus presentations. Given h/o achalasia, recommend that Kathryn [name] follow back up with GI (either at [facility]- saw Abbey Chatters most recently for EGD with botox or with Dr. Carney Corners who did last Heller myotomy in 2014). Recommend initiating sips of water and ice chips throughout the day and progress to other liquids until seen by GI. Kathryn [name] will likely need treatment for suspected oral thrush and continue with oral care.    Treatment/Interventions Patient/family education    Potential to Achieve Goals Fair    Consulted and Agree with Plan of Care Patient;Family member/caregiver             Patient will benefit from skilled therapeutic intervention in order to improve the following deficits and impairments:   Dysphagia, oropharyngeal phase     Recommendations/Treatment - 06/15/21 1451       Swallow Evaluation Recommendations   Recommended Consults Consider GI evaluation    SLP Diet Recommendations Thin    Liquid Administration via Cup    Medication Administration Via alternative means    Supervision Patient able to self feed    Compensations Small sips/bites;Multiple dry swallows after each bite/sip    Postural Changes Seated upright at 90 degrees;Remain upright for at least 30 minutes after feeds/meals              Prognosis - 06/15/21 1452       Prognosis   Prognosis for Safe Diet Advancement Fair    Barriers to Reach Goals Time post onset     Barriers/Prognosis Comment Suspect multiple factors are playing a part      Individuals Consulted   Consulted and Agree with Results and Recommendations Patient;Family member/caregiver    Family Member Consulted daughter    Report Sent to  Referring physician             Problem List Patient Active Problem List   Diagnosis Date Noted   Jejunostomy malfunction ([place])    Gastrostomy tube dysfunction ([place]) 04/21/2021   PEG tube malfunction ([place]) 03/02/2021   Protein-calorie malnutrition, severe 03/02/2021  Type 2 diabetes mellitus ([place]) 03/01/2021   Chronic hyponatremia 03/01/2021   Encounter for gastrojejunal (GJ) tube placement 03/01/2021   Positive ANA (antinuclear antibody) 02/23/2021   Hx SBO 02/23/2021   Gastrojejunostomy tube status ([place]) 02/19/2021   Pressure injury of skin 01/28/2021   Constipation    Dislodged gastrostomy tube    Hypoactive bowel sounds    Abdominal pain 01/14/2021   Generalized weakness 01/14/2021   Dehydration 01/14/2021   Normocytic anemia 01/14/2021   Leukocytosis 01/14/2021   Hyperglycemia 01/14/2021   Hypoalbuminemia due to protein-calorie malnutrition (HCC) 01/14/2021   Prolonged QT interval 01/14/2021   Malnutrition of moderate degree 01/14/2021   Achalasia    Nausea and vomiting 01/13/2021   Colitis 12/25/2020   COVID-19 virus infection 12/25/2020   Loss of weight    Dysphagia    Early satiety    UTI (urinary tract infection) 10/23/2020   Elevated d-dimer 10/23/2020   Tachycardia 10/23/2020   Abnormal computed tomography of esophagus 10/23/2020   Mild protein-calorie malnutrition ([place]) 10/23/2020   Rhabdomyolysis 10/23/2020   Hypertension associated with diabetes ([place]) 10/23/2020   Sensorineural hearing loss (SNHL), bilateral 03/02/2016   Family history of colon cancer 04/27/2015   Family history of thyroid cancer 04/27/2015   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   Conjunctivochalasis of both eyes 12/04/2014   Nuclear sclerosis  of both eyes 12/04/2014   H/O rotator cuff syndrome 07/12/2012   Rotator cuff tendinitis 07/12/2012   SHOULDER PAIN 10/30/2007   Thank you,  Genene Churn, [place], [place] 06/15/2021, 3:10 PM  [place], [place], [address] Phone: [phone]   Fax:  [phone]  Name: Kathryn [name] MRN: [id number] Date of Birth: Jan 12, 1941

## 2021-06-25 ENCOUNTER — Telehealth: Payer: Self-pay | Admitting: *Deleted

## 2021-06-25 ENCOUNTER — Ambulatory Visit (INDEPENDENT_AMBULATORY_CARE_PROVIDER_SITE_OTHER): Payer: Medicare Other | Admitting: General Surgery

## 2021-06-25 ENCOUNTER — Encounter: Payer: Self-pay | Admitting: General Surgery

## 2021-06-25 ENCOUNTER — Other Ambulatory Visit: Payer: Self-pay

## 2021-06-25 VITALS — BP 106/72 | HR 103 | Temp 98.6°F | Resp 18

## 2021-06-25 DIAGNOSIS — K047 Periapical abscess without sinus: Secondary | ICD-10-CM | POA: Diagnosis not present

## 2021-06-25 DIAGNOSIS — T85848A Pain due to other internal prosthetic devices, implants and grafts, initial encounter: Secondary | ICD-10-CM | POA: Diagnosis not present

## 2021-06-25 MED ORDER — AMOXICILLIN-POT CLAVULANATE 875-125 MG PO TABS: 1.0000 | ORAL_TABLET | Freq: Two times a day (BID) | ORAL | 0 refills | Status: DC

## 2021-06-25 MED ORDER — AMOXICILLIN-POT CLAVULANATE 600-42.9 MG/5ML PO SUSR: 600.0000 mg | Freq: Two times a day (BID) | ORAL | 0 refills | Status: AC

## 2021-06-25 NOTE — Telephone Encounter (Signed)
Received call from Beryle Flock, [facility] with [facility] [phone] telephone.   Reports that patient completed modified barium swallow study and was noted able to tolerate clear thin liquids.   Requested Dr Henreitta Leber to evaluate and to determine if diet should be upgraded, or if patient should continue with PEG tube and thin liquids for pleasure.  Please advise.

## 2021-06-25 NOTE — Patient Instructions (Signed)
Petroleum gel /A&D around the tube and small dressing is fine.  Try to not manipulate peg unless needed. Go see dentist for tooth, augmentin prescribed until can see dentist. Need to see asap.

## 2021-06-26 NOTE — Progress Notes (Signed)
[facility]  Patient had swallow study and can take liquids. She also having some pain in her teeth and a swollen lip, they are afraid that she has an infected tooth.   He Gtube site is hurting. The tube has been leaking around it.   BP 106/72    Pulse (!) 103    Temp 98.6 F (37 C) (Oral)    Resp 18    SpO2 95%  G tube bumper was pulled out some, redness around the tube from acidic juices from stomach Lower lip swollen and tender, necrotic appearing tooth on the lower gum  Secured ostomy bumper back down to skin.  G tube flushed well.  Patient with G tube in place and issues with swallowing, reported to have passed a swallow study and can do liquids.   Petroleum gel /A&D around the tube and small dressing is fine.  Try to not manipulate peg unless needed. Go see dentist for tooth, augmentin prescribed until can see dentist. Sent over to Dr. Tomasita Crumble, DDS to see about tooth.    Future Appointments  Date Time Provider Department Center  07/02/2021  1:45 PM Lucretia Roers, MD [facility] None  07/13/2021  2:00 PM Rice, Jamesetta [place], MD [facility] None    Algis Greenhouse, MD [facility] [address] [place], [place] [address] [phone] (office)

## 2021-06-30 NOTE — Telephone Encounter (Signed)
Per Dr. Constance Haw, patient can advance diet as tolerated and directed by MBSS. Unfortunately, we do not think she will be able to receive enough nutrition from PO intake, so tube feedings will remain.   Call placed to [place] to make aware and left directions on VM.

## 2021-06-30 NOTE — Telephone Encounter (Signed)
Received call from Beryle Flock, [facility] [phone] telephone. Inquired if patient can upgrade diet to pureed. Please advise.    Also received VM from patient daughter, Rannie [phone]~ telephone. Reports that patient tube is clogged and inquired if ABTx can be given PO. Call placed to patient daughter to discuss. No answer. No VM.

## 2021-07-02 ENCOUNTER — Encounter: Payer: Self-pay | Admitting: General Surgery

## 2021-07-02 ENCOUNTER — Ambulatory Visit (INDEPENDENT_AMBULATORY_CARE_PROVIDER_SITE_OTHER): Payer: Medicare Other | Admitting: General Surgery

## 2021-07-02 ENCOUNTER — Other Ambulatory Visit: Payer: Self-pay

## 2021-07-02 VITALS — BP 116/71 | HR 103 | Temp 99.0°F | Resp 16 | Ht 64.0 in | Wt 105.6 lb

## 2021-07-02 DIAGNOSIS — K9423 Gastrostomy malfunction: Secondary | ICD-10-CM | POA: Diagnosis not present

## 2021-07-02 DIAGNOSIS — K047 Periapical abscess without sinus: Secondary | ICD-10-CM | POA: Diagnosis not present

## 2021-07-02 NOTE — Patient Instructions (Signed)
Call with issues with the g tube. Ok to do thin liquids.

## 2021-07-03 NOTE — Progress Notes (Signed)
[facility]  Doing well. G tube is leaking less. She is doing some thin liquids. Tolerating her feeds.  She sees rheumatology again in a few weeks. Speech evaluation had said she could tolerate thin liquids. I do not think she will get her nutritional needs met but can do this for comfort.  BP 116/71    Pulse (!) 103    Temp 99 F (37.2 C) (Oral)    Resp 16    Ht 5' 4"  (1.626 m)    Wt 105 lb 9.6 oz (47.9 kg)    SpO2 97%    BMI 18.13 kg/m  G tube site with less irritation of the skin, gauze in place   Patient s/p G tube in place with some leakage issues. She has esophageal motility issues with a previous diagnosis of achalasia with some degree of motility issue from rheumatologic issue +/- gastroparesis.  Continue G tube feeds Thin liquids as tolerated Call if issues  Future Appointments  Date Time Provider [place]  07/13/2021  2:00 PM Rice, Resa Miner, MD [facility] None    Curlene Labrum, MD [facility] [address] [place], [place] [id number] [phone] (office)

## 2021-07-13 ENCOUNTER — Encounter: Payer: Self-pay | Admitting: Internal Medicine

## 2021-07-13 ENCOUNTER — Other Ambulatory Visit: Payer: Self-pay

## 2021-07-13 ENCOUNTER — Ambulatory Visit (INDEPENDENT_AMBULATORY_CARE_PROVIDER_SITE_OTHER): Payer: Medicare Other | Admitting: Internal Medicine

## 2021-07-13 VITALS — BP 152/80 | HR 116 | Resp 17 | Ht 64.0 in | Wt 106.0 lb

## 2021-07-13 DIAGNOSIS — M25512 Pain in left shoulder: Secondary | ICD-10-CM | POA: Diagnosis not present

## 2021-07-13 DIAGNOSIS — R768 Other specified abnormal immunological findings in serum: Secondary | ICD-10-CM | POA: Diagnosis not present

## 2021-07-13 DIAGNOSIS — M6282 Rhabdomyolysis: Secondary | ICD-10-CM

## 2021-07-13 DIAGNOSIS — K220 Achalasia of cardia: Secondary | ICD-10-CM

## 2021-07-13 MED ORDER — METHYLPREDNISOLONE ACETATE 40 MG/ML IJ SUSP
40.0000 mg | Freq: Once | INTRAMUSCULAR | Status: AC
Start: 2021-07-13 — End: ?

## 2021-07-13 NOTE — Progress Notes (Signed)
Office Visit Note  Patient: Kathryn [name]             Date of Birth: 03-Feb-1941           MRN: [id number]             PCP: Leeanne Rio, MD Referring: Leeanne Rio, MD Visit Date: 07/13/2021   Subjective:  Pain of the Left Shoulder   History of Present Illness: Kathryn [name] is a 81 y.o. female here for follow up for arthralgias and weakness possible myositis. IM steroid injection at previous visit with a very good initial improvement in mobility and strength, had been trouble with tablet medication previously due to obstructed J tube this is improved with switching to a G-tube though continues having a lot of oral secretion problems with the esophageal dysmotility.  She has made some additional improvement in strength and mobility working with physical therapy although still far below her baseline before problem started last year.  A lot of stiffness and relative weakness affecting the shoulders and hips bilaterally without much pain.  Previous HPI 03/16/21 Kathryn [name] is a 81 y.o. female here for follow up for weakness, joint pains, and positive autoantibody serology. Lab workup at that time demonstrated further increase in ESR and myositis panel EJ Abs positive. Since our last visit she had to return to the hospital for 1 week due to repeat obstruction and dislodged J tube. She is working with physical therapy with some improvement in her symptoms although remains weak. Today she has increased coughing and secretions she attributes to being without her usual oral suction at facility.    Kathryn [name] daughter is present with her today assisting with mobility and collateral history.   Previous HPI 02/23/21 Kathryn [name] is a 81 y.o. female here for evaluation of joint pains and weakness with concern for possible PMR also for nontraumatic rhabdomyolysis.  Her medical history significant for esophageal dysphagia with previous stricture formation, type 2 diabetes, and chronic  anemia with some a plasma cell dyscrasia but no prior diagnosis of chronic inflammatory disease. Her recent medical course has been complicated by dehydration and limited nutrition with reliance on gastrostomy tube.  No specific inciting cause was identified for the initial hospitalization and weakness with CK elevation since the original hospitalization, although subsequently was readmitted with complications particularly with repeated obstruction of gastrostomy tubing interfering with medication and nutrition.  She is experienced 29 pound total weight loss since documented weight at hospital admission in April of this year.  She continues to feel overall weak symptoms are actually partially improved compared to a few months ago but still much worse than her previous baseline before this entire process started about 5 months ago.  Initial onset was bilateral with involvement of upper and lower extremities first came to additional attention from her decreased stability and speed with walking and then became completely unable to transfer unaided.  Joint pain initially in shoulders and hips leading to initial concern of PMR.  She has had previous shoulder pains related to rotator cuff arthropathy on the right and possibly left but these have not usually limit her activity to this extent. She received right shoulder steroid injection in May with orthopedic surgery clinic which helped somewhat locally.   Labs reviewed 09/2020 ANA pos SSA > 8.0 dsDNA, RNP, SM, Scl-70, SSB, chromatin, Jo-1, centromere neg RF neg ESR 44 CK 1,206     Review of Systems  Constitutional:  Positive  for fatigue.  HENT:  Positive for mouth dryness.   Eyes:  Positive for dryness.  Respiratory:  Negative for shortness of breath.   Cardiovascular:  Negative for swelling in legs/feet.  Gastrointestinal:  Negative for constipation.  Endocrine: Negative for excessive thirst.  Genitourinary:  Negative for difficulty urinating.   Musculoskeletal:  Positive for joint pain, gait problem, joint pain, muscle weakness, morning stiffness and muscle tenderness.  Skin:  Negative for rash.  Allergic/Immunologic: Negative for susceptible to infections.  Neurological:  Negative for numbness.  Hematological:  Negative for bruising/bleeding tendency.  Psychiatric/Behavioral:  Negative for sleep disturbance.    PMFS History:  Patient Active Problem List   Diagnosis Date Noted   Pain around PEG tube site 06/25/2021   Infected tooth 06/25/2021   Jejunostomy malfunction ([place])    Gastrostomy tube dysfunction ([place]) 04/21/2021   PEG tube malfunction ([place]) 03/02/2021   Protein-calorie malnutrition, severe 03/02/2021   Type 2 diabetes mellitus ([place]) 03/01/2021   Chronic hyponatremia 03/01/2021   Encounter for gastrojejunal (GJ) tube placement 03/01/2021   Positive ANA (antinuclear antibody) 02/23/2021   Hx SBO 02/23/2021   Gastrojejunostomy tube status ([place]) 02/19/2021   Pressure injury of skin 01/28/2021   Constipation    Dislodged gastrostomy tube    Hypoactive bowel sounds    Abdominal pain 01/14/2021   Generalized weakness 01/14/2021   Dehydration 01/14/2021   Normocytic anemia 01/14/2021   Leukocytosis 01/14/2021   Hyperglycemia 01/14/2021   Hypoalbuminemia due to protein-calorie malnutrition (HCC) 01/14/2021   Prolonged QT interval 01/14/2021   Malnutrition of moderate degree 01/14/2021   Achalasia    Nausea and vomiting 01/13/2021   Colitis 12/25/2020   COVID-19 virus infection 12/25/2020   Loss of weight    Dysphagia    Early satiety    UTI (urinary tract infection) 10/23/2020   Elevated d-dimer 10/23/2020   Tachycardia 10/23/2020   Abnormal computed tomography of esophagus 10/23/2020   Mild protein-calorie malnutrition ([place]) 10/23/2020   Rhabdomyolysis 10/23/2020   Hypertension associated with diabetes ([place]) 10/23/2020   Sensorineural hearing loss (SNHL), bilateral 03/02/2016   Family history of colon  cancer 04/27/2015   Family history of thyroid cancer 04/27/2015   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   Conjunctivochalasis of both eyes 12/04/2014   Nuclear sclerosis of both eyes 12/04/2014   H/O rotator cuff syndrome 07/12/2012   Rotator cuff tendinitis 07/12/2012   SHOULDER PAIN 10/30/2007    Past Medical History:  Diagnosis Date   Acid reflux    Diabetes ([place])    controlled with diet.    HTN (hypertension)     Family History  Problem Relation Age of Onset   Colon cancer Father        diagnosed in his late 22s.   COPD Son    Esophageal cancer Neg Hx    Stomach cancer Neg Hx    Past Surgical History:  Procedure Laterality Date   ABDOMINAL HYSTERECTOMY     BOTOX INJECTION N/A 01/15/2021   Procedure: BOTOX INJECTION;  Surgeon: Eloise Harman, DO;  [facility];  Service: Endoscopy;  Laterality: N/A;   BOWEL RESECTION N/A 01/22/2021   Procedure: SMALL BOWEL RESECTION;  Surgeon: Virl Cagey, MD;  Location: [facility];  Service: General;  Laterality: N/A;   CHOLECYSTECTOMY     ESOPHAGEAL DILATION N/A 10/24/2020   Procedure: ESOPHAGEAL DILATION;  Surgeon: Daneil Dolin, MD;  [facility];  Service: Endoscopy;  Laterality: N/A;   ESOPHAGOGASTRODUODENOSCOPY N/A 10/24/2020  Procedure: ESOPHAGOGASTRODUODENOSCOPY (EGD);  Surgeon: Daneil Dolin, MD;  [facility];  Service: Endoscopy;  Laterality: N/A;   ESOPHAGOGASTRODUODENOSCOPY (EGD) WITH PROPOFOL N/A 01/15/2021   Procedure: ESOPHAGOGASTRODUODENOSCOPY (EGD) WITH PROPOFOL;  Surgeon: Eloise Harman, DO;  [facility];  Service: Endoscopy;  Laterality: N/A;   GASTROSTOMY N/A 01/23/2021   Procedure: REINSERTION OF GASTROSTOMY TUBE;  Surgeon: Virl Cagey, MD;  Location: [facility];  Service: General;  Laterality: N/A;   GASTROSTOMY N/A 01/22/2021   Procedure: INSERTION OF GASTROSTOMY TUBE;  Surgeon: Virl Cagey, MD;  Location: [facility];  Service: General;  Laterality: N/A;    Histosalpingogram     IR GASTR TUBE CONVERT GASTR-JEJ PER W/FL MOD SED  02/02/2021   IR [place] TUBE CHANGE  03/04/2021   IR [place] TUBE CHANGE  03/23/2021   LYSIS OF ADHESION  01/22/2021   Procedure: LYSIS OF ADHESION;  Surgeon: Virl Cagey, MD;  Location: [facility];  Service: General;;   Social History   Social History Narrative   Not on file   Immunization History  Administered Date(s) Administered   Influenza Inj Mdck Quad Pf 05/26/2020   Influenza,inj,Quad PF,6+ Mos 04/19/2017, 04/12/2018, 05/31/2019   Influenza-Unspecified 04/07/2015   Pneumococcal Conjugate-13 04/01/2014   Pneumococcal Polysaccharide-23 06/13/2006   Tetanus 07/25/1996     Objective: Vital Signs: BP (!) 152/80 (BP Location: Right Arm, Patient Position: Sitting, Cuff Size: Normal)    Pulse (!) 116    Resp 17    Ht _0  (1.626 m)    Wt 106 lb (48.1 kg)    BMI 18.19 kg/m    Physical Exam Constitutional:      Comments: Chronically ill appearing underweight woman in wheelchair  Cardiovascular:     Rate and Rhythm: Normal rate.  Pulmonary:     Effort: Pulmonary effort is normal.     Breath sounds: Normal breath sounds.  Musculoskeletal:     Right lower leg: No edema.     Left lower leg: No edema.  Skin:    General: Skin is warm and dry.  Neurological:     Mental Status: She is alert.     Musculoskeletal Exam:  Shoulders unable to fully raise overhead without assistance, passive ROM also restricted worse on left side Heberdon's nodes present on fingers of both hands Knees full ROM no tenderness or swelling, patellofemoral crepitus present   Investigation: No additional findings.  Imaging: No results found.  Recent Labs: Lab Results  Component Value Date   WBC 4.4 04/22/2021   HGB 9.9 (L) 04/22/2021   PLT 275 04/22/2021   NA 132 (L) 04/22/2021   K 3.6 04/22/2021   CL 101 04/22/2021   CO2 25 04/22/2021   GLUCOSE 155 (H) 04/22/2021   BUN 18 04/22/2021   CREATININE 0.45 04/22/2021   BILITOT 0.5  04/22/2021   ALKPHOS 74 04/22/2021   AST 29 04/22/2021   ALT 20 04/22/2021   PROT 6.5 04/22/2021   ALBUMIN 2.7 (L) 04/22/2021   CALCIUM 8.9 04/22/2021    Speciality Comments: No specialty comments available.  Procedures:  No procedures performed Allergies: Cyclobenzaprine hcl   Assessment / Plan:     Visit Diagnoses: Positive ANA (antinuclear antibody)  Non-traumatic rhabdomyolysis - Plan: CK, Aldolase, Sedimentation rate, methylPREDNISolone acetate (DEPO-MEDROL) injection 40 mg  Abnormal labs with positive ANA and EJ Abs moderately high CK last year at initial onset. She is off any steroids today, Will recheck labs including CK, aldolase, ESR for  any evidence of ongoing disease. Unfortunately her deconditioning and loss of muscle bulk is limiting activity. For short term treatment IM depo-medrol 40 mg once today in clinic.  Achalasia  Still not clearing secretions well this is major health and quality of life limitation but no new plan recommended. May be related to underlying inflammation or myopathy process but not clear and seems to be a stable persistent problem now.  Pain in joint of left shoulder  Previous fracture and AVN significant structural problems causing pain here no obvious swelling seen. Steroid injection hopefully beneficial in short term and recommended trying to stretch and keep using this as tolerated.  Orders: Orders Placed This Encounter  Procedures   CK   Aldolase   Sedimentation rate   Meds ordered this encounter  Medications   methylPREDNISolone acetate (DEPO-MEDROL) injection 40 mg     Follow-Up Instructions: No follow-ups on file.   Collier Salina, MD  Note - This record has been created using [employer].  Chart creation errors have been sought, but may not always  have been located. Such creation errors do not reflect on  the standard of medical care.

## 2021-07-14 LAB — ALDOLASE: Aldolase: 3.5 U/L (ref <=8.1)

## 2021-07-14 LAB — SEDIMENTATION RATE: Sed Rate: 19 mm/h (ref 0–30)

## 2021-07-14 LAB — CK: Total CK: 57 U/L (ref 29–143)

## 2021-07-17 NOTE — Progress Notes (Signed)
Lab results show totally normal muscle enzyme markers now. Based on this I do not recommend trying to start any new maintenance or [place] term medication right now.

## 2021-08-11 ENCOUNTER — Ambulatory Visit: Payer: Medicare Other | Admitting: General Surgery

## 2021-08-25 ENCOUNTER — Encounter: Payer: Self-pay | Admitting: General Surgery

## 2021-08-25 ENCOUNTER — Ambulatory Visit: Payer: Medicare Other | Admitting: General Surgery

## 2021-08-25 ENCOUNTER — Other Ambulatory Visit: Payer: Self-pay

## 2021-08-25 VITALS — BP 103/67 | HR 111 | Temp 97.6°F | Resp 14 | Ht 64.0 in | Wt 106.0 lb

## 2021-08-25 DIAGNOSIS — L929 Granulomatous disorder of the skin and subcutaneous tissue, unspecified: Secondary | ICD-10-CM

## 2021-08-25 MED ORDER — ACETAMINOPHEN 160 MG/5ML PO ELIX: 500.0000 mg | ORAL_SOLUTION | ORAL | 1 refills | Status: DC | PRN

## 2021-08-25 MED ORDER — PANTOPRAZOLE SODIUM 40 MG PO TBEC: 40.0000 mg | DELAYED_RELEASE_TABLET | Freq: Every day | ORAL | 1 refills | Status: AC

## 2021-08-25 NOTE — Patient Instructions (Signed)
Zinc oxide to the skin around the tube daily and as needed. Keep this skin barrier on. Can still do a gauze dressing as needed. Limit movement and pulling on the tube. Do not dilute the feeds.  Take protonix, crush the tablet and put through the tube daily.  Will talk to [facility] about the other medication.  Will see in 2 weeks.

## 2021-08-25 NOTE — Progress Notes (Signed)
[facility]  Leaking around the tube. Photo looks bilious. Has been otherwise well. Has burning pain at the site. No other abdominal pain. No pain with TF. Is doing some coffee per mouth. Is not taking any omeprazole or other meds.   BP 103/67    Pulse (!) 111    Temp 97.6 F (36.4 C) (Oral)    Resp 14    Ht 5\' 4"  (1.626 m)    Wt 106 lb (48.1 kg)    SpO2 94%    BMI 18.19 kg/m  Gastrostomy tube site with granulation tissue, silver nitrate applied  Skin around G tube is irritated from bile drainage Bumper adjusted down and gauze replaced   Patient with gastrostomy tube in place. Has motility issues and achalasia. Is tolerating feeds but having bile drainage from the tube site. Granulation has been silver nitrated today and bumper adjusted. This should help.   Adding protonix 40 mg tablet daily, crushed via tube to help with acid  Zinc oxide to the skin around the tube daily and as needed. Keep this skin barrier on. Can still do a gauze dressing as needed. Limit movement and pulling on the tube. Do not dilute the feeds.  Will talk to [facility] about starting reglan.   Will see in 2 weeks.   Future Appointments  Date Time Provider Department Center  09/09/2021  3:15 PM 09/11/2021, MD [facility] None   Lucretia Roers, MD [facility] [address] [place], [place] [phone] (office)

## 2021-08-31 ENCOUNTER — Emergency Department (HOSPITAL_COMMUNITY): Payer: Medicare Other

## 2021-08-31 ENCOUNTER — Encounter (HOSPITAL_COMMUNITY): Payer: Self-pay

## 2021-08-31 ENCOUNTER — Other Ambulatory Visit: Payer: Self-pay

## 2021-08-31 ENCOUNTER — Emergency Department (HOSPITAL_COMMUNITY)
Admission: EM | Admit: 2021-08-31 | Discharge: 2021-09-01 | Disposition: A | Discharge status: Home / Self Care | Payer: Medicare Other | Attending: Emergency Medicine | Admitting: Emergency Medicine

## 2021-08-31 DIAGNOSIS — Z931 Gastrostomy status: Secondary | ICD-10-CM

## 2021-08-31 DIAGNOSIS — K9423 Gastrostomy malfunction: Secondary | ICD-10-CM | POA: Diagnosis not present

## 2021-08-31 MED ORDER — IOHEXOL 300 MG/ML  SOLN: 50.0000 mL | Freq: Once | INTRAMUSCULAR | Status: DC | PRN

## 2021-08-31 NOTE — Discharge Instructions (Signed)
Your gastric tube has been replaced, and appears appropriately located. ? ?Follow-up with your surgeon or primary care doctor as planned. ?

## 2021-08-31 NOTE — ED Provider Notes (Signed)
?[facility] EMERGENCY DEPARTMENT ?Provider Note ? ? ?CSN: [id number] ?Arrival date & time: 08/31/21  1809 ? ?  ? ?History ? ?No chief complaint on file. ? ? ?Kathryn [name] is a 81 y.o. female. ? ?HPI ? ?  ? ?81 year old female comes in with chief complaint of dislodged feeding tube. ?Patient indicates that she heard a pop, and then the G-tube came out a little bit.  Family noted that the balloon had popped. ? ?Patient has no complaints, specifically no nausea, vomiting, fevers, chills, abdominal pain.   ? ?Home Medications ?Prior to Admission medications   ?Medication Sig Start Date End Date Taking? Authorizing Provider  ?acetaminophen (TYLENOL) 160 MG/5ML elixir Take 15.6 mLs (500 mg total) by mouth every 4 (four) hours as needed for fever or pain. 08/25/21  Yes Lucretia Roers, MD  ?diphenhydrAMINE (BENADRYL) 12.5 MG/5ML elixir Take 12.5 mg by mouth daily as needed.   Yes [provider]  ?hydroxypropyl methylcellulose / hypromellose (ISOPTO TEARS / GONIOVISC) 2.5 % ophthalmic solution Place 1 drop into both eyes 4 (four) times daily as needed for dry eyes. 04/28/21  Yes Erick Blinks, MD  ?Nutritional Supplements (FEEDING SUPPLEMENT, OSMOLITE 1.2 CAL,) LIQD Place 1,000 mLs into feeding tube daily. Tube feeding to run at 43ml/hr from 8pm to 8am, increase rate by 57ml/hr every 24 hours until goal of 141ml/hr is reached. ?Patient taking differently: Place 1,000 mLs into feeding tube daily. Tube feeding to run at 1031ml/hr from 8pm to 8am 04/27/21  Yes Memon, Durward Mallard, MD  ?pantoprazole (PROTONIX) 40 MG tablet Take 1 tablet (40 mg total) by mouth daily. Crush pill and place through feeding tube as educated to prior, flush feeding tube after pill is given 08/25/21  Yes Lucretia Roers, MD  ?Water For Irrigation, Sterile (FREE WATER) SOLN Place 280 mLs into feeding tube in the morning, at noon, and at bedtime. 04/27/21  Yes Erick Blinks, MD  ?ACCU-CHEK AVIVA PLUS test strip 1 each 4 (four) times daily.  07/03/21   [provider]  ?   ? ?Allergies    ?Cyclobenzaprine hcl   ? ?Review of Systems   ?Review of Systems  ?All other systems reviewed and are negative. ? ?Physical Exam ?Updated Vital Signs ?BP 117/65   Pulse 100   Temp 98.8 ?F (37.1 ?C) (Oral)   Resp 15   Ht 5\' 4"  (1.626 m)   Wt 48.1 kg   SpO2 98%   BMI 18.19 kg/m?  ?Physical Exam ?Vitals and nursing note reviewed.  ?Constitutional:   ?   Appearance: She is well-developed.  ?HENT:  ?   Head: Atraumatic.  ?Cardiovascular:  ?   Rate and Rhythm: Normal rate.  ?Pulmonary:  ?   Effort: Pulmonary effort is normal.  ?Abdominal:  ?   Comments: G-tube still in the stoma, but appears loose, deflated balloon noted.  ?Musculoskeletal:  ?   Cervical back: Normal range of motion and neck supple.  ?Skin: ?   General: Skin is warm and dry.  ?Neurological:  ?   Mental Status: She is alert and oriented to person, place, and time.  ? ? ?ED Results / Procedures / Treatments   ?Labs ?(all labs ordered are listed, but only abnormal results are displayed) ?Labs Reviewed - No data to display ? ?EKG ?None ? ?Radiology ?DG ABDOMEN PEG TUBE LOCATION ? ?Result Date: 08/31/2021 ?CLINICAL DATA:  Check gastrostomy catheter placement EXAM: ABDOMEN - 1 VIEW COMPARISON:  None. FINDINGS: Contrast was injected through indwelling  gastrostomy catheter with free flow of contrast into the stomach. Balloon is noted deep within the gastric fundus. IMPRESSION: Gastric catheter is noted within the stomach. Electronically Signed   By: Alcide Clever M.D.   On: 08/31/2021 22:58   ? ?Procedures ?Gastrostomy tube replacement ? ?Date/Time: 08/31/2021 11:09 PM ?Performed by: Derwood Kaplan, MD ?Authorized by: Derwood Kaplan, MD  ?Consent: Verbal consent obtained. ?Risks and benefits: risks, benefits and alternatives were discussed ?Consent given by: patient and guardian ?Patient understanding: patient states understanding of the procedure being performed ?Patient identity confirmed: arm  band ?Time out: Immediately prior to procedure a "time out" was called to verify the correct patient, procedure, equipment, support staff and site/side marked as required. ?Local anesthesia used: no ? ?Anesthesia: ?Local anesthesia used: no ? ?Sedation: ?Patient sedated: no ? ? ?  ? ? ?Medications Ordered in ED ?Medications  ?iohexol (OMNIPAQUE) 300 MG/ML solution 50 mL (has no administration in time range)  ? ? ?ED Course/ Medical Decision Making/ A&P ?  ?                        ?Medical Decision Making ?Amount and/or Complexity of Data Reviewed ?Radiology: ordered. ? ?Risk ?Prescription drug management. ? ?Pt comes in with cc of G tube dislodgement. ? ?G-tube was replaced.  X-ray ordered, visualized independently.  Contrast material in the lumen. ? ?No complications from the procedure.  Stable for discharge. ? ? ?Final Clinical Impression(s) / ED Diagnoses ?Final diagnoses:  ?Gastrostomy tube in place [facility])  ? ? ?Rx / DC Orders ?ED Discharge Orders   ? ? None  ? ?  ? ? ?  ?Derwood Kaplan, MD ?08/31/21 2310 ? ?

## 2021-08-31 NOTE — ED Notes (Signed)
X-ray at bedside

## 2021-08-31 NOTE — ED Triage Notes (Signed)
POV from home. Cc of stating her feeding tube has started to come out. Says that her balloon "popped" and now it is started to come out.   ?Got it in June of last year ?

## 2021-09-02 ENCOUNTER — Telehealth: Payer: Self-pay | Admitting: *Deleted

## 2021-09-02 NOTE — Telephone Encounter (Signed)
Call placed to patient daughter Rannie.  ? ?Reports that patient left ER with tube taped down for support. Once tape was removed, the tube was displaced as the bulb was not inflated. States that tube was reinserted and inflated correctly. Gastric fluids noted in aspiration after reinsertion.  ?

## 2021-09-02 NOTE — Telephone Encounter (Signed)
Received VM from patient daughter Rannie dated 09/01/2021 @ 5:22 pM [phone]~ telephone.  ? ?Reports that after patient was seen in ER on 08/31/2021 to have PEG tube replaced, she noted that tube was displaced out of patient and in floor on 09/01/2021. States that she cleaned the tube and replaced tube at home.  ? ?Call placed to daughter for more information. VM noted full.  ?

## 2021-09-09 ENCOUNTER — Encounter: Payer: Self-pay | Admitting: General Surgery

## 2021-09-09 ENCOUNTER — Other Ambulatory Visit: Payer: Self-pay

## 2021-09-09 ENCOUNTER — Ambulatory Visit: Payer: Medicare Other | Admitting: General Surgery

## 2021-09-09 VITALS — BP 109/72 | HR 98 | Temp 97.9°F | Resp 12 | Ht 64.0 in | Wt 106.0 lb

## 2021-09-09 DIAGNOSIS — T85848A Pain due to other internal prosthetic devices, implants and grafts, initial encounter: Secondary | ICD-10-CM

## 2021-09-09 MED ORDER — ACETAMINOPHEN 160 MG/5ML PO ELIX: 500.0000 mg | ORAL_SOLUTION | ORAL | 12 refills | Status: DC | PRN

## 2021-09-09 NOTE — Patient Instructions (Signed)
Will try to work on getting you a dietitian to see you for tube feed and free water recommendations.  ?Will refill the tylenol. ?Continue to zinc oxide to the area around the tube. ?Secure the tube down on the left side to keep it from moving.  ?Ok to cover with a Control and instrumentation engineer.  ? ? ?

## 2021-09-10 NOTE — Progress Notes (Signed)
[facility] Note  ? ?HPI:  ?81 y.o. Female presents to clinic for follow-up evaluation of her PEG tube. This had to be replaced in the ED. She skin has improved greatly with zinc oxide. She says pain is worse with the tubing moving.  ? ?Review of Systems:  ?Dizziness  ?Improved skin pain ?All other review of systems: otherwise negative  ? ?Vital Signs:  ?BP 109/72   Pulse 98   Temp 97.9 ?F (36.6 ?C) (Other (Comment))   Resp 12   Ht 5\' 4"  (1.626 m)   Wt 106 lb (48.1 kg)   SpO2 97%   BMI 18.19 kg/m?   ? ?Physical Exam:  ?Physical Exam ?Vitals reviewed.  ?Cardiovascular:  ?   Rate and Rhythm: Normal rate.  ?Pulmonary:  ?   Effort: Pulmonary effort is normal.  ?Abdominal:  ?   General: There is no distension.  ?   Palpations: Abdomen is soft.  ?   Tenderness: There is abdominal tenderness.  ?   Comments: PEG in place, no erythema of the skin, bumper adjusted, PEG taped down  ?Neurological:  ?   Mental Status: She is alert.  ? ? ? ?Assessment:  ?81 y.o. yo Female with PEG in place and improved skin around the area and drainage.  ? ?Plan:  ?Will try to work on getting you a dietitian to see you for tube feed and free water recommendations, let us know if you want referral or if you want to see what the insurance is offering for dietitian at home  ?Will refill the tylenol. ?Continue to zinc oxide to the area around the tube. ?Secure the tube down on the left side to keep it from moving.  ?Ok to cover with a Radio producer.  ? ?PRN follow up ? ?Curlene Labrum, MD ?[facility] ?[place], [place] [address] ?[phone] (office) ? ?

## 2021-09-25 ENCOUNTER — Telehealth: Payer: Self-pay | Admitting: Internal Medicine

## 2021-09-25 ENCOUNTER — Other Ambulatory Visit: Payer: Self-pay | Admitting: Family Medicine

## 2021-09-25 DIAGNOSIS — T85848A Pain due to other internal prosthetic devices, implants and grafts, initial encounter: Secondary | ICD-10-CM

## 2021-09-25 MED ORDER — ACETAMINOPHEN 160 MG/5ML PO ELIX: 500.0000 mg | ORAL_SOLUTION | ORAL | 12 refills | Status: AC | PRN

## 2021-09-25 NOTE — Telephone Encounter (Signed)
Rannie, patient's daughter, called the office requesting to schedule an cortisone injection because her mother is in a lot of pain. Informed patient that Dr. Dimple Casey was currently out of the office and that he would not be back until the middle of April. Rannie stated her mother really needs it and would like a return call with options. Rannie states her mother does not have an orthopedic doctor. [phone] ?

## 2021-09-25 NOTE — Progress Notes (Signed)
Pts mother called stating that pharm did not get rx for tylenol. Resent rx.  ?

## 2021-09-25 NOTE — Telephone Encounter (Signed)
I called patient's daughter, patient scheduled 10/09/2021 for arm inj ?

## 2021-10-09 ENCOUNTER — Ambulatory Visit: Payer: Medicare Other | Admitting: Internal Medicine

## 2021-10-18 NOTE — Progress Notes (Deleted)
Office Visit Note  Patient: Kathryn [name]             Date of Birth: [birth year]           MRN: [id number]             PCP: Leeanne Rio, MD Referring: Leeanne Rio, MD Visit Date: 10/19/2021   Subjective:  No chief complaint on file.   History of Present Illness: Kathryn [name] is a 81 y.o. female here for follow up for arthralgias and weakness.***   Previous HPI 07/13/21 Kathryn [name] is a 81 y.o. female here for follow up for arthralgias and weakness possible myositis. IM steroid injection at previous visit with a very good initial improvement in mobility and strength, had been trouble with tablet medication previously due to obstructed J tube this is improved with switching to a G-tube though continues having a lot of oral secretion problems with the esophageal dysmotility.  She has made some additional improvement in strength and mobility working with physical therapy although still far below her baseline before problem started last year.  A lot of stiffness and relative weakness affecting the shoulders and hips bilaterally without much pain.   Previous HPI 02/23/21 Kathryn [name] is a 81 y.o. female here for evaluation of joint pains and weakness with concern for possible PMR also for nontraumatic rhabdomyolysis.  Her medical history significant for esophageal dysphagia with previous stricture formation, type 2 diabetes, and chronic anemia with some a plasma cell dyscrasia but no prior diagnosis of chronic inflammatory disease. Her recent medical course has been complicated by dehydration and limited nutrition with reliance on gastrostomy tube.  No specific inciting cause was identified for the initial hospitalization and weakness with CK elevation since the original hospitalization, although subsequently was readmitted with complications particularly with repeated obstruction of gastrostomy tubing interfering with medication and nutrition.  She is experienced 29 pound total  weight loss since documented weight at hospital admission in April of this year.  She continues to feel overall weak symptoms are actually partially improved compared to a few months ago but still much worse than her previous baseline before this entire process started about 5 months ago.  Initial onset was bilateral with involvement of upper and lower extremities first came to additional attention from her decreased stability and speed with walking and then became completely unable to transfer unaided.  Joint pain initially in shoulders and hips leading to initial concern of PMR.  She has had previous shoulder pains related to rotator cuff arthropathy on the right and possibly left but these have not usually limit her activity to this extent. She received right shoulder steroid injection in May with orthopedic surgery clinic which helped somewhat locally.   Labs reviewed 09/2020 ANA pos SSA > 8.0 dsDNA, RNP, SM, Scl-70, SSB, chromatin, Jo-1, centromere neg RF neg ESR 44 CK 1,206   No Rheumatology ROS completed.   PMFS History:  Patient Active Problem List   Diagnosis Date Noted   Pain around PEG tube site 06/25/2021   Infected tooth 06/25/2021   Jejunostomy malfunction ([place])    Granulation tissue of site of gastrostomy 04/21/2021   PEG tube malfunction ([place]) 03/02/2021   Protein-calorie malnutrition, severe 03/02/2021   Type 2 diabetes mellitus ([place]) 03/01/2021   Chronic hyponatremia 03/01/2021   Encounter for gastrojejunal (GJ) tube placement 03/01/2021   Positive ANA (antinuclear antibody) 02/23/2021   Hx SBO 02/23/2021   Gastrojejunostomy tube status ([place]) 02/19/2021  Pressure injury of skin 01/28/2021   Constipation    Dislodged gastrostomy tube    Hypoactive bowel sounds    Abdominal pain 01/14/2021   Generalized weakness 01/14/2021   Dehydration 01/14/2021   Normocytic anemia 01/14/2021   Leukocytosis 01/14/2021   Hyperglycemia 01/14/2021   Hypoalbuminemia due to  protein-calorie malnutrition ([place]) 01/14/2021   Prolonged QT interval 01/14/2021   Malnutrition of moderate degree 01/14/2021   Achalasia    Nausea and vomiting 01/13/2021   Colitis 12/25/2020   COVID-19 virus infection 12/25/2020   Loss of weight    Dysphagia    Early satiety    UTI (urinary tract infection) 10/23/2020   Elevated d-dimer 10/23/2020   Tachycardia 10/23/2020   Abnormal computed tomography of esophagus 10/23/2020   Mild protein-calorie malnutrition ([place]) 10/23/2020   Rhabdomyolysis 10/23/2020   Hypertension associated with diabetes ([place]) 10/23/2020   Sensorineural hearing loss (SNHL), bilateral 03/02/2016   Family history of colon cancer 04/27/2015   Family history of thyroid cancer 04/27/2015   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   Conjunctivochalasis of both eyes 12/04/2014   Nuclear sclerosis of both eyes 12/04/2014   H/O rotator cuff syndrome 07/12/2012   Rotator cuff tendinitis 07/12/2012   SHOULDER PAIN 10/30/2007    Past Medical History:  Diagnosis Date   Acid reflux    Diabetes ([place])    controlled with diet.    HTN (hypertension)     Family History  Problem Relation Age of Onset   Colon cancer Father        diagnosed in his late 88s.   COPD Son    Esophageal cancer Neg Hx    Stomach cancer Neg Hx    Past Surgical History:  Procedure Laterality Date   ABDOMINAL HYSTERECTOMY     BOTOX INJECTION N/A 01/15/2021   Procedure: BOTOX INJECTION;  Surgeon: Eloise Harman, DO;  [facility];  Service: Endoscopy;  Laterality: N/A;   BOWEL RESECTION N/A 01/22/2021   Procedure: SMALL BOWEL RESECTION;  Surgeon: Virl Cagey, MD;  Location: [facility];  Service: General;  Laterality: N/A;   CHOLECYSTECTOMY     ESOPHAGEAL DILATION N/A 10/24/2020   Procedure: ESOPHAGEAL DILATION;  Surgeon: Daneil Dolin, MD;  [facility];  Service: Endoscopy;  Laterality: N/A;   ESOPHAGOGASTRODUODENOSCOPY N/A 10/24/2020   Procedure:  ESOPHAGOGASTRODUODENOSCOPY (EGD);  Surgeon: Daneil Dolin, MD;  [facility];  Service: Endoscopy;  Laterality: N/A;   ESOPHAGOGASTRODUODENOSCOPY (EGD) WITH PROPOFOL N/A 01/15/2021   Procedure: ESOPHAGOGASTRODUODENOSCOPY (EGD) WITH PROPOFOL;  Surgeon: Eloise Harman, DO;  [facility];  Service: Endoscopy;  Laterality: N/A;   GASTROSTOMY N/A 01/23/2021   Procedure: REINSERTION OF GASTROSTOMY TUBE;  Surgeon: Virl Cagey, MD;  Location: [facility];  Service: General;  Laterality: N/A;   GASTROSTOMY N/A 01/22/2021   Procedure: INSERTION OF GASTROSTOMY TUBE;  Surgeon: Virl Cagey, MD;  Location: [facility];  Service: General;  Laterality: N/A;   Histosalpingogram     IR GASTR TUBE CONVERT GASTR-JEJ PER W/FL MOD SED  02/02/2021   IR [place] TUBE CHANGE  03/04/2021   IR [place] TUBE CHANGE  03/23/2021   LYSIS OF ADHESION  01/22/2021   Procedure: LYSIS OF ADHESION;  Surgeon: Virl Cagey, MD;  Location: [facility];  Service: General;;   Social History   Social History Narrative   Not on file   Immunization History  Administered Date(s) Administered   Influenza Inj Mdck Quad Pf 05/26/2020  Influenza,inj,Quad PF,6+ Mos 04/19/2017, 04/12/2018, 05/31/2019   Influenza-Unspecified 04/07/2015   Pneumococcal Conjugate-13 04/01/2014   Pneumococcal Polysaccharide-23 06/13/2006   Tetanus 07/25/1996     Objective: Vital Signs: There were no vitals taken for this visit.   Physical Exam   Musculoskeletal Exam: ***  CDAI Exam: CDAI Score: -- Patient Global: --; Provider Global: -- Swollen: --; Tender: -- Joint Exam 10/19/2021   No joint exam has been documented for this visit   There is currently no information documented on the homunculus. Go to the Rheumatology activity and complete the homunculus joint exam.  Investigation: No additional findings.  Imaging: No results found.  Recent Labs: Lab Results  Component Value Date   WBC 4.4 04/22/2021   HGB 9.9 (L)  04/22/2021   PLT 275 04/22/2021   NA 132 (L) 04/22/2021   K 3.6 04/22/2021   CL 101 04/22/2021   CO2 25 04/22/2021   GLUCOSE 155 (H) 04/22/2021   BUN 18 04/22/2021   CREATININE 0.45 04/22/2021   BILITOT 0.5 04/22/2021   ALKPHOS 74 04/22/2021   AST 29 04/22/2021   ALT 20 04/22/2021   PROT 6.5 04/22/2021   ALBUMIN 2.7 (L) 04/22/2021   CALCIUM 8.9 04/22/2021    Speciality Comments: No specialty comments available.  Procedures:  No procedures performed Allergies: Cyclobenzaprine hcl   Assessment / Plan:     Visit Diagnoses: No diagnosis found.  ***  Orders: No orders of the defined types were placed in this encounter.  No orders of the defined types were placed in this encounter.    Follow-Up Instructions: No follow-ups on file.   Collier Salina, MD  Note - This record has been created using [employer].  Chart creation errors have been sought, but may not always  have been located. Such creation errors do not reflect on  the standard of medical care.

## 2021-10-19 ENCOUNTER — Ambulatory Visit: Payer: Medicare Other | Admitting: Internal Medicine

## 2021-11-04 NOTE — Progress Notes (Signed)
? ?Office Visit Note ? ?Patient: Kathryn [name]             ?Date of Birth: July 31, 1940           ?MRN: [id number]             ?PCP: Leeanne Rio, MD ?Referring: Leeanne Rio, MD ?Visit Date: 11/09/2021 ? ? ?Subjective:  ?Pain of the Left Shoulder and Pain of the Right Shoulder ? ? ?History of Present Illness: Kathryn [name] is a 81 y.o. female here for follow up for arthralgias and weakness possible myositis.  She had been doing pretty well after the previous visit but last month developed worsening generalized weakness work-up with findings consistent for UTI though she did not have any local genitourinary symptoms.  She completed antibiotic treatment for this with some improvement to her overall symptoms but remains weaker and less mobile limiting her shoulders and hips in particular. ? ?Kathryn [name]'s daughter is present with her today assisting with mobility and collateral history. ? ?Previous HPI ?07/13/2021 ?Kathryn [name] is a 81 y.o. female here for follow up for arthralgias and weakness possible myositis. IM steroid injection at previous visit with a very good initial improvement in mobility and strength, had been trouble with tablet medication previously due to obstructed J tube this is improved with switching to a G-tube though continues having a lot of oral secretion problems with the esophageal dysmotility.  She has made some additional improvement in strength and mobility working with physical therapy although still far below her baseline before problem started last year.  A lot of stiffness and relative weakness affecting the shoulders and hips bilaterally without much pain. ?  ?Previous HPI ?03/16/21 ?Kathryn [name] is a 81 y.o. female here for follow up for weakness, joint pains, and positive autoantibody serology. Lab workup at that time demonstrated further increase in ESR and myositis panel EJ Abs positive. Since our last visit she had to return to the hospital for 1 week due to repeat  obstruction and dislodged J tube. She is working with physical therapy with some improvement in her symptoms although remains weak. Today she has increased coughing and secretions she attributes to being without her usual oral suction at facility.  ?  ?Previous HPI ?02/23/21 ?Kathryn [name] is a 81 y.o. female here for evaluation of joint pains and weakness with concern for possible PMR also for nontraumatic rhabdomyolysis.  Her medical history significant for esophageal dysphagia with previous stricture formation, type 2 diabetes, and chronic anemia with some a plasma cell dyscrasia but no prior diagnosis of chronic inflammatory disease. Her recent medical course has been complicated by dehydration and limited nutrition with reliance on gastrostomy tube.  No specific inciting cause was identified for the initial hospitalization and weakness with CK elevation since the original hospitalization, although subsequently was readmitted with complications particularly with repeated obstruction of gastrostomy tubing interfering with medication and nutrition.  She is experienced 29 pound total weight loss since documented weight at hospital admission in April of this year.  She continues to feel overall weak symptoms are actually partially improved compared to a few months ago but still much worse than her previous baseline before this entire process started about 5 months ago.  Initial onset was bilateral with involvement of upper and lower extremities first came to additional attention from her decreased stability and speed with walking and then became completely unable to transfer unaided.  Joint pain initially in shoulders and  hips leading to initial concern of PMR.  She has had previous shoulder pains related to rotator cuff arthropathy on the right and possibly left but these have not usually limit her activity to this extent. She received right shoulder steroid injection in May with orthopedic surgery clinic which  helped somewhat locally. ?  ?Labs reviewed ?09/2020 ?ANA pos ?SSA > 8.0 ?dsDNA, RNP, SM, Scl-70, SSB, chromatin, Jo-1, centromere neg ?RF neg ?ESR 44 ?CK 1,206 ? ? ?Review of Systems  ?Constitutional:  Positive for fatigue.  ?HENT:  Negative for mouth dryness.   ?Eyes:  Negative for dryness.  ?Respiratory:  Negative for shortness of breath.   ?Cardiovascular:  Negative for swelling in legs/feet.  ?Gastrointestinal:  Negative for constipation.  ?Endocrine: Negative for excessive thirst.  ?Genitourinary:  Negative for difficulty urinating.  ?Musculoskeletal:  Positive for joint pain, gait problem, joint pain, muscle weakness, morning stiffness and muscle tenderness.  ?Skin:  Negative for rash.  ?Allergic/Immunologic: Negative for susceptible to infections.  ?Neurological:  Positive for weakness.  ?Hematological:  Negative for bruising/bleeding tendency.  ?Psychiatric/Behavioral:  Positive for sleep disturbance.   ? ?PMFS History:  ?Patient Active Problem List  ? Diagnosis Date Noted  ? Pain around PEG tube site 06/25/2021  ? Infected tooth 06/25/2021  ? Jejunostomy malfunction ([place])   ? Granulation tissue of site of gastrostomy 04/21/2021  ? PEG tube malfunction ([place]) 03/02/2021  ? Protein-calorie malnutrition, severe 03/02/2021  ? Type 2 diabetes mellitus ([place]) 03/01/2021  ? Chronic hyponatremia 03/01/2021  ? Encounter for gastrojejunal ([place]) tube placement 03/01/2021  ? Positive ANA (antinuclear antibody) 02/23/2021  ? Hx SBO 02/23/2021  ? Gastrojejunostomy tube status ([place]) 02/19/2021  ? Pressure injury of skin 01/28/2021  ? Constipation   ? Dislodged gastrostomy tube   ? Hypoactive bowel sounds   ? Abdominal pain 01/14/2021  ? Generalized weakness 01/14/2021  ? Dehydration 01/14/2021  ? Normocytic anemia 01/14/2021  ? Leukocytosis 01/14/2021  ? Hyperglycemia 01/14/2021  ? Hypoalbuminemia due to protein-calorie malnutrition ([place]) 01/14/2021  ? Prolonged QT interval 01/14/2021  ? Malnutrition of moderate degree  01/14/2021  ? Achalasia   ? Nausea and vomiting 01/13/2021  ? Colitis 12/25/2020  ? COVID-19 virus infection 12/25/2020  ? Loss of weight   ? Dysphagia   ? Early satiety   ? UTI (urinary tract infection) 10/23/2020  ? Elevated d-dimer 10/23/2020  ? Tachycardia 10/23/2020  ? Abnormal computed tomography of esophagus 10/23/2020  ? Mild protein-calorie malnutrition ([place]) 10/23/2020  ? Rhabdomyolysis 10/23/2020  ? Hypertension associated with diabetes ([place]) 10/23/2020  ? Sensorineural hearing loss (SNHL), bilateral 03/02/2016  ? Family history of colon cancer 04/27/2015  ? Family history of thyroid cancer 04/27/2015  ? 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  ? Conjunctivochalasis of both eyes 12/04/2014  ? Nuclear sclerosis of both eyes 12/04/2014  ? H/O rotator cuff syndrome 07/12/2012  ? Rotator cuff tendinitis 07/12/2012  ? SHOULDER PAIN 10/30/2007  ?  ?Past Medical History:  ?Diagnosis Date  ? Acid reflux   ? Diabetes ([place])   ? controlled with diet.   ? HTN (hypertension)   ?  ?Family History  ?Problem Relation Age of Onset  ? Colon cancer Father   ?     diagnosed in his late 27s.  ? COPD Son   ? Esophageal cancer Neg Hx   ? Stomach cancer Neg Hx   ? ?Past Surgical History:  ?Procedure Laterality Date  ? ABDOMINAL HYSTERECTOMY    ? BOTOX INJECTION N/A 01/15/2021  ?  Procedure: BOTOX INJECTION;  Surgeon: Eloise Harman, DO;  [facility];  Service: Endoscopy;  Laterality: N/A;  ? BOWEL RESECTION N/A 01/22/2021  ? Procedure: SMALL BOWEL RESECTION;  Surgeon: Virl Cagey, MD;  Location: [facility];  Service: General;  Laterality: N/A;  ? CHOLECYSTECTOMY    ? ESOPHAGEAL DILATION N/A 10/24/2020  ? Procedure: ESOPHAGEAL DILATION;  Surgeon: Daneil Dolin, MD;  [facility];  Service: Endoscopy;  Laterality: N/A;  ? ESOPHAGOGASTRODUODENOSCOPY N/A 10/24/2020  ? Procedure: ESOPHAGOGASTRODUODENOSCOPY (EGD);  Surgeon: Daneil Dolin, MD;  [facility];  Service: Endoscopy;  Laterality: N/A;  ?  ESOPHAGOGASTRODUODENOSCOPY (EGD) WITH PROPOFOL N/A 01/15/2021  ? Procedure: ESOPHAGOGASTRODUODENOSCOPY (EGD) WITH PROPOFOL;  Surgeon: Eloise Harman, DO;  [facility];  Service: Endoscopy;  Laterality: N/A;

## 2021-11-09 ENCOUNTER — Ambulatory Visit (INDEPENDENT_AMBULATORY_CARE_PROVIDER_SITE_OTHER): Payer: Medicare Other | Admitting: Internal Medicine

## 2021-11-09 ENCOUNTER — Encounter: Payer: Self-pay | Admitting: Internal Medicine

## 2021-11-09 VITALS — BP 118/70 | HR 102 | Resp 16 | Ht 63.0 in

## 2021-11-09 DIAGNOSIS — M25512 Pain in left shoulder: Secondary | ICD-10-CM

## 2021-11-09 DIAGNOSIS — M7502 Adhesive capsulitis of left shoulder: Secondary | ICD-10-CM | POA: Diagnosis not present

## 2021-11-09 DIAGNOSIS — M7500 Adhesive capsulitis of unspecified shoulder: Secondary | ICD-10-CM | POA: Insufficient documentation

## 2021-11-09 DIAGNOSIS — K220 Achalasia of cardia: Secondary | ICD-10-CM | POA: Diagnosis not present

## 2021-11-09 DIAGNOSIS — M6282 Rhabdomyolysis: Secondary | ICD-10-CM

## 2021-11-09 DIAGNOSIS — M7501 Adhesive capsulitis of right shoulder: Secondary | ICD-10-CM | POA: Diagnosis not present

## 2021-11-09 DIAGNOSIS — R768 Other specified abnormal immunological findings in serum: Secondary | ICD-10-CM | POA: Diagnosis not present

## 2021-11-10 LAB — CK: Total CK: 169 U/L — ABNORMAL HIGH (ref 29–143)

## 2021-11-10 MED ORDER — TRIAMCINOLONE ACETONIDE 40 MG/ML IJ SUSP
40.0000 mg | INTRAMUSCULAR | Status: AC | PRN
  Administered 2021-11-09: 40 mg via INTRA_ARTICULAR

## 2021-11-10 MED ORDER — LIDOCAINE HCL 1 % IJ SOLN
3.0000 mL | INTRAMUSCULAR | Status: AC | PRN
  Administered 2021-11-09: 3 mL

## 2021-11-10 NOTE — Progress Notes (Signed)
CK level increased somewhat up to 169, this is still much lower than last year's results but up from 57 last visit. We should plan to keep the scheduled follow up next month we can recheck this see if it is a trend or could be a temporary change related to her recent infection.

## 2021-12-13 NOTE — Progress Notes (Signed)
Office Visit Note  Patient: Kathryn [name]             Date of Birth: 02/10/1941           MRN: [id number]             PCP: Leeanne Rio, MD Referring: Leeanne Rio, MD Visit Date: 12/14/2021   Subjective:  No chief complaint on file.   History of Present Illness: Kathryn [name] is a 81 y.o. female here for follow up for arthralgias and weakness possible inflammatory myositis symptoms. Bilateral subacromial bursa steroid injection at last visit.***   Previous HPI 11/09/21 Kathryn [name] is a 81 y.o. female here for follow up for arthralgias and weakness possible myositis.  She had been doing pretty well after the previous visit but last month developed worsening generalized weakness work-up with findings consistent for UTI though she did not have any local genitourinary symptoms.  She completed antibiotic treatment for this with some improvement to her overall symptoms but remains weaker and less mobile limiting her shoulders and hips in particular.   Ms. Pulley daughter is present with her today assisting with mobility and collateral history.   Previous HPI 07/13/2021 Kathryn [name] is a 81 y.o. female here for follow up for arthralgias and weakness possible myositis. IM steroid injection at previous visit with a very good initial improvement in mobility and strength, had been trouble with tablet medication previously due to obstructed J tube this is improved with switching to a G-tube though continues having a lot of oral secretion problems with the esophageal dysmotility.  She has made some additional improvement in strength and mobility working with physical therapy although still far below her baseline before problem started last year.  A lot of stiffness and relative weakness affecting the shoulders and hips bilaterally without much pain.   Previous HPI 03/16/21 Kathryn [name] is a 81 y.o. female here for follow up for weakness, joint pains, and positive autoantibody  serology. Lab workup at that time demonstrated further increase in ESR and myositis panel EJ Abs positive. Since our last visit she had to return to the hospital for 1 week due to repeat obstruction and dislodged J tube. She is working with physical therapy with some improvement in her symptoms although remains weak. Today she has increased coughing and secretions she attributes to being without her usual oral suction at facility.    Previous HPI 02/23/21 Kathryn [name] is a 81 y.o. female here for evaluation of joint pains and weakness with concern for possible PMR also for nontraumatic rhabdomyolysis.  Her medical history significant for esophageal dysphagia with previous stricture formation, type 2 diabetes, and chronic anemia with some a plasma cell dyscrasia but no prior diagnosis of chronic inflammatory disease. Her recent medical course has been complicated by dehydration and limited nutrition with reliance on gastrostomy tube.  No specific inciting cause was identified for the initial hospitalization and weakness with CK elevation since the original hospitalization, although subsequently was readmitted with complications particularly with repeated obstruction of gastrostomy tubing interfering with medication and nutrition.  She is experienced 29 pound total weight loss since documented weight at hospital admission in April of this year.  She continues to feel overall weak symptoms are actually partially improved compared to a few months ago but still much worse than her previous baseline before this entire process started about 5 months ago.  Initial onset was bilateral with involvement of upper and lower  extremities first came to additional attention from her decreased stability and speed with walking and then became completely unable to transfer unaided.  Joint pain initially in shoulders and hips leading to initial concern of PMR.  She has had previous shoulder pains related to rotator cuff arthropathy  on the right and possibly left but these have not usually limit her activity to this extent. She received right shoulder steroid injection in May with orthopedic surgery clinic which helped somewhat locally.   Labs reviewed 09/2020 ANA pos SSA > 8.0 dsDNA, RNP, SM, Scl-70, SSB, chromatin, Jo-1, centromere neg RF neg ESR 44 CK 1,206   No Rheumatology ROS completed.   PMFS History:  Patient Active Problem List   Diagnosis Date Noted   Frozen shoulder syndrome 11/09/2021   Pain around PEG tube site 06/25/2021   Infected tooth 06/25/2021   Jejunostomy malfunction ([place])    Granulation tissue of site of gastrostomy 04/21/2021   PEG tube malfunction ([place]) 03/02/2021   Protein-calorie malnutrition, severe 03/02/2021   Type 2 diabetes mellitus ([place]) 03/01/2021   Chronic hyponatremia 03/01/2021   Encounter for gastrojejunal (GJ) tube placement 03/01/2021   Positive ANA (antinuclear antibody) 02/23/2021   Hx SBO 02/23/2021   Gastrojejunostomy tube status ([place]) 02/19/2021   Pressure injury of skin 01/28/2021   Constipation    Dislodged gastrostomy tube    Hypoactive bowel sounds    Abdominal pain 01/14/2021   Generalized weakness 01/14/2021   Dehydration 01/14/2021   Normocytic anemia 01/14/2021   Leukocytosis 01/14/2021   Hyperglycemia 01/14/2021   Hypoalbuminemia due to protein-calorie malnutrition (HCC) 01/14/2021   Prolonged QT interval 01/14/2021   Malnutrition of moderate degree 01/14/2021   Achalasia    Nausea and vomiting 01/13/2021   Colitis 12/25/2020   COVID-19 virus infection 12/25/2020   Loss of weight    Dysphagia    Early satiety    UTI (urinary tract infection) 10/23/2020   Elevated d-dimer 10/23/2020   Tachycardia 10/23/2020   Abnormal computed tomography of esophagus 10/23/2020   Mild protein-calorie malnutrition ([place]) 10/23/2020   Rhabdomyolysis 10/23/2020   Hypertension associated with diabetes ([place]) 10/23/2020   Sensorineural hearing loss (SNHL),  bilateral 03/02/2016   Family history of colon cancer 04/27/2015   Family history of thyroid cancer 04/27/2015   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   Conjunctivochalasis of both eyes 12/04/2014   Nuclear sclerosis of both eyes 12/04/2014   H/O rotator cuff syndrome 07/12/2012   Rotator cuff tendinitis 07/12/2012   SHOULDER PAIN 10/30/2007    Past Medical History:  Diagnosis Date   Acid reflux    Diabetes ([place])    controlled with diet.    HTN (hypertension)     Family History  Problem Relation Age of Onset   Colon cancer Father        diagnosed in his late 39s.   COPD Son    Esophageal cancer Neg Hx    Stomach cancer Neg Hx    Past Surgical History:  Procedure Laterality Date   ABDOMINAL HYSTERECTOMY     BOTOX INJECTION N/A 01/15/2021   Procedure: BOTOX INJECTION;  Surgeon: Eloise Harman, DO;  [facility];  Service: Endoscopy;  Laterality: N/A;   BOWEL RESECTION N/A 01/22/2021   Procedure: SMALL BOWEL RESECTION;  Surgeon: Virl Cagey, MD;  Location: [facility];  Service: General;  Laterality: N/A;   CHOLECYSTECTOMY     ESOPHAGEAL DILATION N/A 10/24/2020   Procedure: ESOPHAGEAL DILATION;  Surgeon: Daneil Dolin, MD;  Location[facility];  Service: Endoscopy;  Laterality: N/A;   ESOPHAGOGASTRODUODENOSCOPY N/A 10/24/2020   Procedure: ESOPHAGOGASTRODUODENOSCOPY (EGD);  Surgeon: Daneil Dolin, MD;  [facility];  Service: Endoscopy;  Laterality: N/A;   ESOPHAGOGASTRODUODENOSCOPY (EGD) WITH PROPOFOL N/A 01/15/2021   Procedure: ESOPHAGOGASTRODUODENOSCOPY (EGD) WITH PROPOFOL;  Surgeon: Eloise Harman, DO;  [facility];  Service: Endoscopy;  Laterality: N/A;   GASTROSTOMY N/A 01/23/2021   Procedure: REINSERTION OF GASTROSTOMY TUBE;  Surgeon: Virl Cagey, MD;  Location: [facility];  Service: General;  Laterality: N/A;   GASTROSTOMY N/A 01/22/2021   Procedure: INSERTION OF GASTROSTOMY TUBE;  Surgeon: Virl Cagey, MD;  Location: [facility];  Service: General;  Laterality: N/A;   Histosalpingogram     IR GASTR TUBE CONVERT GASTR-JEJ PER W/FL MOD SED  02/02/2021   IR [place] TUBE CHANGE  03/04/2021   IR [place] TUBE CHANGE  03/23/2021   LYSIS OF ADHESION  01/22/2021   Procedure: LYSIS OF ADHESION;  Surgeon: Virl Cagey, MD;  Location: [facility];  Service: General;;   Social History   Social History Narrative   Not on file   Immunization History  Administered Date(s) Administered   Influenza Inj Mdck Quad Pf 05/26/2020   Influenza,inj,Quad PF,6+ Mos 04/19/2017, 04/12/2018, 05/31/2019   Influenza-Unspecified 04/07/2015   Pneumococcal Conjugate-13 04/01/2014   Pneumococcal Polysaccharide-23 06/13/2006   Tetanus 07/25/1996     Objective: Vital Signs: There were no vitals taken for this visit.   Physical Exam   Musculoskeletal Exam: ***  CDAI Exam: CDAI Score: -- Patient Global: --; Provider Global: -- Swollen: --; Tender: -- Joint Exam 12/14/2021   No joint exam has been documented for this visit   There is currently no information documented on the homunculus. Go to the Rheumatology activity and complete the homunculus joint exam.  Investigation: No additional findings.  Imaging: No results found.  Recent Labs: Lab Results  Component Value Date   WBC 4.4 04/22/2021   HGB 9.9 (L) 04/22/2021   PLT 275 04/22/2021   NA 132 (L) 04/22/2021   K 3.6 04/22/2021   CL 101 04/22/2021   CO2 25 04/22/2021   GLUCOSE 155 (H) 04/22/2021   BUN 18 04/22/2021   CREATININE 0.45 04/22/2021   BILITOT 0.5 04/22/2021   ALKPHOS 74 04/22/2021   AST 29 04/22/2021   ALT 20 04/22/2021   PROT 6.5 04/22/2021   ALBUMIN 2.7 (L) 04/22/2021   CALCIUM 8.9 04/22/2021    Speciality Comments: No specialty comments available.  Procedures:  No procedures performed Allergies: Cyclobenzaprine hcl   Assessment / Plan:     Visit Diagnoses: No diagnosis found.  ***  Orders: No orders of the defined types were placed in this  encounter.  No orders of the defined types were placed in this encounter.    Follow-Up Instructions: No follow-ups on file.   Collier Salina, MD  Note - This record has been created using [employer].  Chart creation errors have been sought, but may not always  have been located. Such creation errors do not reflect on  the standard of medical care.

## 2021-12-14 ENCOUNTER — Encounter: Payer: Self-pay | Admitting: Internal Medicine

## 2021-12-14 ENCOUNTER — Ambulatory Visit (INDEPENDENT_AMBULATORY_CARE_PROVIDER_SITE_OTHER): Payer: Medicare Other | Admitting: Internal Medicine

## 2021-12-14 VITALS — BP 122/70 | HR 103 | Resp 16 | Ht 63.0 in

## 2021-12-14 DIAGNOSIS — M6282 Rhabdomyolysis: Secondary | ICD-10-CM

## 2021-12-14 DIAGNOSIS — M25562 Pain in left knee: Secondary | ICD-10-CM | POA: Diagnosis not present

## 2021-12-14 DIAGNOSIS — M7502 Adhesive capsulitis of left shoulder: Secondary | ICD-10-CM

## 2021-12-14 DIAGNOSIS — M25561 Pain in right knee: Secondary | ICD-10-CM | POA: Diagnosis not present

## 2021-12-14 DIAGNOSIS — M7501 Adhesive capsulitis of right shoulder: Secondary | ICD-10-CM

## 2021-12-14 DIAGNOSIS — G8929 Other chronic pain: Secondary | ICD-10-CM | POA: Diagnosis not present

## 2021-12-14 DIAGNOSIS — M7580 Other shoulder lesions, unspecified shoulder: Secondary | ICD-10-CM | POA: Diagnosis not present

## 2021-12-14 DIAGNOSIS — R768 Other specified abnormal immunological findings in serum: Secondary | ICD-10-CM

## 2021-12-14 MED ORDER — TRIAMCINOLONE ACETONIDE 40 MG/ML IJ SUSP
40.0000 mg | INTRAMUSCULAR | Status: AC | PRN
  Administered 2021-12-14: 40 mg via INTRA_ARTICULAR

## 2021-12-14 MED ORDER — LIDOCAINE HCL 1 % IJ SOLN
3.0000 mL | INTRAMUSCULAR | Status: AC | PRN
  Administered 2021-12-14: 3 mL

## 2021-12-15 LAB — CK: Total CK: 93 U/L (ref 29–143)

## 2022-01-26 ENCOUNTER — Ambulatory Visit: Payer: Medicare Other | Admitting: General Surgery

## 2022-07-13 ENCOUNTER — Ambulatory Visit: Payer: Medicare Other | Admitting: General Surgery

## 2022-07-14 ENCOUNTER — Ambulatory Visit: Payer: Medicare Other | Admitting: General Surgery

## 2022-07-14 ENCOUNTER — Encounter: Payer: Self-pay | Admitting: General Surgery

## 2022-07-14 VITALS — BP 139/73 | HR 84 | Temp 98.7°F | Resp 12 | Ht 63.0 in | Wt 120.0 lb

## 2022-07-14 DIAGNOSIS — Z931 Gastrostomy status: Secondary | ICD-10-CM | POA: Diagnosis not present

## 2022-07-14 NOTE — Patient Instructions (Addendum)
Will look into replacement tubes. Call with issues.   Someone will call you with my approximate estimation when this tube was placed and if we can get a replacement.

## 2022-07-16 NOTE — Progress Notes (Signed)
[facility]  Here for a G tube check. She was having issues and pain. This is now resolved she said.   Tolerating feeds. No issues with weight. Taking in minimally orally, really just for comfort.   BP 139/73   Pulse 84   Temp 98.7 F (37.1 C) (Oral)   Resp 12   Ht 5\' 3"  (1.6 m)   Wt 120 lb (54.4 kg)   SpO2 93%   BMI 21.26 kg/m   G tube in place, bumper back to 5cm, bumper placed back to about 3cm to fit more snug and keep the tube from moving back and forth which is likely causing the discomfort G tube currently in place is an 9 french [employer] with 49mL balloon, I am not sure when this was placed and daughter is unsure too    Patient with gastrostomy tube in place. She will likely need a replacement in a few months if this continues to work well.   I last replaced the tube 03/2021 and this was a 20 french. It appears it was replaced 08/2021 by ED and I am guessing this was the last time it was replaced. They do not document the size.   The daughter was wondering when it would need to be replaced to something different.   Typically these last about 1 year or so and then start to wear out.   I have discussed the 48 French replacement tube with the hospital staff that gets these ordered and supplied and will work to get her one so we can preemptively replace it.   Will have the office call the patient's daughter to let them know and plan accordingly.   Curlene Labrum, MD [facility] [address] [place], [place] [address] [phone] (office)

## 2022-07-21 DIAGNOSIS — E43 Unspecified severe protein-calorie malnutrition: Secondary | ICD-10-CM | POA: Diagnosis not present

## 2022-07-21 DIAGNOSIS — E8809 Other disorders of plasma-protein metabolism, not elsewhere classified: Secondary | ICD-10-CM | POA: Diagnosis not present

## 2022-07-21 DIAGNOSIS — E871 Hypo-osmolality and hyponatremia: Secondary | ICD-10-CM | POA: Diagnosis not present

## 2022-07-21 DIAGNOSIS — R634 Abnormal weight loss: Secondary | ICD-10-CM | POA: Diagnosis not present

## 2022-07-21 DIAGNOSIS — R1310 Dysphagia, unspecified: Secondary | ICD-10-CM | POA: Diagnosis not present

## 2022-07-23 IMAGING — XA IR CONVERT G-TUBE TO G-JTUBE
6 series · 6 of 6 positions shown · non-contrast
Comparison: none

INDICATION: 79-year-old female referred for conversion of surgical gastrostomy
to a gastrojejunostomy

[Series 1: fl neuro · 1 of 1 slices shown (1 of 6)]
[im 1/1]
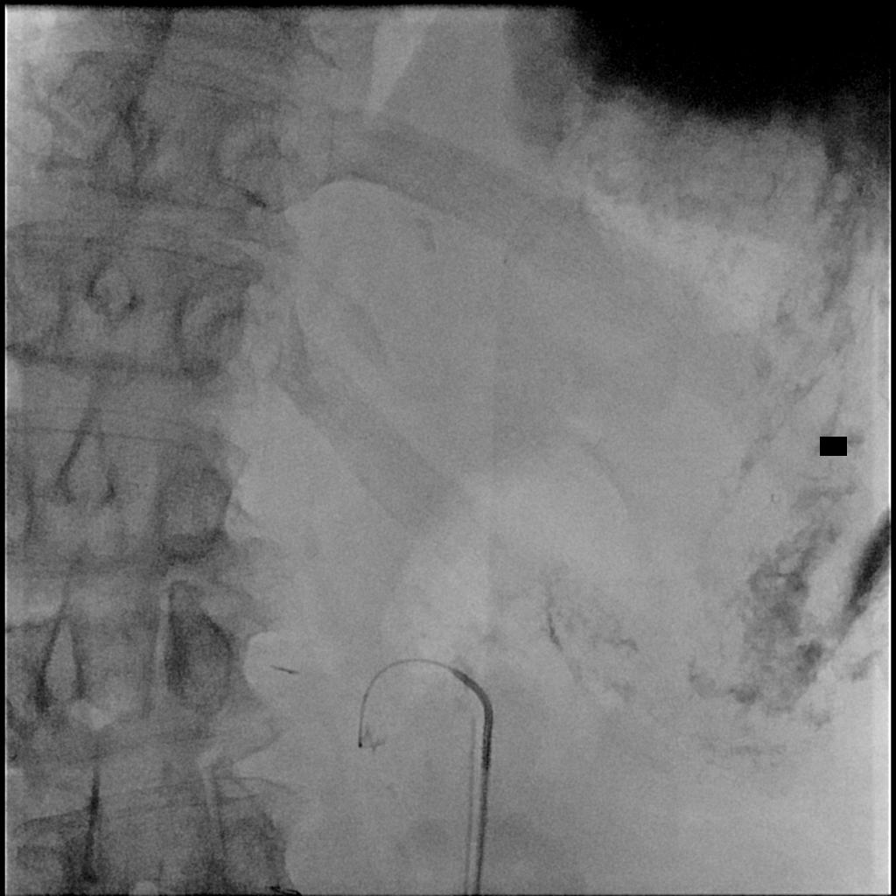

[Series 3: fl neuro · 1 of 1 slices shown (2 of 6)]
[im 1/1]
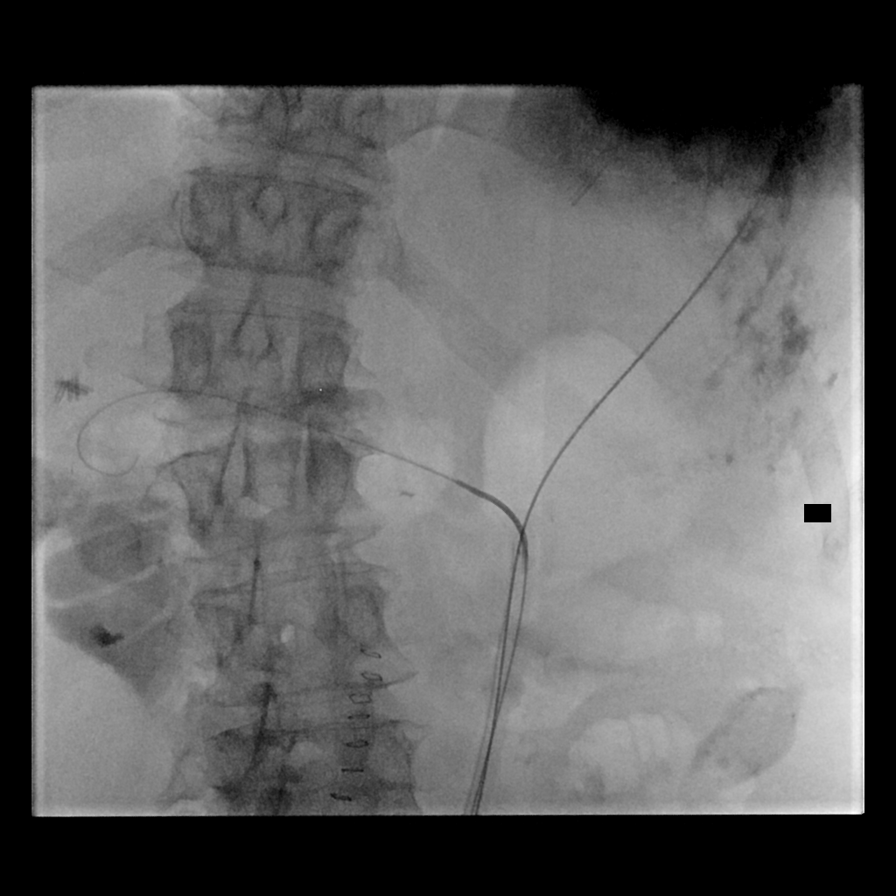

[Series 4: fl neuro · 1 of 1 slices shown (3 of 6)]
[im 1/1]
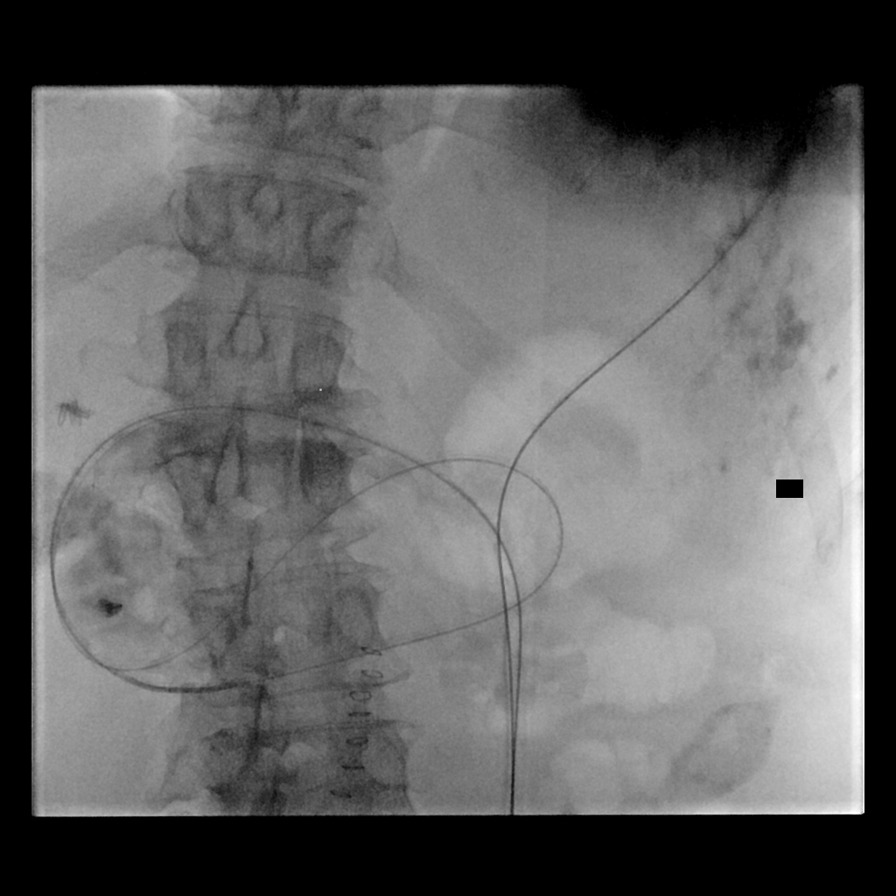

[Series 5: fl neuro · 1 of 1 slices shown (4 of 6)]
[im 1/1]
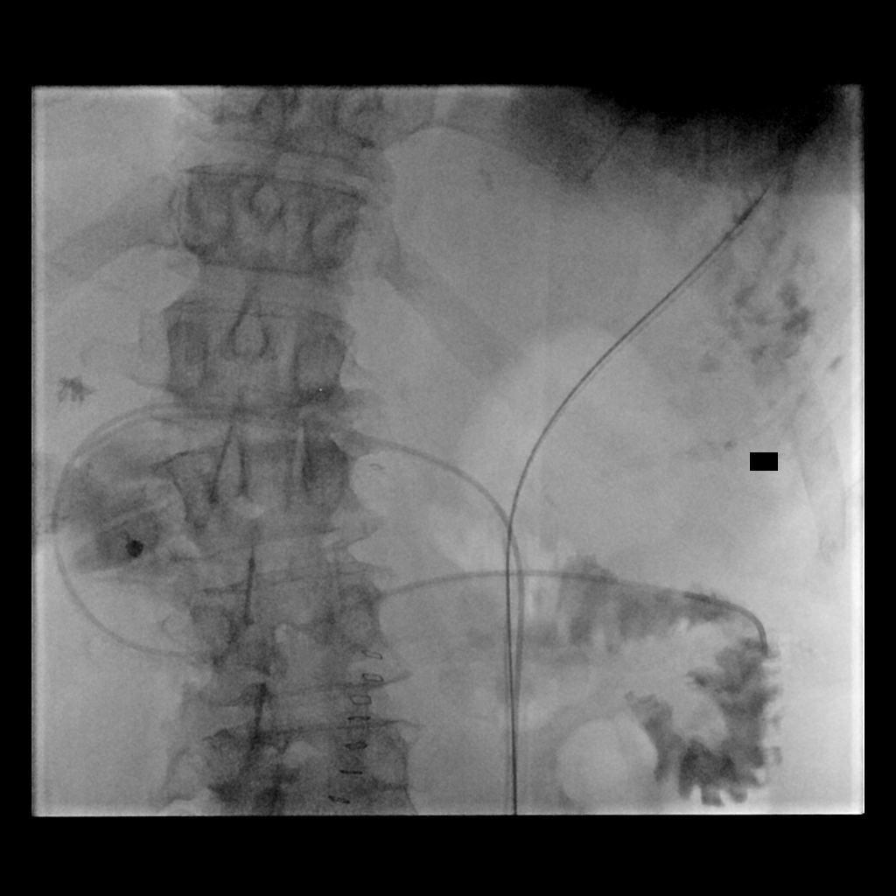

[Series 8: fl neuro · 1 of 1 slices shown (5 of 6)]
[im 1/1]
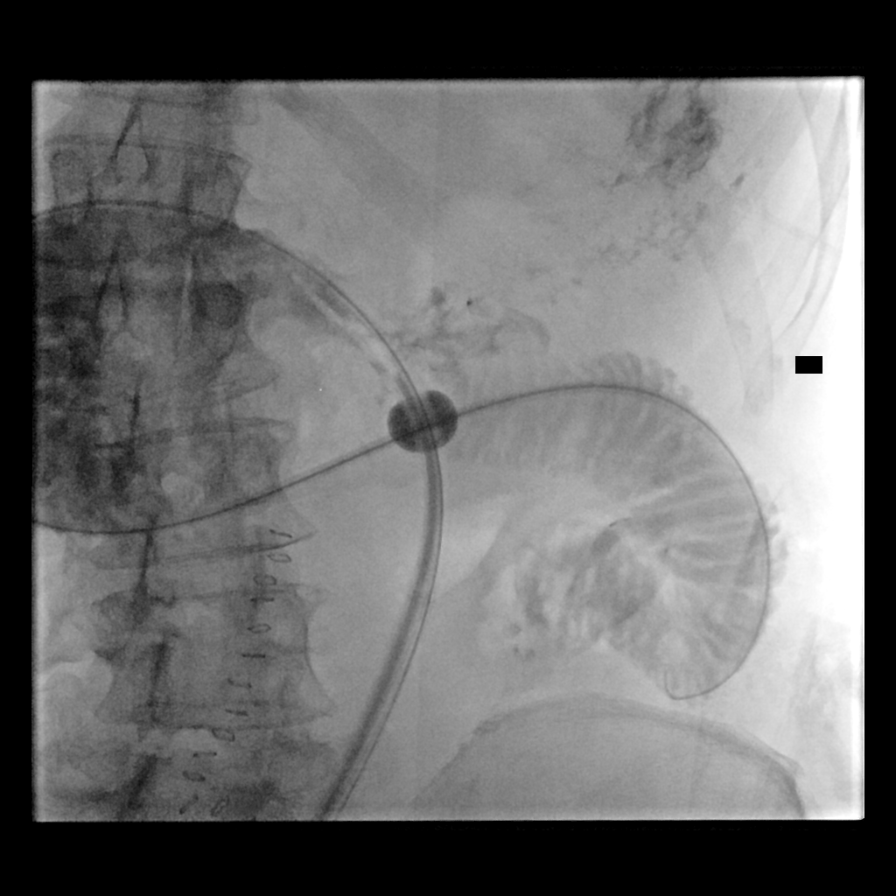

[Series 10: fl neuro · 1 of 1 slices shown (6 of 6)]
[im 1/1]
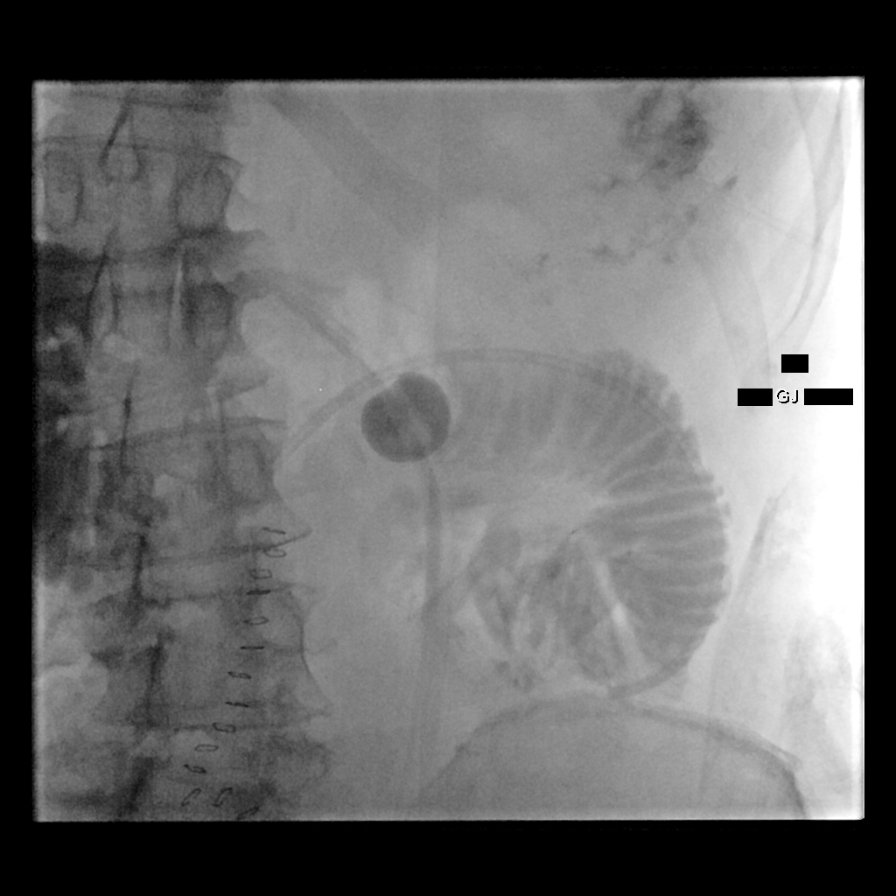

[6 of 6 positions shown; findings below may reference images not displayed]

Additional history of continuous leakage at the gastrostomy site.
The patient had a surgical gastrostomy placed, 24 French, which was
displaced by the patient. A repeat surgery with replacement of a 20
French gastrostomy, balloon retention, was performed.

EXAM:
CONVERT G-TUBE TO G-JTUBE

MEDICATIONS:
None

ANESTHESIA/SEDATION:
Versed 1.0 mg IV; Fentanyl 50 mcg IV

Moderate Sedation Time:  45 minutes

The patient was continuously monitored during the procedure by the
interventional radiology nurse under my direct supervision.

CONTRAST:  20 cc

FLUOROSCOPY TIME:  Fluoroscopy Time: 22 minutes 30 seconds (101
mGy).

COMPLICATIONS:
None

PROCEDURE:
Informed written consent was obtained from the patient and the
patient's family after a thorough discussion of the procedural
risks, benefits and alternatives. All questions were addressed.
Maximal Sterile Barrier Technique was utilized including caps, mask,
sterile gowns, sterile gloves, sterile drape, hand hygiene and skin
antiseptic. A timeout was performed prior to the initiation of the
procedure.

The epigastrium including the indwelling gastrostomy was prepped
with Betadine in a sterile fashion, and a sterile drape was applied
covering the operative field. A sterile gown and sterile gloves were
used for the procedure.

Contrast was infused to confirm location within the stomach. The
configuration of the contrast in the stomach suggest that this
gastrostomy is in the distal stomach, beyond the incisura, and near
the pylorus/pyloric channel.

A 65 cm Kumpe the catheter was advanced through the gastrostomy. A
Glidewire was used to probe for the pylorus, unsuccessful. A Cobra
catheter was then used with a Glidewire to probe for the
pylorus/duodenum. This was unsuccessful.

Kumpe the catheter was then used with a standard Glidewire, with a
circuitous route through the cardia of the stomach, looped back to
the body/pyloric channel, and ultimately through the duodenum. This
redundant route allow the catheter wire combination to go below the
balloon on the G-tube. This redundant catheter in wire configuration
was under favorable, and ultimately could not be reduced for a more
direct route through the pyloric channel.

At this point we placed a tandem Bentson wire into the stomach
lumen, cut the retention sutures at the skin, and deflated the
balloon, partly withdrawing on the gastrostomy. Kumpe the catheter
and the Cobra catheter were used to probe for the pylorus,
unsuccessful.

Ultimately, a 4 French omni Flush catheter was needed to direct the
standard Glidewire through the pyloric channel and into the
duodenum. The Omni Flush catheter was then advanced on the
Glidewire. With a Glidewire in the duodenum, the 4 French omni Flush
catheter was withdrawn and the Kumpe the catheter was advanced on
the Glidewire to the proximal jejunum. Wire was withdrawn and
contrast was infused confirming the location.

A 22 French gastrojejunostomy was selected. The final 15 cm was
amputated. The tube was then advanced on the Glidewire after
withdrawal of the Bentson wire.

Approximately 5 cc of mixed contrast and saline were used to inflate
the balloon.

Contrast was then injected confirming location within the stomach.

Patient tolerated the procedure well and remained hemodynamically
stable throughout.

No complications were encountered and no significant blood loss
encountered.
IMPRESSION: Exchange of surgical gastrostomy for a new gastrojejunostomy, as
above. The site of access for the surgical gastrostomy may not be
favorable for long-term effectiveness.

## 2022-07-29 ENCOUNTER — Encounter: Payer: Self-pay | Admitting: General Surgery

## 2022-07-29 ENCOUNTER — Ambulatory Visit (INDEPENDENT_AMBULATORY_CARE_PROVIDER_SITE_OTHER): Payer: Medicare Other | Admitting: General Surgery

## 2022-07-29 VITALS — BP 136/68 | HR 96 | Temp 97.9°F | Resp 12 | Ht 63.0 in | Wt 120.0 lb

## 2022-07-29 DIAGNOSIS — Z931 Gastrostomy status: Secondary | ICD-10-CM | POA: Diagnosis not present

## 2022-07-29 NOTE — Patient Instructions (Signed)
Flush tube as needed and you may want to flush with coke to clean out the tube to keep it from getting clogged up. Call with issues.

## 2022-07-29 NOTE — Progress Notes (Signed)
[facility] Procedure Note  07/29/22  Pre-procedure Diagnosis:  Gastrostomy tube in place, over 84 year old and needing replaced    Post-procedure Diagnosis: Same   Procedure(s) Performed: Exchange of gastrostomy tube [employer] 18 french, 20cc balloon replacement with Civil engineer, contracting)    Surgeon: Lanell Matar. Constance Haw, MD   Anesthesia: None     Estimated Blood Loss: None   Wound Class: Clean   Procedure Indications:  Ms. Ebers is a 82 yo with a permanent gastrostomy tube for feeding and her tube is getting older and is starting to have staining and her daughter is worried about it breaking due to the plastic getting old.  We discussed exchange and risk of not being able to exchange it or confirm it is in place, or needing additional procedures.   Findings: G tube removed and replaced, gastric contents pulled back into new tube and flush with water easily, no issues    Procedure: The patient was taken to the procedure room and placed upright in her wheelchair. The gastrostomy tube balloon was evacuated and only air and a little liquid was removed.  The tube was then pulled out and the balloon was still slightly inflated. There was some purple gastric contents (grape cough syrup per the patient) that leaked out some. The new 18 french gastrostomy tube was placed back into the channel without issues and the new balloon was filled with water to 20cc.  The gastrostomy tube was pulled back to fit snug at the skin (2 2cm mark at the skin), and the bolster was secured.  The luerlock syringe was used to pull back purple gastric contents and 10 cc of water flushed easily. The patient tolerated the procedure well.      Curlene Labrum, MD [facility] [address] [place], [place] [address] [phone] (office)

## 2022-08-11 ENCOUNTER — Inpatient Hospital Stay (HOSPITAL_COMMUNITY)
Admission: EM | Admit: 2022-08-11 | Discharge: 2022-08-16 | DRG: 862 | Disposition: A | Discharge status: Home Health | Payer: Medicare Other | Attending: General Surgery | Admitting: General Surgery

## 2022-08-11 ENCOUNTER — Telehealth: Payer: Self-pay | Admitting: *Deleted

## 2022-08-11 ENCOUNTER — Encounter (HOSPITAL_COMMUNITY): Payer: Self-pay

## 2022-08-11 ENCOUNTER — Other Ambulatory Visit: Payer: Self-pay

## 2022-08-11 ENCOUNTER — Emergency Department (HOSPITAL_COMMUNITY): Payer: Medicare Other

## 2022-08-11 DIAGNOSIS — K651 Peritoneal abscess: Secondary | ICD-10-CM | POA: Diagnosis not present

## 2022-08-11 DIAGNOSIS — Z888 Allergy status to other drugs, medicaments and biological substances status: Secondary | ICD-10-CM | POA: Diagnosis not present

## 2022-08-11 DIAGNOSIS — I10 Essential (primary) hypertension: Secondary | ICD-10-CM | POA: Diagnosis not present

## 2022-08-11 DIAGNOSIS — Z79899 Other long term (current) drug therapy: Secondary | ICD-10-CM | POA: Diagnosis not present

## 2022-08-11 DIAGNOSIS — Z825 Family history of asthma and other chronic lower respiratory diseases: Secondary | ICD-10-CM

## 2022-08-11 DIAGNOSIS — K219 Gastro-esophageal reflux disease without esophagitis: Secondary | ICD-10-CM | POA: Diagnosis not present

## 2022-08-11 DIAGNOSIS — E162 Hypoglycemia, unspecified: Secondary | ICD-10-CM | POA: Diagnosis not present

## 2022-08-11 DIAGNOSIS — Y828 Other medical devices associated with adverse incidents: Secondary | ICD-10-CM | POA: Diagnosis present

## 2022-08-11 DIAGNOSIS — E119 Type 2 diabetes mellitus without complications: Secondary | ICD-10-CM

## 2022-08-11 DIAGNOSIS — Z6821 Body mass index (BMI) 21.0-21.9, adult: Secondary | ICD-10-CM

## 2022-08-11 DIAGNOSIS — L02211 Cutaneous abscess of abdominal wall: Secondary | ICD-10-CM | POA: Diagnosis present

## 2022-08-11 DIAGNOSIS — E441 Mild protein-calorie malnutrition: Secondary | ICD-10-CM | POA: Diagnosis present

## 2022-08-11 DIAGNOSIS — R109 Unspecified abdominal pain: Secondary | ICD-10-CM | POA: Diagnosis not present

## 2022-08-11 DIAGNOSIS — R64 Cachexia: Secondary | ICD-10-CM | POA: Diagnosis not present

## 2022-08-11 DIAGNOSIS — I700 Atherosclerosis of aorta: Secondary | ICD-10-CM | POA: Diagnosis not present

## 2022-08-11 DIAGNOSIS — T8143XA Infection following a procedure, organ and space surgical site, initial encounter: Principal | ICD-10-CM | POA: Diagnosis present

## 2022-08-11 DIAGNOSIS — K6811 Postprocedural retroperitoneal abscess: Secondary | ICD-10-CM | POA: Diagnosis not present

## 2022-08-11 LAB — COMPREHENSIVE METABOLIC PANEL
ALT: 14 U/L (ref 0–44)
AST: 24 U/L (ref 15–41)
Albumin: 3.3 g/dL — ABNORMAL LOW (ref 3.5–5.0)
Alkaline Phosphatase: 74 U/L (ref 38–126)
Anion gap: 6 (ref 5–15)
BUN: 20 mg/dL (ref 8–23)
CO2: 27 mmol/L (ref 22–32)
Calcium: 9.2 mg/dL (ref 8.9–10.3)
Chloride: 98 mmol/L (ref 98–111)
Creatinine, Ser: 0.5 mg/dL (ref 0.44–1.00)
GFR, Estimated: >60 mL/min (ref >60)
Glucose, Bld: 108 mg/dL — ABNORMAL HIGH (ref 70–99)
Potassium: 4.4 mmol/L (ref 3.5–5.1)
Sodium: 131 mmol/L — ABNORMAL LOW (ref 135–145)
Total Bilirubin: 0.6 mg/dL (ref 0.3–1.2)
Total Protein: 8.1 g/dL (ref 6.5–8.1)

## 2022-08-11 LAB — CBC
HCT: 37.9 % (ref 36.0–46.0)
Hemoglobin: 12.5 g/dL (ref 12.0–15.0)
MCH: 32.1 pg (ref 26.0–34.0)
MCHC: 33 g/dL (ref 30.0–36.0)
MCV: 97.4 fL (ref 80.0–100.0)
Platelets: 201 10*3/uL (ref 150–400)
RBC: 3.89 MIL/uL (ref 3.87–5.11)
RDW: 13.8 % (ref 11.5–15.5)
WBC: 7 10*3/uL (ref 4.0–10.5)
nRBC: 0 % (ref 0.0–0.2)

## 2022-08-11 LAB — LIPASE, BLOOD: Lipase: 33 U/L (ref 11–51)

## 2022-08-11 MED ORDER — IOHEXOL 300 MG/ML  SOLN
100.0000 mL | Freq: Once | INTRAMUSCULAR | Status: AC | PRN
  Administered 2022-08-11: 100 mL via INTRAVENOUS

## 2022-08-11 MED ORDER — SODIUM CHLORIDE 0.9 % IV BOLUS
500.0000 mL | Freq: Once | INTRAVENOUS | Status: AC
  Administered 2022-08-11: 500 mL via INTRAVENOUS

## 2022-08-11 MED ORDER — FENTANYL CITRATE PF 50 MCG/ML IJ SOSY
50.0000 ug | PREFILLED_SYRINGE | Freq: Once | INTRAMUSCULAR | Status: AC
  Administered 2022-08-11: 50 ug via INTRAVENOUS
  Filled 2022-08-11: qty 1

## 2022-08-11 NOTE — ED Triage Notes (Signed)
Family reports pt feeding tube was replaced in the doctors office on 2/9 and has a red painful knot near the area.

## 2022-08-11 NOTE — ED Provider Notes (Signed)
[facility] Provider Note   CSN: [id number] Arrival date & time: 08/11/22  1856     History  Chief Complaint  Patient presents with   Abdominal Pain    Kathryn [name] is a 82 y.o. female.  HPI     82 year old female with history of gastrotomy tube, diabetes, hypertension comes in with chief complaint of abdominal pain.  Patient states that about 2 weeks ago her G-tube was replaced.  She has had pain since the procedure.  However more recently she started noticing increasing redness and drainage from the site of the surgery. Review of system is negative for any nausea, vomiting, fevers, chills.  Home Medications Prior to Admission medications   Medication Sig Start Date End Date Taking? Authorizing Provider  ACCU-CHEK AVIVA PLUS test strip 1 each 4 (four) times daily. 07/03/21   [provider]  acetaminophen (TYLENOL) 160 MG/5ML elixir Take 15.6 mLs (500 mg total) by mouth every 4 (four) hours as needed for fever or pain. 09/25/21   Virl Cagey, MD  diphenhydrAMINE (BENADRYL) 12.5 MG/5ML elixir Take 12.5 mg by mouth daily as needed.    [provider]  hydroxypropyl methylcellulose / hypromellose (ISOPTO TEARS / GONIOVISC) 2.5 % ophthalmic solution Place 1 drop into both eyes 4 (four) times daily as needed for dry eyes. 04/28/21   [name], MD  Nutritional Supplements (FEEDING SUPPLEMENT, OSMOLITE 1.2 CAL,) LIQD Place 1,000 mLs into feeding tube daily. Tube feeding to run at 24m/hr from 8pm to 8am, increase rate by 182mhr every 24 hours until goal of 10029mr is reached. Patient taking differently: Place 1,000 mLs into feeding tube daily. Tube feeding to run at 1000m82m from 8pm to 8am 04/27/21   MemoKathie Dike  pantoprazole (PROTONIX) 40 MG tablet Take 1 tablet (40 mg total) by mouth daily. Crush pill and place through feeding tube as educated to prior, flush feeding tube after pill is given 08/25/21    BridVirl Cagey  Water For Irrigation, Sterile (FREE WATER) SOLN Place 280 mLs into feeding tube in the morning, at noon, and at bedtime. 04/27/21   MemoKathie Dike      Allergies    Cyclobenzaprine hcl    Review of Systems   Review of Systems  All other systems reviewed and are negative.   Physical Exam Updated Vital Signs BP (!) 144/66   Pulse 91   Temp 98.4 F (36.9 C) (Temporal)   Resp 19   Ht 5' 3"$  (1.6 m)   Wt 54.4 kg   SpO2 98%   BMI 21.26 kg/m  Physical Exam Vitals and nursing note reviewed.  Constitutional:      Appearance: She is well-developed.  HENT:     Head: Atraumatic.  Cardiovascular:     Rate and Rhythm: Normal rate.  Pulmonary:     Effort: Pulmonary effort is normal.  Abdominal:     Tenderness: There is abdominal tenderness.     Comments: Patient has erythema surrounding the G-tube.  There is also purulent drainage.  Musculoskeletal:     Cervical back: Normal range of motion and neck supple.  Skin:    General: Skin is warm and dry.  Neurological:     Mental Status: She is alert and oriented to person, place, and time.     ED Results / Procedures / Treatments   Labs (all labs ordered are listed, but only abnormal results are displayed) Labs Reviewed  COMPREHENSIVE  METABOLIC PANEL - Abnormal; Notable for the following components:      Result Value   Sodium 131 (*)    Glucose, Bld 108 (*)    Albumin 3.3 (*)    All other components within normal limits  AEROBIC/ANAEROBIC CULTURE W GRAM STAIN (SURGICAL/DEEP WOUND)  CULTURE, BLOOD (ROUTINE X 2)  CULTURE, BLOOD (ROUTINE X 2)  LIPASE, BLOOD  CBC  URINALYSIS, ROUTINE W REFLEX MICROSCOPIC    EKG EKG Interpretation  Date/Time:  Wednesday August 11 2022 20:20:57 EST Ventricular Rate:  88 PR Interval:  138 QRS Duration: 131 QT Interval:  389 QTC Calculation: 471 R Axis:   77 Text Interpretation: Sinus rhythm Right bundle branch block ST elevation, consider inferior injury No  acute changes Confirmed by Varney Biles [id number]) on 08/11/2022 9:42:28 PM  Radiology CT ABDOMEN PELVIS W CONTRAST  Result Date: 08/11/2022 CLINICAL DATA:  G-tube placement recently with increased pain and purulent drainage. Abdominal pain. EXAM: CT ABDOMEN AND PELVIS WITH CONTRAST TECHNIQUE: Multidetector CT imaging of the abdomen and pelvis was performed using the standard protocol following bolus administration of intravenous contrast. RADIATION DOSE REDUCTION: This exam was performed according to the departmental dose-optimization program which includes automated exposure control, adjustment of the mA and/or kV according to patient size and/or use of iterative reconstruction technique. CONTRAST:  125m OMNIPAQUE IOHEXOL 300 MG/ML  SOLN COMPARISON:  CT abdomen and pelvis 12/25/2020 FINDINGS: Lower chest: No acute abnormality. Hepatobiliary: No focal liver abnormality is seen. Status post cholecystectomy. No biliary dilatation. Pancreas: Unremarkable. No pancreatic ductal dilatation or surrounding inflammatory changes. Spleen: Normal in size without focal abnormality. Adrenals/Urinary Tract: There are numerous bilateral renal cysts measuring 13 mm or less. There is no hydronephrosis or perinephric fat stranding. Adrenal glands and bladder are within normal limits. Stomach/Bowel: Percutaneous gastrostomy tube in place in the body of the stomach. Stomach is nondilated. There is mild fat stranding surrounding the catheter within the abdominal wall. There is a small air-fluid level abutting the left side of the catheter best seen on delayed image 7/28 measuring 8 x 13 by 12 mm. There is no evidence for bowel obstruction. Appendix is within normal limits. There are bowel anastomoses in the lower abdomen. Vascular/Lymphatic: Aortic atherosclerosis. No enlarged abdominal or pelvic lymph nodes. Reproductive: Status post hysterectomy. No adnexal masses. Other: No abdominal wall hernia or abnormality. No abdominopelvic  ascites. Musculoskeletal: The bones are osteopenic. Degenerative changes affect the spine. IMPRESSION: 1. Percutaneous gastrostomy tube in place in the body of the stomach. There is a small air-fluid level abutting the left side of the catheter measuring 8 x 13 x 12 mm. Findings are concerning for infection/abscess. 2. No evidence for bowel obstruction. 3. Bilateral renal cysts. No follow-up needed. Aortic Atherosclerosis (ICD10-I70.0). Electronically Signed   By: ARonney AstersM.D.   On: 08/11/2022 23:11    Procedures .Critical Care  Performed by: NVarney Biles MD Authorized by: NVarney Biles MD   Critical care provider statement:    Critical care time (minutes):  32   Critical care was necessary to treat or prevent imminent or life-threatening deterioration of the following conditions: Intra-abdominal infection/abscess.   Critical care was time spent personally by me on the following activities:  Development of treatment plan with patient or surrogate, discussions with consultants, evaluation of patient's response to treatment, examination of patient, ordering and review of laboratory studies, ordering and review of radiographic studies, ordering and performing treatments and interventions, pulse oximetry, re-evaluation of patient's condition and review of old  charts     Medications Ordered in ED Medications  fentaNYL (SUBLIMAZE) injection 50 mcg (50 mcg Intravenous Given 08/11/22 2239)  sodium chloride 0.9 % bolus 500 mL (500 mLs Intravenous New Bag/Given 08/11/22 2241)  iohexol (OMNIPAQUE) 300 MG/ML solution 100 mL (100 mLs Intravenous Contrast Given 08/11/22 2251)    ED Course/ Medical Decision Making/ A&P                             Medical Decision Making Pt comes in w/ cc of abd pain. Hx of G tube placement.  On exam, there is clear evidence of abscess, cellulitis over the abdominal wall around the G-tube insertion site.  Abdomen is otherwise soft.  We will need to get CT  scan of the abdomen to evaluate for abscess. Clinically patient is not septic.  12:02 AM CT scan reveals evidence of abscess.  Consulted general surgery, Dr. Constance Haw.  She will see the patient.  She is requesting that patient be admitted by medicine service for now.  They will assess to see if the abscess needs to be drained.  Patient made aware of the diagnosis.  Amount and/or Complexity of Data Reviewed Labs: ordered. Radiology: ordered.  Risk Prescription drug management. Decision regarding hospitalization.     Final Clinical Impression(s) / ED Diagnoses Final diagnoses:  Postprocedural intraabdominal abscess    Rx / DC Orders ED Discharge Orders     None         Varney Biles, MD 08/12/22 0003

## 2022-08-11 NOTE — Telephone Encounter (Signed)
Procedure Date: 07/29/2022 Procedure: G-Tube Change  Received call from patient daughter, Rannie [phone] telephone.   Reports that patient is voicing C/O internal abdominal pain at tube insertion site. States that she did not have any pain with prior tube.   Of note, Rannie did states that patient had some irritation on surrounding outer skin. States that she used Peroxide and Hydrocortisone cream on irritation and it has resolved.   Reports that tube flushes well, has no difficulty with feeding, and is able to reflux easily. No other concerns present except for internal pain at insertion site.   Daughter is concerned about tube and would like provider to assess. Appointment scheduled.

## 2022-08-12 DIAGNOSIS — E162 Hypoglycemia, unspecified: Secondary | ICD-10-CM | POA: Diagnosis not present

## 2022-08-12 DIAGNOSIS — E441 Mild protein-calorie malnutrition: Secondary | ICD-10-CM | POA: Diagnosis present

## 2022-08-12 DIAGNOSIS — Z825 Family history of asthma and other chronic lower respiratory diseases: Secondary | ICD-10-CM | POA: Diagnosis not present

## 2022-08-12 DIAGNOSIS — L02211 Cutaneous abscess of abdominal wall: Secondary | ICD-10-CM | POA: Diagnosis not present

## 2022-08-12 DIAGNOSIS — Z888 Allergy status to other drugs, medicaments and biological substances status: Secondary | ICD-10-CM | POA: Diagnosis not present

## 2022-08-12 DIAGNOSIS — I10 Essential (primary) hypertension: Secondary | ICD-10-CM | POA: Diagnosis present

## 2022-08-12 DIAGNOSIS — E119 Type 2 diabetes mellitus without complications: Secondary | ICD-10-CM

## 2022-08-12 DIAGNOSIS — K651 Peritoneal abscess: Secondary | ICD-10-CM | POA: Diagnosis present

## 2022-08-12 DIAGNOSIS — K219 Gastro-esophageal reflux disease without esophagitis: Secondary | ICD-10-CM | POA: Diagnosis not present

## 2022-08-12 DIAGNOSIS — Z6821 Body mass index (BMI) 21.0-21.9, adult: Secondary | ICD-10-CM | POA: Diagnosis not present

## 2022-08-12 DIAGNOSIS — Z79899 Other long term (current) drug therapy: Secondary | ICD-10-CM | POA: Diagnosis not present

## 2022-08-12 DIAGNOSIS — T8143XA Infection following a procedure, organ and space surgical site, initial encounter: Secondary | ICD-10-CM | POA: Diagnosis present

## 2022-08-12 DIAGNOSIS — Y828 Other medical devices associated with adverse incidents: Secondary | ICD-10-CM | POA: Diagnosis present

## 2022-08-12 DIAGNOSIS — R64 Cachexia: Secondary | ICD-10-CM | POA: Diagnosis present

## 2022-08-12 LAB — URINALYSIS, ROUTINE W REFLEX MICROSCOPIC
Bilirubin Urine: NEGATIVE
Glucose, UA: NEGATIVE mg/dL
Hgb urine dipstick: NEGATIVE
Ketones, ur: NEGATIVE mg/dL
Leukocytes,Ua: NEGATIVE
Nitrite: NEGATIVE
Protein, ur: NEGATIVE mg/dL
Specific Gravity, Urine: 1.01 (ref 1.005–1.030)
pH: 7 (ref 5.0–8.0)

## 2022-08-12 LAB — COMPREHENSIVE METABOLIC PANEL
ALT: 14 U/L (ref 0–44)
AST: 24 U/L (ref 15–41)
Albumin: 3.2 g/dL — ABNORMAL LOW (ref 3.5–5.0)
Alkaline Phosphatase: 76 U/L (ref 38–126)
Anion gap: 7 (ref 5–15)
BUN: 14 mg/dL (ref 8–23)
CO2: 25 mmol/L (ref 22–32)
Calcium: 9 mg/dL (ref 8.9–10.3)
Chloride: 100 mmol/L (ref 98–111)
Creatinine, Ser: 0.49 mg/dL (ref 0.44–1.00)
GFR, Estimated: >60 mL/min (ref >60)
Glucose, Bld: 112 mg/dL — ABNORMAL HIGH (ref 70–99)
Potassium: 4.2 mmol/L (ref 3.5–5.1)
Sodium: 132 mmol/L — ABNORMAL LOW (ref 135–145)
Total Bilirubin: 0.7 mg/dL (ref 0.3–1.2)
Total Protein: 8 g/dL (ref 6.5–8.1)

## 2022-08-12 LAB — MAGNESIUM: Magnesium: 2 mg/dL (ref 1.7–2.4)

## 2022-08-12 LAB — CBC WITH DIFFERENTIAL/PLATELET
Abs Immature Granulocytes: 0.02 10*3/uL (ref 0.00–0.07)
Basophils Absolute: 0 10*3/uL (ref 0.0–0.1)
Basophils Relative: 0 %
Eosinophils Absolute: 0 10*3/uL (ref 0.0–0.5)
Eosinophils Relative: 0 %
HCT: 39 % (ref 36.0–46.0)
Hemoglobin: 13 g/dL (ref 12.0–15.0)
Immature Granulocytes: 0 %
Lymphocytes Relative: 15 %
Lymphs Abs: 0.9 10*3/uL (ref 0.7–4.0)
MCH: 32.6 pg (ref 26.0–34.0)
MCHC: 33.3 g/dL (ref 30.0–36.0)
MCV: 97.7 fL (ref 80.0–100.0)
Monocytes Absolute: 0.5 10*3/uL (ref 0.1–1.0)
Monocytes Relative: 7 %
Neutro Abs: 4.9 10*3/uL (ref 1.7–7.7)
Neutrophils Relative %: 78 %
Platelets: 199 10*3/uL (ref 150–400)
RBC: 3.99 MIL/uL (ref 3.87–5.11)
RDW: 13.6 % (ref 11.5–15.5)
WBC: 6.3 10*3/uL (ref 4.0–10.5)
nRBC: 0 % (ref 0.0–0.2)

## 2022-08-12 LAB — GLUCOSE, CAPILLARY
Glucose-Capillary: 101 mg/dL — ABNORMAL HIGH (ref 70–99)
Glucose-Capillary: 136 mg/dL — ABNORMAL HIGH (ref 70–99)
Glucose-Capillary: 145 mg/dL — ABNORMAL HIGH (ref 70–99)
Glucose-Capillary: 66 mg/dL — ABNORMAL LOW (ref 70–99)
Glucose-Capillary: 74 mg/dL (ref 70–99)

## 2022-08-12 MED ORDER — ACETAMINOPHEN 650 MG RE SUPP: 650.0000 mg | Freq: Four times a day (QID) | RECTAL | Status: DC | PRN

## 2022-08-12 MED ORDER — ONDANSETRON HCL 4 MG/2ML IJ SOLN: 4.0000 mg | Freq: Four times a day (QID) | INTRAMUSCULAR | Status: DC | PRN

## 2022-08-12 MED ORDER — FREE WATER
100.0000 mL | Status: DC
  Administered 2022-08-12 – 2022-08-16 (×24): 100 mL

## 2022-08-12 MED ORDER — VANCOMYCIN HCL 750 MG/150ML IV SOLN
750.0000 mg | INTRAVENOUS | Status: DC
  Administered 2022-08-12 – 2022-08-15 (×4): 750 mg via INTRAVENOUS
  Filled 2022-08-12 (×4): qty 150

## 2022-08-12 MED ORDER — MORPHINE SULFATE (PF) 2 MG/ML IV SOLN
2.0000 mg | INTRAVENOUS | Status: DC | PRN
  Administered 2022-08-12 – 2022-08-14 (×5): 2 mg via INTRAVENOUS
  Filled 2022-08-12 (×5): qty 1

## 2022-08-12 MED ORDER — OXYCODONE HCL 5 MG PO TABS: 5.0000 mg | ORAL_TABLET | ORAL | Status: DC | PRN

## 2022-08-12 MED ORDER — ORAL CARE MOUTH RINSE: 15.0000 mL | OROMUCOSAL | Status: DC | PRN

## 2022-08-12 MED ORDER — OSMOLITE 1.5 CAL PO LIQD
1000.0000 mL | ORAL | Status: DC
  Administered 2022-08-12 – 2022-08-15 (×2): 1000 mL

## 2022-08-12 MED ORDER — VANCOMYCIN HCL IN DEXTROSE 1-5 GM/200ML-% IV SOLN
1000.0000 mg | Freq: Once | INTRAVENOUS | Status: AC
  Administered 2022-08-12: 1000 mg via INTRAVENOUS
  Filled 2022-08-12: qty 200

## 2022-08-12 MED ORDER — ACETAMINOPHEN 325 MG PO TABS: 650.0000 mg | ORAL_TABLET | Freq: Four times a day (QID) | ORAL | Status: DC | PRN

## 2022-08-12 MED ORDER — PIPERACILLIN-TAZOBACTAM 3.375 G IVPB
3.3750 g | Freq: Three times a day (TID) | INTRAVENOUS | Status: DC
  Administered 2022-08-12 – 2022-08-16 (×13): 3.375 g via INTRAVENOUS
  Filled 2022-08-12 (×14): qty 50

## 2022-08-12 MED ORDER — DEXTROSE 50 % IV SOLN
12.5000 g | INTRAVENOUS | Status: AC
  Administered 2022-08-12: 12.5 g via INTRAVENOUS
  Filled 2022-08-12: qty 50

## 2022-08-12 MED ORDER — OSMOLITE 1.2 CAL PO LIQD
1000.0000 mL | ORAL | Status: DC
  Filled 2022-08-12: qty 1000

## 2022-08-12 MED ORDER — PANTOPRAZOLE SODIUM 40 MG IV SOLR
40.0000 mg | INTRAVENOUS | Status: DC
  Administered 2022-08-12 – 2022-08-16 (×5): 40 mg via INTRAVENOUS
  Filled 2022-08-12 (×4): qty 10

## 2022-08-12 MED ORDER — HEPARIN SODIUM (PORCINE) 5000 UNIT/ML IJ SOLN
5000.0000 [IU] | Freq: Three times a day (TID) | INTRAMUSCULAR | Status: DC
  Administered 2022-08-12 – 2022-08-16 (×13): 5000 [IU] via SUBCUTANEOUS
  Filled 2022-08-12 (×13): qty 1

## 2022-08-12 MED ORDER — SODIUM CHLORIDE 0.9 % IV SOLN: INTRAVENOUS | Status: DC

## 2022-08-12 MED ORDER — ORAL CARE MOUTH RINSE
15.0000 mL | OROMUCOSAL | Status: DC
  Administered 2022-08-12 – 2022-08-16 (×16): 15 mL via OROMUCOSAL

## 2022-08-12 MED ORDER — OXYCODONE HCL 5 MG PO TABS
5.0000 mg | ORAL_TABLET | ORAL | Status: DC | PRN
  Administered 2022-08-15: 5 mg
  Filled 2022-08-12: qty 1

## 2022-08-12 MED ORDER — PIPERACILLIN-TAZOBACTAM 3.375 G IVPB 30 MIN
3.3750 g | Freq: Once | INTRAVENOUS | Status: AC
  Administered 2022-08-12: 3.375 g via INTRAVENOUS
  Filled 2022-08-12: qty 50

## 2022-08-12 MED ORDER — DIPHENHYDRAMINE HCL 12.5 MG/5ML PO ELIX: 12.5000 mg | ORAL_SOLUTION | Freq: Every day | ORAL | Status: DC | PRN

## 2022-08-12 MED ORDER — ONDANSETRON HCL 4 MG PO TABS: 4.0000 mg | ORAL_TABLET | Freq: Four times a day (QID) | ORAL | Status: DC | PRN

## 2022-08-12 NOTE — Progress Notes (Signed)
[facility]  Patient CT reviewed and small area of purulence on CT. Tube in place. I removed the water from the balloon and partially removed the tube to see if more purulence would drain and it did. Induration and pain around the site.  Draining now.  BP 129/77 (BP Location: Right Arm)   Pulse 86   Temp 98.7 F (37.1 C) (Oral)   Resp 20   Ht 5' 3"$  (1.6 m)   Wt 54.4 kg   SpO2 100%   BMI 21.26 kg/m  Tube resecured and balloon filled back with 20cc of sterile water  Flushed easily without pain  Patient with a local abdominal wall abscess at the site of the G tube entry. This is draining. No I&D needed right now. IV antibiotics Continue feeds, she does osmolite 27m 8pm8am per the daughter at home I reordered feeds and nutrition to reassess  Clear diet so she can sip as she tolerates, she does minimal at home  Discussed with daughter, patient and  team.   LCurlene Labrum MD [address]SIgnacia MarvelRTuckahoe [place] [phone](office)

## 2022-08-12 NOTE — Care Management Important Message (Signed)
Important Message  Patient Details  Name: Kathryn [name] MRN: [id number] Date of Birth: [birth year]   Medicare Important Message Given:  N/A - LOS <3 / Initial given by admissions     Tommy Medal 08/12/2022, 10:44 AM

## 2022-08-12 NOTE — Progress Notes (Signed)
Hypoglycemic Event  CBG: 66 mg/dL  Treatment: D50 25 mL (12.5 gm)  Symptoms: None  Follow-up CBG: Time:1713 CBG Result:136 mg/dL  Possible Reasons for Event: Inadequate meal intake  Comments/MD notified: [name]. [name] [name] Patient given D50 25 mL IV. Rechecked blood glucose was 136. MD Dyann Kief made aware.    Kathryn [name]

## 2022-08-12 NOTE — H&P (Signed)
History and Physical    Patient: Kathryn [name] DOB: 1941-05-08 DOA: 08/11/2022 DOS: the patient was seen and examined on 08/12/2022 PCP: Luciano Cutter, DO  Patient coming from: Home  Chief Complaint:  Chief Complaint  Patient presents with   Abdominal Pain   HPI: Kathryn [name] is a 82 y.o. female with medical history significant of G-tube secondary to achalasia, GERD, diabetes, hypertension, presents to the ER with a chief complaint of abdominal pain.  She reports the abdominal pain started 2-3 days ago.  Its been worse since it started.  She describes it as severe and sharp.  It is no worse with intake per tube.  It is no worse when she has a bowel movement.  Pain is constant throughout the day.  She denies any fever, nausea, vomiting, diarrhea.  Patient denies any chest pain, dyspnea.  She reports she has seen drainage from around her G-tube that is yellow and malodorous.  Patient denies any blood in her stool or melena.  Patient has no other complaints at this time.  Patient does not smoke, does not drink, does not use illicit drugs.  She is vaccinated for COVID.  Patient is full code.  Review of Systems: As mentioned in the history of present illness. All other systems reviewed and are negative. Past Medical History:  Diagnosis Date   Acid reflux    Diabetes ([place])    controlled with diet.    HTN (hypertension)    Past Surgical History:  Procedure Laterality Date   ABDOMINAL HYSTERECTOMY     BOTOX INJECTION N/A 01/15/2021   Procedure: BOTOX INJECTION;  Surgeon: Eloise Harman, DO;  [facility];  Service: Endoscopy;  Laterality: N/A;   BOWEL RESECTION N/A 01/22/2021   Procedure: SMALL BOWEL RESECTION;  Surgeon: Virl Cagey, MD;  Location: [facility];  Service: General;  Laterality: N/A;   CHOLECYSTECTOMY     ESOPHAGEAL DILATION N/A 10/24/2020   Procedure: ESOPHAGEAL DILATION;  Surgeon: Daneil Dolin, MD;  [facility]: [facility];  Service:  Endoscopy;  Laterality: N/A;   ESOPHAGOGASTRODUODENOSCOPY N/A 10/24/2020   Procedure: ESOPHAGOGASTRODUODENOSCOPY (EGD);  Surgeon: Daneil Dolin, MD;  [facility];  Service: Endoscopy;  Laterality: N/A;   ESOPHAGOGASTRODUODENOSCOPY (EGD) WITH PROPOFOL N/A 01/15/2021   Procedure: ESOPHAGOGASTRODUODENOSCOPY (EGD) WITH PROPOFOL;  Surgeon: Eloise Harman, DO;  [facility];  Service: Endoscopy;  Laterality: N/A;   GASTROSTOMY N/A 01/23/2021   Procedure: REINSERTION OF GASTROSTOMY TUBE;  Surgeon: Virl Cagey, MD;  Location: [facility];  Service: General;  Laterality: N/A;   GASTROSTOMY N/A 01/22/2021   Procedure: INSERTION OF GASTROSTOMY TUBE;  Surgeon: Virl Cagey, MD;  Location: [facility];  Service: General;  Laterality: N/A;   Histosalpingogram     IR GASTR TUBE CONVERT GASTR-JEJ PER W/FL MOD SED  02/02/2021   IR [place] TUBE CHANGE  03/04/2021   IR [place] TUBE CHANGE  03/23/2021   LYSIS OF ADHESION  01/22/2021   Procedure: LYSIS OF ADHESION;  Surgeon: Virl Cagey, MD;  Location: [facility];  Service: General;;   Social History:  reports that she has never smoked. She has never used smokeless tobacco. She reports that she does not drink alcohol and does not use drugs.  Allergies  Allergen Reactions   Cyclobenzaprine Hcl Swelling    Family History  Problem Relation Age of Onset   Colon cancer Father        diagnosed in his late  53s.   COPD Son    Esophageal cancer Neg Hx    Stomach cancer Neg Hx     Prior to Admission medications   Medication Sig Start Date End Date Taking? Authorizing Provider  ACCU-CHEK AVIVA PLUS test strip 1 each 4 (four) times daily. 07/03/21   [provider]  acetaminophen (TYLENOL) 160 MG/5ML elixir Take 15.6 mLs (500 mg total) by mouth every 4 (four) hours as needed for fever or pain. 09/25/21   Virl Cagey, MD  diphenhydrAMINE (BENADRYL) 12.5 MG/5ML elixir Take 12.5 mg by mouth daily as needed.    [provider]   hydroxypropyl methylcellulose / hypromellose (ISOPTO TEARS / GONIOVISC) 2.5 % ophthalmic solution Place 1 drop into both eyes 4 (four) times daily as needed for dry eyes. 04/28/21   [name], MD  Nutritional Supplements (FEEDING SUPPLEMENT, OSMOLITE 1.2 CAL,) LIQD Place 1,000 mLs into feeding tube daily. Tube feeding to run at 88m/hr from 8pm to 8am, increase rate by 127mhr every 24 hours until goal of 10052mr is reached. Patient taking differently: Place 1,000 mLs into feeding tube daily. Tube feeding to run at 1000m67m from 8pm to 8am 04/27/21   MemoKathie Dike  pantoprazole (PROTONIX) 40 MG tablet Take 1 tablet (40 mg total) by mouth daily. Crush pill and place through feeding tube as educated to prior, flush feeding tube after pill is given 08/25/21   BridVirl Cagey  Water For Irrigation, Sterile (FREE WATER) SOLN Place 280 mLs into feeding tube in the morning, at noon, and at bedtime. 04/27/21   MemoKathie Dike    Physical Exam: Vitals:   08/11/22 1915 08/11/22 2020 08/11/22 2200 08/12/22 0035  BP:  (!) 149/70 (!) 144/66 (!) 143/83  Pulse:  89 91 (!) 101  Resp:  16 19 15  $ Temp:    98 F (36.7 C)  TempSrc:      SpO2:  98% 98% 98%  Weight: 54.4 kg     Height: 5' 3"$  (1.6 m)      1.  General: Patient lying supine in bed,  no acute distress   2. Psychiatric: Alert and oriented x 3, mood and behavior normal for situation, pleasant and cooperative with exam   3. Neurologic: Speech sounds like hot potato voice, but this is normal for her, language is normal, face is symmetric, moves all 4 extremities voluntarily, at baseline without acute deficits on limited exam   4. HEENMT:  Head is atraumatic, normocephalic, pupils reactive to light, neck is supple, trachea is midline, mucous membranes are moist   5. Respiratory : Lungs are clear to auscultation bilaterally without wheezing, rhonchi, rales, no cyanosis, no increase in work of breathing or accessory muscle  use   6. Cardiovascular : Heart rate normal, rhythm is regular, + murmur, no rubs or gallops, no peripheral edema, peripheral pulses palpated   7. Gastrointestinal:  Abdomen is soft, nondistended, nontender to palpation bowel sounds active, no masses or organomegaly palpated   8. Skin:  Clean, Dry and intact bandage around G tube   9.Musculoskeletal:  No acute deformities or trauma, no asymmetry in tone, no peripheral edema, peripheral pulses palpated, no tenderness to palpation in the extremities  Data Reviewed In the ED Temp 98.4, heart rate 89-95, respiratory rate 16-19, blood pressure 136/66-149/77, satting at 98% No leukocytosis with white blood cell count of 7.0, hemoglobin 12.5, platelets 201 Chemistry is unremarkable Wound culture pending CT abdomen shows gastrostomy tube in place in body  of stomach with small area air-fluid level abutting the left side of the catheter that is concerning for abscess No evidence of bowel obstruction on CT EKG shows a heart rate of 88, sinus rhythm, QTc 471 Patient was started on Zosyn and vancomycin NS 500 mL given in ED Admission requested for further management of intra-abdominal abscess   Assessment and Plan: * Intra-abdominal abscess ([place]) - Replace G-tube at the beginning of this month - Now with increasing pain, erythema, purulent drainage from G-tube site - CT scan confirms intra-abdominal abscess - General surgery consulted and recommends broad-spectrum antibiotics, she will evaluate in the a.m. and decide if IR needs to be involved - Continue pain control - Continue to monitor  GERD (gastroesophageal reflux disease) - Continue Protonix  Type 2 diabetes mellitus ([facility]) - Currently diet controlled - Glucose 108 in the ER - Continue to monitor with routine labs  Mild protein-calorie malnutrition ([facility]) - Albumin 3.3 - Patient appears cachectic - Normal BMI - Will need to resume tube feeds after evaluation by general surgery  and IR      Advance Care Planning:   Code Status: Full Code   Consults: Gen surg  Family Communication: No family at bedside  Severity of Illness: The appropriate patient status for this patient is INPATIENT. Inpatient status is judged to be reasonable and necessary in order to provide the required intensity of service to ensure the patient's safety. The patient's presenting symptoms, physical exam findings, and initial radiographic and laboratory data in the context of their chronic comorbidities is felt to place them at high risk for further clinical deterioration. Furthermore, it is not anticipated that the patient will be medically stable for discharge from the hospital within 2 midnights of admission.   * I certify that at the point of admission it is my clinical judgment that the patient will require inpatient hospital care spanning beyond 2 midnights from the point of admission due to high intensity of service, high risk for further deterioration and high frequency of surveillance required.*  Author: Rolla Plate, DO 08/12/2022 4:23 AM  For on call review [email or web address].

## 2022-08-12 NOTE — Progress Notes (Signed)
Patient admitted to unit this morning at 0200, awake and alert, c/o pain proximal to g-tube site, noted moderate amount of purulent drainage from g-tube site, cleansed and dressed with DSD. Bed bath and peri-care rendered, patient request to have suction at bedside for secretions, NPO except for ice chips, mouth care rendered, patient resting quietly with call bell in reach.

## 2022-08-12 NOTE — TOC Progression Note (Signed)
  Transition of Care [facility]) Screening Note   Patient Details  Name: Kathryn [name] Date of Birth: 06/20/1941   [facility]) CM/SW Contact:    Shade Flood, LCSW Phone Number: 08/12/2022, 10:24 AM    Transition of Care Department [facility]) has reviewed patient and no TOC needs have been identified at this time. We will continue to monitor patient advancement through interdisciplinary progression rounds. If new patient transition needs arise, please place a TOC consult.

## 2022-08-12 NOTE — Progress Notes (Signed)
Pharmacy Antibiotic Note  Kathryn [name] is a 82 y.o. female admitted on 08/11/2022 with intra-abdominal abscess.  Pharmacy has been consulted for vancomycin dosing.  Plan: Vancomycin 1056m x1 then 7523mIV Q24H. Goal AUC 400-550. Expected AUC 470.  SCr used 0.8 (actual 0.5). Also started appropriately on Zosyn per MD.  Height: 5' 3"$  (160 cm) Weight: 54.4 kg (120 lb) IBW/kg (Calculated) : 52.4  Temp (24hrs), Avg:98.2 F (36.8 C), Min:98 F (36.7 C), Max:98.4 F (36.9 C)  Recent Labs  Lab 08/11/22 1953  WBC 7.0  CREATININE 0.50    Estimated Creatinine Clearance: 44.1 mL/min (by C-G formula based on SCr of 0.5 mg/dL).    Allergies  Allergen Reactions   Cyclobenzaprine Hcl Swelling    Thank you for allowing pharmacy to be a part of this patient's care.  VeWynona NeatPharmD, BCPS  08/12/2022 4:30 AM

## 2022-08-12 NOTE — Telephone Encounter (Signed)
Ok she is in the hospital

## 2022-08-12 NOTE — Progress Notes (Signed)
Patient seen and examined; admitted after midnight secondary to abdominal pain around recently exchange G-tube with associated erythematous changes and purulent drainage.  No fever, no nausea, no vomiting, no elevation in her WBCs and overall is stable comprehensive metabolic panel.  Blood cultures has been taken and empirical antibiotic started.  Images suggesting compounding of intra-abdominal abscess with abdominal cellulitic infection.  Please refer to H&P written by Dr.Zierle-ghosh on 08/12/22 for further info/details on admission.  Plan: -Continue current antibiotic therapy -Continue as needed analgesics -Maintain adequate hydration and provide supportive care -Follow general surgery recommendations.  Barton Dubois MD [phone]

## 2022-08-12 NOTE — Assessment & Plan Note (Addendum)
-   Albumin 3.3 - Patient appears underweight - Normal BMI - Continue to follow recommendations by nutritional service regarding tube feedings and in between feeding supplements usage. -Continue to provide daily adequate hydration.

## 2022-08-12 NOTE — Progress Notes (Signed)
[facility]  G-tube in place very small area of possible fluid <1cm; Nothing really to drain.  Getting admitted for some antibiotics.  Will check on in AM.  Curlene Labrum, MD

## 2022-08-12 NOTE — Assessment & Plan Note (Addendum)
-   Status post G-tube replacement at the beginning of this month -Presenting with cellulitic process and cutaneous abscess. -No significant fluid collection requiring drainage by IR or surgical evacuation. -Completely functional G-tube as per general surgery recommendation -Continue IV antibiotics, as needed analgesics and supportive care. -Follow clinical response. -No fever and normal WBCs appreciated currently. -Continue to assess tolerance of G-tube usage. -After discussing with general surgery decision has been made to transfer care to their services. -Internal medicine will remain available for any needs.

## 2022-08-12 NOTE — Progress Notes (Signed)
Initial Nutrition Assessment  DOCUMENTATION CODES:   Not applicable  INTERVENTION:  - Initiate nocturnal feeds of Osmolite 1.5 @ 105 mL/hr x12 hrs.  - This will provide 1890 kcals, 79 gm protein and 960 mL free water.   - FWF of 100 mL Q4H - provides a free water total of 1560 mL (with TF).   NUTRITION DIAGNOSIS:   Inadequate enteral nutrition infusion related to other (see comment) (home tube feeding regimen) as evidenced by estimated needs.  GOAL:   Provide needs based on ASPEN/SCCM guidelines  MONITOR:   TF tolerance  REASON FOR ASSESSMENT:   Consult Enteral/tube feeding initiation and management  ASSESSMENT:   82 y.o. female admits related to abdominal pain. PMH includes: GERD, achalasia, DM, HTN. Pt is currently receiving medical management for intra-abdominal abscess.  Meds reviewed. Labs reviewed: Na low.   MD consult for EN management. Pt currently currently on a CL diet but has a tube for EN feeds. Per current documentation, pt has a G-tube, however, per RD note from a year ago, pt had a G-J tube. RD will reach out to general surgery for clarification. Will keep on continuous feeds for now.   Per record, pt has experienced weight gain over the past year. However, over the past 2 years patient has experienced a significant amount of wt loss. Per RN and pt, pt was receiving nocturnal feeds of Osmolite 1.2 @ 60 mL/hr x12 hrs. Unfortunately this only provides 864 kcals. RD will initiate nocturnal feeds of Osmolite 1.5 to meet 100% of estimated needs. Will modify formula to provide less total volume but still meet calorie and protein needs.   NUTRITION - FOCUSED PHYSICAL EXAM:  Unable to assess due to remote assessment.   Diet Order:   Diet Order             Diet clear liquid Room service appropriate? Yes; Fluid consistency: Thin  Diet effective now                   EDUCATION NEEDS:   Not appropriate for education at this time  Skin:  Skin Assessment:  Reviewed RN Assessment  Last BM:  2/14  Height:   Ht Readings from Last 1 Encounters:  08/11/22 5' 3"$  (1.6 m)    Weight:   Wt Readings from Last 1 Encounters:  08/11/22 54.4 kg    Ideal Body Weight:     BMI:  Body mass index is 21.26 kg/m.  Estimated Nutritional Needs:   Kcal:  1630-1900 kcals  Protein:  80-95 gm  Fluid:  >/= 1.6 L  Thalia Bloodgood, RD, LDN, CNSC.

## 2022-08-12 NOTE — Assessment & Plan Note (Signed)
Continue Protonix °

## 2022-08-12 NOTE — Telephone Encounter (Signed)
Noted  

## 2022-08-12 NOTE — Assessment & Plan Note (Addendum)
-   Currently diet controlled - Experiencing transient hypoglycemia while not receiving tube feedings. -Continue to follow CBGs fluctuation -Holding on any hypoglycemic medication regimen at this moment.

## 2022-08-13 DIAGNOSIS — T8143XA Infection following a procedure, organ and space surgical site, initial encounter: Secondary | ICD-10-CM | POA: Diagnosis not present

## 2022-08-13 DIAGNOSIS — L02211 Cutaneous abscess of abdominal wall: Secondary | ICD-10-CM | POA: Diagnosis not present

## 2022-08-13 DIAGNOSIS — K219 Gastro-esophageal reflux disease without esophagitis: Secondary | ICD-10-CM | POA: Diagnosis not present

## 2022-08-13 DIAGNOSIS — E441 Mild protein-calorie malnutrition: Secondary | ICD-10-CM | POA: Diagnosis not present

## 2022-08-13 LAB — GLUCOSE, CAPILLARY
Glucose-Capillary: 106 mg/dL — ABNORMAL HIGH (ref 70–99)
Glucose-Capillary: 165 mg/dL — ABNORMAL HIGH (ref 70–99)
Glucose-Capillary: 165 mg/dL — ABNORMAL HIGH (ref 70–99)
Glucose-Capillary: 80 mg/dL (ref 70–99)
Glucose-Capillary: 84 mg/dL (ref 70–99)
Glucose-Capillary: 85 mg/dL (ref 70–99)

## 2022-08-13 MED ORDER — KETOROLAC TROMETHAMINE 15 MG/ML IJ SOLN
15.0000 mg | Freq: Once | INTRAMUSCULAR | Status: AC
  Administered 2022-08-13: 15 mg via INTRAVENOUS
  Filled 2022-08-13: qty 1

## 2022-08-13 MED ORDER — OSMOLITE 1.2 CAL PO LIQD
1000.0000 mL | ORAL | Status: AC
  Administered 2022-08-13: 1000 mL
  Filled 2022-08-13: qty 1000

## 2022-08-13 NOTE — Progress Notes (Signed)
Flush complete.  Ambulated with walker and standby short distance in hallway.  Sitting in chair now. Daughter at bedside.

## 2022-08-13 NOTE — Care Management Important Message (Signed)
Important Message  Patient Details  Name: Kathryn [name] MRN: [id number] Date of Birth: 01/03/1941   Medicare Important Message Given:  Yes     Tommy Medal 08/13/2022, 11:24 AM

## 2022-08-13 NOTE — Progress Notes (Signed)
Progress Note   Patient: Kathryn [name] [id number] DOB: Mar 09, 1941 DOA: 08/11/2022     1 DOS: the patient was seen and examined on 08/13/2022   Brief hospital course: As per H&P written by Dr.Zierle-ghosh on 08/12/22 Kathryn [name] is a 82 y.o. female with medical history significant of G-tube secondary to achalasia, GERD, diabetes, hypertension, presents to the ER with a chief complaint of abdominal pain.  She reports the abdominal pain started 2-3 days ago.  Its been worse since it started.  She describes it as severe and sharp.  It is no worse with intake per tube.  It is no worse when she has a bowel movement.  Pain is constant throughout the day.  She denies any fever, nausea, vomiting, diarrhea.  Patient denies any chest pain, dyspnea.  She reports she has seen drainage from around her G-tube that is yellow and malodorous.  Patient denies any blood in her stool or melena.  Patient has no other complaints at this time.   Patient does not smoke, does not drink, does not use illicit drugs.  She is vaccinated for COVID.  Patient is full code.   Assessment and Plan: * Cutaneous abscess of abdominal wall - Status post G-tube replacement at the beginning of this month -Presenting with cellulitic process and cutaneous abscess. -No significant fluid collection requiring drainage by IR or surgical evacuation. -Completely functional G-tube as per general surgery recommendation -Continue IV antibiotics, as needed analgesics and supportive care. -Follow clinical response. -No fever and normal WBCs appreciated currently. -Continue to assess tolerance of G-tube usage. -After discussing with general surgery decision has been made to transfer care to their services. -Internal medicine will remain available for any needs.  GERD (gastroesophageal reflux disease) - Continue Protonix  Type 2 diabetes mellitus (HCC) - Currently diet controlled - Experiencing transient hypoglycemia while not receiving  tube feedings. -Continue to follow CBGs fluctuation -Holding on any hypoglycemic medication regimen at this moment.  Mild protein-calorie malnutrition (HCC) - Albumin 3.3 - Patient appears underweight - Normal BMI - Continue to follow recommendations by nutritional service regarding tube feedings and in between feeding supplements usage. -Continue to provide daily adequate hydration.    Subjective:  No fever, no chest pain, no nausea, no vomiting.  Transient episodes of hypoglycemia prior to initiation of tube feedings on 08/12/2022.  Reports feeling better.  Physical Exam: Vitals:   08/12/22 1232 08/12/22 2040 08/13/22 0415 08/13/22 0500  BP: 125/76 135/67 (!) 109/54   Pulse: 85 81 89   Resp: 19 20 14   $ Temp: 98.6 F (37 C) (!) 97 F (36.1 C) 97.6 F (36.4 C)   TempSrc: Oral     SpO2: 100% 100% 100%   Weight:    56.4 kg  Height:       General exam: Alert, awake, following commands appropriately; in no acute distress and is present improvement in her abdominal pain.  No fever.  No nausea or vomiting reported. Respiratory system: Clear to auscultation. Respiratory effort normal.  Good saturation on room air. Cardiovascular system:RRR. No rubs or gallops; no JVD. Gastrointestinal system: Abdomen is soft, nondistended and demonstrating improvement in surrounding areas where the G-tube is located.  No significant drainage appreciated today.  So far expressed good tolerance to current tube feedings. Central nervous system: Alert and oriented. No focal neurological deficits. Extremities: No cyanosis or clubbing. Skin: No petechiae. Psychiatry: Judgement and insight appear normal. Mood & affect appropriate.   Data Reviewed: CBGs: 66>> 136>>74 >>  165 Comprehensive metabolic panel: Sodium [id number], potassium 4.2, chloride 100, bicarb 25, BUN 14, creatinine 0.49 and normal LFTs.  Family Communication: No family at bedside.  Per chart review General surgery has updated family  members.  Disposition: Status is: Inpatient Remains inpatient appropriate because: Continue treatment with IV antibiotics.  Close monitoring to patient's response/tolerance to tube feedings and hypoglycemia avoidance.   Planned Discharge Destination: Home  Time spent: 30 minutes  Author: Barton Dubois, MD 08/13/2022 11:36 AM  For on call review [email or web address].

## 2022-08-13 NOTE — Progress Notes (Signed)
Alert and oriented.  Flush of 100  on pump.  Patient brushed own teeth.  No C/O nausea.

## 2022-08-13 NOTE — Progress Notes (Signed)
[facility]  Area around tube improving. Adjusted rate for her tube feeds based on nutrition goals. Had some hypoglycemia yesterday.   BP (!) 109/54 (BP Location: Right Arm)   Pulse 89   Temp 97.6 F (36.4 C)   Resp 14   Ht 5' 3"$  (1.6 m)   Wt 56.4 kg   SpO2 100%   BMI 22.03 kg/m   Area less indurated and red No drainage G tube in place  Patient with local abdominal wall abscess around the G tube. Overall doing better.  Will need a few more days of IV antibiotics  Will need a few more nights to adjust to the TF and make sure she is not having issues with hypoglycemia in the day Updated family, Daffney about plan at patient's request  Curlene Labrum, MD [facility] [address] [place], [place] [address] [phone] (office)

## 2022-08-13 NOTE — TOC Progression Note (Signed)
Transition of Care [facility]) - Progression Note    Patient Details  Name: KAZLYNN HIBBERD MRN: [id number] Date of Birth: 03/29/1941  Transition of Care [facility]) CM/SW Contact  Boneta Lucks, RN Phone Number: 08/13/2022, 10:07 AM  Clinical Narrative:   Patient completed home health with [facility]. They are following a will accept back if needed. TOC following. DC planning for weekend.      Barriers to Discharge: Continued Medical Work up  Expected Discharge Plan and Services      Social Determinants of Health (SDOH) Interventions SDOH Screenings   Food Insecurity: No Food Insecurity (08/12/2022)  Housing: Low Risk  (08/12/2022)  Transportation Needs: No Transportation Needs (08/12/2022)  Utilities: Not At Risk (08/12/2022)  Tobacco Use: Low Risk  (08/11/2022)    Readmission Risk Interventions    02/03/2021    4:22 PM  Readmission Risk Prevention Plan  Transportation Screening Complete  PCP or Specialist Appt within 3-5 Days Complete  HRI or [place] Complete  Social Work Consult for [place] Planning/Counseling Complete  Palliative Care Screening Not Applicable  Medication Review Press photographer) Complete

## 2022-08-14 LAB — BASIC METABOLIC PANEL
Anion gap: 5 (ref 5–15)
BUN: 19 mg/dL (ref 8–23)
CO2: 24 mmol/L (ref 22–32)
Calcium: 8.5 mg/dL — ABNORMAL LOW (ref 8.9–10.3)
Chloride: 106 mmol/L (ref 98–111)
Creatinine, Ser: 0.47 mg/dL (ref 0.44–1.00)
GFR, Estimated: >60 mL/min (ref >60)
Glucose, Bld: 131 mg/dL — ABNORMAL HIGH (ref 70–99)
Potassium: 4.7 mmol/L (ref 3.5–5.1)
Sodium: 135 mmol/L (ref 135–145)

## 2022-08-14 LAB — CBC
HCT: 34.4 % — ABNORMAL LOW (ref 36.0–46.0)
Hemoglobin: 11.1 g/dL — ABNORMAL LOW (ref 12.0–15.0)
MCH: 32 pg (ref 26.0–34.0)
MCHC: 32.3 g/dL (ref 30.0–36.0)
MCV: 99.1 fL (ref 80.0–100.0)
Platelets: 189 10*3/uL (ref 150–400)
RBC: 3.47 MIL/uL — ABNORMAL LOW (ref 3.87–5.11)
RDW: 13.9 % (ref 11.5–15.5)
WBC: 4.4 10*3/uL (ref 4.0–10.5)
nRBC: 0 % (ref 0.0–0.2)

## 2022-08-14 LAB — GLUCOSE, CAPILLARY
Glucose-Capillary: 139 mg/dL — ABNORMAL HIGH (ref 70–99)
Glucose-Capillary: 159 mg/dL — ABNORMAL HIGH (ref 70–99)
Glucose-Capillary: 120 mg/dL — ABNORMAL HIGH (ref 70–99)
Glucose-Capillary: 148 mg/dL — ABNORMAL HIGH (ref 70–99)
Glucose-Capillary: 91 mg/dL (ref 70–99)

## 2022-08-14 MED ORDER — OSMOLITE 1.2 CAL PO LIQD
1000.0000 mL | ORAL | Status: AC
  Administered 2022-08-14: 1000 mL
  Filled 2022-08-14: qty 1000

## 2022-08-14 NOTE — Progress Notes (Signed)
[facility]  Overall doing better. Redness and induration around tube improving. Tolerating feeds.  BP 119/67 (BP Location: Right Arm)   Pulse 80   Temp 97.9 F (36.6 C)   Resp 17   Ht 5' 3"$  (1.6 m)   Wt 56 kg   SpO2 100%   BMI 21.87 kg/m  Nontender around G tube Feeds going well  Patient w/ abdominal wall abscess around the G tube. Improving now with IV antibiotics and draining spontaneously. Her nutrition goals were adjusted while she was here and she is doing well with the feeds. She is very weak and needs to get out of bed to chair and work with PT.  I am worried she will need some PT at home .  Feeds 8pm-8am 162m goal Oob to chair Physical therapy   LCurlene Labrum MD RE[facility]unction[address] Blackburn LaneSHaven Ezel [phone](office)

## 2022-08-14 NOTE — Plan of Care (Signed)

## 2022-08-15 LAB — GLUCOSE, CAPILLARY
Glucose-Capillary: 125 mg/dL — ABNORMAL HIGH (ref 70–99)
Glucose-Capillary: 152 mg/dL — ABNORMAL HIGH (ref 70–99)
Glucose-Capillary: 187 mg/dL — ABNORMAL HIGH (ref 70–99)
Glucose-Capillary: 93 mg/dL (ref 70–99)
Glucose-Capillary: 93 mg/dL (ref 70–99)
Glucose-Capillary: 98 mg/dL (ref 70–99)

## 2022-08-15 NOTE — Progress Notes (Signed)
Pharmacy Antibiotic Note  Kathryn [name] is a 83 y.o. female admitted on 08/11/2022 with intra-abdominal abscess.  Pharmacy has been consulted for vancomycin dosing.  Plan: Vancomycin 723m IV Q24H. Goal AUC 400-550. Expected AUC 470.  SCr used 0.8 (actual 0.5). Also started appropriately on Zosyn per MD.  Height: 5' 3"$  (160 cm) Weight: 56 kg (123 lb 7.3 oz) IBW/kg (Calculated) : 52.4  Temp (24hrs), Avg:98 F (36.7 C), Min:97.8 F (36.6 C), Max:98.3 F (36.8 C)  Recent Labs  Lab 08/11/22 1953 08/12/22 0423 08/14/22 0521  WBC 7.0 6.3 4.4  CREATININE 0.50 0.49 0.47     Estimated Creatinine Clearance: 44.1 mL/min (by C-G formula based on SCr of 0.47 mg/dL).    Allergies  Allergen Reactions   Cyclobenzaprine Hcl Swelling    Thank you for allowing pharmacy to be a part of this patient's care.  SHart Robinsons PharmD Clinical Pharmacist  08/15/2022 8:15 AM

## 2022-08-15 NOTE — Progress Notes (Signed)
[facility]  Doing ok. Tolerating her feeds. Her susceptibilities are still pending for the culture. She is on IV antibiotics for now for Klebsiella and E coli.   BP 125/67 (BP Location: Right Arm)   Pulse 84   Temp 97.8 F (36.6 C)   Resp 20   Ht 5' 3"$  (1.6 m)   Wt 56 kg   SpO2 100%   BMI 21.87 kg/m  G tube in place, flushed Induration improved, no erythema  Patient with abdominal wall abscess around the G tube. Doing better.  Hopefully susceptibilities back tomorrow and can dc on oral option TF at goal PT to work with patient  Hopefully home tomorrow  Curlene Labrum, MD [facility] [address] [place], [place] [address] [phone] (office)

## 2022-08-16 LAB — CREATININE, SERUM
Creatinine, Ser: 0.53 mg/dL (ref 0.44–1.00)
GFR, Estimated: >60 mL/min (ref >60)

## 2022-08-16 LAB — GLUCOSE, CAPILLARY: Glucose-Capillary: 144 mg/dL — ABNORMAL HIGH (ref 70–99)

## 2022-08-16 MED ORDER — SULFAMETHOXAZOLE-TRIMETHOPRIM 800-160 MG PO TABS: 1.0000 | ORAL_TABLET | Freq: Two times a day (BID) | ORAL | 0 refills | Status: AC

## 2022-08-16 MED ORDER — CIPROFLOXACIN HCL 500 MG PO TABS: 500.0000 mg | ORAL_TABLET | Freq: Two times a day (BID) | ORAL | 0 refills | Status: AC

## 2022-08-16 MED ORDER — FREE WATER: 100.0000 mL | Status: AC

## 2022-08-16 MED ORDER — OSMOLITE 1.5 CAL PO LIQD: 1000.0000 mL | ORAL | 0 refills | Status: AC

## 2022-08-16 NOTE — Discharge Instructions (Signed)
Nocturnal feeds of Osmolite 1.5 @ 105 mL/hr over 12 hrs; START at 8PM and STOP at 8 am; Flush tube with 176m of water every 4 hours daily.   Ciprofloxacin and Bactrim DS are the two antibiotics you will take for the next 3 days to complete the course of antibiotics. You will need to crush these medications, mix with 334mof warm water and put them through the tube.   Monitor G tube site.

## 2022-08-16 NOTE — Progress Notes (Signed)
Vitals stable, pt slept through the night. No c/o pain noted. +1 edema assessed in bilateral lower extremities.

## 2022-08-16 NOTE — Evaluation (Signed)
Physical Therapy Evaluation Patient Details Name: Kathryn [name] MRN: [id number] DOB: Dec 28, 1940 Today's Date: 08/16/2022  History of Present Illness  Kathryn [name] is a 82 y.o. female with medical history significant of G-tube secondary to achalasia, GERD, diabetes, hypertension, presents to the ER with a chief complaint of abdominal pain.  She reports the abdominal pain started 2-3 days ago.  Its been worse since it started.  She describes it as severe and sharp.  It is no worse with intake per tube.  It is no worse when she has a bowel movement.  Pain is constant throughout the day.  She denies any fever, nausea, vomiting, diarrhea.  Patient denies any chest pain, dyspnea.  She reports she has seen drainage from around her G-tube that is yellow and malodorous.  Patient denies any blood in her stool or melena.  Patient has no other complaints at this time.   Clinical Impression  Patient has difficulty completing sit to stands mostly due to stiffness in knees which is baseline for patient and states she does better wearing her shoes at home, once standing demonstrates good return for maintaining standing balance using RW and able to ambulate in room/hallway without loss of balance.  Patient tolerated sitting up in chair after therapy - nursing staff notified.  PLAN:  Patient to be discharged home today and discharged from acute physical therapy to care of nursing for ambulation as tolerated for length of stay with recommendations stated below         Recommendations for follow up therapy are one component of a multi-disciplinary discharge planning process, led by the attending physician.  Recommendations may be updated based on patient status, additional functional criteria and insurance authorization.  Follow Up Recommendations Home health PT      Assistance Recommended at Discharge Set up Supervision/Assistance  Patient can return home with the following  A little help with walking and/or  transfers;A little help with bathing/dressing/bathroom;Help with stairs or ramp for entrance;Assistance with cooking/housework    Equipment Recommendations None recommended by PT  Recommendations for Other Services       Functional Status Assessment Patient has had a recent decline in their functional status and demonstrates the ability to make significant improvements in function in a reasonable and predictable amount of time.     Precautions / Restrictions Precautions Precautions: Fall Restrictions Weight Bearing Restrictions: No      Mobility  Bed Mobility Overal bed mobility: Needs Assistance Bed Mobility: Supine to Sit     Supine to sit: Min assist     General bed mobility comments: increased time, labored movement    Transfers Overall transfer level: Needs assistance Equipment used: Rolling walker (2 wheels) Transfers: Sit to/from Stand, Bed to chair/wheelchair/BSC Sit to Stand: Min assist, Mod assist   Step pivot transfers: Min assist       General transfer comment: has difficulty completing sit to stands mostly due to stiffness in knees, limited bilateral knee flexion, grossly 0 - 50 degrees, does better wearing her shoes from home per patient    Ambulation/Gait Ambulation/Gait assistance: Supervision, Min guard Gait Distance (Feet): 60 Feet Assistive device: Rolling walker (2 wheels) Gait Pattern/deviations: Decreased step length - right, Decreased step length - left, Decreased stride length, Trunk flexed Gait velocity: decreased     General Gait Details: slow labored cadence without loss of balance, limited mostly due to c/o fatigue  Stairs            Wheelchair Mobility  Modified Rankin (Stroke Patients Only)       Balance Overall balance assessment: Needs assistance Sitting-balance support: Feet supported, No upper extremity supported Sitting balance-Leahy Scale: Fair Sitting balance - Comments: fair/good seated at EOB   Standing  balance support: During functional activity, Bilateral upper extremity supported Standing balance-Leahy Scale: Fair Standing balance comment: fair/good using RW                             Pertinent Vitals/Pain Pain Assessment Pain Assessment: No/denies pain    Home Living Family/patient expects to be discharged to:: Private residence Living Arrangements: Children Available Help at Discharge: Family;Available 24 hours/day Type of Home: House Home Access: Level entry       Home Layout: One level Home Equipment: Conservation officer, nature (2 wheels);Shower seat      Prior Function Prior Level of Function : Needs assist       Physical Assist : Mobility (physical);ADLs (physical) Mobility (physical): Bed mobility;Transfers;Gait;Stairs   Mobility Comments: household ambulator using RW, pulls self to sitting use bed rail on her hospital bed ADLs Comments: assisted by family     Hand Dominance   Dominant Hand: Right    Extremity/Trunk Assessment   Upper Extremity Assessment Upper Extremity Assessment: Generalized weakness    Lower Extremity Assessment Lower Extremity Assessment: Generalized weakness    Cervical / Trunk Assessment Cervical / Trunk Assessment: Kyphotic  Communication   Communication: No difficulties  Cognition Arousal/Alertness: Awake/alert Behavior During Therapy: WFL for tasks assessed/performed Overall Cognitive Status: Within Functional Limits for tasks assessed                                          General Comments      Exercises     Assessment/Plan    PT Assessment All further PT needs can be met in the next venue of care  PT Problem List Decreased strength;Decreased activity tolerance;Decreased balance;Decreased mobility;Decreased range of motion       PT Treatment Interventions      PT Goals (Current goals can be found in the Care Plan section)  Acute Rehab PT Goals Patient Stated Goal: return home with  family to assist PT Goal Formulation: With patient Time For Goal Achievement: 08/16/22 Potential to Achieve Goals: Good    Frequency       Co-evaluation               AM-PAC PT "6 Clicks" Mobility  Outcome Measure Help needed turning from your back to your side while in a flat bed without using bedrails?: None Help needed moving from lying on your back to sitting on the side of a flat bed without using bedrails?: A Little Help needed moving to and from a bed to a chair (including a wheelchair)?: A Little Help needed standing up from a chair using your arms (e.g., wheelchair or bedside chair)?: A Lot Help needed to walk in hospital room?: A Little Help needed climbing 3-5 steps with a railing? : A Lot 6 Click Score: 17    End of Session   Activity Tolerance: Patient tolerated treatment well;Patient limited by fatigue Patient left: in chair;with call bell/phone within reach Nurse Communication: Mobility status PT Visit Diagnosis: Unsteadiness on feet (R26.81);Other abnormalities of gait and mobility (R26.89);Muscle weakness (generalized) (M62.81)    Time: [id number] PT Time Calculation (min) (ACUTE  ONLY): 17 min   Charges:   PT Evaluation $PT Eval Low Complexity: 1 Low PT Treatments $Therapeutic Activity: 8-22 mins        11:35 AM, 08/16/22 Lonell Grandchild, MPT Physical Therapist with [facility] 336 [phone] office [phone] mobile phone

## 2022-08-16 NOTE — Progress Notes (Signed)
Nsg Discharge Note  Admit Date:  08/11/2022 Discharge date: 08/16/2022   Kathryn [name] to be D/C'd Home per MD order.  AVS completed.  Copy for chart, and copy for patient signed, and dated. Patient/caregiver able to verbalize understanding.  Discharge Medication: Allergies as of 08/16/2022       Reactions   Cyclobenzaprine Hcl Swelling        Medication List     TAKE these medications    Accu-Chek Aviva Plus test strip Generic drug: glucose blood 1 each 4 (four) times daily.   acetaminophen 160 MG/5ML elixir Commonly known as: TYLENOL Take 15.6 mLs (500 mg total) by mouth every 4 (four) hours as needed for fever or pain.   ciprofloxacin 500 MG tablet Commonly known as: Cipro Place 1 tablet (500 mg total) into feeding tube 2 (two) times daily for 3 days. Crush tablets and mix with 30 mL warm water mix well and put through tube then flush the tube well after the med   diphenhydrAMINE 12.5 MG/5ML elixir Commonly known as: BENADRYL Take 12.5 mg by mouth daily as needed.   feeding supplement (OSMOLITE 1.5 CAL) Liqd Place 1,000 mLs into feeding tube daily. Nocturnal feeds of Osmolite 1.5 @ 105 mL/hr over 12 hrs; START at 8PM and STOP at 8 am. What changed: additional instructions   free water Soln Place 100 mLs into feeding tube every 4 (four) hours. What changed:  how much to take when to take this   hydroxypropyl methylcellulose / hypromellose 2.5 % ophthalmic solution Commonly known as: ISOPTO TEARS / GONIOVISC Place 1 drop into both eyes 4 (four) times daily as needed for dry eyes.   pantoprazole 40 MG tablet Commonly known as: Protonix Take 1 tablet (40 mg total) by mouth daily. Crush pill and place through feeding tube as educated to prior, flush feeding tube after pill is given   sulfamethoxazole-trimethoprim 800-160 MG tablet Commonly known as: BACTRIM DS Take 1 tablet by mouth 2 (two) times daily for 3 days. Crush the meds and mix with 30 mL of warm water  and put through the tube, flush the tube well after the medication        Discharge Assessment: Vitals:   08/15/22 1951 08/16/22 0432  BP: (!) 150/72 122/67  Pulse: 80 82  Resp: 20 18  Temp: 98.4 F (36.9 C) 98.4 F (36.9 C)  SpO2: 99% 100%   Skin clean, dry and intact without evidence of skin break down, no evidence of skin tears noted. IV catheter discontinued intact. Site without signs and symptoms of complications - no redness or edema noted at insertion site, patient denies c/o pain - only slight tenderness at site.  Dressing with slight pressure applied.  D/c Instructions-Education: Discharge instructions given to patient/family with verbalized understanding. D/c education completed with patient/family including follow up instructions, medication list, d/c activities limitations if indicated, with other d/c instructions as indicated by MD - patient able to verbalize understanding, all questions fully answered. Patient instructed to return to ED, call 911, or call MD for any changes in condition.  Patient escorted via [place], and D/C home via private auto.  Clovis Fredrickson, LPN [id number] AM

## 2022-08-16 NOTE — TOC Transition Note (Signed)
Transition of Care [facility]) - CM/SW Discharge Note   Patient Details  Name: Kathryn [name] MRN: [id number] Date of Birth: 1941/04/06  Transition of Care [facility]) CM/SW Contact:  Boneta Lucks, RN Phone Number: 08/16/2022, 9:55 AM   Clinical Narrative:   Patient discharging home today. PT is recommending HHPT.  Patient had completed HHPT with [facility]. They will accept back, Referral sent to [place]. MD award to order.   Final next level of care: Home w Home Health Services Barriers to Discharge: Barriers Resolved   Patient Goals and CMS Choice CMS Medicare.gov Compare Post Acute Care list provided to:: Patient Represenative (must comment) Choice offered to / list presented to : Adult Children  Discharge Placement      Patient and family notified of of transfer: 08/16/22  Discharge Plan and Services Additional resources added to the After Visit Summary for        [place]: [place] ([place]) Date [place]: 08/16/22 Time [place]: [phone] Representative spoke with at [place]: [place] ([place]) Interventions SDOH Screenings   Food Insecurity: No Food Insecurity (08/12/2022)  Housing: Low Risk  (08/12/2022)  Transportation Needs: No Transportation Needs (08/12/2022)  Utilities: Not At Risk (08/12/2022)  Tobacco Use: Low Risk  (08/11/2022)     Readmission Risk Interventions    08/16/2022    9:54 AM 02/03/2021    4:22 PM  Readmission Risk Prevention Plan  Post Dischage Appt Not Complete   Medication Screening Complete   Transportation Screening Complete Complete  PCP or Specialist Appt within 3-5 Days  Complete  HRI or [place]  Complete  Social Work Consult for [place] Planning/Counseling  Complete  Palliative Care Screening  Not Applicable  Medication Review Press photographer)  Complete

## 2022-08-16 NOTE — Discharge Summary (Signed)
Physician Discharge Summary  Patient ID: Kathryn [name] MRN: [id number] DOB/AGE: Apr 09, 1941 82 y.o.  Admit date: 08/11/2022 Discharge date: 08/16/2022  Admission Diagnoses: Abdominal wall abscess   Discharge Diagnoses:  Principal Problem:   Cutaneous abscess of abdominal wall Active Problems:   Mild protein-calorie malnutrition (HCC)   Type 2 diabetes mellitus (HCC)   GERD (gastroesophageal reflux disease)   Discharged Condition: good  Hospital Course: [name] is an 82 yo who developed an abdominal wall abscess at her G tube site. She was brought in for antibiotics and this drained spontaneously. While inpatient nutrition saw her and adjusted her TF as she had been receiving suboptimal calories. She tolerated this adjustment well. She was on IV antibiotics and this was transitioned over to oral through her tube after sensitivities came back on the organisms.   Consults:  Hospitalist admission, taken over by surgery, nutrition/dietitian   Significant Diagnostic Studies: CT with abscess adjacent to G tube   Treatments: IV hydration, antibiotics: vancomycin and Zosyn, and Tube feed adjustment  Discharge Exam: Blood pressure 122/67, pulse 82, temperature 98.4 F (36.9 C), temperature source Oral, resp. rate 18, height 5' 3"$  (1.6 m), weight 57.6 kg, SpO2 100 %. General appearance: alert and no distress GI: G tube with on erythema or induration around it  Disposition: Discharge disposition: 01-Home or Self Care       Discharge Instructions     Call MD for:  difficulty breathing, headache or visual disturbances   Complete by: As directed    Call MD for:  extreme fatigue   Complete by: As directed    Call MD for:  persistant dizziness or light-headedness   Complete by: As directed    Call MD for:  persistant nausea and vomiting   Complete by: As directed    Call MD for:  redness, tenderness, or signs of infection (pain, swelling, redness, odor or green/yellow discharge  around incision site)   Complete by: As directed    Call MD for:  severe uncontrolled pain   Complete by: As directed    Call MD for:  temperature >100.4   Complete by: As directed    Discharge instructions   Complete by: As directed    Nocturnal feeds of Osmolite 1.5 @ 105 mL/hr over 12 hrs; START at 8PM and STOP at 8 am; Flush tube with 139m of water every 4 hours daily.   Increase activity slowly   Complete by: As directed       Allergies as of 08/16/2022       Reactions   Cyclobenzaprine Hcl Swelling        Medication List     TAKE these medications    Accu-Chek Aviva Plus test strip Generic drug: glucose blood 1 each 4 (four) times daily.   acetaminophen 160 MG/5ML elixir Commonly known as: TYLENOL Take 15.6 mLs (500 mg total) by mouth every 4 (four) hours as needed for fever or pain.   ciprofloxacin 500 MG tablet Commonly known as: Cipro Place 1 tablet (500 mg total) into feeding tube 2 (two) times daily for 3 days. Crush tablets and mix with 30 mL warm water mix well and put through tube then flush the tube well after the med   diphenhydrAMINE 12.5 MG/5ML elixir Commonly known as: BENADRYL Take 12.5 mg by mouth daily as needed.   feeding supplement (OSMOLITE 1.5 CAL) Liqd Place 1,000 mLs into feeding tube daily. Nocturnal feeds of Osmolite 1.5 @ 105 mL/hr over 12 hrs;  START at 8PM and STOP at 8 am. What changed: additional instructions   free water Soln Place 100 mLs into feeding tube every 4 (four) hours. What changed:  how much to take when to take this   hydroxypropyl methylcellulose / hypromellose 2.5 % ophthalmic solution Commonly known as: ISOPTO TEARS / GONIOVISC Place 1 drop into both eyes 4 (four) times daily as needed for dry eyes.   pantoprazole 40 MG tablet Commonly known as: Protonix Take 1 tablet (40 mg total) by mouth daily. Crush pill and place through feeding tube as educated to prior, flush feeding tube after pill is given    sulfamethoxazole-trimethoprim 800-160 MG tablet Commonly known as: BACTRIM DS Take 1 tablet by mouth 2 (two) times daily for 3 days. Crush the meds and mix with 30 mL of warm water and put through the tube, flush the tube well after the medication        Follow-up [place], [facility] Follow up.   Why: PT will call to schedule your home visit. Contact information: [address] [place] [address] [phone]                Nocturnal feeds of Osmolite 1.5 @ 105 mL/hr over 12 hrs; START at 8PM and STOP at 8 am; Flush tube with 163m of water every 4 hours daily.   Signed: LVirl Cagey2/19/2024, 11:20 AM

## 2022-08-17 ENCOUNTER — Ambulatory Visit: Payer: Medicare Other | Admitting: General Surgery

## 2022-08-17 DIAGNOSIS — K220 Achalasia of cardia: Secondary | ICD-10-CM | POA: Diagnosis not present

## 2022-08-17 DIAGNOSIS — L02211 Cutaneous abscess of abdominal wall: Secondary | ICD-10-CM | POA: Diagnosis not present

## 2022-08-17 DIAGNOSIS — K219 Gastro-esophageal reflux disease without esophagitis: Secondary | ICD-10-CM | POA: Diagnosis not present

## 2022-08-17 DIAGNOSIS — Z431 Encounter for attention to gastrostomy: Secondary | ICD-10-CM | POA: Diagnosis not present

## 2022-08-17 DIAGNOSIS — E441 Mild protein-calorie malnutrition: Secondary | ICD-10-CM | POA: Diagnosis not present

## 2022-08-17 DIAGNOSIS — E119 Type 2 diabetes mellitus without complications: Secondary | ICD-10-CM | POA: Diagnosis not present

## 2022-08-17 DIAGNOSIS — I10 Essential (primary) hypertension: Secondary | ICD-10-CM | POA: Diagnosis not present

## 2022-08-17 LAB — CULTURE, BLOOD (ROUTINE X 2)
Culture: NO GROWTH
Special Requests: ADEQUATE

## 2022-08-18 LAB — AEROBIC/ANAEROBIC CULTURE W GRAM STAIN (SURGICAL/DEEP WOUND)

## 2022-08-18 LAB — CULTURE, BLOOD (ROUTINE X 2)
Culture: NO GROWTH
Special Requests: ADEQUATE

## 2022-08-20 DIAGNOSIS — E119 Type 2 diabetes mellitus without complications: Secondary | ICD-10-CM | POA: Diagnosis not present

## 2022-08-20 DIAGNOSIS — K220 Achalasia of cardia: Secondary | ICD-10-CM | POA: Diagnosis not present

## 2022-08-20 DIAGNOSIS — I10 Essential (primary) hypertension: Secondary | ICD-10-CM | POA: Diagnosis not present

## 2022-08-20 DIAGNOSIS — L02211 Cutaneous abscess of abdominal wall: Secondary | ICD-10-CM | POA: Diagnosis not present

## 2022-08-20 DIAGNOSIS — E441 Mild protein-calorie malnutrition: Secondary | ICD-10-CM | POA: Diagnosis not present

## 2022-08-20 DIAGNOSIS — K219 Gastro-esophageal reflux disease without esophagitis: Secondary | ICD-10-CM | POA: Diagnosis not present

## 2022-08-20 DIAGNOSIS — Z431 Encounter for attention to gastrostomy: Secondary | ICD-10-CM | POA: Diagnosis not present

## 2022-08-23 DIAGNOSIS — I10 Essential (primary) hypertension: Secondary | ICD-10-CM | POA: Diagnosis not present

## 2022-08-23 DIAGNOSIS — E119 Type 2 diabetes mellitus without complications: Secondary | ICD-10-CM | POA: Diagnosis not present

## 2022-08-23 DIAGNOSIS — E441 Mild protein-calorie malnutrition: Secondary | ICD-10-CM | POA: Diagnosis not present

## 2022-08-23 DIAGNOSIS — K219 Gastro-esophageal reflux disease without esophagitis: Secondary | ICD-10-CM | POA: Diagnosis not present

## 2022-08-23 DIAGNOSIS — K220 Achalasia of cardia: Secondary | ICD-10-CM | POA: Diagnosis not present

## 2022-08-23 DIAGNOSIS — L02211 Cutaneous abscess of abdominal wall: Secondary | ICD-10-CM | POA: Diagnosis not present

## 2022-08-23 DIAGNOSIS — Z431 Encounter for attention to gastrostomy: Secondary | ICD-10-CM | POA: Diagnosis not present

## 2022-08-26 ENCOUNTER — Encounter: Payer: Self-pay | Admitting: Radiology

## 2022-08-26 ENCOUNTER — Ambulatory Visit: Payer: Medicare Other | Admitting: General Surgery

## 2022-08-26 ENCOUNTER — Encounter: Payer: Self-pay | Admitting: General Surgery

## 2022-08-26 VITALS — BP 113/67 | HR 103 | Temp 98.7°F | Resp 12 | Ht 63.0 in | Wt 126.0 lb

## 2022-08-26 DIAGNOSIS — Z931 Gastrostomy status: Secondary | ICD-10-CM | POA: Diagnosis not present

## 2022-08-26 NOTE — Patient Instructions (Signed)
Ok to split your feeds up and not do at night if that is better for you. You can split it and do during the day if that is better for you. Just get the calories in for a 24 hour day.

## 2022-08-27 NOTE — Progress Notes (Signed)
[facility]  Doing well with G tube feeds. Finished antibiotics.  BP 113/67   Pulse (!) 103   Temp 98.7 F (37.1 C) (Oral)   Resp 12   Ht '5\' 3"'$  (1.6 m)   Wt 126 lb (57.2 kg)   SpO2 95%   BMI 22.32 kg/m  G tube in place, no induration or erythema  Patient s/p treatment for infection of the abdominal wall around the G tube. Doing well. Tolerating her feeds but wants to know about doing it at times in the day other than at night as she does not do much in the day and is at home.  Ok to split your feeds up and not do at night if that is better for you. You can split it and do during the day if that is better for you. Just get the calories in for a 24 hour day.   PRN follow up   Curlene Labrum, MD [facility] [address] [place], [place] [address] [phone] (office)

## 2022-08-30 DIAGNOSIS — R1310 Dysphagia, unspecified: Secondary | ICD-10-CM | POA: Diagnosis not present

## 2022-08-30 DIAGNOSIS — R634 Abnormal weight loss: Secondary | ICD-10-CM | POA: Diagnosis not present

## 2022-08-30 DIAGNOSIS — E8809 Other disorders of plasma-protein metabolism, not elsewhere classified: Secondary | ICD-10-CM | POA: Diagnosis not present

## 2022-08-30 DIAGNOSIS — E871 Hypo-osmolality and hyponatremia: Secondary | ICD-10-CM | POA: Diagnosis not present

## 2022-08-30 DIAGNOSIS — E43 Unspecified severe protein-calorie malnutrition: Secondary | ICD-10-CM | POA: Diagnosis not present

## 2022-09-01 DIAGNOSIS — K220 Achalasia of cardia: Secondary | ICD-10-CM | POA: Diagnosis not present

## 2022-09-01 DIAGNOSIS — E119 Type 2 diabetes mellitus without complications: Secondary | ICD-10-CM | POA: Diagnosis not present

## 2022-09-01 DIAGNOSIS — Z431 Encounter for attention to gastrostomy: Secondary | ICD-10-CM | POA: Diagnosis not present

## 2022-09-01 DIAGNOSIS — I10 Essential (primary) hypertension: Secondary | ICD-10-CM | POA: Diagnosis not present

## 2022-09-01 DIAGNOSIS — L02211 Cutaneous abscess of abdominal wall: Secondary | ICD-10-CM | POA: Diagnosis not present

## 2022-09-01 DIAGNOSIS — E441 Mild protein-calorie malnutrition: Secondary | ICD-10-CM | POA: Diagnosis not present

## 2022-09-01 DIAGNOSIS — K219 Gastro-esophageal reflux disease without esophagitis: Secondary | ICD-10-CM | POA: Diagnosis not present

## 2022-09-03 DIAGNOSIS — Z431 Encounter for attention to gastrostomy: Secondary | ICD-10-CM | POA: Diagnosis not present

## 2022-09-03 DIAGNOSIS — E441 Mild protein-calorie malnutrition: Secondary | ICD-10-CM | POA: Diagnosis not present

## 2022-09-03 DIAGNOSIS — I10 Essential (primary) hypertension: Secondary | ICD-10-CM | POA: Diagnosis not present

## 2022-09-03 DIAGNOSIS — K220 Achalasia of cardia: Secondary | ICD-10-CM | POA: Diagnosis not present

## 2022-09-03 DIAGNOSIS — L02211 Cutaneous abscess of abdominal wall: Secondary | ICD-10-CM | POA: Diagnosis not present

## 2022-09-03 DIAGNOSIS — E119 Type 2 diabetes mellitus without complications: Secondary | ICD-10-CM | POA: Diagnosis not present

## 2022-09-03 DIAGNOSIS — K219 Gastro-esophageal reflux disease without esophagitis: Secondary | ICD-10-CM | POA: Diagnosis not present

## 2022-09-08 DIAGNOSIS — K219 Gastro-esophageal reflux disease without esophagitis: Secondary | ICD-10-CM | POA: Diagnosis not present

## 2022-09-08 DIAGNOSIS — K220 Achalasia of cardia: Secondary | ICD-10-CM | POA: Diagnosis not present

## 2022-09-08 DIAGNOSIS — L02211 Cutaneous abscess of abdominal wall: Secondary | ICD-10-CM | POA: Diagnosis not present

## 2022-09-08 DIAGNOSIS — E119 Type 2 diabetes mellitus without complications: Secondary | ICD-10-CM | POA: Diagnosis not present

## 2022-09-08 DIAGNOSIS — I10 Essential (primary) hypertension: Secondary | ICD-10-CM | POA: Diagnosis not present

## 2022-09-08 DIAGNOSIS — Z431 Encounter for attention to gastrostomy: Secondary | ICD-10-CM | POA: Diagnosis not present

## 2022-09-08 DIAGNOSIS — E441 Mild protein-calorie malnutrition: Secondary | ICD-10-CM | POA: Diagnosis not present

## 2022-09-13 ENCOUNTER — Telehealth: Payer: Self-pay | Admitting: *Deleted

## 2022-09-13 NOTE — Telephone Encounter (Signed)
Received call from patient daughter, Rannie [phone]) 72- 50~ telephone.   Reports that patient has excessive saliva and mucus production. States that she has been using suction machine to alleviate excessive mucus from mouth. Reports that machine has broken and will need to be replaced.   Advised to contact PCP or ST for orders.

## 2022-09-14 DIAGNOSIS — I10 Essential (primary) hypertension: Secondary | ICD-10-CM | POA: Diagnosis not present

## 2022-09-14 DIAGNOSIS — K219 Gastro-esophageal reflux disease without esophagitis: Secondary | ICD-10-CM | POA: Diagnosis not present

## 2022-09-14 DIAGNOSIS — E119 Type 2 diabetes mellitus without complications: Secondary | ICD-10-CM | POA: Diagnosis not present

## 2022-09-14 DIAGNOSIS — K220 Achalasia of cardia: Secondary | ICD-10-CM | POA: Diagnosis not present

## 2022-09-14 DIAGNOSIS — Z431 Encounter for attention to gastrostomy: Secondary | ICD-10-CM | POA: Diagnosis not present

## 2022-09-14 DIAGNOSIS — E441 Mild protein-calorie malnutrition: Secondary | ICD-10-CM | POA: Diagnosis not present

## 2022-09-14 DIAGNOSIS — L02211 Cutaneous abscess of abdominal wall: Secondary | ICD-10-CM | POA: Diagnosis not present

## 2022-09-17 DIAGNOSIS — I10 Essential (primary) hypertension: Secondary | ICD-10-CM | POA: Diagnosis not present

## 2022-09-17 DIAGNOSIS — L02211 Cutaneous abscess of abdominal wall: Secondary | ICD-10-CM | POA: Diagnosis not present

## 2022-09-17 DIAGNOSIS — Z431 Encounter for attention to gastrostomy: Secondary | ICD-10-CM | POA: Diagnosis not present

## 2022-09-17 DIAGNOSIS — K219 Gastro-esophageal reflux disease without esophagitis: Secondary | ICD-10-CM | POA: Diagnosis not present

## 2022-09-17 DIAGNOSIS — K220 Achalasia of cardia: Secondary | ICD-10-CM | POA: Diagnosis not present

## 2022-09-17 DIAGNOSIS — E119 Type 2 diabetes mellitus without complications: Secondary | ICD-10-CM | POA: Diagnosis not present

## 2022-09-17 DIAGNOSIS — E441 Mild protein-calorie malnutrition: Secondary | ICD-10-CM | POA: Diagnosis not present

## 2022-09-29 DIAGNOSIS — E43 Unspecified severe protein-calorie malnutrition: Secondary | ICD-10-CM | POA: Diagnosis not present

## 2022-09-29 DIAGNOSIS — E871 Hypo-osmolality and hyponatremia: Secondary | ICD-10-CM | POA: Diagnosis not present

## 2022-09-29 DIAGNOSIS — R1310 Dysphagia, unspecified: Secondary | ICD-10-CM | POA: Diagnosis not present

## 2022-09-29 DIAGNOSIS — R634 Abnormal weight loss: Secondary | ICD-10-CM | POA: Diagnosis not present

## 2022-09-29 DIAGNOSIS — E8809 Other disorders of plasma-protein metabolism, not elsewhere classified: Secondary | ICD-10-CM | POA: Diagnosis not present

## 2022-10-06 DIAGNOSIS — E119 Type 2 diabetes mellitus without complications: Secondary | ICD-10-CM | POA: Diagnosis not present

## 2022-10-31 DIAGNOSIS — E8809 Other disorders of plasma-protein metabolism, not elsewhere classified: Secondary | ICD-10-CM | POA: Diagnosis not present

## 2022-10-31 DIAGNOSIS — E871 Hypo-osmolality and hyponatremia: Secondary | ICD-10-CM | POA: Diagnosis not present

## 2022-10-31 DIAGNOSIS — R1310 Dysphagia, unspecified: Secondary | ICD-10-CM | POA: Diagnosis not present

## 2022-10-31 DIAGNOSIS — R634 Abnormal weight loss: Secondary | ICD-10-CM | POA: Diagnosis not present

## 2022-10-31 DIAGNOSIS — E43 Unspecified severe protein-calorie malnutrition: Secondary | ICD-10-CM | POA: Diagnosis not present

## 2022-11-06 ENCOUNTER — Emergency Department (HOSPITAL_COMMUNITY): Payer: Medicare Other

## 2022-11-06 ENCOUNTER — Other Ambulatory Visit: Payer: Self-pay

## 2022-11-06 ENCOUNTER — Emergency Department (HOSPITAL_COMMUNITY)
Admission: EM | Admit: 2022-11-06 | Discharge: 2022-11-06 | Disposition: A | Discharge status: Home / Self Care | Payer: Medicare Other | Attending: Emergency Medicine | Admitting: Emergency Medicine

## 2022-11-06 ENCOUNTER — Encounter (HOSPITAL_COMMUNITY): Payer: Self-pay | Admitting: Emergency Medicine

## 2022-11-06 DIAGNOSIS — K9423 Gastrostomy malfunction: Secondary | ICD-10-CM

## 2022-11-06 DIAGNOSIS — Z4659 Encounter for fitting and adjustment of other gastrointestinal appliance and device: Secondary | ICD-10-CM | POA: Diagnosis not present

## 2022-11-06 MED ORDER — DIATRIZOATE MEGLUMINE & SODIUM 66-10 % PO SOLN
60.0000 mL | Freq: Once | ORAL | Status: AC
  Administered 2022-11-06: 60 mL
  Filled 2022-11-06: qty 60

## 2022-11-06 NOTE — ED Notes (Signed)
DR said hold off on XR.  He had to REPLACE the current PEG tube.  It was out as the patient rolled to get prepared for the PEG tube placement verification image with contrast.  PLEASE ADVISE IF NEEDING TO PERFORM AN IMAGE.Marland KitchenMarland KitchenPut in another XR ABDOMEN request - [facility] YOU Team

## 2022-11-06 NOTE — ED Triage Notes (Signed)
Pt via POV with daughter after pt's PEG tube came out at home at around 5pm or so. Pt's daughter reinserted the tube but is not sure it is secure. Pt denies pain. No other complaints.

## 2022-11-06 NOTE — ED Notes (Signed)
ED Provider at bedside. 

## 2022-11-06 NOTE — Discharge Instructions (Signed)
Call Dr. Henreitta Leber and let her know that we placed a new feeding tube and

## 2022-11-06 NOTE — ED Provider Notes (Signed)
[place] EMERGENCY DEPARTMENT AT [facility] Provider Note   CSN: [id number] Arrival date & time: 11/06/22  1752     History {Add pertinent medical, surgical, social history, OB history to HPI:1} Chief Complaint  Patient presents with   feeding tube problem    Kathryn [name] is a 82 y.o. female.  Patient had a feeding tube for a while.  She is here to have it replaced.  It has been out for couple hours.  Patient has a history of diabetes   Abdominal Pain      Home Medications Prior to Admission medications   Medication Sig Start Date End Date Taking? Authorizing Provider  ACCU-CHEK AVIVA PLUS test strip 1 each 4 (four) times daily. 07/03/21   [provider]  acetaminophen (TYLENOL) 160 MG/5ML elixir Take 15.6 mLs (500 mg total) by mouth every 4 (four) hours as needed for fever or pain. 09/25/21   Lucretia Roers, MD  diphenhydrAMINE (BENADRYL) 12.5 MG/5ML elixir Take 12.5 mg by mouth daily as needed.    [provider]  hydroxypropyl methylcellulose / hypromellose (ISOPTO TEARS / GONIOVISC) 2.5 % ophthalmic solution Place 1 drop into both eyes 4 (four) times daily as needed for dry eyes. Patient not taking: Reported on 08/12/2022 04/28/21   Erick Blinks, MD  Nutritional Supplements (FEEDING SUPPLEMENT, OSMOLITE 1.5 CAL,) LIQD Place 1,000 mLs into feeding tube daily. Nocturnal feeds of Osmolite 1.5 @ 105 mL/hr over 12 hrs; START at 8PM and STOP at 8 am. 08/16/22   Lucretia Roers, MD  pantoprazole (PROTONIX) 40 MG tablet Take 1 tablet (40 mg total) by mouth daily. Crush pill and place through feeding tube as educated to prior, flush feeding tube after pill is given 08/25/21   Lucretia Roers, MD  Water For Irrigation, Sterile (FREE WATER) SOLN Place 100 mLs into feeding tube every 4 (four) hours. 08/16/22   Lucretia Roers, MD      Allergies    Cyclobenzaprine hcl    Review of Systems   Review of Systems  Gastrointestinal:  Positive for  abdominal pain.    Physical Exam Updated Vital Signs BP (!) 163/81 (BP Location: Left Arm)   Pulse 85   Temp 98.1 F (36.7 C) (Oral)   Resp 18   Ht 5\' 3"  (1.6 m)   Wt 54 kg   SpO2 95%   BMI 21.08 kg/m  Physical Exam  ED Results / Procedures / Treatments   Labs (all labs ordered are listed, but only abnormal results are displayed) Labs Reviewed - No data to display  EKG None  Radiology DG ABDOMEN PEG TUBE LOCATION  Result Date: 11/06/2022 CLINICAL DATA:  Peg tube placement verification. EXAM: ABDOMEN - 1 VIEW COMPARISON:  08/31/2021. FINDINGS: The bowel gas pattern is normal. A PEG tube is seen in the gastric antrum. Following administration 60 mL Gastrografin, contrast opacifies the gastric lumen and there is no evidence of contrast extravasation. Cholecystectomy clips are present in the right upper quadrant. IMPRESSION: Peg tube terminates within the stomach and there is no evidence of contrast extravasation. Electronically Signed   By: Thornell Sartorius M.D.   On: 11/06/2022 20:52    Procedures Procedures  {Document cardiac monitor, telemetry assessment procedure when appropriate:1}  Medications Ordered in ED Medications  diatrizoate meglumine-sodium (GASTROGRAFIN) 66-10 % solution 60 mL (60 mLs Per Tube Given 11/06/22 2030)    ED Course/ Medical Decision Making/ A&P   {   Click here for ABCD2,  HEART and other calculatorsREFRESH Note before signing :1}                          Medical Decision Making Amount and/or Complexity of Data Reviewed Radiology: ordered.  Risk Prescription drug management.   Patient had replacement of feeding tube  {Document critical care time when appropriate:1} {Document review of labs and clinical decision tools ie heart score, Chads2Vasc2 etc:1}  {Document your independent review of radiology images, and any outside records:1} {Document your discussion with family members, caretakers, and with consultants:1} {Document social  determinants of health affecting pt's care:1} {Document your decision making why or why not admission, treatments were needed:1} Final Clinical Impression(s) / ED Diagnoses Final diagnoses:  PEG tube malfunction (HCC)    Rx / DC Orders ED Discharge Orders     None

## 2022-11-09 DIAGNOSIS — R1311 Dysphagia, oral phase: Secondary | ICD-10-CM | POA: Diagnosis not present

## 2022-12-06 ENCOUNTER — Ambulatory Visit: Payer: Medicare Other | Admitting: Nutrition

## 2022-12-21 ENCOUNTER — Ambulatory Visit: Payer: Medicare Other | Admitting: General Surgery

## 2022-12-21 ENCOUNTER — Encounter: Payer: Self-pay | Admitting: General Surgery

## 2022-12-21 VITALS — BP 116/67 | HR 85 | Temp 98.2°F | Resp 12 | Ht 63.0 in | Wt 119.0 lb

## 2022-12-21 DIAGNOSIS — K9423 Gastrostomy malfunction: Secondary | ICD-10-CM | POA: Diagnosis not present

## 2022-12-21 DIAGNOSIS — Z931 Gastrostomy status: Secondary | ICD-10-CM

## 2022-12-21 NOTE — Patient Instructions (Signed)
Feeds as tolerated. Check and make sure you have the connector for the feeds.  Call the office if you do not.  Keep tube taped down and not moving  Bolster is at 4cm.  Balloon has 10 mL of water in it.

## 2022-12-22 NOTE — Progress Notes (Signed)
[facility] Procedure Note  12/22/22  Pre-procedure Diagnosis: Gastrostomy tube pain and leakage at site    Post-procedure Diagnosis: Same   Procedure(s) Performed: Exchange of gastrostomy tube to 20 french (4cm at the bumper, balloon filled with 10cc water)    Surgeon: Leatrice Jewels. Henreitta Leber, MD   Assistants: No qualified resident was available    Anesthesia: None    Specimens: None    Estimated Blood Loss: Minimal  Wound Class: Contaminated    Procedure Indications: Ms. Kathryn [name] is a 82 yo with longstanding gastrostomy tube. She had this exchanged in the ED in May and this tube has been causing more pain and leakage. She has asked that the tube get changed to see if that helps with these issues. Currently the tube in place is a 18 french tube.   Findings: G tube in place, well defined track, old tube with balloon intact   Procedure: The patient was taken to the procedure room and placed upright in a chair. The gtube site was prepared and in the usual sterile fashion. The old gtube was removed after the balloon was deflated. The new 20 french g tube was placed through the track and popped in without issues. Gastric contents were seen in the tube and the tube flushed well. The balloon was inflated to 10cc of water. The tube was secured in place with gauze and papertape.   Final inspection revealed acceptable hemostasis. The patient tolerated the procedure well.   Feeds as tolerated. Check and make sure you have the connector for the feeds.  Call the office if you do not.  Keep tube taped down and not moving  Bolster is at 4cm.  Balloon has 10 mL of water in it.   Algis Greenhouse, MD [facility] [address] [place], [place] [address] [phone] (office)

## 2022-12-29 ENCOUNTER — Emergency Department (HOSPITAL_COMMUNITY)
Admission: EM | Admit: 2022-12-29 | Discharge: 2022-12-29 | Disposition: A | Discharge status: Home / Self Care | Payer: Medicare Other | Attending: Emergency Medicine | Admitting: Emergency Medicine

## 2022-12-29 ENCOUNTER — Other Ambulatory Visit: Payer: Self-pay

## 2022-12-29 ENCOUNTER — Emergency Department (HOSPITAL_COMMUNITY): Payer: Medicare Other

## 2022-12-29 ENCOUNTER — Encounter (HOSPITAL_COMMUNITY): Payer: Self-pay | Admitting: Emergency Medicine

## 2022-12-29 DIAGNOSIS — M545 Low back pain, unspecified: Secondary | ICD-10-CM | POA: Diagnosis present

## 2022-12-29 DIAGNOSIS — Z79899 Other long term (current) drug therapy: Secondary | ICD-10-CM | POA: Diagnosis not present

## 2022-12-29 DIAGNOSIS — Z4682 Encounter for fitting and adjustment of non-vascular catheter: Secondary | ICD-10-CM | POA: Diagnosis not present

## 2022-12-29 DIAGNOSIS — I10 Essential (primary) hypertension: Secondary | ICD-10-CM | POA: Insufficient documentation

## 2022-12-29 DIAGNOSIS — E119 Type 2 diabetes mellitus without complications: Secondary | ICD-10-CM | POA: Insufficient documentation

## 2022-12-29 DIAGNOSIS — E875 Hyperkalemia: Secondary | ICD-10-CM | POA: Diagnosis not present

## 2022-12-29 DIAGNOSIS — Z931 Gastrostomy status: Secondary | ICD-10-CM | POA: Diagnosis not present

## 2022-12-29 DIAGNOSIS — L02212 Cutaneous abscess of back [any part, except buttock]: Secondary | ICD-10-CM | POA: Diagnosis not present

## 2022-12-29 DIAGNOSIS — M549 Dorsalgia, unspecified: Secondary | ICD-10-CM | POA: Diagnosis not present

## 2022-12-29 DIAGNOSIS — L0291 Cutaneous abscess, unspecified: Secondary | ICD-10-CM

## 2022-12-29 LAB — URINALYSIS, ROUTINE W REFLEX MICROSCOPIC
Bacteria, UA: NONE SEEN
Bilirubin Urine: NEGATIVE
Glucose, UA: NEGATIVE mg/dL
Hgb urine dipstick: NEGATIVE
Ketones, ur: NEGATIVE mg/dL
Leukocytes,Ua: NEGATIVE
Nitrite: NEGATIVE
Protein, ur: 30 mg/dL — AB
Specific Gravity, Urine: 1.016 (ref 1.005–1.030)
pH: 7 (ref 5.0–8.0)

## 2022-12-29 LAB — CBC
HCT: 37.8 % (ref 36.0–46.0)
Hemoglobin: 12.6 g/dL (ref 12.0–15.0)
MCH: 32.6 pg (ref 26.0–34.0)
MCHC: 33.3 g/dL (ref 30.0–36.0)
MCV: 97.9 fL (ref 80.0–100.0)
Platelets: 186 10*3/uL (ref 150–400)
RBC: 3.86 MIL/uL — ABNORMAL LOW (ref 3.87–5.11)
RDW: 13.2 % (ref 11.5–15.5)
WBC: 6.5 10*3/uL (ref 4.0–10.5)
nRBC: 0 % (ref 0.0–0.2)

## 2022-12-29 LAB — BASIC METABOLIC PANEL
Anion gap: 7 (ref 5–15)
BUN: 21 mg/dL (ref 8–23)
CO2: 25 mmol/L (ref 22–32)
Calcium: 9.2 mg/dL (ref 8.9–10.3)
Chloride: 97 mmol/L — ABNORMAL LOW (ref 98–111)
Creatinine, Ser: 0.5 mg/dL (ref 0.44–1.00)
GFR, Estimated: >60 mL/min (ref >60)
Glucose, Bld: 109 mg/dL — ABNORMAL HIGH (ref 70–99)
Potassium: 5.2 mmol/L — ABNORMAL HIGH (ref 3.5–5.1)
Sodium: 129 mmol/L — ABNORMAL LOW (ref 135–145)

## 2022-12-29 MED ORDER — SULFAMETHOXAZOLE-TRIMETHOPRIM 200-40 MG/5ML PO SUSP
20.0000 mL | Freq: Once | ORAL | Status: AC
  Administered 2022-12-29: 20 mL
  Filled 2022-12-29: qty 20

## 2022-12-29 MED ORDER — SULFAMETHOXAZOLE-TRIMETHOPRIM 200-40 MG/5ML PO SUSP: 20.0000 mL | Freq: Two times a day (BID) | ORAL | 0 refills | Status: AC

## 2022-12-29 NOTE — ED Notes (Signed)
Pt had wet brief, pt was changed into a clean diaper, peri care done. Linens and pants remain dry

## 2022-12-29 NOTE — ED Triage Notes (Signed)
Pt via POV c/o back pain and lower belly pain since this morning. Pt has a new feeding tube inserted a week or so ago and reports discomfort at the insertion site. Pt denies urinary symptoms but states she has noticed that a bump has formed in her groin area. Back pain is confined to left flank and family at bedside states pt may have a UTI.

## 2022-12-29 NOTE — ED Notes (Signed)
Burgess Amor, PA-C at the bedside to speak with pt and pt's daughter regarding POC and discharge instructions

## 2022-12-29 NOTE — ED Provider Notes (Signed)
[place] EMERGENCY DEPARTMENT AT [facility] Provider Note   CSN: [id number] Arrival date & time: 12/29/22  1457     History  Chief Complaint  Patient presents with   Back Pain   Pelvic Pain    Kathryn [name] is a 82 y.o. female with a history including diabetes, hypertension, acid reflux, surgical history including longstanding history of G-tube for her main nutrition presenting for evaluation of 2 complaints, she is having some discomfort around the PEG site since this morning.  She had a new tube placed about a week ago and the site around the tube is soft "sore".  She denies leakage from the site and has been able to use the tube without problems.  She also has complaints of pain in her groin, stating there is a "bump" there that has been draining blood for several days.  She also has left lower back pain which sometimes accompanies UTIs per daughter at bedside.  She denies increased urinary frequency, painful urination, has had no fevers or chills, also no nausea or vomiting or other complaints.  The history is provided by the patient.       Home Medications Prior to Admission medications   Medication Sig Start Date End Date Taking? Authorizing Provider  sulfamethoxazole-trimethoprim (BACTRIM) 200-40 MG/5ML suspension Place 20 mLs into feeding tube 2 (two) times daily for 10 days. 12/29/22 01/08/23 Yes Rudolfo Brandow, Raynelle Fanning, PA-C  ACCU-CHEK AVIVA PLUS test strip 1 each 4 (four) times daily. 07/03/21   [provider]  acetaminophen (TYLENOL) 160 MG/5ML elixir Take 15.6 mLs (500 mg total) by mouth every 4 (four) hours as needed for fever or pain. 09/25/21   Lucretia Roers, MD  diphenhydrAMINE (BENADRYL) 12.5 MG/5ML elixir Take 12.5 mg by mouth daily as needed.    [provider]  hydroxypropyl methylcellulose / hypromellose (ISOPTO TEARS / GONIOVISC) 2.5 % ophthalmic solution Place 1 drop into both eyes 4 (four) times daily as needed for dry eyes. Patient not  taking: Reported on 08/12/2022 04/28/21   Erick Blinks, MD  Nutritional Supplements (FEEDING SUPPLEMENT, OSMOLITE 1.5 CAL,) LIQD Place 1,000 mLs into feeding tube daily. Nocturnal feeds of Osmolite 1.5 @ 105 mL/hr over 12 hrs; START at 8PM and STOP at 8 am. 08/16/22   Lucretia Roers, MD  pantoprazole (PROTONIX) 40 MG tablet Take 1 tablet (40 mg total) by mouth daily. Crush pill and place through feeding tube as educated to prior, flush feeding tube after pill is given 08/25/21   Lucretia Roers, MD  Water For Irrigation, Sterile (FREE WATER) SOLN Place 100 mLs into feeding tube every 4 (four) hours. 08/16/22   Lucretia Roers, MD      Allergies    Cyclobenzaprine hcl    Review of Systems   Review of Systems  Constitutional:  Negative for chills and fever.  HENT:  Negative for congestion and sore throat.   Eyes: Negative.   Respiratory:  Negative for chest tightness and shortness of breath.   Cardiovascular:  Negative for chest pain.  Gastrointestinal:  Positive for abdominal pain. Negative for nausea and vomiting.  Genitourinary: Negative.   Musculoskeletal:  Positive for back pain. Negative for arthralgias, joint swelling and neck pain.  Skin:  Positive for wound. Negative for rash.  Neurological:  Negative for dizziness, weakness, light-headedness, numbness and headaches.  Psychiatric/Behavioral: Negative.      Physical Exam Updated Vital Signs BP 137/72   Pulse 93   Temp 98.1 F (36.7  C) (Oral)   Resp 14   Ht 5\' 3"  (1.6 m)   Wt 54 kg   SpO2 99%   BMI 21.08 kg/m  Physical Exam Vitals and nursing note reviewed.  Constitutional:      Appearance: She is well-developed.  HENT:     Head: Normocephalic and atraumatic.  Eyes:     Conjunctiva/sclera: Conjunctivae normal.  Cardiovascular:     Rate and Rhythm: Normal rate and regular rhythm.     Heart sounds: Normal heart sounds.  Pulmonary:     Effort: Pulmonary effort is normal.     Breath sounds: Normal breath  sounds. No wheezing.  Abdominal:     General: Bowel sounds are normal.     Palpations: Abdomen is soft.     Tenderness: There is no abdominal tenderness. There is no right CVA tenderness, left CVA tenderness, guarding or rebound.     Comments: PEG site appears healthy, there is no surrounding erythema, the tube is clean, there is no drainage from the stoma.  Musculoskeletal:        General: Normal range of motion.     Cervical back: Normal range of motion.  Skin:    General: Skin is warm and dry.  Neurological:     Mental Status: She is alert.     ED Results / Procedures / Treatments   Labs (all labs ordered are listed, but only abnormal results are displayed) Labs Reviewed  BASIC METABOLIC PANEL - Abnormal; Notable for the following components:      Result Value   Sodium 129 (*)    Potassium 5.2 (*)    Chloride 97 (*)    Glucose, Bld 109 (*)    All other components within normal limits  CBC - Abnormal; Notable for the following components:   RBC 3.86 (*)    All other components within normal limits  URINALYSIS, ROUTINE W REFLEX MICROSCOPIC - Abnormal; Notable for the following components:   Protein, ur 30 (*)    All other components within normal limits    EKG EKG Interpretation Date/Time:  Wednesday December 29 2022 21:37:34 EDT Ventricular Rate:  96 PR Interval:  137 QRS Duration:  126 QT Interval:  365 QTC Calculation: 462 R Axis:   118  Text Interpretation: Sinus rhythm RBBB and LPFB Interpretation limited secondary to artifact Confirmed by Vanetta Mulders [phone]) on 12/29/2022 9:46:56 PM  Radiology No results found.  Procedures Procedures    Medications Ordered in ED Medications  sulfamethoxazole-trimethoprim (BACTRIM) 200-40 MG/5ML suspension 20 mL (20 mLs Per Tube Given 12/29/22 2128)    ED Course/ Medical Decision Making/ A&P                             Medical Decision Making Patient presented with multiple complaints, discomfort around her PEG tube  site which appears to be healthy, no skin infection, it is flushing well which was confirmed with Gastrografin imaging.  She does have an abscess at her left labia majora, there is mild induration, no fluctuance, it is actively spontaneously draining.  Patient is started on antibiotics for this abscess site.  Encouraged continued warm soaks which she had been doing.  Urinalysis is completed here with no sign of infection.  CBC and be met were reviewed, she does have a borderline hyperkalemia at 5.2.  EKG reviewed and without changes.  Discussed close follow-up with primary provider for recheck if infection is not improving, started  on Septra suspension per tube.  Amount and/or Complexity of Data Reviewed Labs: ordered.    Details: Per above. Radiology: ordered.  Risk Prescription drug management.           Final Clinical Impression(s) / ED Diagnoses Final diagnoses:  Abscess    Rx / DC Orders ED Discharge Orders          Ordered    sulfamethoxazole-trimethoprim (BACTRIM) 200-40 MG/5ML suspension  2 times daily        12/29/22 2218              Burgess Amor, PA-C 01/01/23 1345    Vanetta Mulders, MD 01/06/23 2245

## 2022-12-29 NOTE — Discharge Instructions (Addendum)
Take the entire course of the antibiotics prescribed, your next dose tomorrow morning.  You are feeding tube appears to be flushing well today.  I do recommend warm Epsom salt soaks to the site of your skin abscess in your groin several times daily for 10 to 15 minutes to help curds this to keep draining.  Plan a recheck if the antibiotics and this treatment does not completely eliminate the skin infection.

## 2022-12-31 DIAGNOSIS — E8809 Other disorders of plasma-protein metabolism, not elsewhere classified: Secondary | ICD-10-CM | POA: Diagnosis not present

## 2022-12-31 DIAGNOSIS — E43 Unspecified severe protein-calorie malnutrition: Secondary | ICD-10-CM | POA: Diagnosis not present

## 2022-12-31 DIAGNOSIS — R634 Abnormal weight loss: Secondary | ICD-10-CM | POA: Diagnosis not present

## 2022-12-31 DIAGNOSIS — E871 Hypo-osmolality and hyponatremia: Secondary | ICD-10-CM | POA: Diagnosis not present

## 2022-12-31 DIAGNOSIS — R1310 Dysphagia, unspecified: Secondary | ICD-10-CM | POA: Diagnosis not present

## 2023-01-04 ENCOUNTER — Telehealth: Payer: Self-pay | Admitting: *Deleted

## 2023-01-04 NOTE — Telephone Encounter (Signed)
Transition Care Management Unsuccessful Follow-up Telephone Call  Date of discharge and from where:  [place] 7/3  Attempts:  1st Attempt  Reason for unsuccessful TCM follow-up call:  Left voice message

## 2023-01-05 ENCOUNTER — Telehealth: Payer: Self-pay | Admitting: *Deleted

## 2023-01-05 NOTE — Telephone Encounter (Signed)
Transition Care Management Unsuccessful Follow-up Telephone Call  Date of discharge and from where:  [name] 12[facility]  Attempts:  2nd Attempt  Reason for unsuccessful TCM follow-up call:  No answer/busy

## 2023-01-26 DIAGNOSIS — R5383 Other fatigue: Secondary | ICD-10-CM | POA: Diagnosis not present

## 2023-01-26 DIAGNOSIS — F32A Depression, unspecified: Secondary | ICD-10-CM | POA: Diagnosis not present

## 2023-01-26 DIAGNOSIS — L600 Ingrowing nail: Secondary | ICD-10-CM | POA: Diagnosis not present

## 2023-01-31 DIAGNOSIS — E8809 Other disorders of plasma-protein metabolism, not elsewhere classified: Secondary | ICD-10-CM | POA: Diagnosis not present

## 2023-01-31 DIAGNOSIS — R1310 Dysphagia, unspecified: Secondary | ICD-10-CM | POA: Diagnosis not present

## 2023-01-31 DIAGNOSIS — E43 Unspecified severe protein-calorie malnutrition: Secondary | ICD-10-CM | POA: Diagnosis not present

## 2023-01-31 DIAGNOSIS — R634 Abnormal weight loss: Secondary | ICD-10-CM | POA: Diagnosis not present

## 2023-01-31 DIAGNOSIS — E871 Hypo-osmolality and hyponatremia: Secondary | ICD-10-CM | POA: Diagnosis not present

## 2023-02-07 ENCOUNTER — Ambulatory Visit: Payer: Medicare Other | Admitting: Nutrition

## 2023-02-21 ENCOUNTER — Ambulatory Visit: Payer: Medicare Other | Admitting: Nutrition

## 2023-03-07 DIAGNOSIS — R1310 Dysphagia, unspecified: Secondary | ICD-10-CM | POA: Diagnosis not present

## 2023-03-07 DIAGNOSIS — E871 Hypo-osmolality and hyponatremia: Secondary | ICD-10-CM | POA: Diagnosis not present

## 2023-03-07 DIAGNOSIS — E43 Unspecified severe protein-calorie malnutrition: Secondary | ICD-10-CM | POA: Diagnosis not present

## 2023-03-07 DIAGNOSIS — E8809 Other disorders of plasma-protein metabolism, not elsewhere classified: Secondary | ICD-10-CM | POA: Diagnosis not present

## 2023-03-07 DIAGNOSIS — R634 Abnormal weight loss: Secondary | ICD-10-CM | POA: Diagnosis not present

## 2023-04-05 DIAGNOSIS — E8809 Other disorders of plasma-protein metabolism, not elsewhere classified: Secondary | ICD-10-CM | POA: Diagnosis not present

## 2023-04-05 DIAGNOSIS — R1310 Dysphagia, unspecified: Secondary | ICD-10-CM | POA: Diagnosis not present

## 2023-04-05 DIAGNOSIS — R634 Abnormal weight loss: Secondary | ICD-10-CM | POA: Diagnosis not present

## 2023-04-05 DIAGNOSIS — E43 Unspecified severe protein-calorie malnutrition: Secondary | ICD-10-CM | POA: Diagnosis not present

## 2023-04-05 DIAGNOSIS — E871 Hypo-osmolality and hyponatremia: Secondary | ICD-10-CM | POA: Diagnosis not present

## 2023-05-04 DIAGNOSIS — E43 Unspecified severe protein-calorie malnutrition: Secondary | ICD-10-CM | POA: Diagnosis not present

## 2023-05-04 DIAGNOSIS — E871 Hypo-osmolality and hyponatremia: Secondary | ICD-10-CM | POA: Diagnosis not present

## 2023-05-04 DIAGNOSIS — E8809 Other disorders of plasma-protein metabolism, not elsewhere classified: Secondary | ICD-10-CM | POA: Diagnosis not present

## 2023-05-04 DIAGNOSIS — R1310 Dysphagia, unspecified: Secondary | ICD-10-CM | POA: Diagnosis not present

## 2023-05-04 DIAGNOSIS — R634 Abnormal weight loss: Secondary | ICD-10-CM | POA: Diagnosis not present

## 2023-05-11 DIAGNOSIS — E8809 Other disorders of plasma-protein metabolism, not elsewhere classified: Secondary | ICD-10-CM | POA: Diagnosis not present

## 2023-05-11 DIAGNOSIS — E43 Unspecified severe protein-calorie malnutrition: Secondary | ICD-10-CM | POA: Diagnosis not present

## 2023-05-11 DIAGNOSIS — R634 Abnormal weight loss: Secondary | ICD-10-CM | POA: Diagnosis not present

## 2023-05-11 DIAGNOSIS — E871 Hypo-osmolality and hyponatremia: Secondary | ICD-10-CM | POA: Diagnosis not present

## 2023-05-11 DIAGNOSIS — R1310 Dysphagia, unspecified: Secondary | ICD-10-CM | POA: Diagnosis not present

## 2023-06-08 DIAGNOSIS — E43 Unspecified severe protein-calorie malnutrition: Secondary | ICD-10-CM | POA: Diagnosis not present

## 2023-06-08 DIAGNOSIS — R634 Abnormal weight loss: Secondary | ICD-10-CM | POA: Diagnosis not present

## 2023-06-08 DIAGNOSIS — R1310 Dysphagia, unspecified: Secondary | ICD-10-CM | POA: Diagnosis not present

## 2023-06-08 DIAGNOSIS — E871 Hypo-osmolality and hyponatremia: Secondary | ICD-10-CM | POA: Diagnosis not present

## 2023-06-08 DIAGNOSIS — E8809 Other disorders of plasma-protein metabolism, not elsewhere classified: Secondary | ICD-10-CM | POA: Diagnosis not present

## 2023-06-29 DIAGNOSIS — R1310 Dysphagia, unspecified: Secondary | ICD-10-CM | POA: Diagnosis not present

## 2023-06-29 DIAGNOSIS — E871 Hypo-osmolality and hyponatremia: Secondary | ICD-10-CM | POA: Diagnosis not present

## 2023-06-29 DIAGNOSIS — E43 Unspecified severe protein-calorie malnutrition: Secondary | ICD-10-CM | POA: Diagnosis not present

## 2023-06-29 DIAGNOSIS — E8809 Other disorders of plasma-protein metabolism, not elsewhere classified: Secondary | ICD-10-CM | POA: Diagnosis not present

## 2023-06-29 DIAGNOSIS — R634 Abnormal weight loss: Secondary | ICD-10-CM | POA: Diagnosis not present

## 2023-07-07 DIAGNOSIS — R1310 Dysphagia, unspecified: Secondary | ICD-10-CM | POA: Diagnosis not present

## 2023-07-07 DIAGNOSIS — E871 Hypo-osmolality and hyponatremia: Secondary | ICD-10-CM | POA: Diagnosis not present

## 2023-07-07 DIAGNOSIS — R634 Abnormal weight loss: Secondary | ICD-10-CM | POA: Diagnosis not present

## 2023-07-07 DIAGNOSIS — E43 Unspecified severe protein-calorie malnutrition: Secondary | ICD-10-CM | POA: Diagnosis not present

## 2023-07-07 DIAGNOSIS — E8809 Other disorders of plasma-protein metabolism, not elsewhere classified: Secondary | ICD-10-CM | POA: Diagnosis not present

## 2023-08-15 DIAGNOSIS — E43 Unspecified severe protein-calorie malnutrition: Secondary | ICD-10-CM | POA: Diagnosis not present

## 2023-08-15 DIAGNOSIS — R634 Abnormal weight loss: Secondary | ICD-10-CM | POA: Diagnosis not present

## 2023-08-15 DIAGNOSIS — E8809 Other disorders of plasma-protein metabolism, not elsewhere classified: Secondary | ICD-10-CM | POA: Diagnosis not present

## 2023-08-15 DIAGNOSIS — R1310 Dysphagia, unspecified: Secondary | ICD-10-CM | POA: Diagnosis not present

## 2023-08-15 DIAGNOSIS — E871 Hypo-osmolality and hyponatremia: Secondary | ICD-10-CM | POA: Diagnosis not present

## 2023-09-20 DIAGNOSIS — E8809 Other disorders of plasma-protein metabolism, not elsewhere classified: Secondary | ICD-10-CM | POA: Diagnosis not present

## 2023-09-20 DIAGNOSIS — R1310 Dysphagia, unspecified: Secondary | ICD-10-CM | POA: Diagnosis not present

## 2023-09-20 DIAGNOSIS — R634 Abnormal weight loss: Secondary | ICD-10-CM | POA: Diagnosis not present

## 2023-09-20 DIAGNOSIS — E871 Hypo-osmolality and hyponatremia: Secondary | ICD-10-CM | POA: Diagnosis not present

## 2023-09-20 DIAGNOSIS — E43 Unspecified severe protein-calorie malnutrition: Secondary | ICD-10-CM | POA: Diagnosis not present

## 2023-10-09 DIAGNOSIS — K9422 Gastrostomy infection: Secondary | ICD-10-CM | POA: Diagnosis not present

## 2023-10-09 DIAGNOSIS — R350 Frequency of micturition: Secondary | ICD-10-CM | POA: Diagnosis not present

## 2023-10-09 DIAGNOSIS — E119 Type 2 diabetes mellitus without complications: Secondary | ICD-10-CM | POA: Diagnosis not present

## 2023-10-09 DIAGNOSIS — I10 Essential (primary) hypertension: Secondary | ICD-10-CM | POA: Diagnosis not present

## 2023-10-09 DIAGNOSIS — N289 Disorder of kidney and ureter, unspecified: Secondary | ICD-10-CM | POA: Diagnosis not present

## 2023-10-09 DIAGNOSIS — Z931 Gastrostomy status: Secondary | ICD-10-CM | POA: Diagnosis not present

## 2023-10-09 DIAGNOSIS — L02211 Cutaneous abscess of abdominal wall: Secondary | ICD-10-CM | POA: Diagnosis not present

## 2023-10-09 DIAGNOSIS — R5383 Other fatigue: Secondary | ICD-10-CM | POA: Diagnosis not present

## 2023-10-09 DIAGNOSIS — R6889 Other general symptoms and signs: Secondary | ICD-10-CM | POA: Diagnosis not present

## 2023-10-09 DIAGNOSIS — E43 Unspecified severe protein-calorie malnutrition: Secondary | ICD-10-CM | POA: Diagnosis not present

## 2023-10-09 DIAGNOSIS — Z743 Need for continuous supervision: Secondary | ICD-10-CM | POA: Diagnosis not present

## 2023-10-09 DIAGNOSIS — M6281 Muscle weakness (generalized): Secondary | ICD-10-CM | POA: Diagnosis not present

## 2023-10-09 DIAGNOSIS — K6389 Other specified diseases of intestine: Secondary | ICD-10-CM | POA: Diagnosis not present

## 2023-10-09 DIAGNOSIS — I499 Cardiac arrhythmia, unspecified: Secondary | ICD-10-CM | POA: Diagnosis not present

## 2023-10-09 DIAGNOSIS — R109 Unspecified abdominal pain: Secondary | ICD-10-CM | POA: Diagnosis not present

## 2023-10-09 DIAGNOSIS — N3289 Other specified disorders of bladder: Secondary | ICD-10-CM | POA: Diagnosis not present

## 2023-10-09 DIAGNOSIS — R2681 Unsteadiness on feet: Secondary | ICD-10-CM | POA: Diagnosis not present

## 2023-10-09 DIAGNOSIS — K529 Noninfective gastroenteritis and colitis, unspecified: Secondary | ICD-10-CM | POA: Diagnosis not present

## 2023-10-09 DIAGNOSIS — K219 Gastro-esophageal reflux disease without esophagitis: Secondary | ICD-10-CM | POA: Diagnosis not present

## 2023-10-09 DIAGNOSIS — E871 Hypo-osmolality and hyponatremia: Secondary | ICD-10-CM | POA: Diagnosis not present

## 2023-10-09 DIAGNOSIS — Z79899 Other long term (current) drug therapy: Secondary | ICD-10-CM | POA: Diagnosis not present

## 2023-10-09 DIAGNOSIS — Z9049 Acquired absence of other specified parts of digestive tract: Secondary | ICD-10-CM | POA: Diagnosis not present

## 2023-10-09 DIAGNOSIS — K220 Achalasia of cardia: Secondary | ICD-10-CM | POA: Diagnosis not present

## 2023-10-10 DIAGNOSIS — Z79899 Other long term (current) drug therapy: Secondary | ICD-10-CM | POA: Diagnosis not present

## 2023-10-10 DIAGNOSIS — E119 Type 2 diabetes mellitus without complications: Secondary | ICD-10-CM | POA: Diagnosis not present

## 2023-10-10 DIAGNOSIS — Z931 Gastrostomy status: Secondary | ICD-10-CM | POA: Diagnosis not present

## 2023-10-10 DIAGNOSIS — E43 Unspecified severe protein-calorie malnutrition: Secondary | ICD-10-CM | POA: Diagnosis not present

## 2023-10-10 DIAGNOSIS — E871 Hypo-osmolality and hyponatremia: Secondary | ICD-10-CM | POA: Diagnosis not present

## 2023-10-10 DIAGNOSIS — L02211 Cutaneous abscess of abdominal wall: Secondary | ICD-10-CM | POA: Diagnosis not present

## 2023-10-12 DIAGNOSIS — L02211 Cutaneous abscess of abdominal wall: Secondary | ICD-10-CM | POA: Diagnosis not present

## 2023-10-12 DIAGNOSIS — E871 Hypo-osmolality and hyponatremia: Secondary | ICD-10-CM | POA: Diagnosis not present

## 2023-10-12 DIAGNOSIS — Z556 Problems related to health literacy: Secondary | ICD-10-CM | POA: Diagnosis not present

## 2023-10-12 DIAGNOSIS — K219 Gastro-esophageal reflux disease without esophagitis: Secondary | ICD-10-CM | POA: Diagnosis not present

## 2023-10-12 DIAGNOSIS — I10 Essential (primary) hypertension: Secondary | ICD-10-CM | POA: Diagnosis not present

## 2023-10-12 DIAGNOSIS — K220 Achalasia of cardia: Secondary | ICD-10-CM | POA: Diagnosis not present

## 2023-10-12 DIAGNOSIS — E119 Type 2 diabetes mellitus without complications: Secondary | ICD-10-CM | POA: Diagnosis not present

## 2023-10-12 DIAGNOSIS — K9422 Gastrostomy infection: Secondary | ICD-10-CM | POA: Diagnosis not present

## 2023-10-12 DIAGNOSIS — E43 Unspecified severe protein-calorie malnutrition: Secondary | ICD-10-CM | POA: Diagnosis not present

## 2023-10-12 DIAGNOSIS — L02221 Furuncle of abdominal wall: Secondary | ICD-10-CM | POA: Diagnosis not present

## 2023-10-14 DIAGNOSIS — Z556 Problems related to health literacy: Secondary | ICD-10-CM | POA: Diagnosis not present

## 2023-10-14 DIAGNOSIS — E43 Unspecified severe protein-calorie malnutrition: Secondary | ICD-10-CM | POA: Diagnosis not present

## 2023-10-14 DIAGNOSIS — E871 Hypo-osmolality and hyponatremia: Secondary | ICD-10-CM | POA: Diagnosis not present

## 2023-10-14 DIAGNOSIS — K9422 Gastrostomy infection: Secondary | ICD-10-CM | POA: Diagnosis not present

## 2023-10-14 DIAGNOSIS — K219 Gastro-esophageal reflux disease without esophagitis: Secondary | ICD-10-CM | POA: Diagnosis not present

## 2023-10-14 DIAGNOSIS — L02221 Furuncle of abdominal wall: Secondary | ICD-10-CM | POA: Diagnosis not present

## 2023-10-14 DIAGNOSIS — K220 Achalasia of cardia: Secondary | ICD-10-CM | POA: Diagnosis not present

## 2023-10-14 DIAGNOSIS — L02211 Cutaneous abscess of abdominal wall: Secondary | ICD-10-CM | POA: Diagnosis not present

## 2023-10-14 DIAGNOSIS — E119 Type 2 diabetes mellitus without complications: Secondary | ICD-10-CM | POA: Diagnosis not present

## 2023-10-14 DIAGNOSIS — I10 Essential (primary) hypertension: Secondary | ICD-10-CM | POA: Diagnosis not present

## 2023-10-18 ENCOUNTER — Emergency Department (HOSPITAL_COMMUNITY)

## 2023-10-18 ENCOUNTER — Encounter (HOSPITAL_COMMUNITY): Payer: Self-pay

## 2023-10-18 ENCOUNTER — Other Ambulatory Visit: Payer: Self-pay

## 2023-10-18 ENCOUNTER — Emergency Department (HOSPITAL_COMMUNITY): Admission: EM | Admit: 2023-10-18 | Discharge: 2023-10-18 | Disposition: A | Discharge status: Home / Self Care

## 2023-10-18 DIAGNOSIS — R29810 Facial weakness: Secondary | ICD-10-CM | POA: Diagnosis not present

## 2023-10-18 DIAGNOSIS — K219 Gastro-esophageal reflux disease without esophagitis: Secondary | ICD-10-CM | POA: Diagnosis not present

## 2023-10-18 DIAGNOSIS — E871 Hypo-osmolality and hyponatremia: Secondary | ICD-10-CM | POA: Diagnosis not present

## 2023-10-18 DIAGNOSIS — I10 Essential (primary) hypertension: Secondary | ICD-10-CM | POA: Diagnosis not present

## 2023-10-18 DIAGNOSIS — L02211 Cutaneous abscess of abdominal wall: Secondary | ICD-10-CM | POA: Diagnosis not present

## 2023-10-18 DIAGNOSIS — K9423 Gastrostomy malfunction: Secondary | ICD-10-CM | POA: Diagnosis not present

## 2023-10-18 DIAGNOSIS — E43 Unspecified severe protein-calorie malnutrition: Secondary | ICD-10-CM | POA: Diagnosis not present

## 2023-10-18 DIAGNOSIS — Z743 Need for continuous supervision: Secondary | ICD-10-CM | POA: Diagnosis not present

## 2023-10-18 DIAGNOSIS — E119 Type 2 diabetes mellitus without complications: Secondary | ICD-10-CM | POA: Diagnosis not present

## 2023-10-18 DIAGNOSIS — R6889 Other general symptoms and signs: Secondary | ICD-10-CM | POA: Diagnosis not present

## 2023-10-18 DIAGNOSIS — Z556 Problems related to health literacy: Secondary | ICD-10-CM | POA: Diagnosis not present

## 2023-10-18 DIAGNOSIS — Z4682 Encounter for fitting and adjustment of non-vascular catheter: Secondary | ICD-10-CM | POA: Diagnosis not present

## 2023-10-18 DIAGNOSIS — K9422 Gastrostomy infection: Secondary | ICD-10-CM | POA: Diagnosis not present

## 2023-10-18 DIAGNOSIS — L02221 Furuncle of abdominal wall: Secondary | ICD-10-CM | POA: Diagnosis not present

## 2023-10-18 DIAGNOSIS — K220 Achalasia of cardia: Secondary | ICD-10-CM | POA: Diagnosis not present

## 2023-10-18 MED ORDER — DIATRIZOATE MEGLUMINE & SODIUM 66-10 % PO SOLN
60.0000 mL | Freq: Once | ORAL | Status: AC
  Administered 2023-10-18: 60 mL
  Filled 2023-10-18: qty 60

## 2023-10-18 NOTE — Discharge Instructions (Signed)
 Please follow-up closely with primary care doctor on an outpatient basis.  Return to emergency department immediately for any new or worsening symptoms.

## 2023-10-18 NOTE — ED Provider Notes (Signed)
 [place] [facility] Provider Note   CSN: [id number] Arrival date & time: 10/18/23  1615     History  Chief Complaint  Patient presents with   Feeding Tube Placement    Kathryn [name] is a 83 y.o. female.  Patient is an 83 year old female who presents to the emergency department with her family member secondary to PEG tube displacement.  Patient is unsure of exactly how Rasor the PEG tube has been out for.  Patient does present with the PEG tube with her.  Patient denies any active pain at this time.  Family member does note that the patient had an abscess recently drained near the PEG tube site but has been doing well with that and pain has improved.        Home Medications Prior to Admission medications   Medication Sig Start Date End Date Taking? Authorizing Provider  ACCU-CHEK AVIVA PLUS test strip 1 each 4 (four) times daily. 07/03/21   [provider]  acetaminophen  (TYLENOL ) 160 MG/5ML elixir Take 15.6 mLs (500 mg total) by mouth every 4 (four) hours as needed for fever or pain. 09/25/21   Awilda Bogus, MD  diphenhydrAMINE  (BENADRYL ) 12.5 MG/5ML elixir Take 12.5 mg by mouth daily as needed.    [provider]  hydroxypropyl methylcellulose / hypromellose (ISOPTO TEARS / GONIOVISC) 2.5 % ophthalmic solution Place 1 drop into both eyes 4 (four) times daily as needed for dry eyes. Patient not taking: Reported on 08/12/2022 04/28/21   Gwendalyn Lemma, MD  Nutritional Supplements (FEEDING SUPPLEMENT, OSMOLITE 1.5 CAL,) LIQD Place 1,000 mLs into feeding tube daily. Nocturnal feeds of Osmolite 1.5 @ 105 mL/hr over 12 hrs; START at 8PM and STOP at 8 am. 08/16/22   Awilda Bogus, MD  pantoprazole  (PROTONIX ) 40 MG tablet Take 1 tablet (40 mg total) by mouth daily. Crush pill and place through feeding tube as educated to prior, flush feeding tube after pill is given 08/25/21   Awilda Bogus, MD  Water  For Irrigation, Sterile  (FREE WATER ) SOLN Place 100 mLs into feeding tube every 4 (four) hours. 08/16/22   Awilda Bogus, MD      Allergies    Cyclobenzaprine hcl    Review of Systems   Review of Systems  Gastrointestinal:        PEG tube displacement  All other systems reviewed and are negative.   Physical Exam Updated Vital Signs BP (!) 178/83 (BP Location: Right Arm)   Pulse 88   Temp 98.3 F (36.8 C) (Oral)   Resp 18   Ht 5\' 3"  (1.6 m)   Wt 54 kg   SpO2 99%   BMI 21.09 kg/m  Physical Exam Vitals and nursing note reviewed.  Constitutional:      Appearance: Normal appearance.  HENT:     Head: Normocephalic and atraumatic.     Nose: Nose normal.     Mouth/Throat:     Mouth: Mucous membranes are moist.  Eyes:     Extraocular Movements: Extraocular movements intact.     Conjunctiva/sclera: Conjunctivae normal.     Pupils: Pupils are equal, round, and reactive to light.  Cardiovascular:     Rate and Rhythm: Normal rate and regular rhythm.     Pulses: Normal pulses.     Heart sounds: Normal heart sounds.  Pulmonary:     Effort: Pulmonary effort is normal.     Breath sounds: Normal breath sounds.  Abdominal:  General: Abdomen is flat. Bowel sounds are normal. There is no distension.     Palpations: Abdomen is soft.     Tenderness: There is no abdominal tenderness. There is no guarding.     Comments: PEG tube site with no surrounding erythema, discharge, bleeding  Musculoskeletal:        General: Normal range of motion.     Cervical back: Normal range of motion and neck supple.  Skin:    General: Skin is warm and dry.  Neurological:     General: No focal deficit present.     Mental Status: She is alert and oriented to person, place, and time. Mental status is at baseline.  Psychiatric:        Mood and Affect: Mood normal.        Behavior: Behavior normal.        Thought Content: Thought content normal.        Judgment: Judgment normal.     ED Results / Procedures /  Treatments   Labs (all labs ordered are listed, but only abnormal results are displayed) Labs Reviewed - No data to display  EKG None  Radiology No results found.  Procedures .Gastrostomy tube replacement  Date/Time: 10/18/2023 4:55 PM  Performed by: Roselynn Connors, PA-C Authorized by: Roselynn Connors, PA-C  Relevant documents: relevant documents present and verified Site marked: the operative site was marked Patient identity confirmed: verbally with patient, arm band, provided demographic data and hospital-assigned identification number Time out: Immediately prior to procedure a "time out" was called to verify the correct patient, procedure, equipment, support staff and site/side marked as required. Preparation: Patient was prepped and draped in the usual sterile fashion. Local anesthesia used: no  Anesthesia: Local anesthesia used: no  Sedation: Patient sedated: no  Patient tolerance: patient tolerated the procedure well with no immediate complications Comments: PEG tube replaced with 20 [place] G-tube       Medications Ordered in ED Medications - No data to display  ED Course/ Medical Decision Making/ A&P                                 Medical Decision Making Patient is doing well at this time and is stable for discharge home.  PEG tube was replaced by myself in the emergency department.  Patient has no indication for surrounding cellulitis or abscess remission at this point.  Post replacement x-ray demonstrated proper placement of the PEG tube and Gastrografin  within the stomach.  Patient has no other acute complaints at this point.  Vital signs are otherwise stable.  Patient will follow-up closely on an outpatient basis.  Strict return precautions were provided for any new or worsening symptoms.  Patient and family voiced understanding to the plan and had no additional questions. Patient was evaluated by attending physician as well who is in agreement to  plan.   Amount and/or Complexity of Data Reviewed Radiology: ordered.  Risk Prescription drug management.           Final Clinical Impression(s) / ED Diagnoses Final diagnoses:  None    Rx / DC Orders ED Discharge Orders     None         Kathryn [name] 10/18/23 1745    Carin Charleston, MD 10/19/23 0040

## 2023-10-18 NOTE — ED Triage Notes (Signed)
 Pt BIB ems from home for feeding tube needing replaced. Pt denies any pain at this time.

## 2023-10-24 DIAGNOSIS — L02221 Furuncle of abdominal wall: Secondary | ICD-10-CM | POA: Diagnosis not present

## 2023-10-24 DIAGNOSIS — L02211 Cutaneous abscess of abdominal wall: Secondary | ICD-10-CM | POA: Diagnosis not present

## 2023-10-24 DIAGNOSIS — K9422 Gastrostomy infection: Secondary | ICD-10-CM | POA: Diagnosis not present

## 2023-10-24 DIAGNOSIS — E43 Unspecified severe protein-calorie malnutrition: Secondary | ICD-10-CM | POA: Diagnosis not present

## 2023-10-24 DIAGNOSIS — I10 Essential (primary) hypertension: Secondary | ICD-10-CM | POA: Diagnosis not present

## 2023-10-24 DIAGNOSIS — K220 Achalasia of cardia: Secondary | ICD-10-CM | POA: Diagnosis not present

## 2023-10-24 DIAGNOSIS — Z556 Problems related to health literacy: Secondary | ICD-10-CM | POA: Diagnosis not present

## 2023-10-24 DIAGNOSIS — K219 Gastro-esophageal reflux disease without esophagitis: Secondary | ICD-10-CM | POA: Diagnosis not present

## 2023-10-24 DIAGNOSIS — E119 Type 2 diabetes mellitus without complications: Secondary | ICD-10-CM | POA: Diagnosis not present

## 2023-10-24 DIAGNOSIS — E871 Hypo-osmolality and hyponatremia: Secondary | ICD-10-CM | POA: Diagnosis not present

## 2023-10-28 DIAGNOSIS — L02211 Cutaneous abscess of abdominal wall: Secondary | ICD-10-CM | POA: Diagnosis not present

## 2023-10-28 DIAGNOSIS — K219 Gastro-esophageal reflux disease without esophagitis: Secondary | ICD-10-CM | POA: Diagnosis not present

## 2023-10-28 DIAGNOSIS — R1314 Dysphagia, pharyngoesophageal phase: Secondary | ICD-10-CM | POA: Diagnosis not present

## 2023-10-28 DIAGNOSIS — L02221 Furuncle of abdominal wall: Secondary | ICD-10-CM | POA: Diagnosis not present

## 2023-10-28 DIAGNOSIS — I10 Essential (primary) hypertension: Secondary | ICD-10-CM | POA: Diagnosis not present

## 2023-10-28 DIAGNOSIS — E119 Type 2 diabetes mellitus without complications: Secondary | ICD-10-CM | POA: Diagnosis not present

## 2023-10-28 DIAGNOSIS — K9422 Gastrostomy infection: Secondary | ICD-10-CM | POA: Diagnosis not present

## 2023-10-28 DIAGNOSIS — K220 Achalasia of cardia: Secondary | ICD-10-CM | POA: Diagnosis not present

## 2023-10-28 DIAGNOSIS — E43 Unspecified severe protein-calorie malnutrition: Secondary | ICD-10-CM | POA: Diagnosis not present

## 2023-10-28 DIAGNOSIS — L608 Other nail disorders: Secondary | ICD-10-CM | POA: Diagnosis not present

## 2023-10-28 DIAGNOSIS — Z556 Problems related to health literacy: Secondary | ICD-10-CM | POA: Diagnosis not present

## 2023-10-28 DIAGNOSIS — Z931 Gastrostomy status: Secondary | ICD-10-CM | POA: Diagnosis not present

## 2023-10-28 DIAGNOSIS — Z7689 Persons encountering health services in other specified circumstances: Secondary | ICD-10-CM | POA: Diagnosis not present

## 2023-10-28 DIAGNOSIS — E871 Hypo-osmolality and hyponatremia: Secondary | ICD-10-CM | POA: Diagnosis not present

## 2023-10-29 DIAGNOSIS — E8809 Other disorders of plasma-protein metabolism, not elsewhere classified: Secondary | ICD-10-CM | POA: Diagnosis not present

## 2023-10-29 DIAGNOSIS — E871 Hypo-osmolality and hyponatremia: Secondary | ICD-10-CM | POA: Diagnosis not present

## 2023-10-29 DIAGNOSIS — R1310 Dysphagia, unspecified: Secondary | ICD-10-CM | POA: Diagnosis not present

## 2023-10-29 DIAGNOSIS — E43 Unspecified severe protein-calorie malnutrition: Secondary | ICD-10-CM | POA: Diagnosis not present

## 2023-10-29 DIAGNOSIS — R634 Abnormal weight loss: Secondary | ICD-10-CM | POA: Diagnosis not present

## 2023-11-01 DIAGNOSIS — E119 Type 2 diabetes mellitus without complications: Secondary | ICD-10-CM | POA: Diagnosis not present

## 2023-11-01 DIAGNOSIS — E871 Hypo-osmolality and hyponatremia: Secondary | ICD-10-CM | POA: Diagnosis not present

## 2023-11-01 DIAGNOSIS — I10 Essential (primary) hypertension: Secondary | ICD-10-CM | POA: Diagnosis not present

## 2023-11-01 DIAGNOSIS — K9422 Gastrostomy infection: Secondary | ICD-10-CM | POA: Diagnosis not present

## 2023-11-01 DIAGNOSIS — K220 Achalasia of cardia: Secondary | ICD-10-CM | POA: Diagnosis not present

## 2023-11-01 DIAGNOSIS — K219 Gastro-esophageal reflux disease without esophagitis: Secondary | ICD-10-CM | POA: Diagnosis not present

## 2023-11-01 DIAGNOSIS — L02211 Cutaneous abscess of abdominal wall: Secondary | ICD-10-CM | POA: Diagnosis not present

## 2023-11-01 DIAGNOSIS — E43 Unspecified severe protein-calorie malnutrition: Secondary | ICD-10-CM | POA: Diagnosis not present

## 2023-11-01 DIAGNOSIS — Z556 Problems related to health literacy: Secondary | ICD-10-CM | POA: Diagnosis not present

## 2023-11-01 DIAGNOSIS — L02221 Furuncle of abdominal wall: Secondary | ICD-10-CM | POA: Diagnosis not present

## 2023-11-02 DIAGNOSIS — E119 Type 2 diabetes mellitus without complications: Secondary | ICD-10-CM | POA: Diagnosis not present

## 2023-11-02 DIAGNOSIS — L02211 Cutaneous abscess of abdominal wall: Secondary | ICD-10-CM | POA: Diagnosis not present

## 2023-11-02 DIAGNOSIS — K219 Gastro-esophageal reflux disease without esophagitis: Secondary | ICD-10-CM | POA: Diagnosis not present

## 2023-11-02 DIAGNOSIS — I10 Essential (primary) hypertension: Secondary | ICD-10-CM | POA: Diagnosis not present

## 2023-11-02 DIAGNOSIS — E871 Hypo-osmolality and hyponatremia: Secondary | ICD-10-CM | POA: Diagnosis not present

## 2023-11-02 DIAGNOSIS — L02221 Furuncle of abdominal wall: Secondary | ICD-10-CM | POA: Diagnosis not present

## 2023-11-02 DIAGNOSIS — K9422 Gastrostomy infection: Secondary | ICD-10-CM | POA: Diagnosis not present

## 2023-11-02 DIAGNOSIS — E43 Unspecified severe protein-calorie malnutrition: Secondary | ICD-10-CM | POA: Diagnosis not present

## 2023-11-02 DIAGNOSIS — Z556 Problems related to health literacy: Secondary | ICD-10-CM | POA: Diagnosis not present

## 2023-11-02 DIAGNOSIS — K220 Achalasia of cardia: Secondary | ICD-10-CM | POA: Diagnosis not present

## 2023-11-08 DIAGNOSIS — K219 Gastro-esophageal reflux disease without esophagitis: Secondary | ICD-10-CM | POA: Diagnosis not present

## 2023-11-08 DIAGNOSIS — I10 Essential (primary) hypertension: Secondary | ICD-10-CM | POA: Diagnosis not present

## 2023-11-08 DIAGNOSIS — L02211 Cutaneous abscess of abdominal wall: Secondary | ICD-10-CM | POA: Diagnosis not present

## 2023-11-08 DIAGNOSIS — E871 Hypo-osmolality and hyponatremia: Secondary | ICD-10-CM | POA: Diagnosis not present

## 2023-11-08 DIAGNOSIS — E119 Type 2 diabetes mellitus without complications: Secondary | ICD-10-CM | POA: Diagnosis not present

## 2023-11-08 DIAGNOSIS — Z556 Problems related to health literacy: Secondary | ICD-10-CM | POA: Diagnosis not present

## 2023-11-08 DIAGNOSIS — L02221 Furuncle of abdominal wall: Secondary | ICD-10-CM | POA: Diagnosis not present

## 2023-11-08 DIAGNOSIS — E43 Unspecified severe protein-calorie malnutrition: Secondary | ICD-10-CM | POA: Diagnosis not present

## 2023-11-08 DIAGNOSIS — K220 Achalasia of cardia: Secondary | ICD-10-CM | POA: Diagnosis not present

## 2023-11-08 DIAGNOSIS — K9422 Gastrostomy infection: Secondary | ICD-10-CM | POA: Diagnosis not present

## 2023-11-09 DIAGNOSIS — I10 Essential (primary) hypertension: Secondary | ICD-10-CM | POA: Diagnosis not present

## 2023-11-09 DIAGNOSIS — E119 Type 2 diabetes mellitus without complications: Secondary | ICD-10-CM | POA: Diagnosis not present

## 2023-11-09 DIAGNOSIS — K219 Gastro-esophageal reflux disease without esophagitis: Secondary | ICD-10-CM | POA: Diagnosis not present

## 2023-11-09 DIAGNOSIS — Z556 Problems related to health literacy: Secondary | ICD-10-CM | POA: Diagnosis not present

## 2023-11-09 DIAGNOSIS — K9422 Gastrostomy infection: Secondary | ICD-10-CM | POA: Diagnosis not present

## 2023-11-09 DIAGNOSIS — L02221 Furuncle of abdominal wall: Secondary | ICD-10-CM | POA: Diagnosis not present

## 2023-11-09 DIAGNOSIS — E43 Unspecified severe protein-calorie malnutrition: Secondary | ICD-10-CM | POA: Diagnosis not present

## 2023-11-09 DIAGNOSIS — K220 Achalasia of cardia: Secondary | ICD-10-CM | POA: Diagnosis not present

## 2023-11-09 DIAGNOSIS — E871 Hypo-osmolality and hyponatremia: Secondary | ICD-10-CM | POA: Diagnosis not present

## 2023-11-09 DIAGNOSIS — L02211 Cutaneous abscess of abdominal wall: Secondary | ICD-10-CM | POA: Diagnosis not present

## 2023-11-11 DIAGNOSIS — K219 Gastro-esophageal reflux disease without esophagitis: Secondary | ICD-10-CM | POA: Diagnosis not present

## 2023-11-11 DIAGNOSIS — Z556 Problems related to health literacy: Secondary | ICD-10-CM | POA: Diagnosis not present

## 2023-11-11 DIAGNOSIS — E43 Unspecified severe protein-calorie malnutrition: Secondary | ICD-10-CM | POA: Diagnosis not present

## 2023-11-11 DIAGNOSIS — I10 Essential (primary) hypertension: Secondary | ICD-10-CM | POA: Diagnosis not present

## 2023-11-11 DIAGNOSIS — K220 Achalasia of cardia: Secondary | ICD-10-CM | POA: Diagnosis not present

## 2023-11-11 DIAGNOSIS — K9422 Gastrostomy infection: Secondary | ICD-10-CM | POA: Diagnosis not present

## 2023-11-11 DIAGNOSIS — E871 Hypo-osmolality and hyponatremia: Secondary | ICD-10-CM | POA: Diagnosis not present

## 2023-11-11 DIAGNOSIS — L02221 Furuncle of abdominal wall: Secondary | ICD-10-CM | POA: Diagnosis not present

## 2023-11-11 DIAGNOSIS — E119 Type 2 diabetes mellitus without complications: Secondary | ICD-10-CM | POA: Diagnosis not present

## 2023-11-11 DIAGNOSIS — L02211 Cutaneous abscess of abdominal wall: Secondary | ICD-10-CM | POA: Diagnosis not present

## 2023-11-14 DIAGNOSIS — R768 Other specified abnormal immunological findings in serum: Secondary | ICD-10-CM | POA: Diagnosis not present

## 2023-11-14 DIAGNOSIS — K220 Achalasia of cardia: Secondary | ICD-10-CM | POA: Diagnosis not present

## 2023-11-14 DIAGNOSIS — R1311 Dysphagia, oral phase: Secondary | ICD-10-CM | POA: Diagnosis not present

## 2023-11-14 DIAGNOSIS — Z0001 Encounter for general adult medical examination with abnormal findings: Secondary | ICD-10-CM | POA: Diagnosis not present

## 2023-11-14 DIAGNOSIS — E1159 Type 2 diabetes mellitus with other circulatory complications: Secondary | ICD-10-CM | POA: Diagnosis not present

## 2023-11-14 DIAGNOSIS — E871 Hypo-osmolality and hyponatremia: Secondary | ICD-10-CM | POA: Diagnosis not present

## 2023-11-14 DIAGNOSIS — K219 Gastro-esophageal reflux disease without esophagitis: Secondary | ICD-10-CM | POA: Diagnosis not present

## 2023-11-14 DIAGNOSIS — H2513 Age-related nuclear cataract, bilateral: Secondary | ICD-10-CM | POA: Diagnosis not present

## 2023-11-14 DIAGNOSIS — E8809 Other disorders of plasma-protein metabolism, not elsewhere classified: Secondary | ICD-10-CM | POA: Diagnosis not present

## 2023-11-14 DIAGNOSIS — E43 Unspecified severe protein-calorie malnutrition: Secondary | ICD-10-CM | POA: Diagnosis not present

## 2023-11-14 DIAGNOSIS — I152 Hypertension secondary to endocrine disorders: Secondary | ICD-10-CM | POA: Diagnosis not present

## 2023-11-14 DIAGNOSIS — Z931 Gastrostomy status: Secondary | ICD-10-CM | POA: Diagnosis not present

## 2023-11-17 DIAGNOSIS — L02211 Cutaneous abscess of abdominal wall: Secondary | ICD-10-CM | POA: Diagnosis not present

## 2023-11-17 DIAGNOSIS — E43 Unspecified severe protein-calorie malnutrition: Secondary | ICD-10-CM | POA: Diagnosis not present

## 2023-11-17 DIAGNOSIS — K9422 Gastrostomy infection: Secondary | ICD-10-CM | POA: Diagnosis not present

## 2023-11-17 DIAGNOSIS — K219 Gastro-esophageal reflux disease without esophagitis: Secondary | ICD-10-CM | POA: Diagnosis not present

## 2023-11-17 DIAGNOSIS — L02221 Furuncle of abdominal wall: Secondary | ICD-10-CM | POA: Diagnosis not present

## 2023-11-17 DIAGNOSIS — I10 Essential (primary) hypertension: Secondary | ICD-10-CM | POA: Diagnosis not present

## 2023-11-17 DIAGNOSIS — Z556 Problems related to health literacy: Secondary | ICD-10-CM | POA: Diagnosis not present

## 2023-11-17 DIAGNOSIS — E119 Type 2 diabetes mellitus without complications: Secondary | ICD-10-CM | POA: Diagnosis not present

## 2023-11-17 DIAGNOSIS — E871 Hypo-osmolality and hyponatremia: Secondary | ICD-10-CM | POA: Diagnosis not present

## 2023-11-17 DIAGNOSIS — K220 Achalasia of cardia: Secondary | ICD-10-CM | POA: Diagnosis not present

## 2023-11-23 DIAGNOSIS — K9422 Gastrostomy infection: Secondary | ICD-10-CM | POA: Diagnosis not present

## 2023-11-23 DIAGNOSIS — E119 Type 2 diabetes mellitus without complications: Secondary | ICD-10-CM | POA: Diagnosis not present

## 2023-11-23 DIAGNOSIS — E871 Hypo-osmolality and hyponatremia: Secondary | ICD-10-CM | POA: Diagnosis not present

## 2023-11-23 DIAGNOSIS — E43 Unspecified severe protein-calorie malnutrition: Secondary | ICD-10-CM | POA: Diagnosis not present

## 2023-11-23 DIAGNOSIS — I10 Essential (primary) hypertension: Secondary | ICD-10-CM | POA: Diagnosis not present

## 2023-11-23 DIAGNOSIS — K219 Gastro-esophageal reflux disease without esophagitis: Secondary | ICD-10-CM | POA: Diagnosis not present

## 2023-11-23 DIAGNOSIS — Z556 Problems related to health literacy: Secondary | ICD-10-CM | POA: Diagnosis not present

## 2023-11-23 DIAGNOSIS — L02211 Cutaneous abscess of abdominal wall: Secondary | ICD-10-CM | POA: Diagnosis not present

## 2023-11-23 DIAGNOSIS — L02221 Furuncle of abdominal wall: Secondary | ICD-10-CM | POA: Diagnosis not present

## 2023-11-23 DIAGNOSIS — K220 Achalasia of cardia: Secondary | ICD-10-CM | POA: Diagnosis not present

## 2023-11-25 DIAGNOSIS — L02211 Cutaneous abscess of abdominal wall: Secondary | ICD-10-CM | POA: Diagnosis not present

## 2023-11-25 DIAGNOSIS — K220 Achalasia of cardia: Secondary | ICD-10-CM | POA: Diagnosis not present

## 2023-11-25 DIAGNOSIS — E43 Unspecified severe protein-calorie malnutrition: Secondary | ICD-10-CM | POA: Diagnosis not present

## 2023-11-25 DIAGNOSIS — E119 Type 2 diabetes mellitus without complications: Secondary | ICD-10-CM | POA: Diagnosis not present

## 2023-11-25 DIAGNOSIS — E871 Hypo-osmolality and hyponatremia: Secondary | ICD-10-CM | POA: Diagnosis not present

## 2023-11-25 DIAGNOSIS — L02221 Furuncle of abdominal wall: Secondary | ICD-10-CM | POA: Diagnosis not present

## 2023-11-25 DIAGNOSIS — K219 Gastro-esophageal reflux disease without esophagitis: Secondary | ICD-10-CM | POA: Diagnosis not present

## 2023-11-25 DIAGNOSIS — Z556 Problems related to health literacy: Secondary | ICD-10-CM | POA: Diagnosis not present

## 2023-11-25 DIAGNOSIS — I10 Essential (primary) hypertension: Secondary | ICD-10-CM | POA: Diagnosis not present

## 2023-11-25 DIAGNOSIS — K9422 Gastrostomy infection: Secondary | ICD-10-CM | POA: Diagnosis not present

## 2023-12-08 DIAGNOSIS — E119 Type 2 diabetes mellitus without complications: Secondary | ICD-10-CM | POA: Diagnosis not present

## 2023-12-08 DIAGNOSIS — K9422 Gastrostomy infection: Secondary | ICD-10-CM | POA: Diagnosis not present

## 2023-12-08 DIAGNOSIS — E871 Hypo-osmolality and hyponatremia: Secondary | ICD-10-CM | POA: Diagnosis not present

## 2023-12-08 DIAGNOSIS — E43 Unspecified severe protein-calorie malnutrition: Secondary | ICD-10-CM | POA: Diagnosis not present

## 2023-12-08 DIAGNOSIS — L02221 Furuncle of abdominal wall: Secondary | ICD-10-CM | POA: Diagnosis not present

## 2023-12-08 DIAGNOSIS — K220 Achalasia of cardia: Secondary | ICD-10-CM | POA: Diagnosis not present

## 2023-12-08 DIAGNOSIS — Z556 Problems related to health literacy: Secondary | ICD-10-CM | POA: Diagnosis not present

## 2023-12-08 DIAGNOSIS — K219 Gastro-esophageal reflux disease without esophagitis: Secondary | ICD-10-CM | POA: Diagnosis not present

## 2023-12-08 DIAGNOSIS — I10 Essential (primary) hypertension: Secondary | ICD-10-CM | POA: Diagnosis not present

## 2023-12-08 DIAGNOSIS — L02211 Cutaneous abscess of abdominal wall: Secondary | ICD-10-CM | POA: Diagnosis not present

## 2023-12-14 DIAGNOSIS — M419 Scoliosis, unspecified: Secondary | ICD-10-CM | POA: Insufficient documentation

## 2023-12-17 DIAGNOSIS — E871 Hypo-osmolality and hyponatremia: Secondary | ICD-10-CM | POA: Diagnosis not present

## 2023-12-17 DIAGNOSIS — R1310 Dysphagia, unspecified: Secondary | ICD-10-CM | POA: Diagnosis not present

## 2023-12-17 DIAGNOSIS — E8809 Other disorders of plasma-protein metabolism, not elsewhere classified: Secondary | ICD-10-CM | POA: Diagnosis not present

## 2023-12-17 DIAGNOSIS — R634 Abnormal weight loss: Secondary | ICD-10-CM | POA: Diagnosis not present

## 2023-12-17 DIAGNOSIS — E43 Unspecified severe protein-calorie malnutrition: Secondary | ICD-10-CM | POA: Diagnosis not present

## 2023-12-19 ENCOUNTER — Ambulatory Visit: Admitting: Orthopedic Surgery

## 2023-12-19 ENCOUNTER — Encounter: Payer: Self-pay | Admitting: Orthopedic Surgery

## 2023-12-19 DIAGNOSIS — Z8739 Personal history of other diseases of the musculoskeletal system and connective tissue: Secondary | ICD-10-CM | POA: Diagnosis not present

## 2023-12-19 DIAGNOSIS — M75101 Unspecified rotator cuff tear or rupture of right shoulder, not specified as traumatic: Secondary | ICD-10-CM

## 2023-12-19 DIAGNOSIS — M12811 Other specific arthropathies, not elsewhere classified, right shoulder: Secondary | ICD-10-CM

## 2023-12-19 DIAGNOSIS — M25511 Pain in right shoulder: Secondary | ICD-10-CM | POA: Diagnosis not present

## 2023-12-19 MED ORDER — METHYLPREDNISOLONE ACETATE 40 MG/ML IJ SUSP
40.0000 mg | Freq: Once | INTRAMUSCULAR | Status: AC
  Administered 2023-12-19: 40 mg via INTRA_ARTICULAR

## 2023-12-19 NOTE — Progress Notes (Signed)
 SABRA

## 2023-12-19 NOTE — Progress Notes (Signed)
 Chief Complaint  Patient presents with   Injections    Right shoulder/ last one in 2022 helped a lot     This is an 83 year old female in May 2022 for right shoulder pain she did have multiple visits for rotator cuff tear right shoulder she presents now requesting her injection but she needs to have a reevaluation  Chronic pain in her right shoulder with loss of motion  Has increasing difficulties with activities of daily living   She is a small framed lady  She has difficulty with ambulation, we went out to the car to get her in wheelchair and  She has 10 degrees of external rotation of her right shoulder passively abduction 45 degrees both with pain and crepitation  Weakness in abduction  Nerve function is normal  Good pulses and perfusion color and capillary refill right arm  X-rays done in May 2022 showed proximal migration of the humeral head chronic rotator cuff insufficiency  \I think it is prudent to go ahead and give her an injection in the subacromial space  Encounter Diagnoses  Name Primary?   H/O rotator cuff syndrome Yes   Pain in joint of right shoulder    Rotator cuff tear arthropathy of right shoulder      Procedure note the subacromial injection shoulder RIGHT    Verbal consent was obtained to inject the  RIGHT   Shoulder  Timeout was completed to confirm the injection site is a subacromial space of the  RIGHT  shoulder   Medication used Depo-Medrol  40 mg and lidocaine  1% 3 cc  Anesthesia was provided by ethyl chloride  The injection was performed in the RIGHT  posterior subacromial space. After pinning the skin with alcohol and anesthetized the skin with ethyl chloride the subacromial space was injected using a 20-gauge needle. There were no complications  Sterile dressing was applied.

## 2024-01-17 DIAGNOSIS — E43 Unspecified severe protein-calorie malnutrition: Secondary | ICD-10-CM | POA: Diagnosis not present

## 2024-01-17 DIAGNOSIS — E8809 Other disorders of plasma-protein metabolism, not elsewhere classified: Secondary | ICD-10-CM | POA: Diagnosis not present

## 2024-01-17 DIAGNOSIS — R1310 Dysphagia, unspecified: Secondary | ICD-10-CM | POA: Diagnosis not present

## 2024-01-17 DIAGNOSIS — R634 Abnormal weight loss: Secondary | ICD-10-CM | POA: Diagnosis not present

## 2024-01-17 DIAGNOSIS — E871 Hypo-osmolality and hyponatremia: Secondary | ICD-10-CM | POA: Diagnosis not present

## 2024-02-16 DIAGNOSIS — R634 Abnormal weight loss: Secondary | ICD-10-CM | POA: Diagnosis not present

## 2024-02-16 DIAGNOSIS — E43 Unspecified severe protein-calorie malnutrition: Secondary | ICD-10-CM | POA: Diagnosis not present

## 2024-02-16 DIAGNOSIS — R1310 Dysphagia, unspecified: Secondary | ICD-10-CM | POA: Diagnosis not present

## 2024-02-16 DIAGNOSIS — E8809 Other disorders of plasma-protein metabolism, not elsewhere classified: Secondary | ICD-10-CM | POA: Diagnosis not present

## 2024-02-16 DIAGNOSIS — E871 Hypo-osmolality and hyponatremia: Secondary | ICD-10-CM | POA: Diagnosis not present
